# Patient Record
Sex: Female | Born: 1935 | Race: White | Hispanic: No | Smoking: Former smoker
Health system: Southern US, Community
[De-identification: ages and names within clinical notes are randomized; demographics above are authoritative.]

## PROBLEM LIST (undated history)

## (undated) DIAGNOSIS — K219 Gastro-esophageal reflux disease without esophagitis: Secondary | ICD-10-CM

## (undated) DIAGNOSIS — E785 Hyperlipidemia, unspecified: Secondary | ICD-10-CM

## (undated) DIAGNOSIS — F32A Depression, unspecified: Secondary | ICD-10-CM

## (undated) DIAGNOSIS — M1990 Unspecified osteoarthritis, unspecified site: Secondary | ICD-10-CM

## (undated) DIAGNOSIS — F329 Major depressive disorder, single episode, unspecified: Secondary | ICD-10-CM

## (undated) DIAGNOSIS — M8580 Other specified disorders of bone density and structure, unspecified site: Secondary | ICD-10-CM

## (undated) HISTORY — DX: Unspecified osteoarthritis, unspecified site: M19.90

## (undated) HISTORY — DX: Other specified disorders of bone density and structure, unspecified site: M85.80

## (undated) HISTORY — DX: Major depressive disorder, single episode, unspecified: F32.9

## (undated) HISTORY — DX: Hyperlipidemia, unspecified: E78.5

## (undated) HISTORY — DX: Depression, unspecified: F32.A

## (undated) HISTORY — DX: Gastro-esophageal reflux disease without esophagitis: K21.9

---
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
## 1987-08-19 HISTORY — PX: FINGER SURGERY: SHX640

## 1998-04-30 ENCOUNTER — Other Ambulatory Visit: Admission: RE | Admit: 1998-04-30 | Discharge: 1998-04-30 | Payer: Self-pay | Admitting: Family Medicine

## 1998-06-11 ENCOUNTER — Ambulatory Visit (HOSPITAL_COMMUNITY): Admission: RE | Admit: 1998-06-11 | Discharge: 1998-06-11 | Payer: Self-pay | Admitting: *Deleted

## 1998-06-29 ENCOUNTER — Ambulatory Visit (HOSPITAL_COMMUNITY): Admission: RE | Admit: 1998-06-29 | Discharge: 1998-06-29 | Payer: Self-pay | Admitting: *Deleted

## 1998-12-19 ENCOUNTER — Ambulatory Visit (HOSPITAL_COMMUNITY): Admission: RE | Admit: 1998-12-19 | Discharge: 1998-12-19 | Payer: Self-pay | Admitting: *Deleted

## 1999-08-27 ENCOUNTER — Encounter: Payer: Self-pay | Admitting: Family Medicine

## 1999-08-27 ENCOUNTER — Encounter: Admission: RE | Admit: 1999-08-27 | Discharge: 1999-08-27 | Payer: Self-pay | Admitting: Family Medicine

## 2000-08-27 ENCOUNTER — Encounter: Admission: RE | Admit: 2000-08-27 | Discharge: 2000-08-27 | Payer: Self-pay | Admitting: Family Medicine

## 2000-08-27 ENCOUNTER — Encounter: Payer: Self-pay | Admitting: Family Medicine

## 2001-02-24 ENCOUNTER — Ambulatory Visit (HOSPITAL_COMMUNITY): Admission: RE | Admit: 2001-02-24 | Discharge: 2001-02-24 | Payer: Self-pay | Admitting: *Deleted

## 2001-02-24 ENCOUNTER — Encounter (INDEPENDENT_AMBULATORY_CARE_PROVIDER_SITE_OTHER): Payer: Self-pay | Admitting: Specialist

## 2001-09-01 ENCOUNTER — Other Ambulatory Visit: Admission: RE | Admit: 2001-09-01 | Discharge: 2001-09-01 | Payer: Self-pay | Admitting: Family Medicine

## 2001-09-08 ENCOUNTER — Encounter: Admission: RE | Admit: 2001-09-08 | Discharge: 2001-09-08 | Payer: Self-pay | Admitting: Family Medicine

## 2001-09-08 ENCOUNTER — Encounter: Payer: Self-pay | Admitting: Family Medicine

## 2002-08-18 HISTORY — PX: CERVICAL FUSION: SHX112

## 2002-09-02 ENCOUNTER — Other Ambulatory Visit: Admission: RE | Admit: 2002-09-02 | Discharge: 2002-09-02 | Payer: Self-pay | Admitting: Family Medicine

## 2002-09-20 ENCOUNTER — Encounter: Admission: RE | Admit: 2002-09-20 | Discharge: 2002-09-20 | Payer: Self-pay | Admitting: Family Medicine

## 2002-09-20 ENCOUNTER — Encounter: Payer: Self-pay | Admitting: Family Medicine

## 2004-01-24 ENCOUNTER — Encounter: Admission: RE | Admit: 2004-01-24 | Discharge: 2004-01-24 | Payer: Self-pay | Admitting: Family Medicine

## 2005-02-28 ENCOUNTER — Encounter: Admission: RE | Admit: 2005-02-28 | Discharge: 2005-02-28 | Payer: Self-pay | Admitting: Family Medicine

## 2005-07-07 ENCOUNTER — Encounter: Admission: RE | Admit: 2005-07-07 | Discharge: 2005-07-07 | Payer: Self-pay | Admitting: Specialist

## 2005-07-08 ENCOUNTER — Inpatient Hospital Stay (HOSPITAL_COMMUNITY): Admission: RE | Admit: 2005-07-08 | Discharge: 2005-07-10 | Payer: Self-pay | Admitting: Specialist

## 2006-02-10 ENCOUNTER — Ambulatory Visit (HOSPITAL_COMMUNITY): Admission: RE | Admit: 2006-02-10 | Discharge: 2006-02-10 | Payer: Self-pay | Admitting: Specialist

## 2006-02-10 ENCOUNTER — Encounter (INDEPENDENT_AMBULATORY_CARE_PROVIDER_SITE_OTHER): Payer: Self-pay | Admitting: *Deleted

## 2006-03-02 ENCOUNTER — Encounter: Admission: RE | Admit: 2006-03-02 | Discharge: 2006-03-02 | Payer: Self-pay | Admitting: Family Medicine

## 2006-04-02 ENCOUNTER — Encounter: Admission: RE | Admit: 2006-04-02 | Discharge: 2006-04-02 | Payer: Self-pay | Admitting: Family Medicine

## 2006-06-16 ENCOUNTER — Encounter: Admission: RE | Admit: 2006-06-16 | Discharge: 2006-06-16 | Payer: Self-pay | Admitting: Family Medicine

## 2006-12-17 ENCOUNTER — Encounter: Admission: RE | Admit: 2006-12-17 | Discharge: 2007-01-13 | Payer: Self-pay | Admitting: Internal Medicine

## 2007-08-19 HISTORY — PX: SPINE SURGERY: SHX786

## 2007-08-20 ENCOUNTER — Ambulatory Visit: Payer: Self-pay | Admitting: Surgery

## 2007-08-20 ENCOUNTER — Ambulatory Visit (HOSPITAL_COMMUNITY): Admission: RE | Admit: 2007-08-20 | Discharge: 2007-08-20 | Payer: Self-pay | Admitting: Family Medicine

## 2007-11-04 ENCOUNTER — Encounter: Admission: RE | Admit: 2007-11-04 | Discharge: 2007-11-04 | Payer: Self-pay | Admitting: Specialist

## 2007-12-21 ENCOUNTER — Ambulatory Visit: Payer: Self-pay | Admitting: Pulmonary Disease

## 2007-12-21 ENCOUNTER — Inpatient Hospital Stay (HOSPITAL_COMMUNITY): Admission: RE | Admit: 2007-12-21 | Discharge: 2007-12-28 | Payer: Self-pay | Admitting: Specialist

## 2007-12-22 ENCOUNTER — Encounter (INDEPENDENT_AMBULATORY_CARE_PROVIDER_SITE_OTHER): Payer: Self-pay | Admitting: Specialist

## 2008-06-09 ENCOUNTER — Encounter: Admission: RE | Admit: 2008-06-09 | Discharge: 2008-06-09 | Payer: Self-pay | Admitting: Family Medicine

## 2008-07-17 ENCOUNTER — Encounter (INDEPENDENT_AMBULATORY_CARE_PROVIDER_SITE_OTHER): Payer: Self-pay | Admitting: Family Medicine

## 2008-07-17 ENCOUNTER — Ambulatory Visit (HOSPITAL_COMMUNITY): Admission: RE | Admit: 2008-07-17 | Discharge: 2008-07-17 | Payer: Self-pay | Admitting: Family Medicine

## 2008-07-17 ENCOUNTER — Ambulatory Visit: Payer: Self-pay | Admitting: Surgery

## 2008-08-22 ENCOUNTER — Encounter: Admission: RE | Admit: 2008-08-22 | Discharge: 2008-11-20 | Payer: Self-pay | Admitting: Specialist

## 2009-01-16 ENCOUNTER — Encounter: Admission: RE | Admit: 2009-01-16 | Discharge: 2009-01-16 | Payer: Self-pay | Admitting: Family Medicine

## 2009-08-01 ENCOUNTER — Encounter: Admission: RE | Admit: 2009-08-01 | Discharge: 2009-08-01 | Payer: Self-pay | Admitting: Family Medicine

## 2009-08-01 LAB — HM MAMMOGRAPHY: HM Mammogram: NORMAL

## 2010-06-12 HISTORY — PX: COLON SURGERY: SHX602

## 2010-06-12 LAB — HM COLONOSCOPY: HM Colonoscopy: NORMAL

## 2010-06-26 ENCOUNTER — Encounter: Admission: RE | Admit: 2010-06-26 | Discharge: 2010-06-26 | Payer: Self-pay | Admitting: Internal Medicine

## 2010-07-29 IMAGING — MG MM DIGITAL SCREENING BILAT W/ CAD
4 series · 4 of 4 positions shown · non-contrast
Comparison: none

DG SCREEN MAMMOGRAM BILATERAL
Bilateral CC and MLO view(s) were taken.
Technologist: Prajwal Traylor

DIGITAL SCREENING MAMMOGRAM WITH CAD:
The breast tissue is heterogeneously dense.  No masses or malignant type calcifications are 
identified.  Compared with prior studies.

[R CC]
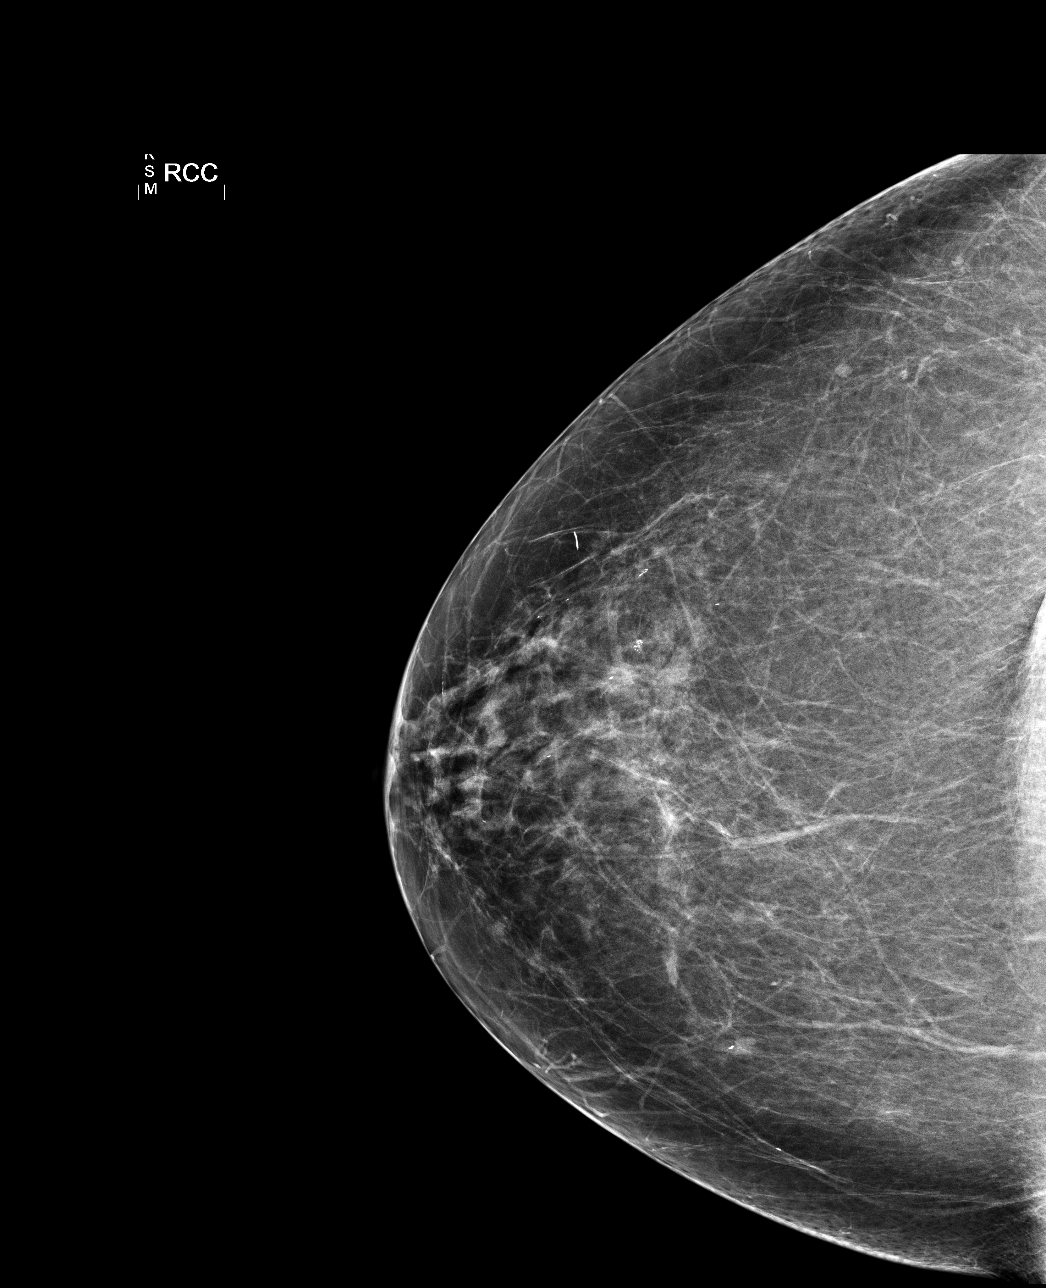

[L CC]
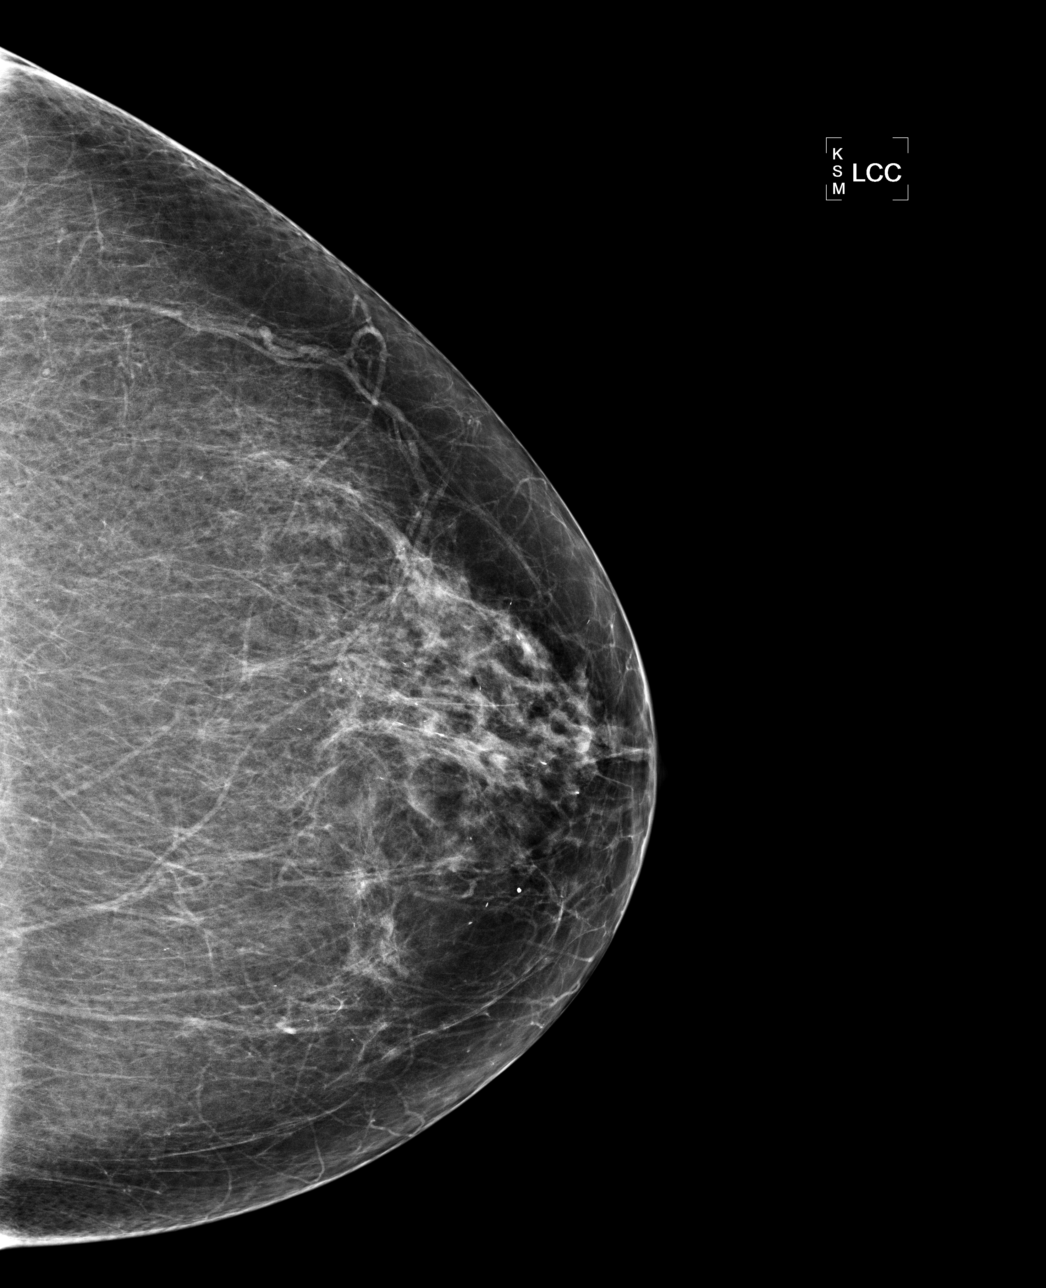

[L MLO]
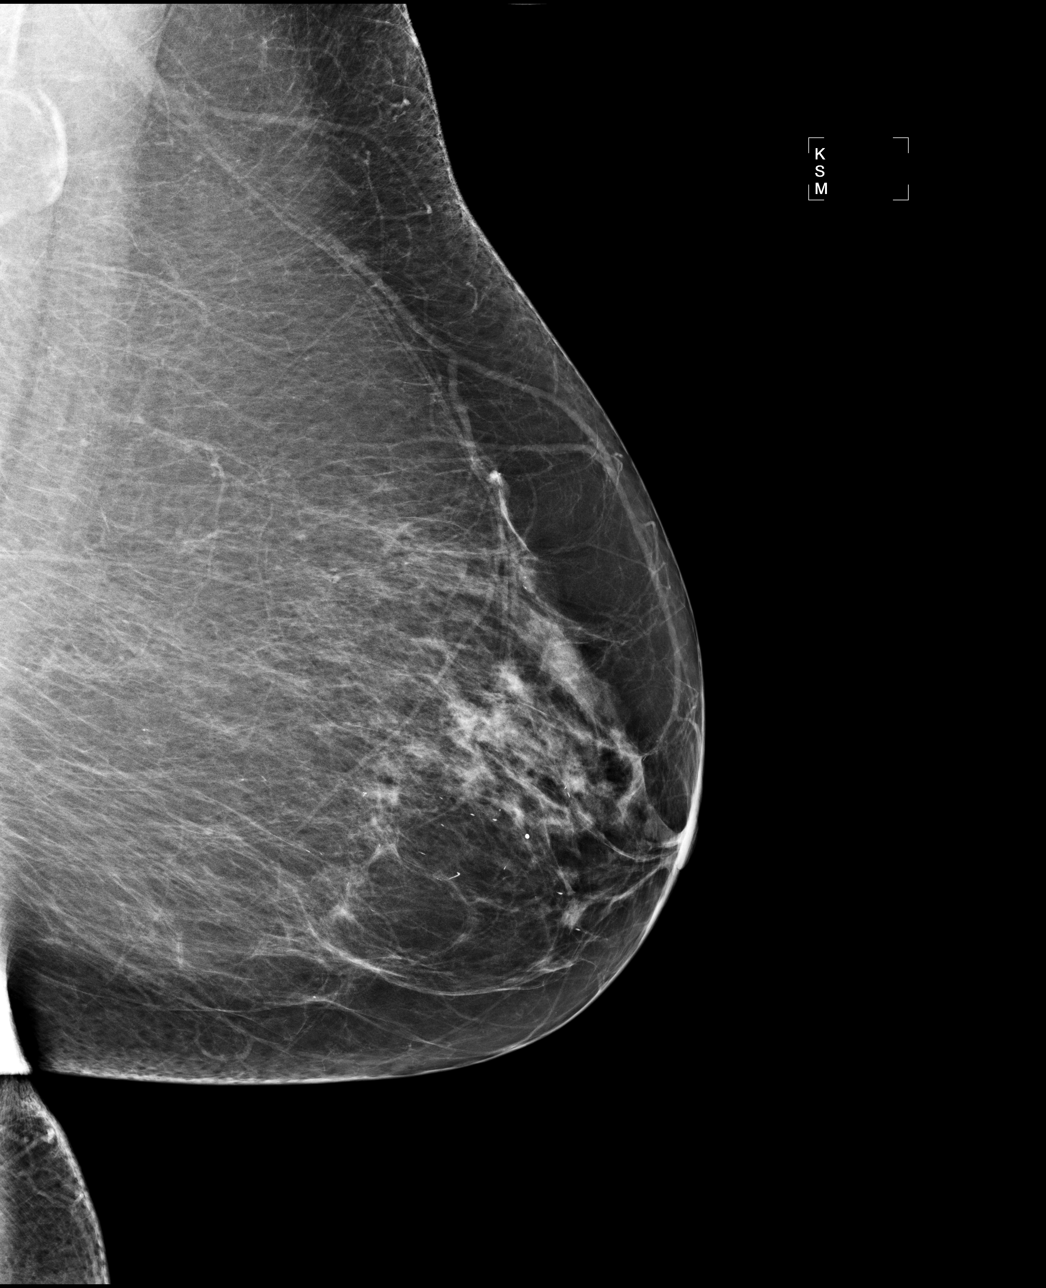

[R MLO]
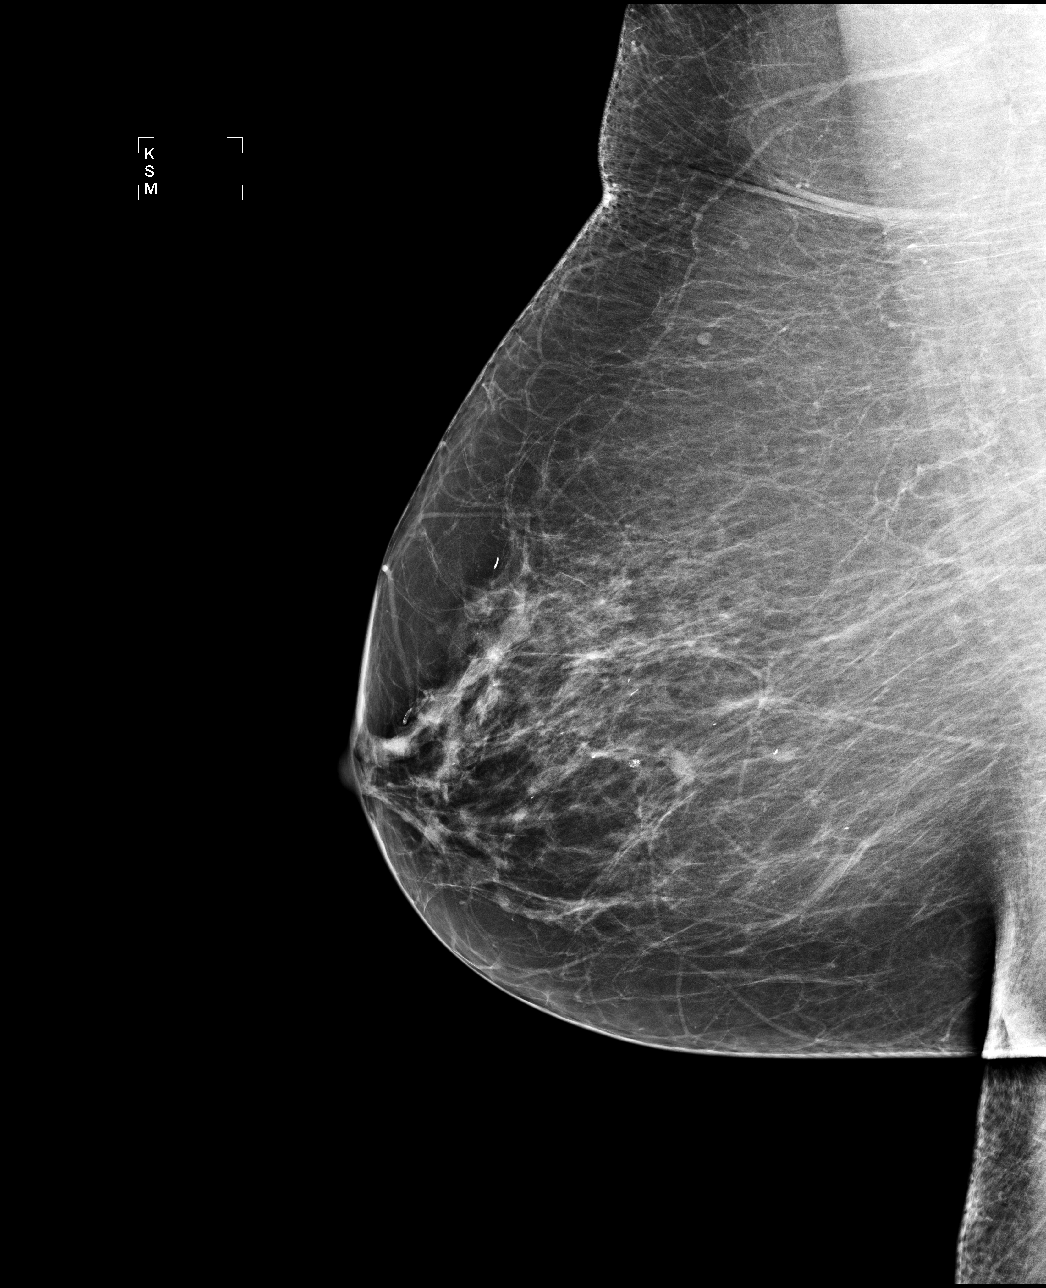

[4 of 4 positions shown; findings below may reference images not displayed]

IMPRESSION: No specific mammographic evidence of malignancy.  Next screening mammogram is recommended in one 
year.

ASSESSMENT: Negative - BI-RADS 1

Screening mammogram in 1 year.
ANALYZED BY COMPUTER AIDED DETECTION. , THIS PROCEDURE WAS A DIGITAL MAMMOGRAM.

## 2010-09-09 ENCOUNTER — Other Ambulatory Visit: Payer: Self-pay | Admitting: Internal Medicine

## 2010-09-09 DIAGNOSIS — Z1239 Encounter for other screening for malignant neoplasm of breast: Secondary | ICD-10-CM

## 2010-09-19 ENCOUNTER — Ambulatory Visit
Admission: RE | Admit: 2010-09-19 | Discharge: 2010-09-19 | Disposition: A | Discharge status: Home / Self Care | Payer: Self-pay | Source: Ambulatory Visit | Attending: *Deleted | Admitting: *Deleted

## 2010-09-19 DIAGNOSIS — Z1239 Encounter for other screening for malignant neoplasm of breast: Secondary | ICD-10-CM

## 2010-12-06 ENCOUNTER — Other Ambulatory Visit: Payer: Self-pay | Admitting: Family Medicine

## 2010-12-06 DIAGNOSIS — Z1239 Encounter for other screening for malignant neoplasm of breast: Secondary | ICD-10-CM

## 2010-12-31 NOTE — Op Note (Signed)
NAMELARENA, OHNEMUS                  ACCOUNT NO.:  [id number]   MEDICAL RECORD NO.:  [id number]          PATIENT TYPE:  INP   LOCATION:  3103                         FACILITY:  [facility]   PHYSICIAN:  Kerrin Champagne, M.D.   DATE OF BIRTH:  1927/01/29   DATE OF PROCEDURE:  12/21/2007  DATE OF DISCHARGE:                               OPERATIVE REPORT   PREOPERATIVE DIAGNOSES:  Lumbar spinal stenosis, neurogenic claudication  secondary to foraminal entrapment right side greater than left, L5-S1  with degenerative spondylolisthesis, right-sided lateral recess stenosis  L4-L5 with degenerative spondylolisthesis.   POSTOPERATIVE DIAGNOSIS:  Lumbar spinal stenosis, neurogenic  claudication secondary to foraminal entrapment right side greater than  left, L5-S1 with degenerative spondylolisthesis, right-sided lateral  recess stenosis L4-L5 with degenerative spondylolisthesis.   PROCEDURES:  Central decompressive laminectomy at L4-5 and L5-S1,  transforaminal lumbar interbody fusion, right side L4-L5 with 9-mm  Concorde cage and local bone graft transforaminal lumbar interbody  fusion, right side L5-S1 with 8-mm cage and local bone graft.  Both  cages were lordotic Concorde cages.  Posterolateral fusion at L4 to S1  using combination with local bone graft and Vitoss material.  With  instrumentation utilizing the DePuy Monarch pedicle screws and rods from  L4 to S1 two segments.  Left L5 bone marrow aspirate.   SURGEON:  Kerrin Champagne, MD   ASSISTANT:  Wende Neighbors, PA-C   ANESTHESIA:  General via orotracheal intubation by Dr. Arta Bruce,  local infiltration with Marcaine 10 mL with 1:200,000 epinephrine.   SPECIMENS:  None.   ESTIMATED BLOOD LOSS:  600 mL.   RETURNED BLOOD:  Two units of cell-saver blood greater than 60%  hematocrit.   COMPLICATIONS:  None.   DRAINS:  Foley is straight drain, Hemovac x1, right lower lumbar spine.  Intraoperative neuromonitoring indicating soft  tissue resistant at 18 on  the left L4 level.  This is a level that previously had broach of the  transverse process, so with initial entry point and likely this resulted  in decreased soft tissue resistance.  All the remaining screws were  greater than 21.  Intraoperative C-arm evaluation demonstrated all  screws in good position and alignment.   HISTORY OF PRESENT ILLNESS:  A 77 year old female who has been  experiencing increasing neurogenic claudication with standing,  ambulation, unable to stand any length of time, greater than 5-10  minutes, unable to walk any distance greater than 50 to 100 feet,  persistence of pain in her back, radiation into her legs.  She has  attempted therapies.  She has attempted medications without relief of  pain.  She wishes to improve her current quality of life.  She has  undergone preoperative evaluation and was felt to be adequate candidate  for surgical intervention.  Intraoperative findings as above.   DESCRIPTION OF PROCEDURE:  After adequate general anesthesia, the  patient had Foley catheter placed.  She had intraoperative  neuromonitoring leads placed.  She was then placed in a prone position  using the [place] spinal frame.  She had standard prep with  DuraPrep  solution, was draped in the usual manner.  Standard preoperative  antibiotics with vancomycin for penicillin allergy.   The patient had PAS stockings, both lower extremities to prevent  intraoperative deep venous thrombosis.  The patient's initial incision  was made and inspected at L3 level extending to the sacrum at S2 in the  midline through the skin or subcutaneous layers using a 10 blade scalpel  after infiltration with Marcaine 0.5%  with 1:200,000 epinephrine.  The  incision was carried sharply through the skin and subcu layers directly  down to the lumbodorsal fascia.  This was then incised in the midline  over the spinous process of L3, L4, L5, S1, and S2.  Cobb elevators  were  then used to elevate her lumbar muscles both sides off the posterior  elements extending from L3 to sacrum and S2 on both sides.  Bleeders  controlled using electrocautery and bipolar electrocautery.  The  exposure continued out over the lateral aspects of the facets of the L3,  L4, L5, and at the sacral level exposing the sacral ala bilaterally, the  transverse process at L5 and at L4, and also continuing up exposing the  facet at the L2-L3 level in order to allow for adequate lateral exposure  for placement of pedicle screws.   Intraoperative radiographs obtained with clamps on this spinous process  of S1 and of L5.  The clamps were radiographically noted to be on the L5  and the L4 spinous processes.  These areas were marked for continued  identification.  The lateral gutters were packed with sponges following  the exposure of the transverse processes of each level L4, L5, and S1  bilaterally.   Leksell rongeur then used to excise the spinous process of L5 and of L4,  inferior 40% of the spinous process of L3.  The central portions of the  lamina were then carefully pinned using Leksell rongeur.  Osteotomes  were then used to perform partial facetectomies on the left side,  complete facetectomies on the right side of L5-S1 level and L4-L5 level  on the right side, a complete facetectomy and on the left side partial  facetectomy.  This bone was later preserved for bone grafting purposes.  Central laminectomy was then performed using 3 and 4-mm Kerrison.  Loupe  magnification and a head lamp was used.  Care was taken to protect dural  sac during this portion of the procedure.  The central portion of some  lamina at L5 were excised up to the L4-L5 level, and this was then  carefully freed off from the thecal sac and the remaining portion of the  neural arch resected at the L4-L5 level.  This was then continued up  superiorly to the L3-L4 level resecting the central portions of  lamina  of L4 up to the L3-L4 level.  The lateral recesses were then  decompressed using 3 and 4-mm Kerrisons, winding the central  laminectomy.  The pars area was then resected on the right side at the  L4 and the L5 levels, in order to fully decompress the neuroforamen on  the right side, both the L5 nerve root and the L4 nerve roots.   The ligamentum flavum was then resected centrally at L5-S1 and off the  superior aspect of lamina of S1, and then this was then carried out to  the neuroforamen both sides at the S1 level and performing a  foraminotomy both sides of S1, decompressing this nerves, locating  the  medial aspect of the pedicle at the S1 level both sides and on the right  side, continuing the lateral recess decompression up the right side, and  resecting the right side medial portion of superior articular process of  S1.  Remaining portions of the superior aspect of S1 facet was then  resected about the superior portion of the pedicle of S1 on the right  further completing decompression of right L5 nerve root.  Lateral  recesses were decompressed at the L4-L5 level and this was continued  out at the L5 neuroforamen using two 3-mm Kerrisons and osteotomes were  necessary.  Carefully exposing the L5 nerve root and decompressing this,  it  is found to be quite edematous, quite erythematous secondary to  dilatation of vessels.  Following decompression, the nerve root was in  good condition.  The L4 nerve root was decompressed within its  neuroforamen, but resection of the superior articular process of L5, and  the intraarticular process of L4 on the right side after performing  lateral recess decompression.  Distal thecal sac in both L4 and L5 nerve  roots exiting out without further nerve compression and acoustic neural  probe to be easily passed out from the neuroforamen on the left side as  well as the left S1 and the right S1 levels.  This completed the portion  of the  decompression.  The C-am fluoro was then brought into field.  Under C-arm fluoro then a starting awl was then used to make the initial  entry point along the lateral aspect of the intersection of the lateral  portion of pedicle of L4 and the transverse process of L4.  Initial  attempts at placement of an opening into the pedicle were unsuccessful  and was probed laterally.  This required use of a burr then to remove  the small portion in the lateral aspect of the superior articular  process of L4 in order to obtain an entry point of the lateral aspect of  the pedicle at the L4 level.  Then using a handheld awl, we were able to  make an initial entry point and then using pedicle finder probed the  pedicle on the left at the L4 level.  With correct degree of convergence  then, tapping was performed, and this was then checked using a ball-tip  probe to ensure patency of the pedicle.  This proved to be present.  A  35 mm x 6.25 mm screws were then placed on all of the left side.  First,  on the left side at L4 tapping was appropriate using a high-speed bur  then decorticated the transverse process of L4 and then placing a 35 mm  x 6.25 mm screw here.  Next, an awl was used to make initial entry point  at the  intersection of the lateral aspect of the pedicle and the  intersection with the transverse process at L5, and then a handheld  pedicle probe was used to probe the pedicle, and this was done with  correct degree of convergence using C-arm fluoro for guidance.  This was  probed quite easily.  Measure of depth 35-mm length was chosen by 6.25.  Tapping was performed with a 5.5 tap.  Decortication of the transverse  process carried out with ball-tip probe,  used to carefully checked  patency of the opening into the pedicle at L5 on the left side.  Then  appropriate size screw 35 mm x 6.25 mm was  then placed on left side of  L5.  Noted that prior to placement of the screw, the bone marrow   aspirate was carried out using the aspiration equipment for the Vitoss,  a total of 10 mL, a bone marrow was aspirated from left side of L5.  This was applied to the Vitoss material.  Last screws then placed on the  left side of the S1 level using a starter awl at the inferior aspect of  the superior articular process of S1 just lateral to it, checking with a  C-arm fluoro, then using a pedicle finder to probe the pedicle to 30-mm  as there was only one 7.0 x 30-mm screw available.  Tapping was  performed with a 5.5 tap and a 6.25 x 30-mm screw was placed and this  obtained excellent purchase on the left side at S1.  Each of these  pedicle screws then had their fasteners carefully loosened using the  fastener lock breaker provided.     Attention then turned to the right side where similarly screws were  placed in the L4, L5, and S1 levels using a starter awl verifying of C-  arm fluoro.  Then using the pedicle probe, probed the pedicle  appropriately then using ball-tip probe at each level, we carefully  tested the patency of the entry point of the pedicles ensuring their  patency.  A 6.25 x 35-mm screws were placed above L4 and L5 on the right  side after decortication with transverse process with an application of  Vitoss material here.  Final screw placed on the right side of the S1  level.  Again, using a pedicle probe testing the pedicle patency using a  ball-tip probe.  A 30-mm x 7.0 screw was used.  Using a 6.25 tap and  placing appropriate sized screw, correct degree of convergence lordosis  as seen on C-arm.  With this then each of the screws have been placed.  These were then measured for soft tissue resistance at left L4 pedicle  and screw that had the most difficulty with placement that showed soft  tissue resistance of 18, remaining greater than 21.  These were checked  with C-arm fluoro in both AP and lateral planes demonstrating screws in  good position alignment.  With  this done, an irrigation was carried out.  Careful inspection of nerves demonstrated, there is no significant nerve  compression occurring at this point.  A single rod was then carefully  contoured using a 15-mm length, contoured rod that was further curved  for the end of fasteners on the left side.  This was then held in place  with caps at each of the 3 screws.  Right side, that was placed later in  the procedure.  Exposure obtained of the posterior aspect of the disk  space at L5-S1 level using a Penfield #4 mobilizing lateral thecal sac  in the S1 nerve root.  Cauterizing the epidural veins using bipolar  electrocautery.  A 15 blade scalpel used to incise the disk on the right  side, pituitary rongeurs used to debride the disk.  Dilatation of the  disk space carried out after first using an osteotome in order to remove  the small lip over the superior aspect of S1.  A 2 and 3-mm Kerrisons  were resected the small amount of bone and disk material.  An 8 mm  dilator was then used, 9 mm was unable to be turned within the disk  space,  so 8-mm choice of bone graft and a cage was chosen.  The disk  space was further opened using a 9-mm spreader across the spinous  process at L3 extending to the sacrum.  This allowed opening the disk  space, allowed for further debridement using straight curette, left  upbiting right curettes, and ring curettes were all used to debride the  disk space and disk material and cartilaginous end plates.  Pituitary  rongeurs were used to further debride the standard bleeding bone  surfaces.  Finally, a trail was performed using an 8 mm trough by the  excellent fit.  This bone graft that have been previously removed and  morselized was then placed within the intervertebral disk space at the  L5-S1 level.  This was further impacted then using the 8 mm trial  [place]. Bone graft was then placed into the 8 mm lordotic cage, and the  cage then placed in its correct  position converging towards the middle  aspect of the disk space over the central portion of the disk.  This was  then impacted into placed in a correct degree of convergence.  This was  observed on C-arm fluro to be in good position and alignment within the  anterior half of the disk space.  This was then released from the  insertion device.  Irrigation was performed with thrombin-soaked Gelfoam and Surgicel was then placed out lateral in order to control small  bleeders close to the extending L5 nerve root.  Attention then turned to  the right side of the L4-L5 level where again disk space carefully  exposed using Penfield #4 through [place].  Bleeders was  controlled using bipolar electrocautery.  Carefully mobilizing the  thecal sac at the L5 nerve root.  A 15 blade scalpel was used to incise  the disk on the right side, pituitary rongeur was used to debride the  disk material.  The intervertebral disk space was then carefully  debrided of the disk material first and then using curettes with the  laminar spreader then placed between the spinous process of L3 and the  sacrum.  Disk space was able to be debrided off the cartilaginous end-  plates, further degenerative disk material using straight upbiting,  right upbiting, left curettes as well as ring curettes.  Straight  pituitary rongeurs were used to debride the disk space and trailing  first 8 then 9, then a 10 mm trial.  A 10-mm trial could not be  inserted, a 9-mm did appear to be able to be inserted.  Dilatation of  the disk space have been carried out to 10-mm, but only a 9-mm trial was  able to be inserted, so 9 mm cage was chosen, this was packed with local  bone graft tissue and local bone graft was then placed in the  intervertebral disk space along the right side, carefully retracting the  thecal sac and L5 nerve root.  Then bone graft had been packed within  the disk space and the trial.  A 9 mm instrument was then  used to pack  the bone graft, packed 9-mm cage, was then carefully impacted into the  disk space in an oblique angle, the trial centralized.  Observed on the  C-arm fluro to be in good position along the central portions of the  disk space, subset beneath the posterior aspect of the disk space at L4-  L5.  Care was taken to ensure, there was no bone material within  the  spinal canal following the insertion of these cages both L5, S1, and L4-  5.  Hemostasis was obtained using Gelfoam with thrombin-soaked as well  as Surgicel.  This was then removed at the end of the case.  A 65-mm pre-  contoured rod was then carefully further contoured, and placed within  the pedicle screws, fasteners on the right side, and each of the  fastener caps were then placed.  First on the right side of the L4  level, rod was carefully positioned to ensure it extended beyond the  fastener and then the fastener was attached to the rod at 80 foot pounds  on the right.  Compression was obtained between the L4 and L5 pedicle  screw fasteners and the fastener attached to the rod on the right side  at 80 foot pounds.  Similarly, this carried out on the left side,  attaching the rod at the left L4 pedicle screw to the fastener to 80  foot pounds tightening the cap down and compressing between the L4 and  L5 fasteners then tightened the L5 fastener capped to 80 foot pounds.  On the right side, compressing the between the L5 and the S1 fasteners  and tightening the S1 fastener at 80 foot pounds on the right side.  Then performing similarly on the left side tightening and compressing  between the L5 fastener head and the S1 fastener, then tightening the S1  fastener at 80 foot pounds.  Thus completed the instrumentation portion  of the case.  Permanent C-arm images obtained in the AP and lateral  planes demonstrated excellent position and alignment of the screws and  rods, both sides.  Bone graft well placed centrally  above the L4-L5 and  L5-S1.  This portion of the procedure was completed and irrigation was  carried out, soft tissues were debrided, of devitalized tissue and  remaining local bone graft material, which was abandoned, was placed  posterolaterally extending from the transverse process at L4-L5 to the  sacral ala, both sides.  Careful evaluation of the laminectomy side  indicated both L4, L5 excellent nerve root decompression.  Soft tissue  was allowed to fall back in to the place.  A medium Hemovac drain was  placed through the incision on the right  lumbar level.  The soft  tissues reapproximated in the midline over the central alignment along  the defect.  Using #1 Vicryl sutures reapproximated paralumbar muscles  here.  Lumbodorsal fascia was reattached to the spinous process and a  spinous ligament extending from L3 to L2 centrally and at the S1 level  inferiorly.  Lumbodorsal fascia was reapproximated to itself, then the  midline with interrupted figure-of-8 simple sutures of #1 Vicryl.  The  subcu layers approximated with interrupted #1 Vicryl and superficial  layers with interrupted 0 and then 2-0 Vicryl sutures.  Skin closed with  a running subcu stitch of 4-0 Vicryl.  Dermabond was then applied to the  skin edges closing the skin.  A 4 x 4 fixed to the skin with Hypafix  tape.  The patient was then returned to supine position,  reactivated,extubated, returned to recovery room in satisfactory  condition.  All instruments and sponge counts were correct.  Noted,  preoperatively, the patient had Marcaine performed on lower lumbar  spine.  She also underwent standard intraoperative time-out procedure.  Identifying the patient with doctors and assistants present.  Procedure,  TLIF lumbar interbody fusion, right side at L4-L5 and L5-S1  and  decompression.      Kerrin Champagne, M.D.  Electronically Signed     JEN/[id number]  D:  12/21/2007  T:  12/22/2007  Job:  [id number]

## 2010-12-31 NOTE — Consult Note (Signed)
[name], [name]                  ACCOUNT NO.:  [id number]   MEDICAL RECORD NO.:  [id number]          PATIENT TYPE:  INP   LOCATION:  3103                         FACILITY:  [facility]   PHYSICIAN:  [name], M.D.DATE OF BIRTH:  Jan 10, 1927   DATE OF CONSULTATION:  12/21/2007  DATE OF DISCHARGE:                                 CONSULTATION   The patient's PCP is Dr. Dorothe Carter of [place].   REASON FOR CONSULTATION:  Hypotension.   HISTORY OF PRESENT ILLNESS:  The patient is a 77 year old white female  with past medical history of hypertension, glaucoma, and recent DVT  treated from January to March with Coumadin who underwent today a  decompression by Dr. Otelia Carter. The patient preoperative was admitted for  this procedure secondary to lower extremity pain and weakness.  Preoperative labs for this patient noted that the patient had a  hemoglobin of 11 which was done on December 15, 2007 and otherwise normal  labs. During the procedure, she had 600 mL of blood loss, but during the  procedure, they noted that her urine output by Foley had tapered off.  She was started on fluid, and by the time she got out of the OR, she was  noted to be hypotensive with systolic in the 80s. Despite attempts with  IV fluids and blood, her pressure continued dropping. She was started on  IV dopamine. At this point, [name] were consulted. She  initially was on 3 mcg of dopamine but required titration up. By the  time they got to 10, her heart rate had increased into the 150s and  160s. I spoke with Dr. Otelia Carter as well as PACU nurse, and we started  Levophed as well, with a wean up on her Levophed and a wean down on her  dopamine. Attempt was made to completely wean off the dopamine, and her  pressure dropped further. The patient herself was actually fully awake  and alert and post procedure had only received 2.5 of Dilaudid but had  not had this for several hours. Labs were ordered on patient, and  she  was found to have at 1:45 p.m. a hemoglobin of 9.4, six hours later,  cardiac markers were ordered as well, and she was found to have a CPK of  907 and MB of 25.6 but a normal troponin. At this point now, with her  Levophed running at 20 and her dopamine running at 7, blood pressure  check was found to be 66/27. However, this was with a manual cuff. At  this point, I spoke with Dr. Sung Carter of critical care medicine who was  covering for [facility] tonight. After recheck of her blood pressure while I  was speaking with him, her blood pressure had been found to improve with  systolic in the 150s/70s. At this point, she is felt to be slightly more  stable. Dr. Sung Carter agrees that she needs critical care intervention,  especially with IV access. The patient has peripheral IV but no central  line. The patient was then transferred to the ICU for close monitoring.  Upon arrival to the ICU, her Levophed was titrated down to 15, her  dopamine down to 3 mcg. Her pressure is now down into the 120s systolic.  She is shivering quite a bit and complaining of being cold, and her  heart rate is difficult to assess. It appears to because of her  shivering more increased than it actually is and is basically around  close to 90.   The patient subsequent to that, complained of some minor lower back  pain, and other than just being cold and some minor lower back pain, she  is doing okay. She denies any headaches, vision changes, dysphagia. No  chest pain or palpitations. No shortness of breath, wheezing or  coughing. No abdominal pain. No constipation or diarrhea. She has  problems with bilateral lower extremity weakness and numbness. Her  review of systems is otherwise unremarkable.   The patient's past medical history includes:  1. Glaucoma.  2. GERD.  3. Depression.  4. She is status post a cervical laminectomy in the past. She is      status post lumbar decompression today.   MEDICATIONS:  She is  on:  1. Cosopt 1 drop each eye b.i.d.  2. Fluoxetine 20 daily.  3. Vytorin 10/40 p.o. daily.  4. Protonix 40 p.o. daily.  5. Quinapril/hydrochlorothiazide 20/12.5 one-half tablet p.o. daily.  6. Neurontin 100 to 150 mg t.i.d.  7. Xalatan 1 drop b.i.d. daily.   THE PATIENT HAS ALLERGY TO PENICILLIN.   SOCIAL HISTORY:  The patient has no tobacco, alcohol or drug use.   FAMILY HISTORY:  Noncontributory.   PHYSICAL EXAMINATION:  The patient's vitals currently:  She has a heart  rate of what appears to be around 90, although because of her shivering  is higher than this. Her blood pressure is 127/72, respirations 20,  afebrile.  GENERAL:  She is alert and oriented x3 in no apparent distress.  HEENT:  Normocephalic, atraumatic. Mucous membranes are dry. She has no  carotid bruits.  HEART:  Irregular rate and rhythm, S1 and S2. Mild 2/6 systolic ejection  murmur.  LUNGS:  Decreased breath sounds throughout.  ABDOMEN:  Soft, obese, nontender, positive bowel sounds.  EXTREMITIES:  Show no clubbing or cyanosis. Trace pedal edema. She has  PAS hose on.  She has minimal urine output.   LABORATORY WORK:  Cardiac panel done at 145 shows CPK 907, MB 25.6,  troponin I 0.01. Hemoglobin and hematocrit done at 1:45 a.m. notes a  hemoglobin of 9.4, hematocrit 29. Preoperative screening labs are  essentially unremarkable done on December 15, 2007.   ASSESSMENT AND PLAN:  1. Hypotension. Differential diagnoses of volume loss versus decreased      output from heart failure versus respiratory system impairment,      possible pulmonary embolism, versus anesthesia. She is currently on      Levophed and dopamine. I suspect that this is multifactorial and      probably is from anesthesia which is started to wear off in      addition to volume loss. She is on Levophed and dopamine. We will      wean off the dopamine, continue IV fluids, check labs and chest x-      ray, CCM to see for central line  placement and further pressor      recommendations.  2. History of pulmonary embolism. She only received three months of      treatment, and this is an outside possibility,  but with the timing,      I do not think this is necessarily related to a pulmonary embolus.  3. Glaucoma. Continue her eye drops.  4. Hypertension, history of. Holding her medications.  5. Depression. Continue medications.  6. Status post lumbar decompression.      [name], M.D.  Electronically Signed     SKK/MEDQ  D:  12/21/2007  T:  12/21/2007  Job:  [id number]   cc:   Kerrin Champagne, M.D.  Jethro Bastos, M.D.

## 2011-01-03 NOTE — Op Note (Signed)
[name], [name]                  ACCOUNT NO.:  [id number]   MEDICAL RECORD NO.:  [id number]          PATIENT TYPE:  INP   LOCATION:  NA                           FACILITY:  [facility]   PHYSICIAN:  Kerrin Champagne, M.D.   DATE OF BIRTH:  02/12/1927   DATE OF PROCEDURE:  07/08/2005  DATE OF DISCHARGE:                                 OPERATIVE REPORT   PREOPERATIVE DIAGNOSIS:  Central cervical stenosis C3-4 and C4-5 with  minimal gliosis changes. Anterior listhesis at the C3-4 level. Bilateral  upper extremity radiculopathies in a C5 distribution with biceps weakness  and shoulder abduction weakness.   POSTOPERATIVE DIAGNOSIS:  Central cervical stenosis C3-4 and C4-5 with  minimal gliosis changes. Anterior listhesis at the C3-4 level. Bilateral  upper extremity radiculopathies in a C5 distribution with biceps weakness  and shoulder abduction weakness.   PROCEDURE:  Anterior cervical diskectomy and fusion with decompression of  the cervical canal and C3-4 and C4-5 with right iliac crest bone graft and  allograft fibula bone graft composite x2 8 mm height with 37 mm plate  fixation and 14 mm screws in both the C4 and C5 levels, 12 mm screws at the  C3 level.   SURGEON:  Kerrin Champagne, M.D.   ASSISTANT:  Maud Deed, [facility].   ANESTHESIA:  General via oral tracheal intubation with Dr. Sampson Goon.   SPECIMENS:  None.   ESTIMATED BLOOD LOSS:  150 mL.   COMPLICATIONS:  None.   DRAIN:  A 7-French TLS drain anterior neck. Foley to straight drain.   BRIEF CLINICAL HISTORY:  This patient is a 77 year old female who has been  experiencing increasing numbness both upper extremities, increasing weakness  in both arms with feeling of the arms being heavy. Presented to  Dr. Cindee Salt with concerns of carpal tunnel syndrome.  Radiographs demonstrated  anterior listhesis of C3 on C4 and MRI study subsequently demonstrated  cervical stenosis at both C3-4 and C4-5, greater than other segments  with  some findings consistent with early gliosis. She is brought to the operating  room to undergo anterior diskectomy and fusion at both the C3-4 and C4-5  levels for cervical stenosis with early myelopathic changes seen on MRI  study. She also has findings of disk degeneration and uncinate process  hypertrophy at the C5-6 level, however, this does not appear to cause any  significant cervical stenosis. The patient is brought to surgery for  decompression of cervical stenosis primarily.   INTRAOPERATIVE FINDINGS:  Central canal stenosis C3-4 and C4-5.   DESCRIPTION OF PROCEDURE:  After adequate general anesthesia, the patient in  beach-chair position, a bump under the right iliac crest, the neck in slight  extension with a towel rolled and placed between her shoulders. The [place]  horseshoe was used, well-padded, to hold her head in place. The slides were  used for both forearms padding the elbows appropriately and all soft tissue  areas appropriately. She had bilateral TED hose placed to prevent DVT.  Standard prep with DuraPrep solution over the anterior neck and right iliac,  dried draped in the usual manner, iodine Vi-Drape was used. Incision over  the left neck at the expected C4 level through skin and subcutaneous layers  in line with this patient's skin creases, the incision approximately 4.5 cm  in length. This was carried through skin and subcu layers down to the  platysma layer. Bleeders controlled using electrocautery. Platysma layer  then incised with  electrocautery. The deep fascia then spread using Mayo  scissors. The omohyoid muscle identified and then divided using Mayo  scissors. The interval between the trachea and esophagus medially and  carotid sheath laterally then developed bluntly using finger dissection to  the anterior aspect of cervical spine and the C4-C3 levels.   Kitner dissector was then used to carefully tease the prevertebral fascia to  the midline.  Electrocautery then used to incise the fascia along the medial  border of the longus colli muscle and this was then teased across midline.  Needles were then placed at the expected C3-4 and C4-5 levels.  Intraoperative radiograph demonstrated the needles at the C2-3 and C3-4  levels. Based on this, the upper needle was removed. Then under direct  visualization using the handheld retractors, as with the first needle  removal, the second needle was removed; however, the second needle at the C3-  4 level was removed and a portion of disk excised using a 15 blade scalpel  for continued identification of this level. Then returning to anterior  aspect of the cervical spine exposure of the lowest segment beneath the C3-4  level identified was then made using a 15 blade scalpel.   The 15 blade scalpel was used to incise the disc anteriorly. Pituitary  rongeur then used to excise the disk. Anteriorly for continued  identification of the C4-5 level as well. Medial border of the longus colli  muscle then carefully freed up using electrocautery both sides. Then a Boss  retractor was placed, the medial border of the retractor placed beneath the  medial border of the longus colli muscle on both sides, distraction and  exposing the C4-5 segment initially.   The 14 mm screw posts were then placed in the vertebral body at the C5 and  C4 levels. Distraction obtained across the disk space under Loupe  magnification headlamp then high-speed bur was then used to remove the  inferior end plate of Z6-1 and the superior end plate of C5 back to the  posterior lip osteophytes and posterior cortex of the C5-C4 vertebral bodies  at the level of the disk space and about 2-3 mL superior and inferior to the  disk space. Next, the bur was then used carefully thin the posterior lip  osteophytes and these were then resected with 1 mm Kerrisons both superior to the disk space and end plate area as well as the inferior to  it across an  area that was less stenotic, decompressing the central canal back to the  posterior longitudinal ligament. Next, further the edges of the vertebral  bodies were further decompressed using 1 mm Kerrison over the posterior lip  that remained. And then the foraminotomies performed on the right side and  left side of the C5 level decompressing the C5 nerve roots out to the area  where the nerve root was proceeding anteriorly. This decompressed the nerve  roots quite nicely. The nerve roots were noted to be exiting anteriorly well  beyond the foramen at this point. Height of the intervertebral disk space  was then measured using a  sounder of 8 mm. Depth measuring approximately 18  mm. Right iliac crest bone graft harvest site was exposed while radiograph  was being developed with the cervical spine for localization, an incision  was made over the right iliac crest after infiltration with Marcaine 0.5%  with 1:200,000 epinephrine. Incision through skin and subcu layers carried  down through the superficial portion of the right iliac crest  anterolaterally about four fingerbreadths posterior to the anterior superior  iliac spine.  Electrocautery then used to perform subperiosteal dissection  exposing the superior, medial and lateral aspects of the iliac crest  carefully retracting the structures, then a 8 mm dual oscillating saw was  used to incise the graft from the crest. In the process of then removing  this by cutting across it base, the graft noted to be entirely too small in  its length.  Therefore a second portion of the graft was taken.  The quality  the graft was felt to be significantly osteoporotic difficult to work with.  An using the 8 mm dual oscillating saw, the graft width appeared to be too  narrow as well. Decision was made to use a composite graft with fibula  material that would allow for an excellent strut of the disk space as well  as area for placement  cancellous bone graft for effusion technique. In  essence using the fibular graft as a strut with a consistency of like that  of a cage. The 8 mL fibula was chosen. This was packed with cancellus  material obtained from the bone graft harvested from the right iliac crest.  This was then impacted into place over the anterior aspect cervical spine at  the C4-5 level. Distraction was then released by removing the C5 screw post,  coating the area here for hemostasis with bone wax. The Boss retractor was  then removed and then returned to the C3-4 level placed the medial border  longus colli muscle on both sides exposing the anterior aspect of the  cervical spine. A 15 blade scalpel used to incise further the disk that had  already been incised previously at C3-4 level. Pituitary rongeurs used to  debride the disk space, 3 mm Kerrison used to remove the anterior lip  osteophytes. The operating room microscope was draped brought into the field with the first level at the C4-5 level after initial debridement of the  anterior to thirds of the disk space. It was used for specifically excising  the posterior lip osteophytes as well as performing foraminotomies. This  microscope was used for the entire resection at the C3-4 level of the end  plates of I6-9 both the superior and inferior end plates all way back to the  posterior lip osteophytes and resected posterior lip osteophytes and  performing foraminotomy, using a high-speed bur for most of the procedure, 1  mm of 2 mm Kerrisons for foraminotomy and resection of the posterior lip  osteophytes and bone bridge here. With this then decompression of spinal  canal was complete at the C3-4 level. Additional fibula graft was chosen  after 8 mm sounder demonstrated that this filled the disk space quite nicely  and adequate height. Cancellus bone graft obtained from the right iliac  crest was used to fill the open portion of the fibula graft site. Graft  was  then impacted into place at the C3-4 level. Both screw posts were then  removed at C3 and C4 levels. Allowing for the distraction be removed.  Excellent  compression across both disc spaces noted. The length was then  measured using spread bayonet and ruler. A 37 mm plate eventually was  chosen. This was placed across the anterior aspect of cervical spine, 5  pound cervical halter traction was released. Initial stabilizing pen was  placed at the C4 level and the Lois screws were replaced first on the left  sided C5, fixing the plate in good sagittal alignment. The second screw on  the right side at  C5. These were 14 mm screws. Then the right side C4 screw  was placed again using 14 mm screw. Without difficulty. Retaining pin was  then removed and left C4 level. A 14 mm screw placed at this segment. At the  upper segment a 12 mm screw was used and this was used on the left side  first drilling with a 14 mm drill and then placing the 12 mm screw. On right-  sided C3, drilling with the 12 mL screw and then placing a additional screw  on the right side having to place a revision screw at that point. Following  this, it was felt that adequate compression and end plating was obtained  adequate fixation. Additional bone graft was placed at the C3-4, C4-5 levels  to further reinforce the graft that was already present using bone graft  from the iliac crest on the right side.  Intraoperative radiograph obtained  demonstrating plates and screws in good position alignment.   The patient then had a 7-French drain placed in the neck exiting out the  right side anteriorly. This was sewn in place with a 4-0 nylon suture. The  patient had a closure of her platysma layer using interrupted 3-0 Vicryl  sutures. Note that the omohyoid muscle was approximated with two 3-0 Vicryl  sutures. Then the subcu layers approximated with interrupted 3-0 Vicryl  sutures. Skin was closed with a running subcu stitch  of 4-0 Vicryl suture and Steri-Strips applied. A 4x4 fixed to the skin with Hypafix tape. The  patient had charging of the TLS drain. Right iliac crest bone graft harvest  site was carefully irrigated. Bone wax applied to bleeding cancellous bone  surfaces then Gelfoam placed. The fascial layer of the lower abdomen was  approximated to the upper thigh with interrupted #1 Vicryl sutures, deep  subcu layers approximated interrupted #1 and 0 Vicryl sutures, the more  superficial layers with interrupted 2-0 Vicryl sutures and the skin closed  with running subcu stitch of 4-0 Vicryl. Tincture of benzoin and Steri-  Strips applied. The patient then had placement of 4x4s fixed to the skin  with Hypafix tape. A Aspen collar was applied to the neck. The patient was  then reactivated, extubated and returned recovery room in satisfactory  condition. Note that radiographs intraoperatively did demonstrate excellent  position alignment of the plate and bone grafts without evidence  retropulsion of either. The patient should be noted to have had locking each  of the screws to the plate using locking deformation of the locking and  mechanism. This done without difficulty.      Kerrin Champagne, M.D.  Electronically Signed     JEN/MEDQ  D:  07/08/2005  T:  07/09/2005  Job:  [id number]

## 2011-01-03 NOTE — Discharge Summary (Signed)
[name], [name]                  ACCOUNT NO.:  [id number]   MEDICAL RECORD NO.:  [id number]          PATIENT TYPE:  INP   LOCATION:  5039                         FACILITY:  [facility]   PHYSICIAN:  Kerrin Champagne, M.D.   DATE OF BIRTH:  [birth year]   DATE OF ADMISSION:  07/08/2005  DATE OF DISCHARGE:  07/10/2005                                 DISCHARGE SUMMARY   ADMISSION DIAGNOSES:  1.  Central cervical stenosis C3-4 and C4-5 with minimal gliosis changes.      Anterior listhesis at the C3-4 level. Bilateral upper extremity      radiculopathy in the C5 distribution with biceps weakness and shoulder      abduction weakness.  2.  Hypertension.  3.  Hyperlipidemia.  4.  Glaucoma.  5.  Depression.  6.  Psoriasis.   DISCHARGE DIAGNOSES:  1.  Central cervical stenosis C3-4 and C4-5 with minimal gliosis changes.      Anterior listhesis at the C3-4 level. Bilateral upper extremity      radiculopathy in the C5 distribution with biceps weakness and shoulder      abduction weakness.  2.  Hypertension.  3.  Hyperlipidemia.  4.  Glaucoma.  5.  Depression.  6.  Psoriasis.  7.  Urinary retention, resolved at discharge.   PROCEDURE:  On July 08, 2005, the patient underwent anterior cervical  diskectomy and fusion with decompression of the cervical canal at C3-4 and  C4-5 with right iliac crest bone graft and allograft fibular bone graft  composite. This performed by Dr. Otelia Sergeant, assisted by Maud Deed, P.A.C.,  under general anesthesia.   CONSULTATIONS:  Dr. Jarold Motto of urology.   BRIEF HISTORY:  The patient is a 77 year old female experiencing increasing  numbness bilateral upper extremities as well as weakness in both arms and  feeling of the arms being heavy. Radiographs demonstrated anterior listhesis  of C3 on C4 with MRI studies demonstrating cervical stenosis at both C3-4  and C4-5 greater than the other segments with some findings consistent with  early gliosis. It was felt  she would require operative intervention and was  admitted for the procedure as stated above.   BRIEF HOSPITAL COURSE:  The patient tolerated the procedure under general  anesthesia without difficulties. Postoperatively, she was initially treated  with IV analgesics and weaned to p.o. analgesics without difficulty.  Physical therapy assisted the patient with ambulation and she was noted to  be independent with ambulation. Occupational therapy assisted with ADLs and  she tolerated this well. Drain was removed from her neck on the second  postoperative day. At that time, dressing change was done and there was no  drainage. Unfortunately, she did have some areas of superficial ecchymosis  secondary to the preoperative scrub and I[place]an drapes. She was also noted to  be very sensitive to the tape and therefore dressing was left off of the  anterior neck. The wound of the right hip from the iliac crest bone graft  was doing well without drainage or erythema. A light dressing was placed in  this area. The  patient's Foley catheter was discontinued and she was able to  void without difficulty. Postoperatively, she was placed in a Philadelphia  collar and changed to an Aspen collar once in PACU. She was instructed in  using the Philadelphia collar for showering purposes and the Aspen collar at  all other times.   On the second postoperative day, the patient developed a urinary retention.  Dr. Jarold Motto was called for consultation. Postvoiding residuals were  performed. Dr. Jarold Motto had recommended beginning Flomax if her postvoiding  residuals were greater than 100 cc. Fortunately, the patient was able to  begin voiding better and did have less than 100 cc of postvoiding residual.  With this in mind, she was able to be discharged to her home. Her  medications were adjusted prior to discharge and Darvocet was utilized for  pain control.   PERTINENT LABORATORY VALUES:  EKG on admission normal sinus  rhythm, left  axis deviation, poor R-wave progression, cannot rule out anterior infarct  age undetermined with no previous EKGs available; confirmed by Dr. Jacinto Halim. No  chest x-ray on discharge. CBC on admission was within normal limits.  Postoperatively hemoglobin and hematocrit 11.9 and 34.5 respectively.  Coagulation studies on admission with elevated PTT at 44. Chemistry studies  on admission with glucose 112. Repeat BMET postoperatively with sodium 134,  potassium 3.3, glucose 139, remaining values normal. Urinalysis negative for  urinary tract infection. Urine culture on July 10, 2006 showed 1000  colonies of growth with this being an insignificant amounts.   PLAN:  The patient was discharged to her home. She was given instructions  for ambulating with her walker. She will wear her Aspen collar at all times.  She will avoid overhead activities and no lifting. Dressing changes will be  done daily at home. She will be allowed to shower utilizing her [place]  collar.   MEDICATIONS AT DISCHARGE:  Darvocet-N 100 1 every for 6 hours as needed for  pain and she will resume her preoperative home medications. She is  instructed in the use of stool softeners over-the-counter.   The patient will call if she has any further difficulties with urinary  retention. Otherwise we will see her in the office two weeks from the date  of her surgery. She will continue on a regular diet.   CONDITION ON DISCHARGE:  Stable.      Wende Neighbors, P.A.      Kerrin Champagne, M.D.  Electronically Signed    SMV/MEDQ  D:  11/05/2005  T:  11/06/2005  Job:  [id number]

## 2011-01-03 NOTE — Discharge Summary (Signed)
[name], [name]                  ACCOUNT NO.:  [id number]   MEDICAL RECORD NO.:  [id number]          PATIENT TYPE:  INP   LOCATION:  5021                         FACILITY:  [facility]   PHYSICIAN:  Kerrin Champagne, M.D.   DATE OF BIRTH:  [birth year]   DATE OF ADMISSION:  12/21/2007  DATE OF DISCHARGE:  12/28/2007                               DISCHARGE SUMMARY   ADMISSION DIAGNOSES:  1. Lumbar spinal stenosis with neurogenic claudication secondary to      foraminal entrapment, right greater than left L5-S1 with      degenerative spondylolisthesis, right-sided lateral recess stenosis      L4-5 with degenerative spondylolisthesis.  2. Hypertension.  3. Glaucoma.  4. Elevated cholesterol.  5. Psoriasis.  6. Depression.  7. History of deep venous thrombosis, right lower extremity.  8. Status post anterior cervical diskectomy and fusion C3-C4 and C4-      C5.   DISCHARGE DIAGNOSES:  1. Lumbar spinal stenosis with neurogenic claudication secondary to      foraminal entrapment, right greater than left L5-S1 with      degenerative spondylolisthesis, right-sided lateral recess stenosis      L4-5 with degenerative spondylolisthesis.  2. Hypertension.  3. Glaucoma.  4. Elevated cholesterol.  5. Psoriasis.  6. Depression.  7. History of deep venous thrombosis, right lower extremity.  8. Status post anterior cervical diskectomy and fusion C3-C4 and C4-      C5.  9. Posthemorrhagic anemia requiring blood transfusion.  10.Left iliopsoas hematoma.  11.Bilateral pleural effusion.  12.Hypovolemic shock.  13.Hypokalemia.  14.Hypomagnesemia.  15.Hypophosphatemia, all replaced during hospital stay.   PROCEDURE:  On Dec 21, 2007, the patient underwent central decompressive  laminectomy at L4-L5 and L5-S1, transforaminal lumbar interbody fusion  right L4-5 with 9-mm Concorde cage, and local bone graft transforaminal  lumbar interbody fusion, right side L5-S1 with 8-mm cage and local bone  graft.   Posterolateral fusion at L4-S1 using combination of local bone  graft and Vitoss material.  Left L5 bone marrow aspirate via pedicle.  This was performed by Dr. Otelia Sergeant, assisted by Maud Deed, PA-C under  general anesthesia.   CONSULTATIONS:  [facility] hospitalist and by Dr. Delford Field and associated  intensivist.   BRIEF HISTORY:  The patient is a 77 year old female with increasing  neurogenic claudication with standing and ambulation and inability to be  on her feet greater than 5-10 minutes at a time.  She has attempted  physical therapy as well as medication and epidural steroid injections.  She has failed conservative treatment.  She would like to proceed with  surgical intervention.  Risks and benefits of the procedure were  discussed, and the patient was admitted to proceed with the procedure as  stated above.   BRIEF HOSPITAL COURSE:  The patient tolerated the procedure under  general anesthesia.  Intraoperatively, she received 2 units of Cell  Saver blood.  Postoperatively in the post anesthesia care unit, she was  noted to be hypotensive.  Multiple attempts with medications and IV  fluids did not relieve her of the hypotension.  She was taken to the  intensive care unit and continued to be monitored.  The intensivist was  called for consultation as was the patient's primary care physician who  utilized [facility] hospitalist.  Attempted A-line was failed multiple times  and the patient requested no further attempts.  During the stay in the  intensive care unit, she did not receive physical therapy.  She mostly  remained at bedrest.  She remained on clear liquids until her bowel  function returned.  The patient underwent echocardiogram showing  ejection fraction of 75% with no diagnostic evidence of left ventricular  regional wall motion abnormality.  The patient did have an episode of  oversedation with narcotics.  She required Narcan.  She was able to  return to baseline mental  status.  The patient had bilateral pleural  effusion, left greater than right.  She was treated for atelectasis.  She had further drops in her hemoglobin and hematocrit and required  further blood transfusion receiving a total of 4 units of packed red  blood cells during the hospital stay.  Cortisol level was drawn to rule  out adrenal insufficiency.  However, values were never made available as  the lab did not obtain the appropriate specimen.  The patient continued  to have hypotension, which was unexplained.  CT scan of the abdomen did  show a left iliopsoas hematoma.  This was felt to be a possible source  for her hypovolemic shock and hypotension.  The patient eventually was  hemodynamically stable for 48 hours.  Despite her bilateral pleural  effusion, she was saturating well on room air.  She was allowed to  autodiurese as her blood pressure tolerated this well.  For her anemia,  she continued to use supplementation after receiving packed red blood  cells as stated above.  Potassium, magnesium, and phosphorus were  repleted.  The patient was started on Lovenox for DVT prophylaxis as she  had been sedentary for several days while stabilizing hemodynamically.  She also had history of DVT of the lower extremity.  Eventually, she was  stable enough for return to the orthopedic floor, there physical therapy  was initiated.  The patient was treated with an Aspen LSO which she was  allowed to don and doff at bedside and was able to demonstrate  independence with this prior to discharge.  The patient was instructed  in ambulation and gait training and utilize her brace anytime out of bed  as well as a walker.  She made advances with physical therapy ambulating  as much as 150 feet in the hallway.  Occupational therapy assisted with  ADLs.  The patient was independent with these at discharge.  The patient  was comfortable on p.o. analgesics.  Dressing changes were done daily  after the  Hemovac had been discontinued on the first postoperative day.  Her wound showed no drainage throughout the hospital stay and was  healing well.  On Dec 28, 2007, the patient was taking a regular diet.  She was voiding without difficulty and having flatus as well as bowel  movement.  Neurovascular and motor function of the lower extremities was  noted to be intact and she was able to be discharged to her home in  stable condition.   PERTINENT LABORATORY VALUES:  Hemoglobin and hematocrit on December 15, 2007, preoperative values 11.0 and 32.9.  The patient's values varied  throughout the hospital stay requiring multiple transfusions.  She  received a total of  4 units of packed red blood cells during the  hospital stay and at discharge hemoglobin 11.0, hematocrit 32.9.  Chemistry studies on admission were within normal limits and initially  postop were within normal limits.  Potassium at lowest value of 2.9,  value at 3.2 at discharge.  BUN noted to be 3 with creatinine 0.54 at  discharge.  Calcium is 8.3 at discharge.  Total protein at lowest value  4.9, albumin 3.0, magnesium 1.4, phosphorus less than 1.0.  However,  after repletion, magnesium 1.7, phosphorus 8.8.  At discharge, total  protein 4.9, albumin 2.6.  The CK and CK-MB values initially at 907 and  25.6 respectively.  Prior to discharge CK 491, CK-MB 13.9.  Troponin  levels within normal limits with no indication for myocardial injury.  Cortisol level was obtained showing values 22.4.  Urinalysis on  admission negative for urinary tract infection and repeat on May 11,  negative for urinary tract infection.  Urine culture with 10,000  colonies of E-coli, final report on Dec 30, 2007.   PLAN:  Arrangements made for home health physical therapy and  occupational therapy.  The patient to wear her brace at all times when  out of bed and will don and doff the brace at bedside.  No bending,  lifting or twisting.  She will keep her wound  dry and all clean and  replace the dressing daily.  She may shower with the Tegaderm dressing  in place.   FOLLOWUP:  The patient will follow up with Dr. Otelia Sergeant 2 weeks from  surgery.  Arrangements made for office visit.   MEDICATIONS:  1. Cosopt eye drops twice daily.  2. Paroxetine 20 mg daily.  3. Vytorin 10/40 daily.  4. Pantoprazole 40 mg daily.  5. Quinapril/hydrochlorothiazide 20/12.5 1/2 tablet daily.  6. Neurontin 1 to 1-1/2 tablet three times daily.  7. Xalatan eye drops daily.  8. K-Dur 20 mEq daily.  9. Trinsicon twice daily.  10.Celebrex 200 mg daily.  11.Darvocet N 100 1 every 4-6 hours as needed for pain.  12.Robaxin 500 mg one every 8 hours as needed for spasm.   She was advised to call the office should she have fevers or chills and  also if there are any other questions prior to her return office visit.  No dietary restrictions.   CONDITION ON DISCHARGE:  Stable.      Wende Neighbors, P.A.      Kerrin Champagne, M.D.  Electronically Signed    SMV/MEDQ  D:  02/03/2008  T:  02/03/2008  Job:  [id number]

## 2011-01-03 NOTE — Consult Note (Signed)
[name], [name]                  ACCOUNT NO.:  [id number]   MEDICAL RECORD NO.:  [id number]          PATIENT TYPE:  INP   LOCATION:  5039                         FACILITY:  [facility]   PHYSICIAN:  Maretta Bees. Vonita Moss, M.D.DATE OF BIRTH:  07-11-1927   DATE OF CONSULTATION:  07/10/2005  DATE OF DISCHARGE:                                   CONSULTATION   I was asked to see this pleasant 77 year old lady who underwent extensive C-  spine surgery.  She said she has had no voiding troubles preoperatively but  postoperatively after a Foley was removed she has had some difficulty  voiding small amounts of urine at a time.  There is no dysuria or hematuria.  Cathed urine has been sent for UA and culture.   She has a history of hypertension, GERD and glaucoma.   Medications include Tylenol on admission, in addition to __________,  labetalol, Protonix, fluoxetine, timolol and Xalatan.   She is allergic to penicillin.   She does not smoke since she quit 5 years ago.  She does drink some alcohol.   PHYSICAL EXAMINATION:  GENERAL:  She is alert and oriented.  Does not seem  to be in acute distress.  SKIN:  Skin is warm and dry.  NECK:  Neck is in a brace.  ABDOMEN:  No CV or bladder tenderness.  No hepatosplenomegaly.  No hernias.   IMPRESSION:  Postop urinary voiding difficulties with partial retention and  small voidings.   PLAN:  She is ambulatory and we will watch her voiding doing in and out of  cath and we will check her PVR Q shift and PRN.  If her PVR is less than 100  cc, she could be discharged per orthopaedics.  If it is over 100 cc, we will  get her started on Flomax and continue catheterizations as need be.  We will  follow up on her tomorrow unless she goes home today and otherwise we can  see her in the office in followup in a couple of weeks.      Maretta Bees. Vonita Moss, M.D.  Electronically Signed     LJP/MEDQ  D:  07/10/2005  T:  07/10/2005  Job:  [id number]   cc:   Kerrin Champagne, M.D.  Fax: [phone]

## 2011-01-03 NOTE — Op Note (Signed)
NAMEMADGIE, DHALIWAL                  ACCOUNT NO.:  [id number]   MEDICAL RECORD NO.:  [id number]          PATIENT TYPE:  AMB   LOCATION:  DAY                          [facility]ACILITY:  [facility]   PHYSICIAN:  Kerrin Champagne, M.D.   DATE OF BIRTH:  March 19, 1927   DATE OF PROCEDURE:  02/10/2006  DATE OF DISCHARGE:                                 OPERATIVE REPORT   PREOPERATIVE DIAGNOSIS:  Right subcutaneous mass overlying the lateral  aspect of the right shoulder.   POSTOPERATIVE DIAGNOSIS:  Right subcutaneous mass overlying the lateral  aspect of the right shoulder.   FINDINGS:  Consistency clinically of lipoma.   PROCEDURE:  Removal of subcutaneous mass right lateral shoulder,  characteristics of a lipoma.   SURGEON:  Kerrin Champagne, M.D.   ANESTHESIA:  General via oral obturator Dr. Okey Dupre.   ESTIMATED BLOOD LOSS:  10 mL.   DRAINS:  TLS drain 10 French x 1 to red top tube.   HISTORY OF PRESENT ILLNESS:  The patient is a 77 year old right-hand  dominant female whose undergone previous anterior diskectomy and fusion by  myself for severe cervical stenosis.  She complains of a slow-growing mass  over the right lateral shoulders, been told benign the in the past and her  clinical findings are consistent with a subcutaneous lipoma that is large  measuring about 8 cm x 6-7 cm from anterior to posterior as well as superior  to inferior.  She is brought to the operating room to undergo an outpatient  removal of subcutaneous mass presumably lipoma.   INTRAOPERATIVE FINDINGS:  The mass approximately is 8-9 cm by some 7-8 cm by  approximately 3-4 cm. Findings consistency of the a subcutaneous lipoma of  the vascular stalk.  The mass was sent to pathology for histologic and gross  pathology.   DESCRIPTION OF PROCEDURE:  After adequate general anesthesia, the patient in  supine position, the right upper extremity prepped from the wrist to the  axilla, DuraPrep solution, draped in the usual manner.  A  right upper  extremity drape was used.  The patient had an incision longitudinally over  the lateral aspects of right shoulder with ellipsing of a central portion of  skin from the area overlying the mass over the right proximal lateral arm.  The incision carried sharply through skin, subcu areas then developed using  Metzenbaum scissors circumferentially defining the lipoma from the subcu  fatty layers quite easily circumferentially.  Identifying vascular stalk,  clamping and then dividing and then removing the lipoma.  The stock was then  tied off using a suture ligature of 2-0 Vicryl.  Small bleeders controlled  using electrocautery.  There was no active bleeding present from the subcu  area present. A 10-French drain was placed in the depth  of the incision  exiting distally. The subcu layers were then irrigated with copious amounts  of irrigant solution and then closing the dead space using #0 Vicryl sutures  in the subcu area.  The more superficial layers with interrupted 2-0 Vicryl  sutures were approximated. The skin closed  with a running subcu stitch of 4-  0 Vicryl.  Dermabond was then applied to the incision sealing it. 4x4s fixed  to the skin with Hypafix tape after infiltration of the area using Marcaine  1/2% plain 20 mL.  The drain was then fastened to the dressing using pink  tape.  The patient was then reactivated, extubated, returned to the recovery  room in satisfactory condition.  All instrument and sponge counts were  correct.   POSTOPERATIVE CARE:  The patient was alert and oriented. She will be  discharged home with a sling for the right arm. The family will be taught to  change the red top tube as necessary p.r.n. red top tube filled. Will see  him back in the office in 2 days for removal of the drain from the right  arm. For discomfort Vicodin one to two q.4-6 h p.r.n. pain. Placed her on  Keflex 500 mg __________ p.o. q.8 h until the drain is out.      Kerrin Champagne, M.D.  Electronically Signed     JEN/MEDQ  D:  02/10/2006  T:  02/10/2006  Job:  [id number]

## 2011-05-13 LAB — COMPREHENSIVE METABOLIC PANEL
ALT: 21
AST: 17
Albumin: 3.9
Alkaline Phosphatase: 76
BUN: 10
CO2: 28
Calcium: 9.2
Chloride: 108
Creatinine, Ser: 0.75
GFR calc Af Amer: >60
GFR calc non Af Amer: >60
Glucose, Bld: 98
Potassium: 3.8
Sodium: 142
Total Bilirubin: 0.5
Total Protein: 6

## 2011-05-13 LAB — TYPE AND SCREEN
ABO/RH(D): B POS
Antibody Screen: NEGATIVE

## 2011-05-13 LAB — URINALYSIS, ROUTINE W REFLEX MICROSCOPIC
Bilirubin Urine: NEGATIVE
Glucose, UA: NEGATIVE
Hgb urine dipstick: NEGATIVE
Ketones, ur: NEGATIVE
Nitrite: NEGATIVE
Protein, ur: NEGATIVE
Specific Gravity, Urine: 1.022
Urobilinogen, UA: 0.2
pH: 6

## 2011-05-13 LAB — PROTIME-INR
INR: 0.9
Prothrombin Time: 12.5

## 2011-05-13 LAB — URINE MICROSCOPIC-ADD ON

## 2011-05-13 LAB — CBC
HCT: 32.9 — ABNORMAL LOW
Hemoglobin: 11 — ABNORMAL LOW
MCHC: 33.5
MCV: 90.2
Platelets: 294
RBC: 3.65 — ABNORMAL LOW
RDW: 14.5
WBC: 5.8

## 2011-05-13 LAB — DIFFERENTIAL
Basophils Absolute: 0
Basophils Relative: 1
Eosinophils Absolute: 0.2
Eosinophils Relative: 4
Lymphocytes Relative: 31
Lymphs Abs: 1.8
Monocytes Absolute: 0.4
Monocytes Relative: 7
Neutro Abs: 3.3
Neutrophils Relative %: 57

## 2011-05-13 LAB — APTT: aPTT: 42 — ABNORMAL HIGH

## 2011-05-13 LAB — ABO/RH: ABO/RH(D): B POS

## 2011-05-14 LAB — BASIC METABOLIC PANEL
BUN: 10
BUN: 12
BUN: 3 — ABNORMAL LOW
BUN: 4 — ABNORMAL LOW
BUN: 5 — ABNORMAL LOW
BUN: 5 — ABNORMAL LOW
BUN: 6
CO2: 22
CO2: 24
CO2: 25
CO2: 25
CO2: 26
CO2: 28
CO2: 28
Calcium: 7.7 — ABNORMAL LOW
Calcium: 7.8 — ABNORMAL LOW
Calcium: 8.1 — ABNORMAL LOW
Calcium: 8.2 — ABNORMAL LOW
Calcium: 8.3 — ABNORMAL LOW
Calcium: 8.4
Calcium: 8.5
Chloride: 103
Chloride: 106
Chloride: 107
Chloride: 108
Chloride: 109
Chloride: 112
Chloride: 112
Creatinine, Ser: 0.52
Creatinine, Ser: 0.52
Creatinine, Ser: 0.53
Creatinine, Ser: 0.54
Creatinine, Ser: 0.56
Creatinine, Ser: 0.59
Creatinine, Ser: 0.6
GFR calc Af Amer: >60
GFR calc Af Amer: >60
GFR calc Af Amer: >60
GFR calc Af Amer: >60
GFR calc Af Amer: >60
GFR calc Af Amer: >60
GFR calc Af Amer: >60
GFR calc non Af Amer: >60
GFR calc non Af Amer: >60
GFR calc non Af Amer: >60
GFR calc non Af Amer: >60
GFR calc non Af Amer: >60
GFR calc non Af Amer: >60
GFR calc non Af Amer: >60
Glucose, Bld: 101 — ABNORMAL HIGH
Glucose, Bld: 104 — ABNORMAL HIGH
Glucose, Bld: 111 — ABNORMAL HIGH
Glucose, Bld: 133 — ABNORMAL HIGH
Glucose, Bld: 136 — ABNORMAL HIGH
Glucose, Bld: 171 — ABNORMAL HIGH
Glucose, Bld: 176 — ABNORMAL HIGH
Potassium: 2.9 — ABNORMAL LOW
Potassium: 3.2 — ABNORMAL LOW
Potassium: 3.2 — ABNORMAL LOW
Potassium: 3.3 — ABNORMAL LOW
Potassium: 3.5
Potassium: 3.7
Potassium: 5.5 — ABNORMAL HIGH
Sodium: 139
Sodium: 139
Sodium: 140
Sodium: 140
Sodium: 141
Sodium: 141
Sodium: 142

## 2011-05-14 LAB — COMPREHENSIVE METABOLIC PANEL
ALT: 21
ALT: 21
ALT: 27
AST: 21
AST: 29
AST: 33
Albumin: 2.6 — ABNORMAL LOW
Albumin: 3 — ABNORMAL LOW
Albumin: 3 — ABNORMAL LOW
Alkaline Phosphatase: 52
Alkaline Phosphatase: 55
Alkaline Phosphatase: 64
BUN: 14
BUN: 16
BUN: 4 — ABNORMAL LOW
CO2: 24
CO2: 25
CO2: 28
Calcium: 8.3 — ABNORMAL LOW
Calcium: 8.3 — ABNORMAL LOW
Calcium: 8.4
Chloride: 107
Chloride: 107
Chloride: 108
Creatinine, Ser: 0.5
Creatinine, Ser: 0.83
Creatinine, Ser: 0.83
GFR calc Af Amer: >60
GFR calc Af Amer: >60
GFR calc Af Amer: >60
GFR calc non Af Amer: >60
GFR calc non Af Amer: >60
GFR calc non Af Amer: >60
Glucose, Bld: 103 — ABNORMAL HIGH
Glucose, Bld: 178 — ABNORMAL HIGH
Glucose, Bld: 191 — ABNORMAL HIGH
Potassium: 3.2 — ABNORMAL LOW
Potassium: 3.9
Potassium: 4
Sodium: 139
Sodium: 140
Sodium: 143
Total Bilirubin: 0.6
Total Bilirubin: 1
Total Bilirubin: 1
Total Protein: 4.9 — ABNORMAL LOW
Total Protein: 4.9 — ABNORMAL LOW
Total Protein: 5 — ABNORMAL LOW

## 2011-05-14 LAB — BLOOD GAS, ARTERIAL
Acid-base deficit: 2.5 — ABNORMAL HIGH
Acid-base deficit: 5.7 — ABNORMAL HIGH
Acid-base deficit: 7.5 — ABNORMAL HIGH
Acid-base deficit: 8.5 — ABNORMAL HIGH
Bicarbonate: 20.3
Bicarbonate: 20.6
Bicarbonate: 21.1
Bicarbonate: 23.2
Drawn by: 24513
Drawn by: 24513
Drawn by: 265131
FIO2: 1
FIO2: 1
O2 Content: 2
O2 Content: 3
O2 Saturation: 98.5
O2 Saturation: 98.8
O2 Saturation: 99.2
O2 Saturation: 99.6
Patient temperature: 98.6
Patient temperature: 98.6
Patient temperature: 98.6
Patient temperature: 99
TCO2: 22.2
TCO2: 22.2
TCO2: 23.8
TCO2: 24.7
pCO2 arterial: 50.2 — ABNORMAL HIGH
pCO2 arterial: 51.1 — ABNORMAL HIGH
pCO2 arterial: 64.1
pCO2 arterial: 85.6
pH, Arterial: 7.022 — CL
pH, Arterial: 7.128 — CL
pH, Arterial: 7.23 — ABNORMAL LOW
pH, Arterial: 7.286 — ABNORMAL LOW
pO2, Arterial: 114 — ABNORMAL HIGH
pO2, Arterial: 126 — ABNORMAL HIGH
pO2, Arterial: 192 — ABNORMAL HIGH
pO2, Arterial: 213 — ABNORMAL HIGH

## 2011-05-14 LAB — CBC
HCT: 21.5 — ABNORMAL LOW
HCT: 26 — ABNORMAL LOW
HCT: 28.7 — ABNORMAL LOW
HCT: 32.2 — ABNORMAL LOW
HCT: 32.9 — ABNORMAL LOW
HCT: 33.2 — ABNORMAL LOW
HCT: 34.1 — ABNORMAL LOW
Hemoglobin: 10.7 — ABNORMAL LOW
Hemoglobin: 11 — ABNORMAL LOW
Hemoglobin: 11.1 — ABNORMAL LOW
Hemoglobin: 11.6 — ABNORMAL LOW
Hemoglobin: 7.1 — CL
Hemoglobin: 8.6 — ABNORMAL LOW
Hemoglobin: 9.7 — ABNORMAL LOW
MCHC: 32.8
MCHC: 32.9
MCHC: 33.3
MCHC: 33.4
MCHC: 33.5
MCHC: 33.6
MCHC: 33.8
MCV: 87.1
MCV: 87.3
MCV: 87.9
MCV: 89.3
MCV: 89.7
MCV: 89.9
MCV: 90.2
Platelets: 113 — ABNORMAL LOW
Platelets: 116 — ABNORMAL LOW
Platelets: 125 — ABNORMAL LOW
Platelets: 176
Platelets: 178
Platelets: 196
Platelets: 227
RBC: 2.41 — ABNORMAL LOW
RBC: 2.88 — ABNORMAL LOW
RBC: 3.3 — ABNORMAL LOW
RBC: 3.59 — ABNORMAL LOW
RBC: 3.74 — ABNORMAL LOW
RBC: 3.8 — ABNORMAL LOW
RBC: 3.8 — ABNORMAL LOW
RDW: 14.7
RDW: 14.7
RDW: 14.8
RDW: 14.8
RDW: 15
RDW: 15.1
RDW: 15.4
WBC: 15.1 — ABNORMAL HIGH
WBC: 18.3 — ABNORMAL HIGH
WBC: 19.1 — ABNORMAL HIGH
WBC: 8.5
WBC: 8.9
WBC: 9.3
WBC: 9.7

## 2011-05-14 LAB — TROPONIN I: Troponin I: 0.05

## 2011-05-14 LAB — CARDIAC PANEL(CRET KIN+CKTOT+MB+TROPI)
CK, MB: 10.3 — ABNORMAL HIGH
CK, MB: 13.9 — ABNORMAL HIGH
CK, MB: 25.6 — ABNORMAL HIGH
Relative Index: 2.4
Relative Index: 2.8 — ABNORMAL HIGH
Relative Index: 2.8 — ABNORMAL HIGH
Total CK: 425 — ABNORMAL HIGH
Total CK: 491 — ABNORMAL HIGH
Total CK: 907 — ABNORMAL HIGH
Troponin I: 0.01
Troponin I: 0.03
Troponin I: 0.03

## 2011-05-14 LAB — URINALYSIS, ROUTINE W REFLEX MICROSCOPIC
Bilirubin Urine: NEGATIVE
Glucose, UA: NEGATIVE
Hgb urine dipstick: NEGATIVE
Ketones, ur: NEGATIVE
Nitrite: NEGATIVE
Protein, ur: NEGATIVE
Specific Gravity, Urine: 1.004 — ABNORMAL LOW
Urobilinogen, UA: 1
pH: 8

## 2011-05-14 LAB — URINE CULTURE: Colony Count: 10000

## 2011-05-14 LAB — CROSSMATCH
ABO/RH(D): B POS
Antibody Screen: NEGATIVE

## 2011-05-14 LAB — HEMOGLOBIN AND HEMATOCRIT, BLOOD
HCT: 28.7 — ABNORMAL LOW
HCT: 29 — ABNORMAL LOW
HCT: 29.8 — ABNORMAL LOW
HCT: 31.4 — ABNORMAL LOW
Hemoglobin: 10.5 — ABNORMAL LOW
Hemoglobin: 9.4 — ABNORMAL LOW
Hemoglobin: 9.6 — ABNORMAL LOW
Hemoglobin: 9.8 — ABNORMAL LOW

## 2011-05-14 LAB — CK TOTAL AND CKMB (NOT AT ARMC)
CK, MB: 13.2 — ABNORMAL HIGH
Relative Index: 2.4
Total CK: 560 — ABNORMAL HIGH

## 2011-05-14 LAB — MAGNESIUM
Magnesium: 1.4 — ABNORMAL LOW
Magnesium: 1.7
Magnesium: 1.8
Magnesium: 1.9

## 2011-05-14 LAB — PHOSPHORUS
Phosphorus: 1.3 — ABNORMAL LOW
Phosphorus: 2.9
Phosphorus: 4.6
Phosphorus: 8.8 — ABNORMAL HIGH
Phosphorus: <1 — CL

## 2011-05-14 LAB — POTASSIUM: Potassium: 3.1 — ABNORMAL LOW

## 2011-05-14 LAB — PROTIME-INR
INR: 1.1
Prothrombin Time: 14.2

## 2011-05-14 LAB — CORTISOL: Cortisol, Plasma: 22.4

## 2011-09-01 ENCOUNTER — Other Ambulatory Visit: Payer: Self-pay | Admitting: Dermatology

## 2011-09-25 ENCOUNTER — Other Ambulatory Visit: Payer: Self-pay | Admitting: Internal Medicine

## 2011-09-25 DIAGNOSIS — Z1231 Encounter for screening mammogram for malignant neoplasm of breast: Secondary | ICD-10-CM

## 2011-12-08 ENCOUNTER — Ambulatory Visit
Admission: RE | Admit: 2011-12-08 | Discharge: 2011-12-08 | Disposition: A | Discharge status: Home / Self Care | Payer: Medicare Other | Source: Ambulatory Visit | Attending: Internal Medicine | Admitting: Internal Medicine

## 2011-12-08 DIAGNOSIS — Z1231 Encounter for screening mammogram for malignant neoplasm of breast: Secondary | ICD-10-CM

## 2012-08-09 ENCOUNTER — Other Ambulatory Visit: Payer: Self-pay | Admitting: Internal Medicine

## 2012-08-09 DIAGNOSIS — M810 Age-related osteoporosis without current pathological fracture: Secondary | ICD-10-CM

## 2012-08-19 ENCOUNTER — Ambulatory Visit
Admission: RE | Admit: 2012-08-19 | Discharge: 2012-08-19 | Disposition: A | Discharge status: Home / Self Care | Payer: Medicare Other | Source: Ambulatory Visit | Attending: Internal Medicine | Admitting: Internal Medicine

## 2012-08-19 DIAGNOSIS — M810 Age-related osteoporosis without current pathological fracture: Secondary | ICD-10-CM

## 2012-11-16 ENCOUNTER — Other Ambulatory Visit: Payer: Self-pay | Admitting: Geriatric Medicine

## 2012-11-16 ENCOUNTER — Other Ambulatory Visit: Payer: Medicare Other

## 2012-11-16 DIAGNOSIS — I10 Essential (primary) hypertension: Secondary | ICD-10-CM

## 2012-11-16 DIAGNOSIS — R7989 Other specified abnormal findings of blood chemistry: Secondary | ICD-10-CM

## 2012-11-16 DIAGNOSIS — E785 Hyperlipidemia, unspecified: Secondary | ICD-10-CM

## 2012-11-17 LAB — COMPREHENSIVE METABOLIC PANEL
ALT: 21 IU/L (ref 0–32)
AST: 15 IU/L (ref 0–40)
Albumin/Globulin Ratio: 2.1 (ref 1.1–2.5)
Albumin: 4 g/dL (ref 3.5–4.7)
Alkaline Phosphatase: 50 IU/L (ref 39–117)
BUN/Creatinine Ratio: 18 (ref 11–26)
BUN: 12 mg/dL (ref 8–27)
CO2: 30 mmol/L — ABNORMAL HIGH (ref 19–28)
Calcium: 10 mg/dL (ref 8.6–10.2)
Chloride: 104 mmol/L (ref 97–108)
Creatinine, Ser: 0.66 mg/dL (ref 0.57–1.00)
GFR calc Af Amer: 94 mL/min/{1.73_m2} (ref >59)
GFR calc non Af Amer: 82 mL/min/{1.73_m2} (ref >59)
Globulin, Total: 1.9 g/dL (ref 1.5–4.5)
Glucose: 84 mg/dL (ref 65–99)
Potassium: 4.8 mmol/L (ref 3.5–5.2)
Sodium: 144 mmol/L (ref 134–144)
Total Bilirubin: 0.3 mg/dL (ref 0.0–1.2)
Total Protein: 5.9 g/dL — ABNORMAL LOW (ref 6.0–8.5)

## 2012-11-17 LAB — LIPID PANEL
Chol/HDL Ratio: 2.7 ratio units (ref 0.0–4.4)
Cholesterol, Total: 204 mg/dL — ABNORMAL HIGH (ref 100–199)
HDL: 75 mg/dL (ref >39)
LDL Calculated: 79 mg/dL (ref 0–99)
Triglycerides: 251 mg/dL — ABNORMAL HIGH (ref 0–149)
VLDL Cholesterol Cal: 50 mg/dL — ABNORMAL HIGH (ref 5–40)

## 2012-11-17 LAB — HEMOGLOBIN A1C
Est. average glucose Bld gHb Est-mCnc: 134 mg/dL
Hgb A1c MFr Bld: 6.3 % — ABNORMAL HIGH (ref 4.8–5.6)

## 2012-11-18 ENCOUNTER — Encounter: Payer: Self-pay | Admitting: Internal Medicine

## 2012-11-18 ENCOUNTER — Ambulatory Visit (INDEPENDENT_AMBULATORY_CARE_PROVIDER_SITE_OTHER): Payer: Medicare Other | Admitting: Internal Medicine

## 2012-11-18 VITALS — BP 130/80 | HR 90 | Temp 98.2°F | Resp 16 | Wt 134.0 lb

## 2012-11-18 DIAGNOSIS — I10 Essential (primary) hypertension: Secondary | ICD-10-CM | POA: Insufficient documentation

## 2012-11-18 DIAGNOSIS — R739 Hyperglycemia, unspecified: Secondary | ICD-10-CM | POA: Insufficient documentation

## 2012-11-18 DIAGNOSIS — G629 Polyneuropathy, unspecified: Secondary | ICD-10-CM | POA: Insufficient documentation

## 2012-11-18 DIAGNOSIS — E785 Hyperlipidemia, unspecified: Secondary | ICD-10-CM

## 2012-11-18 DIAGNOSIS — M0550 Rheumatoid polyneuropathy with rheumatoid arthritis of unspecified site: Secondary | ICD-10-CM

## 2012-11-18 DIAGNOSIS — E781 Pure hyperglyceridemia: Secondary | ICD-10-CM

## 2012-11-18 DIAGNOSIS — R7309 Other abnormal glucose: Secondary | ICD-10-CM

## 2012-11-18 DIAGNOSIS — G63 Polyneuropathy in diseases classified elsewhere: Secondary | ICD-10-CM

## 2012-11-18 NOTE — Assessment & Plan Note (Signed)
C/o pain in hands and feet.  Not new.  Likely multifactorial as also prediabetic for a long time due to longstanding prednisone.    Will consider gabapentin in future.

## 2012-11-18 NOTE — Progress Notes (Signed)
Patient ID: [name], female   DOB: 01/24/1927, 79 y.o.   MRN: [id number] Code Status:  DNR   Allergies  Allergen Reactions  . Penicillins     Chief Complaint  Patient presents with  . Medical Managment of Chronic Issues    no new problems    HPI: Patient is a 79 y.o. Caucasian female seen in the office today for follow up of her chronic conditions.  Feels fine except her numbness and tingling of her hands and feet. Discussed high triglycerides and prediabetic hba1c which is stable.    Review of Systems:  Review of Systems  Constitutional: Negative for fever, chills and weight loss.  HENT: Negative for congestion.   Eyes: Negative for blurred vision.  Respiratory: Negative for cough and shortness of breath.   Cardiovascular: Negative for chest pain.  Gastrointestinal: Negative for heartburn and constipation.  Genitourinary: Negative for dysuria, urgency and frequency.  Musculoskeletal: Positive for back pain and joint pain. Negative for myalgias and falls.  Skin: Negative for rash.  Neurological: Positive for tingling. Negative for dizziness, focal weakness and headaches.  Endo/Heme/Allergies: Does not bruise/bleed easily.  Psychiatric/Behavioral: Negative for depression and memory loss. The patient is not nervous/anxious.      Past Medical History  Diagnosis Date  . Arthritis     Osteoarthritis  . GERD (gastroesophageal reflux disease)   . Hyperlipidemia   . Depression   . Osteopenia    Past Surgical History  Procedure Laterality Date  . Appendectomy  1936  . Tonsillectomy  1941  . Finger surgery  1989  . Cervical fusion  2004  . Spine surgery  2009    Back Fusion  . Colon surgery  06/12/2010    Colonoscopy   Social History:   reports that she has quit smoking. She does not have any smokeless tobacco history on file. She reports that she does not drink alcohol. Her drug history is not on file.  History reviewed. No pertinent family  history.  Medications: Patient's Medications  New Prescriptions   No medications on file  Previous Medications   ALENDRONATE (FOSAMAX) 70 MG TABLET    Take 70 mg by mouth every 7 (seven) days. Take one tablet by mouth weekly.  Stay upright for 1 hour after taking.   ASPIRIN 81 MG TABLET    Take 81 mg by mouth daily.   CHOLECALCIFEROL (VITAMIN D) 1000 UNITS TABLET    Take 1,000 Units by mouth 2 (two) times daily. Take one tablet twice daily for vitamin D supplement.   GABAPENTIN (NEURONTIN) 100 MG CAPSULE    Take 100 mg by mouth 3 (three) times daily. Take 1 capsule every 8 hours as needed for nerve pain.   LATANOPROST (XALATAN) 0.005 % OPHTHALMIC SOLUTION       LUBIPROSTONE (AMITIZA) 8 MCG CAPSULE    Take 8 mcg by mouth 2 (two) times daily with a meal. Take one capsule twice a day for constipation.   MILNACIPRAN HCL (SAVELLA) 25 MG TABS    Take 25 mg by mouth 2 (two) times daily. Take one tablet twice daily for fibromyalgia.   OXYCODONE-ACETAMINOPHEN (PERCOCET/ROXICET) 5-325 MG PER TABLET    Take 1 tablet by mouth 3 (three) times daily as needed for pain. Take one tablet three times daily as needed for pain.   PANTOPRAZOLE (PROTONIX) 40 MG TABLET    Take 40 mg by mouth daily. Take one tablet once daily for acid reflux.   PREDNISONE (DELTASONE) 5 MG  TABLET       QUINAPRIL-HYDROCHLOROTHIAZIDE (ACCURETIC) 20-12.5 MG PER TABLET    Take 1 tablet by mouth daily. Take one tablet once a day.   SIMVASTATIN (ZOCOR) 20 MG TABLET    Take 20 mg by mouth daily. Take one tablet once daily for cholesterol.   ZOSTER VACCINE LIVE, PF, (ZOSTAVAX) 16109 UNT/0.65ML INJECTION    Inject 0.65 mLs into the skin once. Inject 0.65 mL subcutaneously.  Modified Medications   No medications on file  Discontinued Medications   CALCIUM 1200-1000 MG-UNIT CHEW    Chew 1 tablet by mouth daily. Take one tablet once a day.     Physical Exam:  Filed Vitals:   11/18/12 1026  BP: 130/80  Pulse: 90  Temp: 98.2 F (36.8 C)   TempSrc: Oral  Resp: 16  Weight: 134 lb (60.782 kg)  SpO2: 97%  Physical Exam  Constitutional: She is oriented to person, place, and time. She appears well-developed and well-nourished. No distress.  HENT:  Head: Normocephalic and atraumatic.  Eyes: Pupils are equal, round, and reactive to light.  Cardiovascular: Normal rate, regular rhythm, normal heart sounds and intact distal pulses.  Exam reveals no gallop and no friction rub.   No murmur heard. Pulmonary/Chest: Effort normal and breath sounds normal. No respiratory distress.  Abdominal: Soft. Bowel sounds are normal.  Musculoskeletal: Normal range of motion.  Neurological: She is alert and oriented to person, place, and time. No cranial nerve deficit. Coordination normal.  Skin: Skin is warm and dry. No erythema.  Psychiatric: She has a normal mood and affect. Her behavior is normal. Judgment and thought content normal.   Labs reviewed: Basic Metabolic Panel:  Recent Labs  60/45/40 1029  NA 144  K 4.8  CL 104  CO2 30*  GLUCOSE 84  BUN 12  CREATININE 0.66  CALCIUM 10.0   Liver Function Tests:  Recent Labs  11/16/12 1029  AST 15  ALT 21  ALKPHOS 50  BILITOT 0.3  PROT 5.9*   Lipid Panel:  Recent Labs  11/16/12 1029  HDL 75  LDLCALC 79  TRIG 251*  CHOLHDL 2.7   11/19/11:  hba1c 6.3  Past Procedures: 11/17/22006 Chest X-Ray 3-74mm nodule in the right lower lobe. Elevation of the  right hemidiaphragm with associated left lower lobe atelectasis verus scarring. Borderline heart size. 07/07/2005 CT of the Chest No evidence of neoplastic process in the chest Left adrenal lesion 07/08/2005 X-Ray of the Spine C3-C5 ACDF C5 poorly visualized due to overlying shoulders 03/02/2006 Mammogram Normal  03/24/2000 X-Ray of the Left Hip No acute abnormality with mild osteoarthritis noted 11/04/2007 MR of the C-Spine  Fusion from C-3 to C-5 has satisfactory appearance  Mild facet degeneration at C2-3 left more than right  without definite neural compression  06/16/2006 X-Ray of Right Hand Negative right tibia and fibula  12/15/2007 Chest X-Ray Cardiac enlargement without heart failure No active cardiopulmonary disease  11/04/2007 MR of the Spine  Fusion from C-# to C-5 has a satisfactory appearance  12/21/2007 X-Ray of the lumbar Spine Clamps on the spinous processes of L4 and L5 A sponge is in place in the region  12/21/2007 Chest X-Ray  PICC line tip in the distal SVC Persistent left lower lobe process and streaky areas of the right lung atelectasis 12/22/2007 2D Echo  12/24/2007 CT of the Abdomen  Bilateral pleural effusions left greater right with left base infiltrate Mild third spacing of fluid 12/24/2007 CT of the Pelvis Left iliopsoas muscle  hematoma and adjacent blood as directed above Sigmoid diverticula  06/09/2008 Mammogram Normal  07/17/2008 VL Arterial /Venous No evidence of DVT  01/16/2009 Right Lower extremity Venous Doppler Ultrasound No evidence of right lower extremity deep vein thrombosis The right GSV is patent  08/01/2009 Mammogram Normal   06/12/2010 Colonoscopy Dr Bosie Clos  Normal 06/26/2010 Bone density  Assessment/Plan  Essential hypertension, benign BP is at goal with quinapril/hctz and baby asa.    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  Not new.  Likely multifactorial as also prediabetic for a long time due to longstanding prednisone.    Will consider gabapentin in future.    Hyperglycemia Due to longstanding prednisone for rheumatoid arthritis.    Hyperlipidemia LDL goal < 100 LDL is at goal with current statin therapy, but TG remain high.  Discussed with her in detail her necessary dietary changes including watching sweets and starches that are contributing to this and her elevating hba1c.  Agrees she will work on this.    Hypertriglyceridemia, essential Improved since last time but not yet at goal.  Watch starches and sweets and recheck  before next visit.   Labs/tests ordered:  Cbc, bmp, flp, hba1c before 4 mo f/u.

## 2012-11-18 NOTE — Assessment & Plan Note (Signed)
Improved since last time but not yet at goal.  Watch starches and sweets and recheck before next visit.

## 2012-11-18 NOTE — Assessment & Plan Note (Signed)
Due to longstanding prednisone for rheumatoid arthritis.

## 2012-11-18 NOTE — Assessment & Plan Note (Signed)
LDL is at goal with current statin therapy, but TG remain high.  Discussed with her in detail her necessary dietary changes including watching sweets and starches that are contributing to this and her elevating hba1c.  Agrees she will work on this.

## 2012-11-18 NOTE — Assessment & Plan Note (Signed)
BP is at goal with quinapril/hctz and baby asa.

## 2013-02-28 ENCOUNTER — Other Ambulatory Visit: Payer: Self-pay | Admitting: Geriatric Medicine

## 2013-02-28 MED ORDER — ALENDRONATE SODIUM 70 MG PO TABS: 70.0000 mg | ORAL_TABLET | ORAL | Status: DC

## 2013-03-22 ENCOUNTER — Other Ambulatory Visit: Payer: Medicare Other

## 2013-03-22 DIAGNOSIS — I10 Essential (primary) hypertension: Secondary | ICD-10-CM

## 2013-03-22 DIAGNOSIS — R739 Hyperglycemia, unspecified: Secondary | ICD-10-CM

## 2013-03-22 DIAGNOSIS — E785 Hyperlipidemia, unspecified: Secondary | ICD-10-CM

## 2013-03-22 DIAGNOSIS — M0550 Rheumatoid polyneuropathy with rheumatoid arthritis of unspecified site: Secondary | ICD-10-CM

## 2013-03-22 DIAGNOSIS — E781 Pure hyperglyceridemia: Secondary | ICD-10-CM

## 2013-03-23 LAB — CBC WITH DIFFERENTIAL/PLATELET
Basophils Absolute: 0 10*3/uL (ref 0.0–0.2)
Basos: 1 % (ref 0–3)
Eos: 2 % (ref 0–5)
Eosinophils Absolute: 0.1 10*3/uL (ref 0.0–0.4)
HCT: 43.3 % (ref 34.0–46.6)
Hemoglobin: 14 g/dL (ref 11.1–15.9)
Immature Grans (Abs): 0 10*3/uL (ref 0.0–0.1)
Immature Granulocytes: 0 % (ref 0–2)
Lymphocytes Absolute: 3.1 10*3/uL (ref 0.7–3.1)
Lymphs: 47 % — ABNORMAL HIGH (ref 14–46)
MCH: 30 pg (ref 26.6–33.0)
MCHC: 32.3 g/dL (ref 31.5–35.7)
MCV: 93 fL (ref 79–97)
Monocytes Absolute: 0.5 10*3/uL (ref 0.1–0.9)
Monocytes: 7 % (ref 4–12)
Neutrophils Absolute: 2.8 10*3/uL (ref 1.4–7.0)
Neutrophils Relative %: 43 % (ref 40–74)
RBC: 4.66 x10E6/uL (ref 3.77–5.28)
RDW: 13.3 % (ref 12.3–15.4)
WBC: 6.6 10*3/uL (ref 3.4–10.8)

## 2013-03-23 LAB — BASIC METABOLIC PANEL
BUN/Creatinine Ratio: 21 (ref 11–26)
BUN: 15 mg/dL (ref 8–27)
CO2: 31 mmol/L — ABNORMAL HIGH (ref 18–29)
Calcium: 10.3 mg/dL — ABNORMAL HIGH (ref 8.6–10.2)
Chloride: 104 mmol/L (ref 97–108)
Creatinine, Ser: 0.7 mg/dL (ref 0.57–1.00)
GFR calc Af Amer: 92 mL/min/{1.73_m2} (ref >59)
GFR calc non Af Amer: 80 mL/min/{1.73_m2} (ref >59)
Glucose: 85 mg/dL (ref 65–99)
Potassium: 5.1 mmol/L (ref 3.5–5.2)
Sodium: 147 mmol/L — ABNORMAL HIGH (ref 134–144)

## 2013-03-23 LAB — LIPID PANEL
Chol/HDL Ratio: 2.9 ratio units (ref 0.0–4.4)
Cholesterol, Total: 252 mg/dL — ABNORMAL HIGH (ref 100–199)
HDL: 86 mg/dL (ref >39)
LDL Calculated: 102 mg/dL — ABNORMAL HIGH (ref 0–99)
Triglycerides: 322 mg/dL — ABNORMAL HIGH (ref 0–149)
VLDL Cholesterol Cal: 64 mg/dL — ABNORMAL HIGH (ref 5–40)

## 2013-03-23 LAB — HEMOGLOBIN A1C
Est. average glucose Bld gHb Est-mCnc: 134 mg/dL
Hgb A1c MFr Bld: 6.3 % — ABNORMAL HIGH (ref 4.8–5.6)

## 2013-03-24 ENCOUNTER — Ambulatory Visit: Payer: Medicare Other | Admitting: Internal Medicine

## 2013-03-31 ENCOUNTER — Encounter: Payer: Self-pay | Admitting: Internal Medicine

## 2013-03-31 ENCOUNTER — Ambulatory Visit (INDEPENDENT_AMBULATORY_CARE_PROVIDER_SITE_OTHER): Payer: Medicare Other | Admitting: Internal Medicine

## 2013-03-31 VITALS — BP 136/88 | HR 52 | Temp 98.2°F | Resp 14 | Wt 136.8 lb

## 2013-03-31 DIAGNOSIS — Z23 Encounter for immunization: Secondary | ICD-10-CM

## 2013-03-31 DIAGNOSIS — G610 Guillain-Barre syndrome: Secondary | ICD-10-CM

## 2013-03-31 DIAGNOSIS — M359 Systemic involvement of connective tissue, unspecified: Secondary | ICD-10-CM

## 2013-03-31 DIAGNOSIS — M0550 Rheumatoid polyneuropathy with rheumatoid arthritis of unspecified site: Secondary | ICD-10-CM

## 2013-03-31 DIAGNOSIS — R739 Hyperglycemia, unspecified: Secondary | ICD-10-CM

## 2013-03-31 DIAGNOSIS — Z0000 Encounter for general adult medical examination without abnormal findings: Secondary | ICD-10-CM

## 2013-03-31 DIAGNOSIS — E781 Pure hyperglyceridemia: Secondary | ICD-10-CM

## 2013-03-31 DIAGNOSIS — I10 Essential (primary) hypertension: Secondary | ICD-10-CM

## 2013-03-31 DIAGNOSIS — R7309 Other abnormal glucose: Secondary | ICD-10-CM

## 2013-03-31 MED ORDER — ZOSTER VACCINE LIVE 19400 UNT/0.65ML ~~LOC~~ SOLR: 0.6500 mL | Freq: Once | SUBCUTANEOUS | Status: DC

## 2013-03-31 NOTE — Progress Notes (Signed)
Patient ID: [name], female   DOB: [birth year], 79 y.o.   MRN: [id number] Location:  [facility]  Code Status: DNR  Allergies  Allergen Reactions  . Penicillins     Chief Complaint  Patient presents with  . Medical Managment of Chronic Issues    HPI: Patient is a 79 y.o. white female with h/o senile osteoporosis, RA, chronic hip pain, abdominal obesity, DMII controlled with diet, hypertriglyceridemia, and depression was seen in the office today for medical management of her chronic illnesses.  Is bumping into things.  Is bruised up on elbows and wrists.  Has small apt.  Balance is about the same.  Has had increased trouble with hip and on regimen of prednisone.  Is going to get PT through rheumatology.    Forgets her cholesterol pill sometimes--triglycerides are particularly high.  Likes her sweets.  Sugar average ok at 6.3 given her age and comorbidities.    BP is good.  Admits to drinking a lot of V8 which she knows is salty.    Review of Systems:  Review of Systems  Constitutional: Negative for fever, chills, weight loss and malaise/fatigue.  HENT: Negative for congestion.   Eyes: Negative for blurred vision.  Respiratory: Negative for cough and shortness of breath.   Cardiovascular: Negative for chest pain and leg swelling.  Gastrointestinal: Negative for heartburn, abdominal pain and constipation.  Genitourinary: Negative for dysuria.  Musculoskeletal: Positive for back pain and joint pain. Negative for falls.  Skin: Negative for rash.  Neurological: Positive for tingling and sensory change. Negative for dizziness, weakness and headaches.  Endo/Heme/Allergies: Does not bruise/bleed easily.  Psychiatric/Behavioral: Negative for depression and memory loss. The patient is not nervous/anxious.      Past Medical History  Diagnosis Date  . Arthritis     Osteoarthritis  . GERD (gastroesophageal reflux disease)   . Hyperlipidemia    . Depression   . Osteopenia     Past Surgical History  Procedure Laterality Date  . Appendectomy  1936  . Tonsillectomy  1941  . Finger surgery  1989  . Cervical fusion  2004  . Spine surgery  2009    Back Fusion  . Colon surgery  06/12/2010    Colonoscopy   Social History:   reports that she has quit smoking. She does not have any smokeless tobacco history on file. She reports that she does not drink alcohol. Her drug history is not on file.  No family history on file.  Medications: Patient's Medications  New Prescriptions   No medications on file  Previous Medications   ALENDRONATE (FOSAMAX) 70 MG TABLET    Take 1 tablet (70 mg total) by mouth every 7 (seven) days. Take one tablet by mouth weekly.  Stay upright for 1 hour after taking.   ASPIRIN 81 MG TABLET    Take 81 mg by mouth daily.   CHOLECALCIFEROL (VITAMIN D) 1000 UNITS TABLET    Take 1,000 Units by mouth 2 (two) times daily. Take one tablet twice daily for vitamin D supplement.   GABAPENTIN (NEURONTIN) 100 MG CAPSULE    Take 100 mg by mouth 3 (three) times daily. Take 1 capsule every 8 hours as needed for nerve pain.   LATANOPROST (XALATAN) 0.005 % OPHTHALMIC SOLUTION       LUBIPROSTONE (AMITIZA) 8 MCG CAPSULE    Take 8 mcg by mouth 2 (two) times daily with a meal. Take one capsule twice a day  for constipation.   MILNACIPRAN HCL (SAVELLA) 25 MG TABS    Take 25 mg by mouth 2 (two) times daily. Take one tablet twice daily for fibromyalgia.   OXYCODONE-ACETAMINOPHEN (PERCOCET/ROXICET) 5-325 MG PER TABLET    Take 1 tablet by mouth 3 (three) times daily as needed for pain. Take one tablet three times daily as needed for pain.   PANTOPRAZOLE (PROTONIX) 40 MG TABLET    Take 40 mg by mouth daily. Take one tablet once daily for acid reflux.   PREDNISONE (DELTASONE) 5 MG TABLET       PREDNISONE (DELTASONE) 5 MG TABLET    taper   QUINAPRIL-HYDROCHLOROTHIAZIDE (ACCURETIC) 20-12.5 MG PER TABLET    Take 1 tablet by mouth daily.  Take one tablet once a day.   SIMVASTATIN (ZOCOR) 20 MG TABLET    Take 20 mg by mouth daily. Take one tablet once daily for cholesterol.   ZOSTER VACCINE LIVE, PF, (ZOSTAVAX) 40981 UNT/0.65ML INJECTION    Inject 0.65 mLs into the skin once. Inject 0.65 mL subcutaneously.  Modified Medications   No medications on file  Discontinued Medications   No medications on file     Physical Exam: Filed Vitals:   03/31/13 1115  BP: 136/88  Pulse: 52  Temp: 98.2 F (36.8 C)  TempSrc: Oral  Resp: 14  Weight: 136 lb 12.8 oz (62.052 kg)  Physical Exam  Constitutional: She is oriented to person, place, and time.  White female with abdominal obesity  HENT:  Head: Normocephalic and atraumatic.  Cardiovascular: Normal rate, regular rhythm, normal heart sounds and intact distal pulses.   Pulmonary/Chest: Effort normal and breath sounds normal. No respiratory distress.  Abdominal: Soft. Bowel sounds are normal. She exhibits no distension. There is no tenderness.  Musculoskeletal: She exhibits no edema.  Ambulates with cane, unsteady but will not use walker  Neurological: She is alert and oriented to person, place, and time.  Skin: Skin is warm and dry.    Labs reviewed: Basic Metabolic Panel:  Recent Labs  19/14/78 1029 03/22/13 0943  NA 144 147*  K 4.8 5.1  CL 104 104  CO2 30* 31*  GLUCOSE 84 85  BUN 12 15  CREATININE 0.66 0.70  CALCIUM 10.0 10.3*   Liver Function Tests:  Recent Labs  11/16/12 1029  AST 15  ALT 21  ALKPHOS 50  BILITOT 0.3  PROT 5.9*  CBC:  Recent Labs  03/22/13 0943  WBC 6.6  NEUTROABS 2.8  HGB 14.0  HCT 43.3  MCV 93   Lipid Panel:  Recent Labs  11/16/12 1029 03/22/13 0943  HDL 75 86  LDLCALC 79 102*  TRIG 251* 322*  CHOLHDL 2.7 2.9   Lab Results  Component Value Date   HGBA1C 6.3* 03/22/2013    Assessment/Plan 1. Preventative health care -up to date on regular vaccinations, mammogram, bone density, no longer needs cscope as  asymptomatic and 79 yo - zoster vaccine live, PF, (ZOSTAVAX) 29562 UNT/0.65ML injection; Inject 19,400 Units into the skin once. Inject 0.65 mL subcutaneously.  Dispense: 1 each; Refill: 0  2. Hyperglycemia --f/u hba1c --encouraged her to watch sweets and starches to lower her sugar avg and TG  3. Hypertriglyceridemia, essential -f/u TG with Lipid panel; Future -cont current therapy  4. Essential hypertension, benign At goal with current therapy - Basic metabolic panel; Future to follow up cr and K  5. 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 Need for  shingles vaccine Reviewed getting zostavax done.  She already has this prescription.  Labs/tests ordered:  Bmp, flp, hba1c Next appt: 4 mos

## 2013-03-31 NOTE — Patient Instructions (Signed)
Go get your shingles vaccine at the pharmacy.    Watch your starchy foods and sweets.

## 2013-04-07 ENCOUNTER — Other Ambulatory Visit: Payer: Self-pay | Admitting: Geriatric Medicine

## 2013-04-07 MED ORDER — LUBIPROSTONE 8 MCG PO CAPS: 8.0000 ug | ORAL_CAPSULE | Freq: Two times a day (BID) | ORAL | Status: DC

## 2013-04-12 ENCOUNTER — Other Ambulatory Visit: Payer: Self-pay | Admitting: *Deleted

## 2013-04-12 MED ORDER — QUINAPRIL-HYDROCHLOROTHIAZIDE 20-12.5 MG PO TABS: ORAL_TABLET | ORAL | Status: DC

## 2013-04-12 MED ORDER — PANTOPRAZOLE SODIUM 40 MG PO TBEC: DELAYED_RELEASE_TABLET | ORAL | Status: DC

## 2013-05-31 ENCOUNTER — Ambulatory Visit (INDEPENDENT_AMBULATORY_CARE_PROVIDER_SITE_OTHER): Payer: Medicare Other

## 2013-05-31 DIAGNOSIS — Z23 Encounter for immunization: Secondary | ICD-10-CM

## 2013-07-18 ENCOUNTER — Other Ambulatory Visit: Payer: Self-pay | Admitting: *Deleted

## 2013-07-18 MED ORDER — SIMVASTATIN 20 MG PO TABS: ORAL_TABLET | ORAL | Status: DC

## 2013-08-03 ENCOUNTER — Other Ambulatory Visit: Payer: Self-pay | Admitting: Rheumatology

## 2013-08-03 ENCOUNTER — Ambulatory Visit
Admission: RE | Admit: 2013-08-03 | Discharge: 2013-08-03 | Disposition: A | Discharge status: Home / Self Care | Payer: Medicare Other | Source: Ambulatory Visit | Attending: Rheumatology | Admitting: Rheumatology

## 2013-08-03 DIAGNOSIS — M79651 Pain in right thigh: Secondary | ICD-10-CM

## 2013-08-03 DIAGNOSIS — Z86718 Personal history of other venous thrombosis and embolism: Secondary | ICD-10-CM

## 2013-08-05 ENCOUNTER — Other Ambulatory Visit: Payer: Medicare Other

## 2013-08-05 DIAGNOSIS — I10 Essential (primary) hypertension: Secondary | ICD-10-CM

## 2013-08-05 DIAGNOSIS — E781 Pure hyperglyceridemia: Secondary | ICD-10-CM

## 2013-08-06 LAB — BASIC METABOLIC PANEL
BUN/Creatinine Ratio: 35 — ABNORMAL HIGH (ref 11–26)
BUN: 25 mg/dL (ref 8–27)
CO2: 26 mmol/L (ref 18–29)
Calcium: 10.4 mg/dL — ABNORMAL HIGH (ref 8.6–10.2)
Chloride: 102 mmol/L (ref 97–108)
Creatinine, Ser: 0.72 mg/dL (ref 0.57–1.00)
GFR calc Af Amer: 89 mL/min/{1.73_m2} (ref >59)
GFR calc non Af Amer: 77 mL/min/{1.73_m2} (ref >59)
Glucose: 114 mg/dL — ABNORMAL HIGH (ref 65–99)
Potassium: 5.1 mmol/L (ref 3.5–5.2)
Sodium: 144 mmol/L (ref 134–144)

## 2013-08-06 LAB — HEMOGLOBIN A1C
Est. average glucose Bld gHb Est-mCnc: 126 mg/dL
Hgb A1c MFr Bld: 6 % — ABNORMAL HIGH (ref 4.8–5.6)

## 2013-08-06 LAB — LIPID PANEL
Chol/HDL Ratio: 2.4 ratio units (ref 0.0–4.4)
Cholesterol, Total: 258 mg/dL — ABNORMAL HIGH (ref 100–199)
HDL: 108 mg/dL (ref >39)
LDL Calculated: 115 mg/dL — ABNORMAL HIGH (ref 0–99)
Triglycerides: 177 mg/dL — ABNORMAL HIGH (ref 0–149)
VLDL Cholesterol Cal: 35 mg/dL (ref 5–40)

## 2013-08-08 ENCOUNTER — Ambulatory Visit (INDEPENDENT_AMBULATORY_CARE_PROVIDER_SITE_OTHER): Payer: Medicare Other | Admitting: Internal Medicine

## 2013-08-08 ENCOUNTER — Encounter: Payer: Self-pay | Admitting: Internal Medicine

## 2013-08-08 VITALS — BP 118/86 | HR 66 | Temp 97.6°F | Resp 14 | Ht 60.5 in | Wt 139.8 lb

## 2013-08-08 DIAGNOSIS — F3289 Other specified depressive episodes: Secondary | ICD-10-CM

## 2013-08-08 DIAGNOSIS — F32A Depression, unspecified: Secondary | ICD-10-CM

## 2013-08-08 DIAGNOSIS — I10 Essential (primary) hypertension: Secondary | ICD-10-CM

## 2013-08-08 DIAGNOSIS — F329 Major depressive disorder, single episode, unspecified: Secondary | ICD-10-CM

## 2013-08-08 DIAGNOSIS — M069 Rheumatoid arthritis, unspecified: Secondary | ICD-10-CM

## 2013-08-08 DIAGNOSIS — E781 Pure hyperglyceridemia: Secondary | ICD-10-CM

## 2013-08-08 DIAGNOSIS — M359 Systemic involvement of connective tissue, unspecified: Secondary | ICD-10-CM

## 2013-08-08 DIAGNOSIS — M0550 Rheumatoid polyneuropathy with rheumatoid arthritis of unspecified site: Secondary | ICD-10-CM

## 2013-08-08 MED ORDER — FLUOXETINE HCL 20 MG PO TABS: 20.0000 mg | ORAL_TABLET | Freq: Every day | ORAL | Status: DC

## 2013-08-08 NOTE — Progress Notes (Signed)
Patient ID: [name], female   DOB: 1927/07/10, 79 y.o.   MRN: [id number]   Location:  [facility]  Code Status: DNR   Allergies  Allergen Reactions  . Penicillins     Chief Complaint  Patient presents with  . Annual Exam    CPE--No complaints    HPI: Patient is a 79 y.o. white female seen in the office today for annual exam.  Has no new concerns.    Says she has not fallen.  Has been a little depressed due to a friend with a bad diagnosis.  It is one of her neighbors who has lou gehrig's disease.  Friend is depressed, too, of course.  Stopped savella.  It was making her sweat and get sick to her stomach.  Wants to go back on prozac.    Had a knee problem last week and got an injection in the knee.  Got ultrasound which was negative to rule out dvt.    Review of Systems:  Review of Systems  Constitutional: Negative for fever, chills and malaise/fatigue.  HENT: Negative for hearing loss.   Eyes:       Glaucoma a little worse so now on 3 drops  Respiratory: Negative for shortness of breath.   Cardiovascular: Negative for chest pain.  Gastrointestinal: Negative for constipation, blood in stool and melena.       Stopped amitiza due to cost when in donut hole;  Using align probiotic instead  Genitourinary: Negative for dysuria, urgency and frequency.  Musculoskeletal: Positive for joint pain. Negative for falls.  Skin: Negative for rash.  Neurological: Negative for dizziness, loss of consciousness and weakness.  Endo/Heme/Allergies: Bruises/bleeds easily.  Psychiatric/Behavioral: Positive for depression. Negative for memory loss.     Past Medical History  Diagnosis Date  . Arthritis     Osteoarthritis  . GERD (gastroesophageal reflux disease)   . Hyperlipidemia   . Depression   . Osteopenia     Past Surgical History  Procedure Laterality Date  . Appendectomy  1936  . Tonsillectomy  1941  . Finger surgery  1989  .  Cervical fusion  2004  . Spine surgery  2009    Back Fusion  . Colon surgery  06/12/2010    Colonoscopy    Social History:   reports that she has quit smoking. She does not have any smokeless tobacco history on file. She reports that she does not drink alcohol. Her drug history is not on file.  History reviewed. No pertinent family history.  Medications: Patient's Medications  New Prescriptions   No medications on file  Previous Medications   ALENDRONATE (FOSAMAX) 70 MG TABLET    Take 1 tablet (70 mg total) by mouth every 7 (seven) days. Take one tablet by mouth weekly.  Stay upright for 1 hour after taking.   ASPIRIN 81 MG TABLET    Take 81 mg by mouth daily.   CHOLECALCIFEROL (VITAMIN D) 1000 UNITS TABLET    Take 1,000 Units by mouth 2 (two) times daily. Take one tablet twice daily for vitamin D supplement.   GABAPENTIN (NEURONTIN) 100 MG CAPSULE    Take 100 mg by mouth 3 (three) times daily. Take 1 capsule every 8 hours as needed for nerve pain.   LATANOPROST (XALATAN) 0.005 % OPHTHALMIC SOLUTION       MILNACIPRAN HCL (SAVELLA) 25 MG TABS    Take 25 mg by mouth 2 (two) times daily. Take one  tablet twice daily for fibromyalgia.   OXYCODONE-ACETAMINOPHEN (PERCOCET/ROXICET) 5-325 MG PER TABLET    Take 1 tablet by mouth 3 (three) times daily as needed for pain. Take one tablet three times daily as needed for pain.   PANTOPRAZOLE (PROTONIX) 40 MG TABLET    Take one tablet once daily for acid reflux.   PREDNISONE (DELTASONE) 5 MG TABLET    Take one tablet once daily   QUINAPRIL-HYDROCHLOROTHIAZIDE (ACCURETIC) 20-12.5 MG PER TABLET    Take one tablet once a day for blood pressure   SIMVASTATIN (ZOCOR) 20 MG TABLET    Take one tablet once daily for cholesterol.  Modified Medications   No medications on file  Discontinued Medications   LUBIPROSTONE (AMITIZA) 8 MCG CAPSULE    Take 1 capsule (8 mcg total) by mouth 2 (two) times daily with a meal. Take one capsule twice a day for constipation.     PREDNISONE (DELTASONE) 5 MG TABLET    taper   ZOSTER VACCINE LIVE, PF, (ZOSTAVAX) 16109 UNT/0.65ML INJECTION    Inject 19,400 Units into the skin once. Inject 0.65 mL subcutaneously.     Physical Exam: Filed Vitals:   08/08/13 1356  BP: 118/86  Pulse: 66  Temp: 97.6 F (36.4 C)  TempSrc: Oral  Resp: 14  Height: 5' 0.5" (1.537 m)  Weight: 139 lb 12.8 oz (63.413 kg)  Physical Exam  Constitutional: She is oriented to person, place, and time. She appears well-developed and well-nourished. No distress.  HENT:  Head: Normocephalic and atraumatic.  Right Ear: External ear normal.  Left Ear: External ear normal.  Nose: Nose normal.  Mouth/Throat: Oropharynx is clear and moist. No oropharyngeal exudate.  Eyes: Conjunctivae and EOM are normal. Pupils are equal, round, and reactive to light.  Neck: Normal range of motion. Neck supple. No JVD present. No tracheal deviation present. No thyromegaly present.  Cardiovascular: Normal rate, regular rhythm, normal heart sounds and intact distal pulses.  Exam reveals no gallop and no friction rub.   No murmur heard. Pulmonary/Chest: Effort normal and breath sounds normal. No respiratory distress. Right breast exhibits no inverted nipple, no mass, no nipple discharge, no skin change and no tenderness. Left breast exhibits no inverted nipple, no mass, no nipple discharge, no skin change and no tenderness.  Abdominal: Soft. Bowel sounds are normal. She exhibits no distension.  Musculoskeletal: She exhibits no edema and no tenderness.  Walks with cane  Lymphadenopathy:    She has no cervical adenopathy.  Neurological: She is alert and oriented to person, place, and time. She has normal reflexes. No cranial nerve deficit.  Skin: Skin is warm and dry.  Psychiatric: She has a normal mood and affect.    Labs reviewed: Basic Metabolic Panel:  Recent Labs  60/45/40 1029 03/22/13 0943 08/05/13 0818  NA 144 147* 144  K 4.8 5.1 5.1  CL 104 104 102   CO2 30* 31* 26  GLUCOSE 84 85 114*  BUN 12 15 25   CREATININE 0.66 0.70 0.72  CALCIUM 10.0 10.3* 10.4*   Liver Function Tests:  Recent Labs  11/16/12 1029  AST 15  ALT 21  ALKPHOS 50  BILITOT 0.3  PROT 5.9*  CBC:  Recent Labs  03/22/13 0943  WBC 6.6  NEUTROABS 2.8  HGB 14.0  HCT 43.3  MCV 93   Lipid Panel:  Recent Labs  11/16/12 1029 03/22/13 0943 08/05/13 0818  HDL 75 86 108  LDLCALC 79 981* 115*  TRIG 251* 322* 177*  CHOLHDL 2.7 2.9 2.4   Lab Results  Component Value Date   HGBA1C 6.0* 08/05/2013   Assessment/Plan 1. Hypertriglyceridemia, essential -improved - Lipid panel; Future - Hemoglobin A1c; Future  2. Essential hypertension, benign -at goal, no changes needed - Comprehensive metabolic panel; Future  3. 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 Rheumatoid arthritis -continues on steroids longstanding and percocet--takes fosamax and vitamin D for bone protection  5. Depression -did not tolerate savella due to sweating, feels she did better with prozac and actually wants to restart it b/c of a friend of hers being ill - FLUoxetine (PROZAC) 20 MG tablet; Take 1 tablet (20 mg total) by mouth daily.  Dispense: 90 tablet; Refill: 3  Labs/tests ordered:   Orders Placed This Encounter  Procedures  . CBC with Differential    Standing Status: Future     Number of Occurrences:      Standing Expiration Date: 08/08/2014  . Comprehensive metabolic panel    Standing Status: Future     Number of Occurrences:      Standing Expiration Date: 08/08/2014  . Lipid panel    Standing Status: Future     Number of Occurrences:      Standing Expiration Date: 08/08/2014  . Hemoglobin A1c    Standing Status: Future     Number of Occurrences:      Standing Expiration Date: 08/08/2014   Next appt:  6 mo labs before

## 2014-01-02 DIAGNOSIS — H401190 Primary open-angle glaucoma, unspecified eye, stage unspecified: Secondary | ICD-10-CM | POA: Insufficient documentation

## 2014-01-12 ENCOUNTER — Ambulatory Visit: Payer: Self-pay | Admitting: Internal Medicine

## 2014-03-03 ENCOUNTER — Emergency Department (HOSPITAL_COMMUNITY): Payer: Medicare Other

## 2014-03-03 ENCOUNTER — Emergency Department (HOSPITAL_COMMUNITY)
Admission: EM | Admit: 2014-03-03 | Discharge: 2014-03-03 | Disposition: A | Discharge status: Home / Self Care | Payer: Medicare Other | Attending: Emergency Medicine | Admitting: Emergency Medicine

## 2014-03-03 ENCOUNTER — Encounter (HOSPITAL_COMMUNITY): Payer: Self-pay | Admitting: Emergency Medicine

## 2014-03-03 DIAGNOSIS — S99929A Unspecified injury of unspecified foot, initial encounter: Secondary | ICD-10-CM

## 2014-03-03 DIAGNOSIS — IMO0002 Reserved for concepts with insufficient information to code with codable children: Secondary | ICD-10-CM

## 2014-03-03 DIAGNOSIS — S0003XA Contusion of scalp, initial encounter: Secondary | ICD-10-CM | POA: Insufficient documentation

## 2014-03-03 DIAGNOSIS — E785 Hyperlipidemia, unspecified: Secondary | ICD-10-CM | POA: Insufficient documentation

## 2014-03-03 DIAGNOSIS — Z79899 Other long term (current) drug therapy: Secondary | ICD-10-CM | POA: Insufficient documentation

## 2014-03-03 DIAGNOSIS — Z87891 Personal history of nicotine dependence: Secondary | ICD-10-CM | POA: Insufficient documentation

## 2014-03-03 DIAGNOSIS — S8990XA Unspecified injury of unspecified lower leg, initial encounter: Secondary | ICD-10-CM | POA: Insufficient documentation

## 2014-03-03 DIAGNOSIS — Y929 Unspecified place or not applicable: Secondary | ICD-10-CM | POA: Insufficient documentation

## 2014-03-03 DIAGNOSIS — F329 Major depressive disorder, single episode, unspecified: Secondary | ICD-10-CM | POA: Insufficient documentation

## 2014-03-03 DIAGNOSIS — Z880 Allergy status to penicillin: Secondary | ICD-10-CM | POA: Insufficient documentation

## 2014-03-03 DIAGNOSIS — W1809XA Striking against other object with subsequent fall, initial encounter: Secondary | ICD-10-CM | POA: Insufficient documentation

## 2014-03-03 DIAGNOSIS — Y939 Activity, unspecified: Secondary | ICD-10-CM | POA: Insufficient documentation

## 2014-03-03 DIAGNOSIS — M1990 Unspecified osteoarthritis, unspecified site: Secondary | ICD-10-CM | POA: Insufficient documentation

## 2014-03-03 DIAGNOSIS — Z7982 Long term (current) use of aspirin: Secondary | ICD-10-CM | POA: Insufficient documentation

## 2014-03-03 DIAGNOSIS — Z23 Encounter for immunization: Secondary | ICD-10-CM | POA: Insufficient documentation

## 2014-03-03 DIAGNOSIS — K219 Gastro-esophageal reflux disease without esophagitis: Secondary | ICD-10-CM | POA: Insufficient documentation

## 2014-03-03 DIAGNOSIS — S058X9A Other injuries of unspecified eye and orbit, initial encounter: Secondary | ICD-10-CM | POA: Insufficient documentation

## 2014-03-03 DIAGNOSIS — S1093XA Contusion of unspecified part of neck, initial encounter: Secondary | ICD-10-CM

## 2014-03-03 DIAGNOSIS — F3289 Other specified depressive episodes: Secondary | ICD-10-CM | POA: Insufficient documentation

## 2014-03-03 DIAGNOSIS — W06XXXA Fall from bed, initial encounter: Secondary | ICD-10-CM | POA: Insufficient documentation

## 2014-03-03 DIAGNOSIS — S99919A Unspecified injury of unspecified ankle, initial encounter: Secondary | ICD-10-CM | POA: Insufficient documentation

## 2014-03-03 DIAGNOSIS — S0083XA Contusion of other part of head, initial encounter: Secondary | ICD-10-CM | POA: Insufficient documentation

## 2014-03-03 DIAGNOSIS — R55 Syncope and collapse: Secondary | ICD-10-CM | POA: Insufficient documentation

## 2014-03-03 LAB — URINALYSIS, ROUTINE W REFLEX MICROSCOPIC
Bilirubin Urine: NEGATIVE
Glucose, UA: NEGATIVE mg/dL
Ketones, ur: NEGATIVE mg/dL
Leukocytes, UA: NEGATIVE
Nitrite: NEGATIVE
Protein, ur: NEGATIVE mg/dL
Specific Gravity, Urine: 1.014 (ref 1.005–1.030)
Urobilinogen, UA: 0.2 mg/dL (ref 0.0–1.0)
pH: 7.5 (ref 5.0–8.0)

## 2014-03-03 LAB — CBC WITH DIFFERENTIAL/PLATELET
Basophils Absolute: 0 10*3/uL (ref 0.0–0.1)
Basophils Relative: 0 % (ref 0–1)
Eosinophils Absolute: 0.1 10*3/uL (ref 0.0–0.7)
Eosinophils Relative: 2 % (ref 0–5)
HCT: 42.7 % (ref 36.0–46.0)
Hemoglobin: 13.8 g/dL (ref 12.0–15.0)
Lymphocytes Relative: 27 % (ref 12–46)
Lymphs Abs: 2.2 10*3/uL (ref 0.7–4.0)
MCH: 29.6 pg (ref 26.0–34.0)
MCHC: 32.3 g/dL (ref 30.0–36.0)
MCV: 91.6 fL (ref 78.0–100.0)
Monocytes Absolute: 0.7 10*3/uL (ref 0.1–1.0)
Monocytes Relative: 8 % (ref 3–12)
Neutro Abs: 5.2 10*3/uL (ref 1.7–7.7)
Neutrophils Relative %: 63 % (ref 43–77)
Platelets: 217 10*3/uL (ref 150–400)
RBC: 4.66 MIL/uL (ref 3.87–5.11)
RDW: 13.1 % (ref 11.5–15.5)
WBC: 8.2 10*3/uL (ref 4.0–10.5)

## 2014-03-03 LAB — TROPONIN I: Troponin I: <0.3 ng/mL (ref <0.30)

## 2014-03-03 LAB — BASIC METABOLIC PANEL
Anion gap: 12 (ref 5–15)
BUN: 15 mg/dL (ref 6–23)
CO2: 31 mEq/L (ref 19–32)
Calcium: 10.3 mg/dL (ref 8.4–10.5)
Chloride: 101 mEq/L (ref 96–112)
Creatinine, Ser: 0.78 mg/dL (ref 0.50–1.10)
GFR calc Af Amer: 86 mL/min — ABNORMAL LOW (ref >90)
GFR calc non Af Amer: 74 mL/min — ABNORMAL LOW (ref >90)
Glucose, Bld: 98 mg/dL (ref 70–99)
Potassium: 3.9 mEq/L (ref 3.7–5.3)
Sodium: 144 mEq/L (ref 137–147)

## 2014-03-03 LAB — URINE MICROSCOPIC-ADD ON

## 2014-03-03 MED ORDER — TETANUS-DIPHTH-ACELL PERTUSSIS 5-2.5-18.5 LF-MCG/0.5 IM SUSP
0.5000 mL | Freq: Once | INTRAMUSCULAR | Status: AC
  Administered 2014-03-03: 0.5 mL via INTRAMUSCULAR
  Filled 2014-03-03: qty 0.5

## 2014-03-03 MED ORDER — BACITRACIN 500 UNIT/GM EX OINT
1.0000 "application " | TOPICAL_OINTMENT | Freq: Once | CUTANEOUS | Status: AC
  Administered 2014-03-03: 1 via TOPICAL
  Filled 2014-03-03: qty 0.9

## 2014-03-03 MED ORDER — OXYCODONE HCL 5 MG PO TABS
5.0000 mg | ORAL_TABLET | Freq: Once | ORAL | Status: AC
  Administered 2014-03-03: 5 mg via ORAL
  Filled 2014-03-03: qty 1

## 2014-03-03 NOTE — ED Provider Notes (Signed)
CSN: [id number]     Arrival date & time 03/03/14  8144 History   None    Chief Complaint  Patient presents with  . Fall  . Head Laceration     (Consider location/radiation/quality/duration/timing/severity/associated sxs/prior Treatment) HPI  [name] is a 80 y.o. female brought in by EMS after patient woke up on the floor next to her bed. She seems that she hit the nightstand and has a right frontal contusion and laceration. Last tetanus shot is unknown. Patient does not remember getting up in the night. She does not remember the fall. She denies any loss of bowel or bladder control, any history of similar, any chest pain, shortness of breath, nausea vomiting, change in vision, dysarthria, ataxia abdominal pain. She endorses a right knee pain and swelling. Denies cervicalgia. Patient has several prior cervical spinal fusions. No blood thinners; patient takes a daily low-dose aspirin.  Past Medical History  Diagnosis Date  . Arthritis     Osteoarthritis  . GERD (gastroesophageal reflux disease)   . Hyperlipidemia   . Depression   . Osteopenia    Past Surgical History  Procedure Laterality Date  . Appendectomy  1936  . Tonsillectomy  1941  . Finger surgery  1989  . Cervical fusion  2004  . Spine surgery  2009    Back Fusion  . Colon surgery  06/12/2010    Colonoscopy   History reviewed. No pertinent family history. History  Substance Use Topics  . Smoking status: Former Research scientist (life sciences)  . Smokeless tobacco: Not on file  . Alcohol Use: No   OB History   Grav Para Term Preterm Abortions TAB SAB Ect Mult Living                 Review of Systems  10 systems reviewed and found to be negative, except as noted in the HPI.   Allergies  Penicillins  Home Medications   Prior to Admission medications   Medication Sig Start Date End Date Taking? Authorizing Provider  alendronate (FOSAMAX) 70 MG tablet Take 1 tablet (70 mg total) by mouth every 7 (seven) days. Take one tablet  by mouth weekly.  Stay upright for 1 hour after taking. 02/28/13  Yes Pricilla Larsson, NP  aspirin 81 MG tablet Take 81 mg by mouth daily.   Yes Historical Provider, MD  brimonidine (ALPHAGAN) 0.15 % ophthalmic solution Place 1 drop into both eyes 2 (two) times daily.  01/25/14  Yes Historical Provider, MD  cholecalciferol (VITAMIN D) 1000 UNITS tablet Take 1,000 Units by mouth 2 (two) times daily. Take one tablet twice daily for vitamin D supplement.   Yes Historical Provider, MD  dorzolamide-timolol (COSOPT) 22.3-6.8 MG/ML ophthalmic solution Place 1 drop into both eyes daily.   Yes Historical Provider, MD  FLUoxetine (PROZAC) 20 MG tablet Take 1 tablet (20 mg total) by mouth daily. 08/08/13  Yes Tiffany L Reed, DO  gabapentin (NEURONTIN) 100 MG capsule Take 100 mg by mouth 3 (three) times daily. Take 1 capsule every 8 hours as needed for nerve pain.   Yes Historical Provider, MD  latanoprost (XALATAN) 0.005 % ophthalmic solution Place 1 drop into both eyes at bedtime.  08/31/12  Yes Historical Provider, MD  oxyCODONE-acetaminophen (PERCOCET/ROXICET) 5-325 MG per tablet Take 1 tablet by mouth 3 (three) times daily as needed for pain. Take one tablet three times daily as needed for pain.   Yes Historical Provider, MD  pantoprazole (PROTONIX) 40 MG tablet Take 40 mg  by mouth daily.   Yes Historical Provider, MD  predniSONE (DELTASONE) 5 MG tablet Take 5 mg by mouth daily.  11/06/12  Yes Historical Provider, MD  quinapril-hydrochlorothiazide (ACCURETIC) 20-12.5 MG per tablet Take 1 tablet by mouth daily.   Yes Historical Provider, MD  simvastatin (ZOCOR) 20 MG tablet Take 20 mg by mouth daily.   Yes Historical Provider, MD   BP 131/69  Pulse 73  Temp(Src) 98.3 F (36.8 C) (Oral)  Resp 16  SpO2 99% Physical Exam  Nursing note and vitals reviewed. Constitutional: She is oriented to person, place, and time. She appears well-developed and well-nourished. No distress.  HENT:  Head: Normocephalic.   Mouth/Throat: Oropharynx is clear and moist.  Large contusion to right for head with 1 cm full thickness laceration at the right eyebrow.  No tenderness to palpation or crepitance in the bilateral orbital rims.   Extraocular movement is intact with no pain or diplopia. There is no tenderness to palpation or swelling over the nasal bridge.  No abnormal oto/hinorrhea.  Eyes: Conjunctivae and EOM are normal. Pupils are equal, round, and reactive to light.  Neck: Normal range of motion.  Mild midline tenderness to palpation along the lower cervical spine. No step-offs appreciated.  Cardiovascular: Normal rate, regular rhythm and intact distal pulses.   Pulmonary/Chest: Effort normal and breath sounds normal. No stridor. No respiratory distress. She has no wheezes. She has no rales. She exhibits no tenderness.  Abdominal: Soft. Bowel sounds are normal. She exhibits no distension and no mass. There is no tenderness. There is no rebound and no guarding.  Musculoskeletal: Normal range of motion. She exhibits edema and tenderness.  Right knee with erythema and swelling. Excellent range of motion. Anterior and posterior drawer with no abnormal laxity. Stable to valgus and varus stress. Distally neurovascularly intact.  Neurological: She is alert and oriented to person, place, and time.  Psychiatric: She has a normal mood and affect.    ED Course  Procedures (including critical care time)  LACERATION REPAIR Performed by: Monico Blitz Consent: Verbal consent obtained. Risks and benefits: risks, benefits and alternatives were discussed Consent given by: patient Patient identity confirmed: Wrist band   Wound explored to depth in good light on a bloodless field, with no foreign bodies seen or palpated.  Prepped and draped in normal sterile fashion    Tetanus: tDap Given  Laceration Location: left eyelid  Laceration Length: 2.5 cm  Anesthesia: local  Local anesthetic: 2% with  epinephrine  Anesthetic total: 2 ml  Irrigation method: Low Pressure  Amount of cleaning: 1L sterile NS  Skin closure:  Closed in 2 layers, inner layer is 5-0 Vicryl Rapide, superficial layer is 6-0 Prolene  Number of sutures: 1 subcuticular layer, 7 superficial layer  Technique: Combination of Corner stitch in simple interrupted   Patient tolerance: Patient tolerated the procedure well with no immediate complications.   Antibx ointment applied. Instructions for care discussed verbally and patient provided with additional written instructions for homecare and f/u.  Labs Review Labs Reviewed  BASIC METABOLIC PANEL - Abnormal; Notable for the following:    GFR calc non Af Amer 74 (*)    GFR calc Af Amer 86 (*)    All other components within normal limits  URINALYSIS, ROUTINE W REFLEX MICROSCOPIC - Abnormal; Notable for the following:    Hgb urine dipstick SMALL (*)    All other components within normal limits  CBC WITH DIFFERENTIAL  TROPONIN I  URINE MICROSCOPIC-ADD ON  Imaging Review Dg Chest 2 View  03/03/2014   CLINICAL DATA:  Fall.  Knee pain.  EXAM: CHEST  2 VIEW  COMPARISON:  12/24/2007.  FINDINGS: Mediastinum and hilar structures are normal. Subsegmental atelectasis in the lung bases. Cardiomegaly with normal pulmonary vascularity. Small left pleural effusion versus pleural scarring. No pneumothorax. No acute osseus abnormality .  IMPRESSION: 1. Cardiomegaly with normal pulmonary vascularity. 2. Mild bibasilar atelectasis with small left pleural effusion.   Electronically Signed   By: Marcello Moores  Register   On: 03/03/2014 08:26   Ct Head Wo Contrast  03/03/2014   CLINICAL DATA:  Fall with head laceration.  EXAM: CT HEAD WITHOUT CONTRAST  CT CERVICAL SPINE WITHOUT CONTRAST  TECHNIQUE: Multidetector CT imaging of the head and cervical spine was performed following the standard protocol without intravenous contrast. Multiplanar CT image reconstructions of the cervical spine were also  generated.  COMPARISON:  None.  FINDINGS: CT HEAD FINDINGS  There is a small acute right frontal scalp hematoma. Hematoma measures maximal thickness of 4 mm.  Generalized cerebral atrophy with ex vacuo dilatation of the lateral ventricles. Moderate periventricular chronic white matter disease.  Negative for acute intra-axial or extra-axial hemorrhage. No evidence of acute cortically based infarction, midline shift, mass lesion, or mass effect. The skull is intact. There is mucosal thickening of the right sphenoid sinus. No air-fluid levels in the sinuses. Mastoid air cells are clear.  CT CERVICAL SPINE FINDINGS  Cervical spine is imaged from the skullbase through the T2 vertebral body. Patient is status post anterior cervical discectomy and fusion spanning C3 through C5.  There is 3 mm anterolisthesis of C6 on C7. Otherwise the cervical spine vertebral bodies are normally aligned. Disc space narrowing at C5-C6 and C6-C7. There are bilateral facet joint degenerative changes, most prominent on the right at C3-C4 and C4-C5, resulting in moderate right neural foraminal narrowing at these levels. No acute cervical spine fracture. No evidence of epidural hematoma.  The prevertebral soft tissue contour is normal.  IMPRESSION: 1. No evidence of acute bony injury to the cervical spine. 2. Anterior fusion spanning C3 through C5. 3. Facet joint degenerative changes can degenerative disc disease as described above.   Electronically Signed   By: Curlene Dolphin M.D.   On: 03/03/2014 07:39   Ct Cervical Spine Wo Contrast  03/03/2014   CLINICAL DATA:  Fall with head laceration.  EXAM: CT HEAD WITHOUT CONTRAST  CT CERVICAL SPINE WITHOUT CONTRAST  TECHNIQUE: Multidetector CT imaging of the head and cervical spine was performed following the standard protocol without intravenous contrast. Multiplanar CT image reconstructions of the cervical spine were also generated.  COMPARISON:  None.  FINDINGS: CT HEAD FINDINGS  There is a small  acute right frontal scalp hematoma. Hematoma measures maximal thickness of 4 mm.  Generalized cerebral atrophy with ex vacuo dilatation of the lateral ventricles. Moderate periventricular chronic white matter disease.  Negative for acute intra-axial or extra-axial hemorrhage. No evidence of acute cortically based infarction, midline shift, mass lesion, or mass effect. The skull is intact. There is mucosal thickening of the right sphenoid sinus. No air-fluid levels in the sinuses. Mastoid air cells are clear.  CT CERVICAL SPINE FINDINGS  Cervical spine is imaged from the skullbase through the T2 vertebral body. Patient is status post anterior cervical discectomy and fusion spanning C3 through C5.  There is 3 mm anterolisthesis of C6 on C7. Otherwise the cervical spine vertebral bodies are normally aligned. Disc space narrowing at C5-C6 and  C6-C7. There are bilateral facet joint degenerative changes, most prominent on the right at C3-C4 and C4-C5, resulting in moderate right neural foraminal narrowing at these levels. No acute cervical spine fracture. No evidence of epidural hematoma.  The prevertebral soft tissue contour is normal.  IMPRESSION: 1. No evidence of acute bony injury to the cervical spine. 2. Anterior fusion spanning C3 through C5. 3. Facet joint degenerative changes can degenerative disc disease as described above.   Electronically Signed   By: Curlene Dolphin M.D.   On: 03/03/2014 07:39   Dg Knee Complete 4 Views Right  03/03/2014   CLINICAL DATA:  Status post fall.  Right knee pain and swelling.  EXAM: RIGHT KNEE - COMPLETE 4+ VIEW  COMPARISON:  None.  FINDINGS: There is no acute bony or joint abnormality. Soft tissue swelling is seen anterior to the patella. No joint effusion it is noted. Joint spaces are preserved.  IMPRESSION: Negative for fracture.  Prepatellar soft tissue swelling compatible with bursitis, possibly hemorrhagic, given history of fall.   Electronically Signed   By: Inge Rise M.D.   On: 03/03/2014 08:26     EKG Interpretation   Date/Time:  Friday March 03 2014 06:32:00 EDT Ventricular Rate:  66 PR Interval:  184 QRS Duration: 92 QT Interval:  384 QTC Calculation: 402 R Axis:   -25 Text Interpretation:  Sinus rhythm Low voltage, precordial leads Left  ventricular hypertrophy Anterior Q waves, possibly due to LVH When  compared with ECG of 12/21/2007, No significant change was found Confirmed  by [facility]  MD, DAVID ([id number]) on 03/03/2014 7:29:14 AM      MDM   Final diagnoses:  Syncope, unspecified syncope type  Laceration    Filed Vitals:   03/03/14 0618 03/03/14 0645 03/03/14 0752 03/03/14 0830  BP: 132/60 122/62 147/83 131/69  Pulse: 69 67 79 73  Temp: 98.3 F (36.8 C)     TempSrc: Oral     Resp: 18 12 17 16   SpO2: 98% 95% 98% 99%    Medications  oxyCODONE (Oxy IR/ROXICODONE) immediate release tablet 5 mg (5 mg Oral Given 03/03/14 0823)  Tdap (BOOSTRIX) injection 0.5 mL (0.5 mLs Intramuscular Given 03/03/14 0824)  bacitracin ointment 1 application (1 application Topical Given 03/03/14 0912)    [name] is a 80 y.o. female presenting with syncopal event and laceration to left eye lid. EKG with no arrhythmia or signs of ischemia. Troponin is negative, blood work and urinalysis unremarkable. Chest x-ray, plain film of knee, CT head and C-spine without acute abnormality. Laceration sutured and patient instructed on wound care. Patient states she has pain medicine at home and does not need a prescription.  This is a shared visit with the attending physician who personally evaluated the patient and agrees with the care plan.   Evaluation does not show pathology that would require ongoing emergent intervention or inpatient treatment. Pt is hemodynamically stable and mentating appropriately. Discussed findings and plan with patient/guardian, who agrees with care plan. All questions answered. Return precautions discussed and outpatient follow up  given.     Monico Blitz, PA-C 03/03/14 1023

## 2014-03-03 NOTE — ED Notes (Signed)
Pt currently in Xray. Family at bedside.

## 2014-03-03 NOTE — ED Notes (Signed)
Pt here by [facility] from home s/p fall, rolled out of bed, "not sure cannot remember", head hit night stand, ~1" R forehead lac, denies LOC, denies blood thinners, takes daily ASA, Td unknown, A&Ox4, also mentions R knee pain. Alert, NAD, calm, interactive, participatory. EDPA in room upon arrival, VSS. No dyspnea noted (denies: nv, visual changes, sob or dizziness).

## 2014-03-03 NOTE — ED Notes (Signed)
PA at bedside.

## 2014-03-03 NOTE — ED Notes (Signed)
Patient transported to CT 

## 2014-03-03 NOTE — ED Notes (Signed)
Pt states she is unable to urinate right now but aware urine sample is needed

## 2014-03-03 NOTE — Discharge Instructions (Signed)
Keep wound dry and do not remove dressing for 24 hours if possible. After that, wash gently morning and night (every 12 hours) with soap and water. Use a topical antibiotic ointment and cover with a bandaid or gauze.  °  °Do NOT use rubbing alcohol or hydrogen peroxide, do not soak the area °  °Present to your primary care doctor or the urgent care of your choice, or the ED for suture removal in 7-10 days. °  °Every attempt was made to remove foreign body (contaminants) from the wound.  However, there is always a chance that some may remain in the wound. This can  increase your risk of infection. °  °If you see signs of infection (warmth, redness, tenderness, pus, sharp increase in pain, fever, red streaking in the skin) immediately return to the emergency department. °  °After the wound heals fully, apply sunscreen for 6-12 months to minimize scarring.  ° ° °Laceration Care, Adult °A laceration is a cut or lesion that goes through all layers of the skin and into the tissue just beneath the skin. °TREATMENT  °Some lacerations may not require closure. Some lacerations may not be able to be closed due to an increased risk of infection. It is important to see your caregiver as soon as possible after an injury to minimize the risk of infection and maximize the opportunity for successful closure. °If closure is appropriate, pain medicines may be given, if needed. The wound will be cleaned to help prevent infection. Your caregiver will use stitches (sutures), staples, wound glue (adhesive), or skin adhesive strips to repair the laceration. These tools bring the skin edges together to allow for faster healing and a better cosmetic outcome. However, all wounds will heal with a scar. Once the wound has healed, scarring can be minimized by covering the wound with sunscreen during the day for 1 full year. °HOME CARE INSTRUCTIONS  °For sutures or staples: °· Keep the wound clean and dry. °· If you were given a bandage  (dressing), you should change it at least once a day. Also, change the dressing if it becomes wet or dirty, or as directed by your caregiver. °· Wash the wound with soap and water 2 times a day. Rinse the wound off with water to remove all soap. Pat the wound dry with a clean towel. °· After cleaning, apply a thin layer of the antibiotic ointment as recommended by your caregiver. This will help prevent infection and keep the dressing from sticking. °· You may shower as usual after the first 24 hours. Do not soak the wound in water until the sutures are removed. °· Only take over-the-counter or prescription medicines for pain, discomfort, or fever as directed by your caregiver. °· Get your sutures or staples removed as directed by your caregiver. °For skin adhesive strips: °· Keep the wound clean and dry. °· Do not get the skin adhesive strips wet. You may bathe carefully, using caution to keep the wound dry. °· If the wound gets wet, pat it dry with a clean towel. °· Skin adhesive strips will fall off on their own. You may trim the strips as the wound heals. Do not remove skin adhesive strips that are still stuck to the wound. They will fall off in time. °For wound adhesive: °· You may briefly wet your wound in the shower or bath. Do not soak or scrub the wound. Do not swim. Avoid periods of heavy perspiration until the skin adhesive has fallen off   on its own. After showering or bathing, gently pat the wound dry with a clean towel. °· Do not apply liquid medicine, cream medicine, or ointment medicine to your wound while the skin adhesive is in place. This may loosen the film before your wound is healed. °· If a dressing is placed over the wound, be careful not to apply tape directly over the skin adhesive. This may cause the adhesive to be pulled off before the wound is healed. °· Avoid prolonged exposure to sunlight or tanning lamps while the skin adhesive is in place. Exposure to ultraviolet light in the first  year will darken the scar. °· The skin adhesive will usually remain in place for 5 to 10 days, then naturally fall off the skin. Do not pick at the adhesive film. °You may need a tetanus shot if: °· You cannot remember when you had your last tetanus shot. °· You have never had a tetanus shot. °If you get a tetanus shot, your arm may swell, get red, and feel warm to the touch. This is common and not a problem. If you need a tetanus shot and you choose not to have one, there is a rare chance of getting tetanus. Sickness from tetanus can be serious. °SEEK MEDICAL CARE IF:  °· You have redness, swelling, or increasing pain in the wound. °· You see a red line that goes away from the wound. °· You have yellowish-white fluid (pus) coming from the wound. °· You have a fever. °· You notice a bad smell coming from the wound or dressing. °· Your wound breaks open before or after sutures have been removed. °· You notice something coming out of the wound such as wood or glass. °· Your wound is on your hand or foot and you cannot move a finger or toe. °SEEK IMMEDIATE MEDICAL CARE IF:  °· Your pain is not controlled with prescribed medicine. °· You have severe swelling around the wound causing pain and numbness or a change in color in your arm, hand, leg, or foot. °· Your wound splits open and starts bleeding. °· You have worsening numbness, weakness, or loss of function of any joint around or beyond the wound. °· You develop painful lumps near the wound or on the skin anywhere on your body. °MAKE SURE YOU:  °· Understand these instructions. °· Will watch your condition. °· Will get help right away if you are not doing well or get worse. °Document Released: 08/04/2005 Document Revised: 10/27/2011 Document Reviewed: 01/28/2011 °[employer]® Patient Information ©2015 [employer]. This information is not intended to replace advice given to you by your health care provider. Make sure you discuss any questions you have with your health  care provider. ° ° °

## 2014-03-03 NOTE — ED Notes (Addendum)
Suture cart at bedside 

## 2014-03-04 NOTE — ED Provider Notes (Signed)
Medical screening examination/treatment/procedure(s) were conducted as a shared visit with non-physician practitioner(s) and myself.  I personally evaluated the patient during the encounter.   EKG Interpretation   Date/Time:  Friday March 03 2014 06:32:00 EDT Ventricular Rate:  66 PR Interval:  184 QRS Duration: 92 QT Interval:  384 QTC Calculation: 402 R Axis:   -25 Text Interpretation:  Sinus rhythm Low voltage, precordial leads Left  ventricular hypertrophy Anterior Q waves, possibly due to LVH When  compared with ECG of 12/21/2007, No significant change was found Confirmed  by [facility]  MD, DAVID ([id number]) on 03/03/2014 7:29:14 AM      [name] is a 80 y.o. female hx of arthritis here with fall. She woke up on the floor. She thinks that she might have rolled to the floor in her sleep. Hit head on nightstand and had a R forehead laceration. CT head/neck unremarkable. Labs at baseline and EKG unchanged. Laceration sutured by PA. Tetanus updated. Stable for d/c.    Wandra Arthurs, MD 03/04/14 1058

## 2014-03-06 ENCOUNTER — Ambulatory Visit (INDEPENDENT_AMBULATORY_CARE_PROVIDER_SITE_OTHER): Payer: Medicare Other | Admitting: Internal Medicine

## 2014-03-06 ENCOUNTER — Encounter: Payer: Self-pay | Admitting: Internal Medicine

## 2014-03-06 VITALS — BP 130/78 | HR 80 | Temp 97.8°F | Resp 10 | Wt 139.0 lb

## 2014-03-06 DIAGNOSIS — M0550 Rheumatoid polyneuropathy with rheumatoid arthritis of unspecified site: Secondary | ICD-10-CM

## 2014-03-06 DIAGNOSIS — I10 Essential (primary) hypertension: Secondary | ICD-10-CM

## 2014-03-06 DIAGNOSIS — Z23 Encounter for immunization: Secondary | ICD-10-CM

## 2014-03-06 DIAGNOSIS — F32A Depression, unspecified: Secondary | ICD-10-CM

## 2014-03-06 DIAGNOSIS — M359 Systemic involvement of connective tissue, unspecified: Secondary | ICD-10-CM

## 2014-03-06 DIAGNOSIS — F329 Major depressive disorder, single episode, unspecified: Secondary | ICD-10-CM

## 2014-03-06 DIAGNOSIS — E781 Pure hyperglyceridemia: Secondary | ICD-10-CM

## 2014-03-06 DIAGNOSIS — F3289 Other specified depressive episodes: Secondary | ICD-10-CM

## 2014-03-06 DIAGNOSIS — M069 Rheumatoid arthritis, unspecified: Secondary | ICD-10-CM

## 2014-03-06 NOTE — Progress Notes (Signed)
Patient ID: [name], female   DOB: June 07, 1927, 80 y.o.   MRN: [id number]   Location:  [facility]  Code Status: DNR, hcpoa on file  Allergies  Allergen Reactions  . Penicillins Rash    Chief Complaint  Patient presents with  . Medical Management of Chronic Issues    6 month follow-up   . Hospitalization Follow-up    ER follow-up, seen in ER on Firday for fall, fell out of bed.    HPI: Patient is a 80 y.o. white female seen in the office today for 6 month f/u.    Hit her head on nightstand as she tried to get out of bed in the morning.  Had blood everywhere.  Went by ambulance to ED.  Was there for 5 hours.  Got seven stitches in her eyebrow.  Wants new bandage on her eye.  Left ankle is swollen and bruised.  Had CT cspine, CT head, right knee, and cxr, as well as UA, cbc, bmp all normal.  CBG was normal in ambulance.  A little soreness of right temple only.  Takes pain pretty well she says.  Also bruises so easily.    Review of Systems:  Review of Systems  Constitutional: Negative for fever and malaise/fatigue.  HENT: Positive for hearing loss. Negative for congestion.   Eyes: Negative for blurred vision.  Respiratory: Negative for shortness of breath.   Cardiovascular: Negative for chest pain and leg swelling.  Gastrointestinal: Negative for heartburn and abdominal pain.  Genitourinary: Negative for dysuria.  Musculoskeletal: Positive for joint pain and falls.       Right temple sore  Skin: Negative for rash.  Neurological: Positive for weakness. Negative for dizziness, loss of consciousness and headaches.  Endo/Heme/Allergies: Does not bruise/bleed easily.  Psychiatric/Behavioral: Positive for memory loss.     Past Medical History  Diagnosis Date  . Arthritis     Osteoarthritis  . GERD (gastroesophageal reflux disease)   . Hyperlipidemia   . Depression   . Osteopenia     Past Surgical History  Procedure Laterality  Date  . Appendectomy  1936  . Tonsillectomy  1941  . Finger surgery  1989  . Cervical fusion  2004  . Spine surgery  2009    Back Fusion  . Colon surgery  06/12/2010    Colonoscopy    Social History:   reports that she has quit smoking. She does not have any smokeless tobacco history on file. She reports that she does not drink alcohol or use illicit drugs.  History reviewed. No pertinent family history.  Medications: Patient's Medications  New Prescriptions   No medications on file  Previous Medications   ALENDRONATE (FOSAMAX) 70 MG TABLET    Take 1 tablet (70 mg total) by mouth every 7 (seven) days. Take one tablet by mouth weekly.  Stay upright for 1 hour after taking.   ASPIRIN 81 MG TABLET    Take 81 mg by mouth daily.   BRIMONIDINE (ALPHAGAN) 0.15 % OPHTHALMIC SOLUTION    Place 1 drop into both eyes 2 (two) times daily.    CHOLECALCIFEROL (VITAMIN D) 1000 UNITS TABLET    Take 1,000 Units by mouth 2 (two) times daily. Take one tablet twice daily for vitamin D supplement.   DORZOLAMIDE-TIMOLOL (COSOPT) 22.3-6.8 MG/ML OPHTHALMIC SOLUTION    Place 1 drop into both eyes daily.   FLUOXETINE (PROZAC) 20 MG TABLET    Take 1 tablet (20  mg total) by mouth daily.   GABAPENTIN (NEURONTIN) 100 MG CAPSULE    Take 100 mg by mouth 3 (three) times daily. Take 1 capsule every 8 hours as needed for nerve pain.   LATANOPROST (XALATAN) 0.005 % OPHTHALMIC SOLUTION    Place 1 drop into both eyes at bedtime.    OXYCODONE-ACETAMINOPHEN (PERCOCET/ROXICET) 5-325 MG PER TABLET    Take 1 tablet by mouth 3 (three) times daily as needed for pain. Take one tablet three times daily as needed for pain.   PANTOPRAZOLE (PROTONIX) 40 MG TABLET    Take 40 mg by mouth daily.   PREDNISONE (DELTASONE) 5 MG TABLET    Take 5 mg by mouth daily.    PROBIOTIC PRODUCT (ALIGN PO)    Take by mouth daily.   QUINAPRIL-HYDROCHLOROTHIAZIDE (ACCURETIC) 20-12.5 MG PER TABLET    Take 1 tablet by mouth daily.   SIMVASTATIN (ZOCOR)  20 MG TABLET    Take 20 mg by mouth daily.  Modified Medications   No medications on file  Discontinued Medications   No medications on file     Physical Exam: Filed Vitals:   03/06/14 1435  BP: 130/78  Pulse: 80  Temp: 97.8 F (36.6 C)  TempSrc: Oral  Resp: 10  Weight: 139 lb (63.05 kg)  SpO2: 95%  Physical Exam  Constitutional: She is oriented to person, place, and time. She appears well-developed and well-nourished. No distress.  White female, ambulates with cane  Cardiovascular: Normal rate, regular rhythm, normal heart sounds and intact distal pulses.   Pulmonary/Chest: Effort normal and breath sounds normal. No respiratory distress.  Abdominal: Soft. Bowel sounds are normal. She exhibits no distension and no mass. There is no tenderness.  Musculoskeletal: Normal range of motion.  Neurological: She is alert and oriented to person, place, and time.  Skin: Skin is warm and dry. There is pallor.  Ecchymoses of right knee and right temple  Psychiatric: She has a normal mood and affect.     Labs reviewed: Basic Metabolic Panel:  Recent Labs  03/22/13 0943 08/05/13 0818 03/03/14 0753  NA 147* 144 144  K 5.1 5.1 3.9  CL 104 102 101  CO2 31* 26 31  GLUCOSE 85 114* 98  BUN 15 25 15   CREATININE 0.70 0.72 0.78  CALCIUM 10.3* 10.4* 10.3   Liver Function Tests: No results found for this basename: AST, ALT, ALKPHOS, BILITOT, PROT, ALBUMIN,  in the last 8760 hours No results found for this basename: LIPASE, AMYLASE,  in the last 8760 hours No results found for this basename: AMMONIA,  in the last 8760 hours CBC:  Recent Labs  03/22/13 0943 03/03/14 0753  WBC 6.6 8.2  NEUTROABS 2.8 5.2  HGB 14.0 13.8  HCT 43.3 42.7  MCV 93 91.6  PLT  --  217   Lipid Panel:  Recent Labs  03/22/13 0943 08/05/13 0818  HDL 86 108  LDLCALC 102* 115*  TRIG 322* 177*  CHOLHDL 2.9 2.4   Lab Results  Component Value Date   HGBA1C 6.0* 08/05/2013   No longer wants to get  mammograms.  Assessment/Plan 1. Need for vaccination with 13-polyvalent pneumococcal conjugate vaccine -prevnar given  2. 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 Kathee Delton - Hemoglobin A1c; Future  3. Rheumatoid arthritis -cont current mgt per rheum including perdisone, percocet, neurontin - CBC With differential/Platelet; Future - Comprehensive metabolic panel; Future  4. Hypertriglyceridemia, essential -cont zocor and watching her sweets and starches  a bit  - Hemoglobin A1c; Future - Lipid panel; Future  5. Essential hypertension, benign -cont quinapril/hctz, if BPs are consistently low, will stop this  6. Depression -cont prozac--has been effective for her long term  Labs/tests ordered:   Orders Placed This Encounter  Procedures  . CBC With differential/Platelet    Standing Status: Future     Number of Occurrences:      Standing Expiration Date: 03/07/2015  . Comprehensive metabolic panel    Standing Status: Future     Number of Occurrences:      Standing Expiration Date: 03/07/2015  . Hemoglobin A1c    Standing Status: Future     Number of Occurrences:      Standing Expiration Date: 03/07/2015  . Lipid panel    Standing Status: Future     Number of Occurrences:      Standing Expiration Date: 03/07/2015    Next appt:  6 mos

## 2014-03-10 ENCOUNTER — Encounter (HOSPITAL_COMMUNITY): Payer: Self-pay | Admitting: Emergency Medicine

## 2014-03-10 ENCOUNTER — Emergency Department (HOSPITAL_COMMUNITY)
Admission: EM | Admit: 2014-03-10 | Discharge: 2014-03-10 | Disposition: A | Discharge status: Home / Self Care | Payer: Medicare Other | Source: Home / Self Care | Attending: Family Medicine | Admitting: Family Medicine

## 2014-03-10 DIAGNOSIS — Z4802 Encounter for removal of sutures: Secondary | ICD-10-CM

## 2014-03-10 NOTE — Discharge Instructions (Signed)
Use ointment twice a day for 3-5 days, return as needed.

## 2014-03-10 NOTE — ED Provider Notes (Signed)
CSN: [id number]     Arrival date & time 03/10/14  1035 History   First MD Initiated Contact with Patient 03/10/14 1112     Chief Complaint  Patient presents with  . Suture / Staple Removal   (Consider location/radiation/quality/duration/timing/severity/associated sxs/prior Treatment) Patient is a 80 y.o. female presenting with suture removal. The history is provided by the patient.  Suture / Staple Removal This is a new problem. The current episode started more than 1 week ago (fell out of bed 1 week ago , sutured in ER , mult contusions, lac well healed, no complaints, ). The problem has been rapidly improving. Pertinent negatives include no chest pain, no abdominal pain and no headaches.    Past Medical History  Diagnosis Date  . Arthritis     Osteoarthritis  . GERD (gastroesophageal reflux disease)   . Hyperlipidemia   . Depression   . Osteopenia    Past Surgical History  Procedure Laterality Date  . Appendectomy  1936  . Tonsillectomy  1941  . Finger surgery  1989  . Cervical fusion  2004  . Spine surgery  2009    Back Fusion  . Colon surgery  06/12/2010    Colonoscopy   History reviewed. No pertinent family history. History  Substance Use Topics  . Smoking status: Former Research scientist (life sciences)  . Smokeless tobacco: Not on file  . Alcohol Use: No   OB History   Grav Para Term Preterm Abortions TAB SAB Ect Mult Living                 Review of Systems  Constitutional: Negative.   Cardiovascular: Negative for chest pain.  Gastrointestinal: Negative for abdominal pain.  Skin: Positive for wound.  Neurological: Negative for headaches.    Allergies  Penicillins  Home Medications   Prior to Admission medications   Medication Sig Start Date End Date Taking? Authorizing Provider  alendronate (FOSAMAX) 70 MG tablet Take 1 tablet (70 mg total) by mouth every 7 (seven) days. Take one tablet by mouth weekly.  Stay upright for 1 hour after taking. 02/28/13  Yes Pricilla Larsson, NP   aspirin 81 MG tablet Take 81 mg by mouth daily.   Yes Historical Provider, MD  cholecalciferol (VITAMIN D) 1000 UNITS tablet Take 1,000 Units by mouth 2 (two) times daily. Take one tablet twice daily for vitamin D supplement.   Yes Historical Provider, MD  FLUoxetine (PROZAC) 20 MG tablet Take 1 tablet (20 mg total) by mouth daily. 08/08/13  Yes Tiffany L Reed, DO  gabapentin (NEURONTIN) 100 MG capsule Take 100 mg by mouth 3 (three) times daily. Take 1 capsule every 8 hours as needed for nerve pain.   Yes Historical Provider, MD  latanoprost (XALATAN) 0.005 % ophthalmic solution Place 1 drop into both eyes at bedtime.  08/31/12  Yes Historical Provider, MD  pantoprazole (PROTONIX) 40 MG tablet Take 40 mg by mouth daily.   Yes Historical Provider, MD  predniSONE (DELTASONE) 5 MG tablet Take 5 mg by mouth daily.  11/06/12  Yes Historical Provider, MD  Probiotic Product (ALIGN PO) Take by mouth daily.   Yes Historical Provider, MD  quinapril-hydrochlorothiazide (ACCURETIC) 20-12.5 MG per tablet Take 1 tablet by mouth daily.   Yes Historical Provider, MD  simvastatin (ZOCOR) 20 MG tablet Take 20 mg by mouth daily.   Yes Historical Provider, MD  brimonidine (ALPHAGAN) 0.15 % ophthalmic solution Place 1 drop into both eyes 2 (two) times daily.  01/25/14  Historical Provider, MD  dorzolamide-timolol (COSOPT) 22.3-6.8 MG/ML ophthalmic solution Place 1 drop into both eyes daily.    Historical Provider, MD  oxyCODONE-acetaminophen (PERCOCET/ROXICET) 5-325 MG per tablet Take 1 tablet by mouth 3 (three) times daily as needed for pain. Take one tablet three times daily as needed for pain.    Historical Provider, MD   BP 91/62  Pulse 62  Temp(Src) 98.1 F (36.7 C) (Oral)  Resp 22  Ht 5' (1.524 m)  Wt 136 lb (61.689 kg)  BMI 26.56 kg/m2  SpO2 97% Physical Exam  Nursing note and vitals reviewed. Constitutional: She is oriented to person, place, and time. She appears well-developed and well-nourished.   Musculoskeletal: Normal range of motion.  Neurological: She is alert and oriented to person, place, and time.  Skin: Skin is warm and dry.  Right eyebrow lac well healed, sutures removed. Other benign bruises resolving.    ED Course  Procedures (including critical care time) Labs Review Labs Reviewed - No data to display  Imaging Review No results found.   MDM   1. Visit for suture removal      Billy Fischer, MD 03/10/14 1124

## 2014-03-10 NOTE — ED Notes (Signed)
Pt here for suture removal from right brow.  Wound appears well healed.  No signs of infection.  Pt voices no concerns at this time.

## 2014-03-13 ENCOUNTER — Other Ambulatory Visit: Payer: Self-pay | Admitting: Dermatology

## 2014-04-11 ENCOUNTER — Other Ambulatory Visit: Payer: Self-pay | Admitting: *Deleted

## 2014-04-11 MED ORDER — ALENDRONATE SODIUM 70 MG PO TABS: ORAL_TABLET | ORAL | Status: DC

## 2014-04-11 MED ORDER — QUINAPRIL-HYDROCHLOROTHIAZIDE 20-12.5 MG PO TABS: ORAL_TABLET | ORAL | Status: DC

## 2014-04-11 MED ORDER — PANTOPRAZOLE SODIUM 40 MG PO TBEC: DELAYED_RELEASE_TABLET | ORAL | Status: DC

## 2014-04-11 NOTE — Telephone Encounter (Signed)
[place]

## 2014-05-23 ENCOUNTER — Ambulatory Visit (INDEPENDENT_AMBULATORY_CARE_PROVIDER_SITE_OTHER): Payer: Medicare Other

## 2014-05-23 DIAGNOSIS — Z23 Encounter for immunization: Secondary | ICD-10-CM

## 2014-08-05 ENCOUNTER — Encounter: Payer: Self-pay | Admitting: Internal Medicine

## 2014-08-30 ENCOUNTER — Other Ambulatory Visit: Payer: Self-pay | Admitting: Internal Medicine

## 2014-09-04 ENCOUNTER — Other Ambulatory Visit: Payer: Medicare Other

## 2014-09-04 DIAGNOSIS — M0550 Rheumatoid polyneuropathy with rheumatoid arthritis of unspecified site: Secondary | ICD-10-CM

## 2014-09-04 DIAGNOSIS — E781 Pure hyperglyceridemia: Secondary | ICD-10-CM

## 2014-09-04 DIAGNOSIS — M069 Rheumatoid arthritis, unspecified: Secondary | ICD-10-CM

## 2014-09-05 LAB — COMPREHENSIVE METABOLIC PANEL
ALT: 14 IU/L (ref 0–32)
AST: 20 IU/L (ref 0–40)
Albumin/Globulin Ratio: 2.4 (ref 1.1–2.5)
Albumin: 4.4 g/dL (ref 3.5–4.7)
Alkaline Phosphatase: 54 IU/L (ref 39–117)
BUN/Creatinine Ratio: 22 (ref 11–26)
BUN: 16 mg/dL (ref 8–27)
CO2: 26 mmol/L (ref 18–29)
Calcium: 9.4 mg/dL (ref 8.7–10.3)
Chloride: 102 mmol/L (ref 97–108)
Creatinine, Ser: 0.74 mg/dL (ref 0.57–1.00)
GFR calc Af Amer: 85 mL/min/{1.73_m2} (ref >59)
GFR calc non Af Amer: 74 mL/min/{1.73_m2} (ref >59)
Globulin, Total: 1.8 g/dL (ref 1.5–4.5)
Glucose: 95 mg/dL (ref 65–99)
Potassium: 3.7 mmol/L (ref 3.5–5.2)
Sodium: 145 mmol/L — ABNORMAL HIGH (ref 134–144)
Total Bilirubin: 0.4 mg/dL (ref 0.0–1.2)
Total Protein: 6.2 g/dL (ref 6.0–8.5)

## 2014-09-05 LAB — CBC WITH DIFFERENTIAL
Basophils Absolute: 0 10*3/uL (ref 0.0–0.2)
Basos: 1 %
Eos: 3 %
Eosinophils Absolute: 0.2 10*3/uL (ref 0.0–0.4)
HCT: 40.5 % (ref 34.0–46.6)
Hemoglobin: 13.6 g/dL (ref 11.1–15.9)
Immature Grans (Abs): 0 10*3/uL (ref 0.0–0.1)
Immature Granulocytes: 0 %
Lymphocytes Absolute: 2.8 10*3/uL (ref 0.7–3.1)
Lymphs: 39 %
MCH: 29.6 pg (ref 26.6–33.0)
MCHC: 33.6 g/dL (ref 31.5–35.7)
MCV: 88 fL (ref 79–97)
Monocytes Absolute: 0.6 10*3/uL (ref 0.1–0.9)
Monocytes: 8 %
Neutrophils Absolute: 3.7 10*3/uL (ref 1.4–7.0)
Neutrophils Relative %: 49 %
Platelets: 288 10*3/uL (ref 150–379)
RBC: 4.59 x10E6/uL (ref 3.77–5.28)
RDW: 14 % (ref 12.3–15.4)
WBC: 7.3 10*3/uL (ref 3.4–10.8)

## 2014-09-05 LAB — LIPID PANEL
Chol/HDL Ratio: 3.1 ratio units (ref 0.0–4.4)
Cholesterol, Total: 235 mg/dL — ABNORMAL HIGH (ref 100–199)
HDL: 77 mg/dL (ref >39)
LDL Calculated: 117 mg/dL — ABNORMAL HIGH (ref 0–99)
Triglycerides: 204 mg/dL — ABNORMAL HIGH (ref 0–149)
VLDL Cholesterol Cal: 41 mg/dL — ABNORMAL HIGH (ref 5–40)

## 2014-09-05 LAB — HEMOGLOBIN A1C
Est. average glucose Bld gHb Est-mCnc: 126 mg/dL
Hgb A1c MFr Bld: 6 % — ABNORMAL HIGH (ref 4.8–5.6)

## 2014-09-08 ENCOUNTER — Encounter: Payer: Medicare Other | Admitting: Internal Medicine

## 2014-11-07 ENCOUNTER — Telehealth: Payer: Self-pay | Admitting: *Deleted

## 2014-11-07 NOTE — Telephone Encounter (Signed)
Patient called and stated that insurance will no longer cover her Quinopril and needs it changed to either Lisinopril 20/12.5 or Benazepril 20/12.5. She stated that she called the insurance company and these are what they have that's comparable to what she is taking that they will cover. Please Advise.

## 2014-11-10 NOTE — Telephone Encounter (Signed)
Let's do lisinopril 20/hctz 12.5mg  daily.  This is probably the cheaper of the two.

## 2014-11-13 MED ORDER — LISINOPRIL-HYDROCHLOROTHIAZIDE 20-12.5 MG PO TABS: 1.0000 | ORAL_TABLET | Freq: Every day | ORAL | Status: DC

## 2014-11-13 NOTE — Telephone Encounter (Signed)
Called patient told her Dr. Mariea Clonts suggested Lisinopril/hctz 20-12.5. Patient wants it faxed to [facility].

## 2014-11-16 ENCOUNTER — Telehealth: Payer: Self-pay | Admitting: *Deleted

## 2014-11-16 NOTE — Telephone Encounter (Signed)
[facility] called and wanted to clarify what medication patient was taking. Quinapril or Lisinopril. Patient switched to Lisinopril 20/12.5 because insurance will not cover Quinapril. Spoke with the pharmacist. Reference #: [id number]

## 2014-11-17 ENCOUNTER — Encounter: Payer: Medicare Other | Admitting: Internal Medicine

## 2014-12-22 ENCOUNTER — Other Ambulatory Visit: Payer: Self-pay | Admitting: Internal Medicine

## 2015-01-17 ENCOUNTER — Other Ambulatory Visit: Payer: Self-pay | Admitting: *Deleted

## 2015-01-17 ENCOUNTER — Other Ambulatory Visit: Payer: Medicare Other

## 2015-01-17 DIAGNOSIS — I10 Essential (primary) hypertension: Secondary | ICD-10-CM

## 2015-01-17 DIAGNOSIS — M069 Rheumatoid arthritis, unspecified: Secondary | ICD-10-CM

## 2015-01-17 DIAGNOSIS — E781 Pure hyperglyceridemia: Secondary | ICD-10-CM

## 2015-01-18 LAB — LIPID PANEL
Chol/HDL Ratio: 2.5 ratio units (ref 0.0–4.4)
Cholesterol, Total: 222 mg/dL — ABNORMAL HIGH (ref 100–199)
HDL: 89 mg/dL (ref >39)
LDL Calculated: 78 mg/dL (ref 0–99)
Triglycerides: 276 mg/dL — ABNORMAL HIGH (ref 0–149)
VLDL Cholesterol Cal: 55 mg/dL — ABNORMAL HIGH (ref 5–40)

## 2015-01-18 LAB — COMPREHENSIVE METABOLIC PANEL
ALT: 15 IU/L (ref 0–32)
AST: 14 IU/L (ref 0–40)
Albumin/Globulin Ratio: 2 (ref 1.1–2.5)
Albumin: 4.1 g/dL (ref 3.5–4.7)
Alkaline Phosphatase: 49 IU/L (ref 39–117)
BUN/Creatinine Ratio: 23 (ref 11–26)
BUN: 16 mg/dL (ref 8–27)
Bilirubin Total: 0.2 mg/dL (ref 0.0–1.2)
CO2: 29 mmol/L (ref 18–29)
Calcium: 9.9 mg/dL (ref 8.7–10.3)
Chloride: 99 mmol/L (ref 97–108)
Creatinine, Ser: 0.69 mg/dL (ref 0.57–1.00)
GFR calc Af Amer: 92 mL/min/{1.73_m2} (ref >59)
GFR calc non Af Amer: 80 mL/min/{1.73_m2} (ref >59)
Globulin, Total: 2.1 g/dL (ref 1.5–4.5)
Glucose: 88 mg/dL (ref 65–99)
Potassium: 4.3 mmol/L (ref 3.5–5.2)
Sodium: 141 mmol/L (ref 134–144)
Total Protein: 6.2 g/dL (ref 6.0–8.5)

## 2015-01-18 LAB — CBC WITH DIFFERENTIAL/PLATELET
Basophils Absolute: 0 10*3/uL (ref 0.0–0.2)
Basos: 0 %
EOS (ABSOLUTE): 0.1 10*3/uL (ref 0.0–0.4)
Eos: 1 %
Hematocrit: 40.2 % (ref 34.0–46.6)
Hemoglobin: 13.2 g/dL (ref 11.1–15.9)
Immature Grans (Abs): 0 10*3/uL (ref 0.0–0.1)
Immature Granulocytes: 0 %
Lymphocytes Absolute: 2.5 10*3/uL (ref 0.7–3.1)
Lymphs: 28 %
MCH: 28.7 pg (ref 26.6–33.0)
MCHC: 32.8 g/dL (ref 31.5–35.7)
MCV: 87 fL (ref 79–97)
Monocytes Absolute: 0.7 10*3/uL (ref 0.1–0.9)
Monocytes: 7 %
Neutrophils Absolute: 5.7 10*3/uL (ref 1.4–7.0)
Neutrophils: 64 %
Platelets: 262 10*3/uL (ref 150–379)
RBC: 4.6 x10E6/uL (ref 3.77–5.28)
RDW: 14.9 % (ref 12.3–15.4)
WBC: 9 10*3/uL (ref 3.4–10.8)

## 2015-01-22 ENCOUNTER — Encounter: Payer: Self-pay | Admitting: Internal Medicine

## 2015-01-22 ENCOUNTER — Ambulatory Visit (INDEPENDENT_AMBULATORY_CARE_PROVIDER_SITE_OTHER): Payer: Medicare Other | Admitting: Internal Medicine

## 2015-01-22 VITALS — BP 120/70 | HR 66 | Temp 97.6°F | Resp 18 | Ht 60.0 in | Wt 141.6 lb

## 2015-01-22 DIAGNOSIS — E781 Pure hyperglyceridemia: Secondary | ICD-10-CM | POA: Diagnosis not present

## 2015-01-22 DIAGNOSIS — Z23 Encounter for immunization: Secondary | ICD-10-CM

## 2015-01-22 DIAGNOSIS — N3941 Urge incontinence: Secondary | ICD-10-CM | POA: Diagnosis not present

## 2015-01-22 DIAGNOSIS — M069 Rheumatoid arthritis, unspecified: Secondary | ICD-10-CM

## 2015-01-22 DIAGNOSIS — I10 Essential (primary) hypertension: Secondary | ICD-10-CM

## 2015-01-22 DIAGNOSIS — H409 Unspecified glaucoma: Secondary | ICD-10-CM

## 2015-01-22 DIAGNOSIS — R350 Frequency of micturition: Secondary | ICD-10-CM | POA: Diagnosis not present

## 2015-01-22 DIAGNOSIS — F32A Depression, unspecified: Secondary | ICD-10-CM

## 2015-01-22 DIAGNOSIS — F329 Major depressive disorder, single episode, unspecified: Secondary | ICD-10-CM | POA: Diagnosis not present

## 2015-01-22 DIAGNOSIS — Z0000 Encounter for general adult medical examination without abnormal findings: Secondary | ICD-10-CM

## 2015-01-22 MED ORDER — LISINOPRIL 20 MG PO TABS: 20.0000 mg | ORAL_TABLET | Freq: Every day | ORAL | Status: DC

## 2015-01-22 NOTE — Progress Notes (Addendum)
Patient ID: [name], female   DOB: January 20, 1927, 81 y.o.   MRN: [id number]   Location:  [facility]  Code Status: DNR (reviewed today) Goals of Care: Advanced Directive information Does patient have an advance directive?: Yes, Type of Advance Directive: [place];Living will, Does patient want to make changes to advanced directive?: No - Patient declined  Chief Complaint  Patient presents with  . Annual Exam  . Acute Visit    urinary problems   HPI: Patient is a 81 y.o. white female seen in the office today for med mgt of chronic diseases.    Cannot hold her urine like she used to.  Especially at night, has to hurry up and get there.  Has always gone a lot at night and daytime.  Does not have leakage if she coughs or sneezes.  Has never had difficulty with swelling.    Mentions her thin skin on her legs and the spots she gets--discussed due to prednisone.  Has had difficulty with sweating a lot.    No more falls.  Mood is good. Worries about what would happen to her children if she dies.   Every now and then, she has negative thoughts, but overall tries to stay positive.  Does not exercise much with her chronic OA pain.  Walks with walker when cool out.  Does go shopping some and walks doing that. Rheumatologist, Dr. Ouida Sills, prescribes her prednisone for her RA. Reminded about using her cane and walker to prevent falls.  Also has handicapped accessible home. No longer getting mammograms. Is 81 so cscope no longer indicated.  Review of Systems:  Review of Systems  Constitutional: Negative for fever, chills, weight loss and malaise/fatigue.  HENT: Negative for congestion and hearing loss.   Eyes: Negative for blurred vision.       Has glaucoma  Respiratory: Negative for shortness of breath.   Cardiovascular: Negative for chest pain and leg swelling.  Gastrointestinal: Positive for constipation. Negative for abdominal  pain, blood in stool and melena.  Genitourinary: Positive for urgency and frequency. Negative for dysuria and hematuria.  Musculoskeletal: Positive for joint pain. Negative for falls.  Skin:       Easy bruisability and friability of skin, especially on legs and forearms  Neurological: Negative for dizziness and loss of consciousness.  Endo/Heme/Allergies: Bruises/bleeds easily.  Psychiatric/Behavioral: Positive for depression. Negative for memory loss. The patient does not have insomnia.     Past Medical History  Diagnosis Date  . Arthritis     Osteoarthritis  . GERD (gastroesophageal reflux disease)   . Hyperlipidemia   . Depression   . Osteopenia     Past Surgical History  Procedure Laterality Date  . Appendectomy  1936  . Tonsillectomy  1941  . Finger surgery  1989  . Cervical fusion  2004  . Spine surgery  2009    Back Fusion  . Colon surgery  06/12/2010    Colonoscopy    Allergies  Allergen Reactions  . Penicillins Rash   Medications: Patient's Medications  New Prescriptions   LISINOPRIL (PRINIVIL,ZESTRIL) 20 MG TABLET    Take 1 tablet (20 mg total) by mouth daily.  Previous Medications   ALENDRONATE (FOSAMAX) 70 MG TABLET    Take one tablet by mouth weekly.  Stay upright for 1 hour after taking.   ASPIRIN 81 MG TABLET    Take 81 mg by mouth daily.   BRIMONIDINE (ALPHAGAN) 0.2 %  OPHTHALMIC SOLUTION    Place 1 drop into both eyes daily.   CHOLECALCIFEROL (VITAMIN D) 1000 UNITS TABLET    Take 1,000 Units by mouth 2 (two) times daily. Take one tablet twice daily for vitamin D supplement.   DORZOLAMIDE-TIMOLOL (COSOPT) 22.3-6.8 MG/ML OPHTHALMIC SOLUTION    Place 1 drop into both eyes daily.   FLUOXETINE (PROZAC) 20 MG TABLET    TAKE 1 BY MOUTH DAILY   GABAPENTIN (NEURONTIN) 100 MG CAPSULE    Take 100 mg by mouth daily. Take 1 capsule every 8 hours as needed for nerve pain.   LATANOPROST (XALATAN) 0.005 % OPHTHALMIC SOLUTION    Place 1 drop into both eyes at bedtime.      OXYCODONE-ACETAMINOPHEN (PERCOCET/ROXICET) 5-325 MG PER TABLET    Take 1 tablet by mouth 3 (three) times daily as needed for pain. Take one tablet three times daily as needed for pain.   PANTOPRAZOLE (PROTONIX) 40 MG TABLET    Take one tablet by mouth once daily for acid reflux   PREDNISONE (DELTASONE) 5 MG TABLET    Take 5 mg by mouth daily.    PROBIOTIC PRODUCT (ALIGN PO)    Take by mouth daily.   SIMVASTATIN (ZOCOR) 20 MG TABLET    TAKE 1 BY MOUTH DAILY FOR CHLOLESTEROL  Modified Medications   No medications on file  Discontinued Medications   BRIMONIDINE (ALPHAGAN) 0.15 % OPHTHALMIC SOLUTION    Place 1 drop into both eyes 2 (two) times daily.    LISINOPRIL-HYDROCHLOROTHIAZIDE (PRINZIDE,ZESTORETIC) 20-12.5 MG PER TABLET    Take 1 tablet by mouth daily. Take one tablet daily for blood pressure   QUINAPRIL-HYDROCHLOROTHIAZIDE (ACCURETIC) 20-12.5 MG PER TABLET    Take one tablet by mouth once daily for blood pressure   SIMVASTATIN (ZOCOR) 20 MG TABLET    Take 20 mg by mouth daily.    Physical Exam: Filed Vitals:   01/22/15 1351  BP: 120/70  Pulse: 66  Temp: 97.6 F (36.4 C)  TempSrc: Oral  Resp: 18  Height: 5' (1.524 m)  Weight: 141 lb 9.6 oz (64.229 kg)  SpO2: 95%   Physical Exam  Constitutional: She is oriented to person, place, and time. No distress.  Abdominal weight  HENT:  Head: Normocephalic and atraumatic.  Right Ear: External ear normal.  Left Ear: External ear normal.  Nose: Nose normal.  Mouth/Throat: Oropharynx is clear and moist.  Eyes: Conjunctivae and EOM are normal. Pupils are equal, round, and reactive to light.  Neck: Normal range of motion. Neck supple. No JVD present. No tracheal deviation present. No thyromegaly present.  Cardiovascular: Normal rate, regular rhythm, normal heart sounds and intact distal pulses.   Pulmonary/Chest: Effort normal and breath sounds normal. No respiratory distress.  Abdominal: Soft. Bowel sounds are normal. She exhibits no  distension and no mass. There is no tenderness.  Musculoskeletal: Normal range of motion.  Walks with cane  Neurological: She is alert and oriented to person, place, and time.  Skin: Skin is warm and dry.  Multiple ecchymoses and brown spots on shins and forearms, thin skin  Psychiatric: She has a normal mood and affect.    Labs reviewed: Basic Metabolic Panel:  Recent Labs  03/03/14 0753 09/04/14 0913 01/17/15 0921  NA 144 145* 141  K 3.9 3.7 4.3  CL 101 102 99  CO2 31 26 29   GLUCOSE 98 95 88  BUN 15 16 16   CREATININE 0.78 0.74 0.69  CALCIUM 10.3 9.4 9.9  Liver Function Tests:  Recent Labs  09/04/14 0913 01/17/15 0921  AST 20 14  ALT 14 15  ALKPHOS 54 49  BILITOT 0.4 0.2  PROT 6.2 6.2   No results for input(s): LIPASE, AMYLASE in the last 8760 hours. No results for input(s): AMMONIA in the last 8760 hours. CBC:  Recent Labs  03/03/14 0753 09/04/14 0913 01/17/15 0921  WBC 8.2 7.3 9.0  NEUTROABS 5.2 3.7 5.7  HGB 13.8 13.6  --   HCT 42.7 40.5 40.2  MCV 91.6 88  --   PLT 217 288  --    Lipid Panel:  Recent Labs  09/04/14 0913 01/17/15 0921  CHOL 235* 222*  HDL 77 89  LDLCALC 117* 78  TRIG 204* 276*  CHOLHDL 3.1 2.5   Lab Results  Component Value Date   HGBA1C 6.0* 09/04/2014    Assessment/Plan 1. Urinary frequency - will r/o UTI this one time before we consider medication for her incontinence (ideally myrbetriq) -will also d/c hctz part of lisinopril/hctz - POC Urinalysis Dipstick  2. Urge incontinence of urine - as above - POC Urinalysis Dipstick  3. Rheumatoid arthritis -cont prednisone through rheumatology  4. Essential hypertension, benign -bp is well controlled in 120s with current regimen, but  - CBC with Differential/Platelet; Future - Comprehensive metabolic panel; Future  5. Depression -cont prozac which she's done well with over the years  6. Glaucoma -cont cosopt drops per ophthalmology  7. Need for vaccination  with 13-polyvalent pneumococcal conjugate vaccine -prevnar given today  8. Hypertriglyceridemia - cont statin therapy and f/u labs before next visit - Hemoglobin A1c; Future - Lipid panel; Future  9. Routine general medical examination at a health care facility -see hpi and insurance form completed.  Labs/tests ordered:   Orders Placed This Encounter  Procedures  . Pneumococcal conjugate vaccine 13-valent IM  . CBC with Differential/Platelet    Standing Status: Future     Number of Occurrences:      Standing Expiration Date: 01/22/2016  . Comprehensive metabolic panel    Standing Status: Future     Number of Occurrences:      Standing Expiration Date: 01/22/2016    Order Specific Question:  Has the patient fasted?    Answer:  Yes  . Hemoglobin A1c    Standing Status: Future     Number of Occurrences:      Standing Expiration Date: 01/22/2016  . Lipid panel    Standing Status: Future     Number of Occurrences:      Standing Expiration Date: 01/22/2016    Order Specific Question:  Has the patient fasted?    Answer:  Yes  . DNR (Do Not Resuscitate)    Order Specific Question:  In the event of cardiac or respiratory ARREST    Answer:  Do not call a "code blue"    Order Specific Question:  In the event of cardiac or respiratory ARREST    Answer:  Do not perform Intubation, CPR, defibrillation or ACLS    Order Specific Question:  In the event of cardiac or respiratory ARREST    Answer:  Use medication by any route, position, wound care, and other measures to relive pain and suffering. May use oxygen, suction and manual treatment of airway obstruction as needed for comfort.  Marland Kitchen POC Urinalysis Dipstick  prevnar  Annual exam completed today as well as acute visit for 15 mins for her urinary incontinence  Next appt:  6 mos with labs  before  [name] L. Mycheal Veldhuizen, D.O. [facility] [address], [place] [address] Cell Phone (Mon-Fri  8am-5pm):  [phone] On Call:  [phone] & follow prompts after 5pm & weekends Office Phone:  [phone] Office Fax:  [phone]

## 2015-01-23 ENCOUNTER — Other Ambulatory Visit: Payer: Medicare Other

## 2015-01-25 LAB — POCT URINALYSIS DIPSTICK
Bilirubin, UA: NEGATIVE
Blood, UA: NEGATIVE
Glucose, UA: NEGATIVE
Ketones, UA: NEGATIVE
Leukocytes, UA: NEGATIVE
Nitrite, UA: NEGATIVE
Protein, UA: NEGATIVE
Spec Grav, UA: 1.01
Urobilinogen, UA: 0.2
pH, UA: 7

## 2015-03-09 ENCOUNTER — Other Ambulatory Visit: Payer: Self-pay | Admitting: Internal Medicine

## 2015-03-26 ENCOUNTER — Telehealth: Payer: Self-pay | Admitting: *Deleted

## 2015-03-26 NOTE — Telephone Encounter (Signed)
Patient called and stated that she needs on a RX Pad that she uses Align Probiotic. She stated that she lives in subsidy housing and she needs it for that to take off of her rent. She needs to give it to her apartment manager. Wants it mailed to her once completed.

## 2015-03-27 NOTE — Telephone Encounter (Signed)
Ok to type out a letter that states that she takes align probiotic and I will sign it and we can mail it as requested.

## 2015-03-29 NOTE — Telephone Encounter (Signed)
Letter completed, printed and given to Dr. Mariea Clonts to sign.

## 2015-03-29 NOTE — Telephone Encounter (Signed)
Mailed to patient

## 2015-04-13 ENCOUNTER — Other Ambulatory Visit: Payer: Self-pay | Admitting: Internal Medicine

## 2015-04-13 ENCOUNTER — Other Ambulatory Visit: Payer: Self-pay

## 2015-04-13 MED ORDER — PANTOPRAZOLE SODIUM 40 MG PO TBEC: DELAYED_RELEASE_TABLET | ORAL | Status: DC

## 2015-05-04 ENCOUNTER — Ambulatory Visit (INDEPENDENT_AMBULATORY_CARE_PROVIDER_SITE_OTHER): Payer: Medicare Other

## 2015-05-04 DIAGNOSIS — Z23 Encounter for immunization: Secondary | ICD-10-CM

## 2015-07-16 ENCOUNTER — Other Ambulatory Visit: Payer: Self-pay | Admitting: Internal Medicine

## 2015-07-24 ENCOUNTER — Other Ambulatory Visit: Payer: Medicare Other

## 2015-07-24 DIAGNOSIS — I10 Essential (primary) hypertension: Secondary | ICD-10-CM

## 2015-07-24 DIAGNOSIS — E781 Pure hyperglyceridemia: Secondary | ICD-10-CM

## 2015-07-25 LAB — LIPID PANEL
Chol/HDL Ratio: 2.7 ratio units (ref 0.0–4.4)
Cholesterol, Total: 208 mg/dL — ABNORMAL HIGH (ref 100–199)
HDL: 77 mg/dL (ref >39)
LDL Calculated: 84 mg/dL (ref 0–99)
Triglycerides: 235 mg/dL — ABNORMAL HIGH (ref 0–149)
VLDL Cholesterol Cal: 47 mg/dL — ABNORMAL HIGH (ref 5–40)

## 2015-07-25 LAB — CBC WITH DIFFERENTIAL/PLATELET
Basophils Absolute: 0 10*3/uL (ref 0.0–0.2)
Basos: 1 %
EOS (ABSOLUTE): 0.2 10*3/uL (ref 0.0–0.4)
Eos: 3 %
Hematocrit: 39.4 % (ref 34.0–46.6)
Hemoglobin: 12.6 g/dL (ref 11.1–15.9)
Immature Grans (Abs): 0 10*3/uL (ref 0.0–0.1)
Immature Granulocytes: 0 %
Lymphocytes Absolute: 2 10*3/uL (ref 0.7–3.1)
Lymphs: 38 %
MCH: 27.8 pg (ref 26.6–33.0)
MCHC: 32 g/dL (ref 31.5–35.7)
MCV: 87 fL (ref 79–97)
Monocytes Absolute: 0.6 10*3/uL (ref 0.1–0.9)
Monocytes: 11 %
Neutrophils Absolute: 2.5 10*3/uL (ref 1.4–7.0)
Neutrophils: 47 %
Platelets: 254 10*3/uL (ref 150–379)
RBC: 4.53 x10E6/uL (ref 3.77–5.28)
RDW: 15.3 % (ref 12.3–15.4)
WBC: 5.2 10*3/uL (ref 3.4–10.8)

## 2015-07-25 LAB — COMPREHENSIVE METABOLIC PANEL
ALT: 15 IU/L (ref 0–32)
AST: 19 IU/L (ref 0–40)
Albumin/Globulin Ratio: 2 (ref 1.1–2.5)
Albumin: 4 g/dL (ref 3.5–4.7)
Alkaline Phosphatase: 48 IU/L (ref 39–117)
BUN/Creatinine Ratio: 20 (ref 11–26)
BUN: 13 mg/dL (ref 8–27)
Bilirubin Total: 0.3 mg/dL (ref 0.0–1.2)
CO2: 29 mmol/L (ref 18–29)
Calcium: 9.8 mg/dL (ref 8.7–10.3)
Chloride: 101 mmol/L (ref 97–106)
Creatinine, Ser: 0.66 mg/dL (ref 0.57–1.00)
GFR calc Af Amer: 93 mL/min/{1.73_m2} (ref >59)
GFR calc non Af Amer: 80 mL/min/{1.73_m2} (ref >59)
Globulin, Total: 2 g/dL (ref 1.5–4.5)
Glucose: 88 mg/dL (ref 65–99)
Potassium: 3.9 mmol/L (ref 3.5–5.2)
Sodium: 144 mmol/L (ref 136–144)
Total Protein: 6 g/dL (ref 6.0–8.5)

## 2015-07-25 LAB — HEMOGLOBIN A1C
Est. average glucose Bld gHb Est-mCnc: 137 mg/dL
Hgb A1c MFr Bld: 6.4 % — ABNORMAL HIGH (ref 4.8–5.6)

## 2015-07-26 ENCOUNTER — Ambulatory Visit (INDEPENDENT_AMBULATORY_CARE_PROVIDER_SITE_OTHER): Payer: Medicare Other | Admitting: Internal Medicine

## 2015-07-26 ENCOUNTER — Encounter: Payer: Self-pay | Admitting: Internal Medicine

## 2015-07-26 VITALS — BP 110/76 | HR 75 | Temp 98.1°F | Resp 20 | Ht 60.0 in | Wt 150.0 lb

## 2015-07-26 DIAGNOSIS — I872 Venous insufficiency (chronic) (peripheral): Secondary | ICD-10-CM

## 2015-07-26 DIAGNOSIS — F329 Major depressive disorder, single episode, unspecified: Secondary | ICD-10-CM

## 2015-07-26 DIAGNOSIS — M069 Rheumatoid arthritis, unspecified: Secondary | ICD-10-CM | POA: Diagnosis not present

## 2015-07-26 DIAGNOSIS — H409 Unspecified glaucoma: Secondary | ICD-10-CM | POA: Diagnosis not present

## 2015-07-26 DIAGNOSIS — Z780 Asymptomatic menopausal state: Secondary | ICD-10-CM | POA: Diagnosis not present

## 2015-07-26 DIAGNOSIS — N3941 Urge incontinence: Secondary | ICD-10-CM

## 2015-07-26 DIAGNOSIS — R739 Hyperglycemia, unspecified: Secondary | ICD-10-CM | POA: Diagnosis not present

## 2015-07-26 DIAGNOSIS — E781 Pure hyperglyceridemia: Secondary | ICD-10-CM

## 2015-07-26 DIAGNOSIS — I10 Essential (primary) hypertension: Secondary | ICD-10-CM

## 2015-07-26 DIAGNOSIS — F32A Depression, unspecified: Secondary | ICD-10-CM

## 2015-07-26 NOTE — Progress Notes (Signed)
Patient ID: [name], female   DOB: November 09, 1926, 81 y.o.   MRN: [id number]   Location: [facility] Provider: Rexene Edison. Mariea Clonts, D.O., C.M.D.  Code Status: DNR Goals of Care: Advanced Directive information Does patient have an advance directive?: Yes  Chief Complaint  Patient presents with  . Medical Management of Chronic Issues    6 month follow-up for Hypertension, Depression, Labs printed    HPI: Patient is a 81 y.o. female seen in the office today for med mgt of chronic diseases.    Feet have been swelling as the day goes on.  Not bad today.  Discussed dependent edema from venous insufficiency.     RA:  Has her chronic pain.  Continues on prednisone and percocet.  HTN:  bp well controlled.  Continues on lisinopril.    Hypertriglyceridemia:  Continue to be elevated and sugar average crept up with it.  Hyperglycemia up a little.  Watch sweets a little.  Ate three pumpkin rolls from [employer].    Review of Systems:  Review of Systems  Constitutional: Negative for fever, chills and malaise/fatigue.  HENT: Negative for congestion.   Eyes: Negative for blurred vision.  Respiratory: Negative for cough and shortness of breath.   Cardiovascular: Positive for leg swelling. Negative for chest pain.  Gastrointestinal: Negative for abdominal pain, constipation, blood in stool and melena.  Genitourinary: Negative for dysuria.  Musculoskeletal: Positive for back pain and joint pain. Negative for falls.  Skin: Negative for rash.  Neurological: Negative for dizziness, loss of consciousness and weakness.  Psychiatric/Behavioral: Negative for depression and memory loss. The patient is nervous/anxious.     Past Medical History  Diagnosis Date  . Arthritis     Osteoarthritis  . GERD (gastroesophageal reflux disease)   . Hyperlipidemia   . Depression   . Osteopenia     Past Surgical History  Procedure Laterality Date  . Appendectomy  1936  . Tonsillectomy  1941  . Finger  surgery  1989  . Cervical fusion  2004  . Spine surgery  2009    Back Fusion  . Colon surgery  06/12/2010    Colonoscopy    Allergies  Allergen Reactions  . Penicillins Rash      Medication List       This list is accurate as of: 07/26/15  1:42 PM.  Always use your most recent med list.               alendronate 70 MG tablet  Commonly known as:  FOSAMAX  Take one tablet by mouth weekly.  Stay upright for 1 hour after taking.     ALIGN PO  Take by mouth daily.     aspirin 81 MG tablet  Take 81 mg by mouth daily.     brimonidine 0.2 % ophthalmic solution  Commonly known as:  ALPHAGAN  Place 1 drop into both eyes daily.     cholecalciferol 1000 UNITS tablet  Commonly known as:  VITAMIN D  Take 1,000 Units by mouth 2 (two) times daily. Take one tablet twice daily for vitamin D supplement.     dorzolamide-timolol 22.3-6.8 MG/ML ophthalmic solution  Commonly known as:  COSOPT  Place 1 drop into both eyes daily.     FLUoxetine 20 MG tablet  Commonly known as:  PROZAC  TAKE 1 BY MOUTH DAILY     gabapentin 100 MG capsule  Commonly known as:  NEURONTIN  Take 100 mg by mouth daily. Take 1  capsule every 8 hours as needed for nerve pain.     latanoprost 0.005 % ophthalmic solution  Commonly known as:  XALATAN  Place 1 drop into both eyes at bedtime.     lisinopril 20 MG tablet  Commonly known as:  PRINIVIL,ZESTRIL  Take 1 tablet (20 mg total) by mouth daily.     oxyCODONE-acetaminophen 5-325 MG tablet  Commonly known as:  PERCOCET/ROXICET  Take 1 tablet by mouth 3 (three) times daily as needed for pain. Take one tablet three times daily as needed for pain.     pantoprazole 40 MG tablet  Commonly known as:  PROTONIX  Take one tablet by mouth once daily for acid reflux     predniSONE 5 MG tablet  Commonly known as:  DELTASONE  Take 5 mg by mouth daily.     simvastatin 20 MG tablet  Commonly known as:  ZOCOR  TAKE 1 BY MOUTH DAILY FOR CHOLESTEROL         Health Maintenance  Topic Date Due  . PNA vac Low Risk Adult (2 of 2 - PPSV23) 01/22/2016  . INFLUENZA VACCINE  03/18/2016  . TETANUS/TDAP  03/03/2024  . DEXA SCAN  Completed  . ZOSTAVAX  Completed    Physical Exam: Filed Vitals:   07/26/15 1321  BP: 110/76  Pulse: 75  Temp: 98.1 F (36.7 C)  TempSrc: Oral  Resp: 20  Height: 5' (1.524 m)  Weight: 150 lb (68.04 kg)  SpO2: 97%   Body mass index is 29.3 kg/(m^2). Physical Exam  Constitutional: She is oriented to person, place, and time. She appears well-developed and well-nourished. No distress.  Cardiovascular: Normal rate, regular rhythm, normal heart sounds and intact distal pulses.   No edema  Pulmonary/Chest: Effort normal and breath sounds normal. No respiratory distress.  Abdominal: Soft. Bowel sounds are normal. She exhibits no distension. There is no tenderness.  Musculoskeletal: Normal range of motion. She exhibits tenderness. She exhibits no edema.  Deformities of joints  Neurological: She is alert and oriented to person, place, and time.  Skin: Skin is warm and dry. There is pallor.  Psychiatric: She has a normal mood and affect.    Labs reviewed: Basic Metabolic Panel:  Recent Labs  09/04/14 0913 01/17/15 0921 07/24/15 0948  NA 145* 141 144  K 3.7 4.3 3.9  CL 102 99 101  CO2 26 29 29   GLUCOSE 95 88 88  BUN 16 16 13   CREATININE 0.74 0.69 0.66  CALCIUM 9.4 9.9 9.8   Liver Function Tests:  Recent Labs  09/04/14 0913 01/17/15 0921 07/24/15 0948  AST 20 14 19   ALT 14 15 15   ALKPHOS 54 49 48  BILITOT 0.4 0.2 0.3  PROT 6.2 6.2 6.0  ALBUMIN 4.4 4.1 4.0   No results for input(s): LIPASE, AMYLASE in the last 8760 hours. No results for input(s): AMMONIA in the last 8760 hours. CBC:  Recent Labs  09/04/14 0913 01/17/15 0921 07/24/15 0948  WBC 7.3 9.0 5.2  NEUTROABS 3.7 5.7 2.5  HGB 13.6  --   --   HCT 40.5 40.2 39.4  MCV 88  --   --   PLT 288  --   --    Lipid Panel:  Recent  Labs  09/04/14 0913 01/17/15 0921 07/24/15 0948  CHOL 235* 222* 208*  HDL 77 89 77  LDLCALC 117* 78 84  TRIG 204* 276* 235*  CHOLHDL 3.1 2.5 2.7   Lab Results  Component Value Date  HGBA1C 6.4* 07/24/2015    Assessment/Plan 1. Chronic venous insufficiency -educated on this condition -advised to inform me if she has shortness of breath, chest pain, palpitations or wakes up short of breath at which point further eval would be needed -has some hyperpigmentation and typical evidence of this  -advised to avoid high sodium foods, elevate feet at rest -may consider some compression hose if remains a problem with those interventions  2. Urge incontinence of urine -ongoing, will consider further assessment if she desires at a future visit, but she seems to be coping with it well and uses the restroom regularly to avoid accidents -hctz previously stopped so that is part of the reason for her edema, but this did not really help her incontinence  3. Rheumatoid arthritis involving multiple sites, unspecified rheumatoid factor presence (HCC) - cont prednisone therapy through rheumatology - CBC with Differential/Platelet; Future -also uses percocet for severe breakthrough pain  4. Essential hypertension, benign -bp is well controlled with her lisinopril - Comprehensive metabolic panel; Future  5. Depression -continues on her prozac and doing well with it  6. Glaucoma -cont xalatan drops and alphagan drops as per ophtho -another reason she cannot take most of the bladder medications  7. Hypertriglyceridemia - counseled on not overdoing her sweets and starches (ie three pumpkin rolls) - Lipid panel; Future  8. Hyperglycemia -counseled on diet, exercise - Hemoglobin A1c; Future - Comprehensive metabolic panel; Future  9. Postmenopausal estrogen deficiency -agrees to f/u bone density -is off fosamax now and will probably need prolia or forteo given her longstanding RA on  prednisone  - DG Bone Density; Future  Labs/tests ordered:   Orders Placed This Encounter  Procedures  . DG Bone Density    ORDER IN PROFICIENT / EPIC ORDER PF: 08/19/2012 [facility]          Standing Status: Future     Number of Occurrences:      Standing Expiration Date: 09/24/2016    Order Specific Question:  Reason for Exam (SYMPTOM  OR DIAGNOSIS REQUIRED)    Answer:  osteopenia, postmenopausal estrogen deficiency, chronic predisone for RA    Order Specific Question:  Preferred imaging location?    Answer:  [facility]  . Lipid panel    Standing Status: Future     Number of Occurrences:      Standing Expiration Date: 07/25/2016    Order Specific Question:  Has the patient fasted?    Answer:  Yes  . Hemoglobin A1c    Standing Status: Future     Number of Occurrences:      Standing Expiration Date: 07/25/2016  . Comprehensive metabolic panel    Standing Status: Future     Number of Occurrences:      Standing Expiration Date: 07/25/2016    Order Specific Question:  Has the patient fasted?    Answer:  Yes  . CBC with Differential/Platelet    Standing Status: Future     Number of Occurrences:      Standing Expiration Date: 07/25/2016    Next appt:  6 mos annual wellness, labs before   [place]. Fendi Meinhardt, D.O. [facility] [address], [place] [address] Cell Phone (Mon-Fri 8am-5pm):  [phone] On Call:  [phone] & follow prompts after 5pm & weekends Office Phone:  [phone] Office Fax:  [phone]

## 2015-08-14 ENCOUNTER — Other Ambulatory Visit: Payer: Self-pay | Admitting: *Deleted

## 2015-08-14 MED ORDER — ALENDRONATE SODIUM 70 MG PO TABS: ORAL_TABLET | ORAL | Status: DC

## 2015-08-14 NOTE — Telephone Encounter (Signed)
[employer]

## 2015-09-19 ENCOUNTER — Ambulatory Visit
Admission: RE | Admit: 2015-09-19 | Discharge: 2015-09-19 | Disposition: A | Discharge status: Home / Self Care | Payer: Medicare Other | Source: Ambulatory Visit | Attending: Internal Medicine | Admitting: Internal Medicine

## 2015-09-19 DIAGNOSIS — M85852 Other specified disorders of bone density and structure, left thigh: Secondary | ICD-10-CM | POA: Diagnosis not present

## 2015-09-19 DIAGNOSIS — Z780 Asymptomatic menopausal state: Secondary | ICD-10-CM

## 2015-10-17 DIAGNOSIS — Z85828 Personal history of other malignant neoplasm of skin: Secondary | ICD-10-CM | POA: Diagnosis not present

## 2015-10-17 DIAGNOSIS — L570 Actinic keratosis: Secondary | ICD-10-CM | POA: Diagnosis not present

## 2015-10-17 DIAGNOSIS — L309 Dermatitis, unspecified: Secondary | ICD-10-CM | POA: Diagnosis not present

## 2015-10-18 DIAGNOSIS — M150 Primary generalized (osteo)arthritis: Secondary | ICD-10-CM | POA: Diagnosis not present

## 2015-10-18 DIAGNOSIS — M5416 Radiculopathy, lumbar region: Secondary | ICD-10-CM | POA: Diagnosis not present

## 2015-10-18 DIAGNOSIS — M797 Fibromyalgia: Secondary | ICD-10-CM | POA: Diagnosis not present

## 2015-10-18 DIAGNOSIS — M1990 Unspecified osteoarthritis, unspecified site: Secondary | ICD-10-CM | POA: Diagnosis not present

## 2015-10-30 ENCOUNTER — Ambulatory Visit (INDEPENDENT_AMBULATORY_CARE_PROVIDER_SITE_OTHER): Payer: Medicare Other

## 2015-10-30 ENCOUNTER — Ambulatory Visit: Payer: Self-pay

## 2015-10-30 ENCOUNTER — Encounter: Payer: Self-pay | Admitting: Podiatry

## 2015-10-30 ENCOUNTER — Ambulatory Visit (INDEPENDENT_AMBULATORY_CARE_PROVIDER_SITE_OTHER): Payer: Medicare Other | Admitting: Podiatry

## 2015-10-30 VITALS — BP 102/56 | HR 85 | Resp 16

## 2015-10-30 DIAGNOSIS — M2041 Other hammer toe(s) (acquired), right foot: Secondary | ICD-10-CM

## 2015-10-30 DIAGNOSIS — S99922A Unspecified injury of left foot, initial encounter: Secondary | ICD-10-CM | POA: Diagnosis not present

## 2015-10-30 DIAGNOSIS — M2011 Hallux valgus (acquired), right foot: Secondary | ICD-10-CM

## 2015-10-30 NOTE — Progress Notes (Signed)
   Subjective:    Patient ID: [name], female    DOB: [birth year], 82 y.o.   MRN: [id number]  HPI: She presents today as an 82 year old email she states that this is going on for quite some time but has become more painful recently. His heart to find appropriate shoe gear that will help alleviate the symptoms. Most of the shoes tend to exacerbate her problem she states.    Review of Systems  Musculoskeletal: Positive for arthralgias.  All other systems reviewed and are negative.      Objective:   Physical Exam: Vital signs are stable she is alert and oriented 3 in no apparent distress. I have reviewed her past medical history medications allergy surgery social history and review of systems. Pulses are strongly palpable bilateral. Neurologic sensorium is intact per Semmes-Weinstein monofilament. Deep tendon reflexes are intact bilateral and muscle strength +5 over 5 dorsiflexors plantar flexors and inverters everters all intrinsic musculature is intact. Orthopedic evaluation and strength all joints distal to the ankle for an of motion. Hallux abductovalgus deformity bilateral with overlapping second toe right. This is flexible and reducible. She has a contusion to the toe second digit left foot. No open lesions in the skin.          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  Plan: Discussed etiology pathology conservative versus surgical therapies. At this point we discussed the need for surgical intervention which she declined. We also discussed placing her in a device to pull her toe down when she ambulates. She would like to try that and I will follow-up with her on an as-needed basis.

## 2015-11-07 DIAGNOSIS — M961 Postlaminectomy syndrome, not elsewhere classified: Secondary | ICD-10-CM | POA: Diagnosis not present

## 2015-11-07 DIAGNOSIS — M47816 Spondylosis without myelopathy or radiculopathy, lumbar region: Secondary | ICD-10-CM | POA: Diagnosis not present

## 2015-11-07 DIAGNOSIS — M5416 Radiculopathy, lumbar region: Secondary | ICD-10-CM | POA: Diagnosis not present

## 2015-11-07 DIAGNOSIS — M461 Sacroiliitis, not elsewhere classified: Secondary | ICD-10-CM | POA: Diagnosis not present

## 2015-11-12 DIAGNOSIS — M461 Sacroiliitis, not elsewhere classified: Secondary | ICD-10-CM | POA: Diagnosis not present

## 2015-11-21 DIAGNOSIS — M797 Fibromyalgia: Secondary | ICD-10-CM | POA: Diagnosis not present

## 2015-11-21 DIAGNOSIS — M5416 Radiculopathy, lumbar region: Secondary | ICD-10-CM | POA: Diagnosis not present

## 2015-11-21 DIAGNOSIS — M150 Primary generalized (osteo)arthritis: Secondary | ICD-10-CM | POA: Diagnosis not present

## 2015-11-21 DIAGNOSIS — L409 Psoriasis, unspecified: Secondary | ICD-10-CM | POA: Diagnosis not present

## 2015-11-21 DIAGNOSIS — M1990 Unspecified osteoarthritis, unspecified site: Secondary | ICD-10-CM | POA: Diagnosis not present

## 2015-12-05 DIAGNOSIS — H401132 Primary open-angle glaucoma, bilateral, moderate stage: Secondary | ICD-10-CM | POA: Diagnosis not present

## 2015-12-05 DIAGNOSIS — H43813 Vitreous degeneration, bilateral: Secondary | ICD-10-CM | POA: Diagnosis not present

## 2015-12-05 DIAGNOSIS — Z961 Presence of intraocular lens: Secondary | ICD-10-CM | POA: Diagnosis not present

## 2015-12-12 DIAGNOSIS — M1990 Unspecified osteoarthritis, unspecified site: Secondary | ICD-10-CM | POA: Diagnosis not present

## 2015-12-12 DIAGNOSIS — M5416 Radiculopathy, lumbar region: Secondary | ICD-10-CM | POA: Diagnosis not present

## 2015-12-12 DIAGNOSIS — M797 Fibromyalgia: Secondary | ICD-10-CM | POA: Diagnosis not present

## 2015-12-12 DIAGNOSIS — M150 Primary generalized (osteo)arthritis: Secondary | ICD-10-CM | POA: Diagnosis not present

## 2015-12-12 DIAGNOSIS — L409 Psoriasis, unspecified: Secondary | ICD-10-CM | POA: Diagnosis not present

## 2016-01-18 ENCOUNTER — Encounter: Payer: Self-pay | Admitting: Internal Medicine

## 2016-01-18 ENCOUNTER — Ambulatory Visit (INDEPENDENT_AMBULATORY_CARE_PROVIDER_SITE_OTHER): Payer: Medicare Other | Admitting: Internal Medicine

## 2016-01-18 VITALS — BP 110/60 | HR 71 | Temp 98.2°F | Wt 145.0 lb

## 2016-01-18 DIAGNOSIS — H60391 Other infective otitis externa, right ear: Secondary | ICD-10-CM | POA: Diagnosis not present

## 2016-01-18 DIAGNOSIS — W010XXA Fall on same level from slipping, tripping and stumbling without subsequent striking against object, initial encounter: Secondary | ICD-10-CM | POA: Diagnosis not present

## 2016-01-18 DIAGNOSIS — H8112 Benign paroxysmal vertigo, left ear: Secondary | ICD-10-CM

## 2016-01-18 DIAGNOSIS — S51012A Laceration without foreign body of left elbow, initial encounter: Secondary | ICD-10-CM | POA: Diagnosis not present

## 2016-01-18 DIAGNOSIS — H6121 Impacted cerumen, right ear: Secondary | ICD-10-CM

## 2016-01-18 DIAGNOSIS — H918X1 Other specified hearing loss, right ear: Secondary | ICD-10-CM | POA: Diagnosis not present

## 2016-01-18 MED ORDER — OFLOXACIN 0.3 % OT SOLN: 5.0000 [drp] | Freq: Every day | OTIC | Status: DC

## 2016-01-18 NOTE — Progress Notes (Signed)
Location:  [facility] [facility] Provider: Raj Landress L. Mariea Clonts, D.O., C.M.D.  Code Status: DNR Goals of Care:  Advanced Directives 01/24/2016  Does patient have an advance directive? Yes  Type of Advance Directive Healthcare Power of Attorney  Copy of advanced directive(s) in chart? Yes   Chief Complaint  Patient presents with  . Acute Visit    dizzy when laying down, fell on tuesday, elbow pain    HPI: Patient is a 82 y.o. female with rheumatoid arthritis, senile osteoporosis, hyperlipidemia, htn, glaucoma, neuropathy and glaucoma seen today for an acute visit for dizziness when laying down, fell on Tuesday and hurt her elbow.  Someone came to her door after the cleaning lady had cleaned her kitchen floor and she fell.  Was feeling ok, until she started having dizziness turning her head in bed.  Also was looking at the bottom shelf of the fridge and felt dizzy.  Her neighbor brought her b/c she didn't want to have an accident.  Has nystagmus to the left.  Large lac to left elbow.  Past Medical History  Diagnosis Date  . Arthritis     Osteoarthritis  . GERD (gastroesophageal reflux disease)   . Hyperlipidemia   . Depression   . Osteopenia     Past Surgical History  Procedure Laterality Date  . Appendectomy  1936  . Tonsillectomy  1941  . Finger surgery  1989  . Cervical fusion  2004  . Spine surgery  2009    Back Fusion  . Colon surgery  06/12/2010    Colonoscopy    Allergies  Allergen Reactions  . Penicillins Rash      Medication List       This list is accurate as of: 01/18/16 11:59 PM.  Always use your most recent med list.               alendronate 70 MG tablet  Commonly known as:  FOSAMAX  Take one tablet by mouth weekly.  Stay upright for 1 hour after taking.     ALIGN PO  Take by mouth daily.     aspirin 81 MG tablet  Take 81 mg by mouth daily.     brimonidine 0.15 % ophthalmic solution  Commonly known as:  ALPHAGAN  INT 1 GTT INTO OU BID     cholecalciferol 1000 units tablet  Commonly known as:  VITAMIN D  Take 1,000 Units by mouth 2 (two) times daily.     dorzolamide-timolol 22.3-6.8 MG/ML ophthalmic solution  Commonly known as:  COSOPT  Place 1 drop into both eyes daily.     FLUoxetine 20 MG tablet  Commonly known as:  PROZAC  TAKE 1 BY MOUTH DAILY     gabapentin 300 MG capsule  Commonly known as:  NEURONTIN     latanoprost 0.005 % ophthalmic solution  Commonly known as:  XALATAN  Place 1 drop into both eyes at bedtime.     lisinopril-hydrochlorothiazide 20-12.5 MG tablet  Commonly known as:  PRINZIDE,ZESTORETIC     ofloxacin 0.3 % otic solution  Commonly known as:  FLOXIN  Place 5 drops into the right ear daily. Until drops used up     oxyCODONE-acetaminophen 5-325 MG tablet  Commonly known as:  PERCOCET/ROXICET  Take 1 tablet by mouth 3 (three) times daily as needed for pain.     pantoprazole 40 MG tablet  Commonly known as:  PROTONIX  Take one tablet by mouth once daily for acid reflux  predniSONE 1 MG tablet  Commonly known as:  DELTASONE  3 tablets Once a day Orally 90 days     simvastatin 20 MG tablet  Commonly known as:  ZOCOR  TAKE 1 BY MOUTH DAILY FOR CHOLESTEROL     triamcinolone cream 0.1 %  Commonly known as:  KENALOG  1 application apply on the skin twice a day        Review of Systems:  Review of Systems  Constitutional: Negative for fever and chills.  HENT: Positive for ear pain and tinnitus. Negative for ear discharge.   Eyes: Negative for blurred vision.  Respiratory: Negative for shortness of breath.   Cardiovascular: Negative for chest pain.  Gastrointestinal: Positive for constipation. Negative for abdominal pain.  Genitourinary: Negative for dysuria.  Musculoskeletal: Positive for myalgias, back pain, joint pain and falls.  Skin: Negative for rash.  Neurological: Positive for dizziness. Negative for loss of consciousness.  Psychiatric/Behavioral: Negative for memory  loss.    Health Maintenance  Topic Date Due  . PNA vac Low Risk Adult (2 of 2 - PPSV23) 01/22/2016  . INFLUENZA VACCINE  03/18/2016  . TETANUS/TDAP  03/03/2024  . DEXA SCAN  Completed  . ZOSTAVAX  Completed    Physical Exam: Filed Vitals:   01/18/16 1032  BP: 110/60  Pulse: 71  Temp: 98.2 F (36.8 C)  TempSrc: Oral  Weight: 145 lb (65.772 kg)  SpO2: 96%   Body mass index is 28.32 kg/(m^2). Physical Exam  Constitutional: She is oriented to person, place, and time. She appears well-developed and well-nourished. No distress.  HENT:  Head: Normocephalic and atraumatic.  Right Ear: External ear normal.  Left Ear: External ear normal.  Nose: Nose normal.  Mouth/Throat: Oropharynx is clear and moist.  Right ear with what first appeared to be cerumen impaction, but after CMA flushed the ear, some black and white fungal appearing material was present  Eyes: EOM are normal. Pupils are equal, round, and reactive to light.  Right nystagmus present and dizziness worse with turning her head  Cardiovascular: Normal rate, regular rhythm, normal heart sounds and intact distal pulses.   Pulmonary/Chest: Effort normal and breath sounds normal.  Abdominal: Soft. Bowel sounds are normal.  Musculoskeletal:  Uses cane to ambulate  Neurological: She is alert and oriented to person, place, and time.  Skin: Skin is warm and dry.  Left elbow with 1.5 in skin tear across it that is not fully reapproximated, some blood drainage on her bandaid--was cleansed and redressed for her  Psychiatric: She has a normal mood and affect.    Labs reviewed: Basic Metabolic Panel:  Recent Labs  07/24/15 0948 01/21/16 1011  NA 144 143  K 3.9 4.1  CL 101 100  CO2 29 27  GLUCOSE 88 97  BUN 13 16  CREATININE 0.66 0.70  CALCIUM 9.8 9.4   Liver Function Tests:  Recent Labs  07/24/15 0948 01/21/16 1011  AST 19 13  ALT 15 14  ALKPHOS 48 51  BILITOT 0.3 0.2  PROT 6.0 5.8*  ALBUMIN 4.0 4.0   No  results for input(s): LIPASE, AMYLASE in the last 8760 hours. No results for input(s): AMMONIA in the last 8760 hours. CBC:  Recent Labs  07/24/15 0948 01/21/16 1011  WBC 5.2 6.5  NEUTROABS 2.5 3.6  HCT 39.4 37.8  MCV 87 85  PLT 254 269   Lipid Panel:  Recent Labs  07/24/15 0948 01/21/16 1011  CHOL 208* 203*  HDL 77 69  LDLCALC 84 87  TRIG 235* 234*  CHOLHDL 2.7 2.9   Lab Results  Component Value Date   HGBA1C 6.4* 01/21/2016   Assessment/Plan 1. BPPV (benign paroxysmal positional vertigo), left -suggested PT, but she refuses -will continue to use her cane and treat the external ear infection to see if it will resolved -she says if it persists she is agreeable to some PT, but has not had good results with therapy in the past  2. Fall from slipping on wet surface, initial encounter -slipped on wet floor after cleaning lady thought it was dry sustaining a laceration to her left elbow and seems this induced some vertigo--she has no focal deficits and no swelling on her scalp but says she did strike her head  3. Elbow laceration, left, initial encounter -cleansed with saline and secure dressing applied  4. Hearing loss of right ear due to cerumen impaction -s/p flushing, but revealed infectious material so will treat appropriately  5. Otitis, externa, infective, right - ofloxacin (FLOXIN) 0.3 % otic solution; Place 5 drops into the right ear daily. Until drops used up  Dispense: 5 mL; Refill: 0  Labs/tests ordered:  No orders of the defined types were placed in this encounter.   Next appt:  01/21/2016 annual as scheduled  Chandani Rogowski L. Lareta Bruneau, D.O. [facility] [address], [place] [address] Cell Phone (Mon-Fri 8am-5pm):  [phone] On Call:  [phone] & follow prompts after 5pm & weekends Office Phone:  [phone] Office Fax:  [phone]

## 2016-01-21 ENCOUNTER — Other Ambulatory Visit: Payer: Medicare Other

## 2016-01-21 DIAGNOSIS — M069 Rheumatoid arthritis, unspecified: Secondary | ICD-10-CM

## 2016-01-21 DIAGNOSIS — R739 Hyperglycemia, unspecified: Secondary | ICD-10-CM | POA: Diagnosis not present

## 2016-01-21 DIAGNOSIS — I10 Essential (primary) hypertension: Secondary | ICD-10-CM | POA: Diagnosis not present

## 2016-01-21 DIAGNOSIS — E781 Pure hyperglyceridemia: Secondary | ICD-10-CM | POA: Diagnosis not present

## 2016-01-22 LAB — HEMOGLOBIN A1C
Est. average glucose Bld gHb Est-mCnc: 137 mg/dL
Hgb A1c MFr Bld: 6.4 % — ABNORMAL HIGH (ref 4.8–5.6)

## 2016-01-22 LAB — CBC WITH DIFFERENTIAL/PLATELET
Basophils Absolute: 0 10*3/uL (ref 0.0–0.2)
Basos: 1 %
EOS (ABSOLUTE): 0.3 10*3/uL (ref 0.0–0.4)
Eos: 4 %
Hematocrit: 37.8 % (ref 34.0–46.6)
Hemoglobin: 12.1 g/dL (ref 11.1–15.9)
Immature Grans (Abs): 0 10*3/uL (ref 0.0–0.1)
Immature Granulocytes: 0 %
Lymphocytes Absolute: 2 10*3/uL (ref 0.7–3.1)
Lymphs: 31 %
MCH: 27.3 pg (ref 26.6–33.0)
MCHC: 32 g/dL (ref 31.5–35.7)
MCV: 85 fL (ref 79–97)
Monocytes Absolute: 0.6 10*3/uL (ref 0.1–0.9)
Monocytes: 9 %
Neutrophils Absolute: 3.6 10*3/uL (ref 1.4–7.0)
Neutrophils: 55 %
Platelets: 269 10*3/uL (ref 150–379)
RBC: 4.43 x10E6/uL (ref 3.77–5.28)
RDW: 15.5 % — ABNORMAL HIGH (ref 12.3–15.4)
WBC: 6.5 10*3/uL (ref 3.4–10.8)

## 2016-01-22 LAB — COMPREHENSIVE METABOLIC PANEL
ALT: 14 IU/L (ref 0–32)
AST: 13 IU/L (ref 0–40)
Albumin/Globulin Ratio: 2.2 (ref 1.2–2.2)
Albumin: 4 g/dL (ref 3.5–4.7)
Alkaline Phosphatase: 51 IU/L (ref 39–117)
BUN/Creatinine Ratio: 23 (ref 12–28)
BUN: 16 mg/dL (ref 8–27)
Bilirubin Total: 0.2 mg/dL (ref 0.0–1.2)
CO2: 27 mmol/L (ref 18–29)
Calcium: 9.4 mg/dL (ref 8.7–10.3)
Chloride: 100 mmol/L (ref 96–106)
Creatinine, Ser: 0.7 mg/dL (ref 0.57–1.00)
GFR calc Af Amer: 91 mL/min/{1.73_m2} (ref >59)
GFR calc non Af Amer: 79 mL/min/{1.73_m2} (ref >59)
Globulin, Total: 1.8 g/dL (ref 1.5–4.5)
Glucose: 97 mg/dL (ref 65–99)
Potassium: 4.1 mmol/L (ref 3.5–5.2)
Sodium: 143 mmol/L (ref 134–144)
Total Protein: 5.8 g/dL — ABNORMAL LOW (ref 6.0–8.5)

## 2016-01-22 LAB — LIPID PANEL
Chol/HDL Ratio: 2.9 ratio units (ref 0.0–4.4)
Cholesterol, Total: 203 mg/dL — ABNORMAL HIGH (ref 100–199)
HDL: 69 mg/dL (ref >39)
LDL Calculated: 87 mg/dL (ref 0–99)
Triglycerides: 234 mg/dL — ABNORMAL HIGH (ref 0–149)
VLDL Cholesterol Cal: 47 mg/dL — ABNORMAL HIGH (ref 5–40)

## 2016-01-24 ENCOUNTER — Ambulatory Visit (INDEPENDENT_AMBULATORY_CARE_PROVIDER_SITE_OTHER): Payer: Medicare Other | Admitting: Internal Medicine

## 2016-01-24 ENCOUNTER — Encounter: Payer: Self-pay | Admitting: Internal Medicine

## 2016-01-24 ENCOUNTER — Encounter: Payer: Self-pay | Admitting: *Deleted

## 2016-01-24 VITALS — BP 108/60 | HR 70 | Temp 98.3°F | Ht 60.0 in | Wt 146.0 lb

## 2016-01-24 DIAGNOSIS — Z0000 Encounter for general adult medical examination without abnormal findings: Secondary | ICD-10-CM | POA: Diagnosis not present

## 2016-01-24 DIAGNOSIS — I10 Essential (primary) hypertension: Secondary | ICD-10-CM | POA: Diagnosis not present

## 2016-01-24 DIAGNOSIS — H8113 Benign paroxysmal vertigo, bilateral: Secondary | ICD-10-CM

## 2016-01-24 NOTE — Progress Notes (Signed)
Location:  [facility] clinic Provider: Genaro Bekker L. Mariea Clonts, D.O., C.M.D.  Patient Care Team: Gayland Curry, DO as PCP - General (Geriatric Medicine) Warden Fillers, MD as Consulting Physician (Ophthalmology)  Extended Emergency Contact Information Primary Emergency Contact: Baumann,Erin Address: [address]          [place] [address] [place] Phone: [phone] Relation: Daughter  Code Status: DNR Goals of Care: Advanced Directive information Advanced Directives 01/24/2016  Does patient have an advance directive? Yes  Type of Advance Directive Healthcare Power of Attorney  Copy of advanced directive(s) in chart? Yes  She has a HCPOA on File and is a DNR.    Chief Complaint  Patient presents with  . Annual Exam    wellness exam  . MMSE    30/30 failed clock    HPI: Patient is a 82 y.o. female seen in today for an annual wellness exam.    Depression screen [facility] 2/9 01/18/2016 01/22/2015 08/08/2013  Decreased Interest 0 0 0  Down, Depressed, Hopeless 0 0 0  PHQ - 2 Score 0 0 0    Fall Risk  01/18/2016 07/26/2015 01/22/2015 03/06/2014 08/08/2013  Falls in the past year? Yes No No Yes No  Number falls in past yr: 1 - - 1 -  Injury with Fall? Yes - - Yes -   MMSE - Mini Mental State Exam 01/24/2016  Orientation to time 5  Orientation to Place 5  Registration 3  Attention/ Calculation 5  Recall 3  Language- name 2 objects 2  Language- repeat 1  Language- follow 3 step command 3  Language- read & follow direction 1  Write a sentence 1  Copy design 1  Total score 30     Health Maintenance  Topic Date Due  . PNA vac Low Risk Adult (2 of 2 - PPSV23) 01/22/2016  . INFLUENZA VACCINE  03/18/2016  . TETANUS/TDAP  03/03/2024  . DEXA SCAN  Completed  . ZOSTAVAX  Completed   Urinary incontinence? Does sometimes have difficulty--it's at night when she has to wake up to go and has to hurry or she will leak.  Not pleasant especially if she's going to leak when she  visits her son.  Says she's always ok in the daytime and has always had overactive bladder to some degree Functional Status Survey: Is the patient deaf or have difficulty hearing?: Yes Does the patient have difficulty seeing, even when wearing glasses/contacts?: Yes Does the patient have difficulty concentrating, remembering, or making decisions?: No Does the patient have difficulty walking or climbing stairs?: No Does the patient have difficulty dressing or bathing?: No Does the patient have difficulty doing errands alone such as visiting a doctor's office or shopping?: No Exercise?  Not really doing any.  Current Exercise Habits: The patient does not participate in regular exercise at present Exercise limited by: orthopedic condition(s) Diet?  No special diet Vision Screening Comments: Dr. Katy Fitch, has glaucoma  Hearing:  fine Dentition:  fine Pain:  Can be a 10/10 in her back when she first gets up.  After her coffee and her pill, she's good.  Has been cutting back on her oxycodone--used to take 3 per day, but now 1 maybe 2 on a bad day.  Causes constipation if she takes more.  Uses miralax if needed, has yogurt for breakfast and takes align.  Past Medical History  Diagnosis Date  . Arthritis     Osteoarthritis  . GERD (gastroesophageal reflux disease)   .  Hyperlipidemia   . Depression   . Osteopenia     Past Surgical History  Procedure Laterality Date  . Appendectomy  1936  . Tonsillectomy  1941  . Finger surgery  1989  . Cervical fusion  2004  . Spine surgery  2009    Back Fusion  . Colon surgery  06/12/2010    Colonoscopy    Social History   Social History  . Marital Status: Divorced    Spouse Name: N/A  . Number of Children: N/A  . Years of Education: N/A   Occupational History  . Not on file.   Social History Main Topics  . Smoking status: Former Research scientist (life sciences)  . Smokeless tobacco: Never Used  . Alcohol Use: No  . Drug Use: No  . Sexual Activity: Not on file    Other Topics Concern  . Not on file   Social History Narrative    Allergies  Allergen Reactions  . Penicillins Rash      Medication List       This list is accurate as of: 01/24/16  4:33 PM.  Always use your most recent med list.               alendronate 70 MG tablet  Commonly known as:  FOSAMAX  Take one tablet by mouth weekly.  Stay upright for 1 hour after taking.     ALIGN PO  Take by mouth daily.     aspirin 81 MG tablet  Take 81 mg by mouth daily.     brimonidine 0.15 % ophthalmic solution  Commonly known as:  ALPHAGAN  INT 1 GTT INTO OU BID     cholecalciferol 1000 units tablet  Commonly known as:  VITAMIN D  Take 1,000 Units by mouth 2 (two) times daily.     dorzolamide-timolol 22.3-6.8 MG/ML ophthalmic solution  Commonly known as:  COSOPT  Place 1 drop into both eyes daily.     FLUoxetine 20 MG tablet  Commonly known as:  PROZAC  TAKE 1 BY MOUTH DAILY     gabapentin 300 MG capsule  Commonly known as:  NEURONTIN     latanoprost 0.005 % ophthalmic solution  Commonly known as:  XALATAN  Place 1 drop into both eyes at bedtime.     lisinopril-hydrochlorothiazide 20-12.5 MG tablet  Commonly known as:  PRINZIDE,ZESTORETIC     ofloxacin 0.3 % otic solution  Commonly known as:  FLOXIN  Place 5 drops into the right ear daily. Until drops used up     oxyCODONE-acetaminophen 5-325 MG tablet  Commonly known as:  PERCOCET/ROXICET  Take 1 tablet by mouth 3 (three) times daily as needed for pain.     pantoprazole 40 MG tablet  Commonly known as:  PROTONIX  Take one tablet by mouth once daily for acid reflux     predniSONE 1 MG tablet  Commonly known as:  DELTASONE  3 tablets Once a day Orally 90 days     simvastatin 20 MG tablet  Commonly known as:  ZOCOR  TAKE 1 BY MOUTH DAILY FOR CHOLESTEROL     triamcinolone cream 0.1 %  Commonly known as:  KENALOG  1 application apply on the skin twice a day       Review of Systems:  Review of  Systems  Constitutional: Negative for fever, chills and malaise/fatigue.  HENT: Negative for hearing loss.        Vertigo; right ear infection getting better  Eyes: Negative for blurred  vision.  Respiratory: Negative for shortness of breath.   Cardiovascular: Negative for chest pain, palpitations and leg swelling.  Gastrointestinal: Positive for constipation. Negative for abdominal pain, blood in stool and melena.  Genitourinary: Positive for urgency and frequency. Negative for dysuria.  Musculoskeletal: Positive for falls.       None since last appt, but did have recent fall with left elbow laceration  Skin: Negative for itching and rash.       Left elbow laceration looking better, no longer open  Neurological: Positive for dizziness. Negative for loss of consciousness and weakness.  Psychiatric/Behavioral: Negative for depression and memory loss. The patient is not nervous/anxious and does not have insomnia.     Physical Exam: Filed Vitals:   01/24/16 1355  BP: 108/60  Pulse: 70  Temp: 98.3 F (36.8 C)  TempSrc: Oral  Height: 5' (1.524 m)  Weight: 146 lb (66.225 kg)  SpO2: 96%   Body mass index is 28.51 kg/(m^2). Physical Exam  Constitutional: She is oriented to person, place, and time. She appears well-developed and well-nourished. No distress.  HENT:  Head: Normocephalic and atraumatic.  Right Ear: External ear normal.  Left Ear: External ear normal.  Nose: Nose normal.  Mouth/Throat: Oropharynx is clear and moist. No oropharyngeal exudate.  Bilateral TM now pink with normal light reflex, mild residual material in right ear  Eyes: Conjunctivae and EOM are normal. Pupils are equal, round, and reactive to light.  Neck: Normal range of motion. Neck supple. No JVD present.  Cardiovascular: Normal rate, regular rhythm, normal heart sounds and intact distal pulses.   Pulmonary/Chest: Effort normal and breath sounds normal. No respiratory distress.  Abdominal: Soft. Bowel  sounds are normal.  Musculoskeletal: Normal range of motion.  Unsteady gait, keeps leaving cane behind--advised to use at all times  Lymphadenopathy:    She has no cervical adenopathy.  Neurological: She is alert and oriented to person, place, and time. She has normal reflexes. No cranial nerve deficit.  Skin: Skin is warm and dry.  Psychiatric: She has a normal mood and affect. Her behavior is normal. Judgment and thought content normal.    Labs reviewed: Basic Metabolic Panel:  Recent Labs  07/24/15 0948 01/21/16 1011  NA 144 143  K 3.9 4.1  CL 101 100  CO2 29 27  GLUCOSE 88 97  BUN 13 16  CREATININE 0.66 0.70  CALCIUM 9.8 9.4   Liver Function Tests:  Recent Labs  07/24/15 0948 01/21/16 1011  AST 19 13  ALT 15 14  ALKPHOS 48 51  BILITOT 0.3 0.2  PROT 6.0 5.8*  ALBUMIN 4.0 4.0   No results for input(s): LIPASE, AMYLASE in the last 8760 hours. No results for input(s): AMMONIA in the last 8760 hours. CBC:  Recent Labs  07/24/15 0948 01/21/16 1011  WBC 5.2 6.5  NEUTROABS 2.5 3.6  HCT 39.4 37.8  MCV 87 85  PLT 254 269   Lipid Panel:  Recent Labs  07/24/15 0948 01/21/16 1011  CHOL 208* 203*  HDL 77 69  LDLCALC 84 87  TRIG 235* 234*  CHOLHDL 2.7 2.9   Lab Results  Component Value Date   HGBA1C 6.4* 01/21/2016    Procedures: EKG today with NSR at 7, old MI, stable from 02/21/14   Assessment/Plan 1. Essential hypertension, benign -bp is at goal with current therapy, ekg done today - EKG 12-Lead  2. Medicare annual wellness visit, subsequent -up to date on preventive care -see hpi  Labs/tests  ordered:   Orders Placed This Encounter  Procedures  . CBC with Differential/Platelet    Standing Status: Future     Number of Occurrences:      Standing Expiration Date: 01/23/2017  . Comprehensive metabolic panel    Standing Status: Future     Number of Occurrences:      Standing Expiration Date: 01/23/2017    Order Specific Question:  Has the  patient fasted?    Answer:  Yes  . Hemoglobin A1c    Standing Status: Future     Number of Occurrences:      Standing Expiration Date: 01/23/2017  . Lipid panel    Standing Status: Future     Number of Occurrences:      Standing Expiration Date: 01/23/2017    Order Specific Question:  Has the patient fasted?    Answer:  Yes  . TSH    Standing Status: Future     Number of Occurrences:      Standing Expiration Date: 01/23/2017  . EKG 12-Lead    Next appt:  6 mos med mgt  Yashika Mask L. Quinette Hentges, D.O. [facility] [address], [place] [address] Cell Phone (Mon-Fri 8am-5pm):  [phone] On Call:  [phone] & follow prompts after 5pm & weekends Office Phone:  [phone] Office Fax:  [phone]

## 2016-01-24 NOTE — Patient Instructions (Addendum)
  Call for a PT referral if your vertigo

## 2016-01-30 DIAGNOSIS — M797 Fibromyalgia: Secondary | ICD-10-CM | POA: Diagnosis not present

## 2016-01-30 DIAGNOSIS — M1990 Unspecified osteoarthritis, unspecified site: Secondary | ICD-10-CM | POA: Diagnosis not present

## 2016-01-30 DIAGNOSIS — G8929 Other chronic pain: Secondary | ICD-10-CM | POA: Diagnosis not present

## 2016-01-30 DIAGNOSIS — M545 Low back pain: Secondary | ICD-10-CM | POA: Diagnosis not present

## 2016-02-29 ENCOUNTER — Other Ambulatory Visit: Payer: Self-pay | Admitting: *Deleted

## 2016-02-29 MED ORDER — LISINOPRIL-HYDROCHLOROTHIAZIDE 20-12.5 MG PO TABS: ORAL_TABLET | ORAL | Status: DC

## 2016-02-29 NOTE — Telephone Encounter (Signed)
Psychiatrist

## 2016-03-06 DIAGNOSIS — M545 Low back pain: Secondary | ICD-10-CM | POA: Diagnosis not present

## 2016-03-06 DIAGNOSIS — G8929 Other chronic pain: Secondary | ICD-10-CM | POA: Diagnosis not present

## 2016-03-06 DIAGNOSIS — M1990 Unspecified osteoarthritis, unspecified site: Secondary | ICD-10-CM | POA: Diagnosis not present

## 2016-03-06 DIAGNOSIS — M797 Fibromyalgia: Secondary | ICD-10-CM | POA: Diagnosis not present

## 2016-03-06 DIAGNOSIS — Z6827 Body mass index (BMI) 27.0-27.9, adult: Secondary | ICD-10-CM | POA: Diagnosis not present

## 2016-03-19 ENCOUNTER — Telehealth: Payer: Self-pay | Admitting: *Deleted

## 2016-03-19 NOTE — Telephone Encounter (Signed)
Patient needs a letter stating that she uses [employer] for housing. Patient would like it mailed to her when finished.   Letter printed and placed in folder for Dr. Mariea Clonts to review and sign.

## 2016-03-20 NOTE — Telephone Encounter (Signed)
DOB added and letter mailed.

## 2016-03-20 NOTE — Telephone Encounter (Signed)
Signed, please add DOB and send.

## 2016-03-31 DIAGNOSIS — M1990 Unspecified osteoarthritis, unspecified site: Secondary | ICD-10-CM | POA: Diagnosis not present

## 2016-03-31 DIAGNOSIS — M25551 Pain in right hip: Secondary | ICD-10-CM | POA: Diagnosis not present

## 2016-03-31 DIAGNOSIS — M5416 Radiculopathy, lumbar region: Secondary | ICD-10-CM | POA: Diagnosis not present

## 2016-03-31 DIAGNOSIS — L409 Psoriasis, unspecified: Secondary | ICD-10-CM | POA: Diagnosis not present

## 2016-03-31 DIAGNOSIS — M797 Fibromyalgia: Secondary | ICD-10-CM | POA: Diagnosis not present

## 2016-03-31 DIAGNOSIS — M150 Primary generalized (osteo)arthritis: Secondary | ICD-10-CM | POA: Diagnosis not present

## 2016-04-07 DIAGNOSIS — H43813 Vitreous degeneration, bilateral: Secondary | ICD-10-CM | POA: Diagnosis not present

## 2016-04-07 DIAGNOSIS — H401132 Primary open-angle glaucoma, bilateral, moderate stage: Secondary | ICD-10-CM | POA: Diagnosis not present

## 2016-04-07 DIAGNOSIS — Z961 Presence of intraocular lens: Secondary | ICD-10-CM | POA: Diagnosis not present

## 2016-04-29 ENCOUNTER — Other Ambulatory Visit: Payer: Self-pay | Admitting: *Deleted

## 2016-04-29 MED ORDER — PANTOPRAZOLE SODIUM 40 MG PO TBEC: DELAYED_RELEASE_TABLET | ORAL | 3 refills | Status: DC

## 2016-04-29 NOTE — Telephone Encounter (Signed)
[facility] mail service

## 2016-05-01 ENCOUNTER — Ambulatory Visit (INDEPENDENT_AMBULATORY_CARE_PROVIDER_SITE_OTHER): Payer: Medicare Other | Admitting: Internal Medicine

## 2016-05-01 ENCOUNTER — Encounter: Payer: Self-pay | Admitting: Internal Medicine

## 2016-05-01 ENCOUNTER — Other Ambulatory Visit: Payer: Self-pay | Admitting: Internal Medicine

## 2016-05-01 VITALS — BP 118/68 | HR 70 | Temp 98.4°F | Wt 143.0 lb

## 2016-05-01 DIAGNOSIS — N3941 Urge incontinence: Secondary | ICD-10-CM | POA: Diagnosis not present

## 2016-05-01 DIAGNOSIS — Z23 Encounter for immunization: Secondary | ICD-10-CM | POA: Diagnosis not present

## 2016-05-01 DIAGNOSIS — R350 Frequency of micturition: Secondary | ICD-10-CM

## 2016-05-01 DIAGNOSIS — R5383 Other fatigue: Secondary | ICD-10-CM | POA: Diagnosis not present

## 2016-05-01 LAB — POCT URINALYSIS DIPSTICK
Blood, UA: NEGATIVE
Glucose, UA: NEGATIVE
Ketones, UA: NEGATIVE
Leukocytes, UA: NEGATIVE
Nitrite, UA: NEGATIVE
Protein, UA: NEGATIVE
Spec Grav, UA: <=1.005
Urobilinogen, UA: NEGATIVE
pH, UA: 8

## 2016-05-01 LAB — VITAMIN B12: Vitamin B-12: 955 pg/mL (ref 200–1100)

## 2016-05-01 NOTE — Progress Notes (Signed)
Location:  [facility] clinic Provider: Garcia Dalzell L. Mariea Clonts, D.O., C.M.D.  Code Status: DNR Goals of Care:  Advanced Directives 01/24/2016  Does patient have an advance directive? Yes  Type of Advance Directive [place]  Does patient want to make changes to advanced directive? -  Copy of advanced directive(s) in chart? Yes     Chief Complaint  Patient presents with  . Acute Visit    fatigue    HPI: Patient is a 82 y.o. female seen today for an acute visit for fatigue, feeling tired all of the time. [place] out of bed a few weeks ago.  She was sleeping, turned over and was on the floor.  EMS got her back up. She still has bruises on her left shin.  Right elbow has a skin tear.    She has no pep at all.  She can just sit in a chair and fall asleep.    She is having bladder problems.  She's wearing a pad.  It started when she was home Friday--she kept leaking and is afraid to go w/o one now.  No dysuria, but can't hold it.  Is going frequently.  Has never had a UTI before.  Does drink a lot of water.  No blood in urine.  No flank pain.  No fevers, chills.  Was only having occasional incontinence before  She is down 3 lbs since last appt (had been gaining).    Has not had medication changes.    There has been no change in her prednisone dose for some time.  She has glaucoma and now on three eye drops per day.    Past Medical History:  Diagnosis Date  . Arthritis    Osteoarthritis  . Depression   . GERD (gastroesophageal reflux disease)   . Hyperlipidemia   . Osteopenia     Past Surgical History:  Procedure Laterality Date  . APPENDECTOMY  1936  . CERVICAL FUSION  2004  . COLON SURGERY  06/12/2010   Colonoscopy  . FINGER SURGERY  1989  . SPINE SURGERY  2009   Back Fusion  . TONSILLECTOMY  1941    Allergies  Allergen Reactions  . Penicillins Rash      Medication List       Accurate as of 05/01/16 10:44 AM. Always use your most recent med list.          alendronate 70 MG tablet Commonly known as:  FOSAMAX Take one tablet by mouth weekly.  Stay upright for 1 hour after taking.   ALIGN PO Take by mouth daily.   aspirin 81 MG tablet Take 81 mg by mouth daily.   brimonidine 0.15 % ophthalmic solution Commonly known as:  ALPHAGAN INT 1 GTT INTO OU BID   cholecalciferol 1000 units tablet Commonly known as:  VITAMIN D Take 1,000 Units by mouth 2 (two) times daily.   dorzolamide-timolol 22.3-6.8 MG/ML ophthalmic solution Commonly known as:  COSOPT Place 1 drop into both eyes daily.   FLUoxetine 20 MG tablet Commonly known as:  PROZAC TAKE 1 BY MOUTH DAILY   gabapentin 300 MG capsule Commonly known as:  NEURONTIN   latanoprost 0.005 % ophthalmic solution Commonly known as:  XALATAN Place 1 drop into both eyes at bedtime.   lisinopril-hydrochlorothiazide 20-12.5 MG tablet Commonly known as:  PRINZIDE,ZESTORETIC Take one tablet by mouth once daily for blood pressure   ofloxacin 0.3 % otic solution Commonly known as:  FLOXIN Place 5 drops into  the right ear daily. Until drops used up   oxyCODONE-acetaminophen 5-325 MG tablet Commonly known as:  PERCOCET/ROXICET Take 1 tablet by mouth 3 (three) times daily as needed for pain.   pantoprazole 40 MG tablet Commonly known as:  PROTONIX Take one tablet by mouth once daily for acid reflux   predniSONE 1 MG tablet Commonly known as:  DELTASONE 3 tablets Once a day Orally 90 days   simvastatin 20 MG tablet Commonly known as:  ZOCOR TAKE 1 BY MOUTH DAILY FOR CHOLESTEROL   triamcinolone cream 0.1 % Commonly known as:  KENALOG 1 application apply on the skin twice a day       Review of Systems:  Review of Systems  Constitutional: Positive for malaise/fatigue and weight loss. Negative for chills, diaphoresis and fever.  HENT: Negative for congestion and hearing loss.   Eyes: Positive for blurred vision.       Glaucoma  Respiratory: Negative for shortness of breath.    Cardiovascular: Negative for chest pain, palpitations and leg swelling.       Previously had edema Pre-hctz  Gastrointestinal: Negative for abdominal pain, blood in stool, constipation, diarrhea, heartburn, melena, nausea and vomiting.  Genitourinary: Positive for frequency and urgency. Negative for dysuria, flank pain and hematuria.       Leakage, increased incontinence  Musculoskeletal: Positive for falls and joint pain.       Fell oob  Skin: Negative for itching and rash.  Neurological: Negative for dizziness, loss of consciousness and weakness.  Endo/Heme/Allergies: Bruises/bleeds easily.  Psychiatric/Behavioral: Negative for depression and memory loss. The patient is nervous/anxious.     Health Maintenance  Topic Date Due  . INFLUENZA VACCINE  03/18/2016  . TETANUS/TDAP  03/03/2024  . DEXA SCAN  Completed  . ZOSTAVAX  Completed  . PNA vac Low Risk Adult  Completed    Physical Exam: Vitals:   05/01/16 1038  BP: 118/68  Pulse: 70  Temp: 98.4 F (36.9 C)  TempSrc: Oral  SpO2: 95%  Weight: 143 lb (64.9 kg)   Body mass index is 27.93 kg/m. Physical Exam  Constitutional: She is oriented to person, place, and time. She appears well-developed and well-nourished. No distress.  Cardiovascular: Normal rate, regular rhythm, normal heart sounds and intact distal pulses.   Pulmonary/Chest: Effort normal and breath sounds normal. No respiratory distress.  Abdominal: Soft. Bowel sounds are normal. She exhibits no distension and no mass. There is no tenderness. There is no rebound and no guarding. No hernia.  Central obesity  Genitourinary:  Genitourinary Comments: No suprapubic tenderness  Musculoskeletal: Normal range of motion. She exhibits tenderness.  Neurological: She is alert and oriented to person, place, and time.  Skin: Skin is warm and dry. Capillary refill takes less than 2 seconds.  Psychiatric: She has a normal mood and affect.    Labs reviewed: Basic Metabolic  Panel:  Recent Labs  07/24/15 0948 01/21/16 1011  NA 144 143  K 3.9 4.1  CL 101 100  CO2 29 27  GLUCOSE 88 97  BUN 13 16  CREATININE 0.66 0.70  CALCIUM 9.8 9.4   Liver Function Tests:  Recent Labs  07/24/15 0948 01/21/16 1011  AST 19 13  ALT 15 14  ALKPHOS 48 51  BILITOT 0.3 0.2  PROT 6.0 5.8*  ALBUMIN 4.0 4.0   No results for input(s): LIPASE, AMYLASE in the last 8760 hours. No results for input(s): AMMONIA in the last 8760 hours. CBC:  Recent Labs  07/24/15  [id number] 01/21/16 1011  WBC 5.2 6.5  NEUTROABS 2.5 3.6  HCT 39.4 37.8  MCV 87 85  PLT 254 269   Lipid Panel:  Recent Labs  07/24/15 0948 01/21/16 1011  CHOL 208* 203*  HDL 77 69  LDLCALC 84 87  TRIG 235* 234*  CHOLHDL 2.7 2.9   Lab Results  Component Value Date   HGBA1C 6.4 (H) 01/21/2016    Assessment/Plan 1. Other fatigue - last labs wnl, check a few we have not assessed any time lately: - Culture, Urine - POCT urinalysis dipstick - Vitamin B12 - Vitamin D, 25-hydroxy  2. Urinary frequency - this is newly worse so r/o infection but suspect overactive bladder developing - Culture, Urine - POCT urinalysis dipstick was negative grossly, but due to symptoms culture sent  3. Urge incontinence of urine -suspect OAB, but r/o UTI: - Culture, Urine - POCT urinalysis dipstick  4. Need for immunization against influenza - Flu Vaccine QUAD 36+ mos PF IM (Fluarix & Fluzone Quad PF) given today  Labs/tests ordered:   Orders Placed This Encounter  Procedures  . Culture, Urine  . Flu Vaccine QUAD 36+ mos PF IM (Fluarix & Fluzone Quad PF)  . Vitamin B12  . Vitamin D, 25-hydroxy  . POCT urinalysis dipstick    Next appt:  07/25/2016  Anvita Hirata L. Makena Murdock, D.O. [facility] [address], [place] [address] Cell Phone (Mon-Fri 8am-5pm):  [phone] On Call:  [phone] & follow prompts after 5pm & weekends Office Phone:   [phone] Office Fax:  [phone]

## 2016-05-02 LAB — VITAMIN D 25 HYDROXY (VIT D DEFICIENCY, FRACTURES): Vit D, 25-Hydroxy: 67 ng/mL (ref 30–100)

## 2016-05-02 LAB — URINE CULTURE

## 2016-05-05 ENCOUNTER — Encounter: Payer: Self-pay | Admitting: *Deleted

## 2016-05-07 ENCOUNTER — Telehealth: Payer: Self-pay | Admitting: *Deleted

## 2016-05-07 MED ORDER — MIRABEGRON ER 25 MG PO TB24: 25.0000 mg | ORAL_TABLET | Freq: Every day | ORAL | 3 refills | Status: DC

## 2016-05-07 NOTE — Addendum Note (Signed)
Addended by: Rafael Bihari A on: 05/07/2016 04:54 PM   Modules accepted: Orders

## 2016-05-07 NOTE — Telephone Encounter (Signed)
Patient wants the medication faxed to [facility] not [facility]. Refaxed.

## 2016-05-07 NOTE — Telephone Encounter (Signed)
Patient called and left message stating she wanted her results from last week. And stated we could leave message if she didn' t answer.  Called patient back and left message of results and faxed Rx to pharmacy.

## 2016-05-08 ENCOUNTER — Telehealth: Payer: Self-pay

## 2016-05-08 DIAGNOSIS — M25552 Pain in left hip: Secondary | ICD-10-CM | POA: Diagnosis not present

## 2016-05-08 DIAGNOSIS — M545 Low back pain: Secondary | ICD-10-CM | POA: Diagnosis not present

## 2016-05-08 NOTE — Telephone Encounter (Signed)
Prior authorization completed online via Covermymeds:  Your information has been submitted to [facility]. [place] will review the request and notify you of the determination decision directly, typically within 3 business days of your submission and once all necessary information is received. You will also receive your request decision electronically. To check for an update later, open the request again from your dashboard.

## 2016-05-08 NOTE — Telephone Encounter (Signed)
Message received from insurance company, medication approved x 1 year 05/08/16-05/08/17. Per recording patient and pharmacy notified

## 2016-05-12 DIAGNOSIS — M25552 Pain in left hip: Secondary | ICD-10-CM | POA: Diagnosis not present

## 2016-05-12 DIAGNOSIS — M545 Low back pain: Secondary | ICD-10-CM | POA: Diagnosis not present

## 2016-05-19 DIAGNOSIS — M25552 Pain in left hip: Secondary | ICD-10-CM | POA: Diagnosis not present

## 2016-05-19 DIAGNOSIS — M545 Low back pain: Secondary | ICD-10-CM | POA: Diagnosis not present

## 2016-05-21 ENCOUNTER — Telehealth: Payer: Self-pay | Admitting: *Deleted

## 2016-05-21 NOTE — Telephone Encounter (Signed)
Samples left up front for pick up and patient notified.

## 2016-05-21 NOTE — Telephone Encounter (Signed)
Patient called and stated that she cannot afford the Myrebetriq $478.46/30 days. She called [employer] and they told her that Oxybutynin 5mg  and Tolterodine Tartrate ER 4mg  are covered at $6.00 and patient would like to switch. Please Advise.

## 2016-05-21 NOTE — Telephone Encounter (Signed)
The other two medications are not safe for her to take.  Can we give her more samples?

## 2016-05-23 ENCOUNTER — Other Ambulatory Visit: Payer: Self-pay

## 2016-05-23 DIAGNOSIS — Z0000 Encounter for general adult medical examination without abnormal findings: Secondary | ICD-10-CM

## 2016-05-23 NOTE — Addendum Note (Signed)
Addended by: Denyse Amass on: 05/23/2016 02:41 PM   Modules accepted: Orders

## 2016-05-26 DIAGNOSIS — M545 Low back pain: Secondary | ICD-10-CM | POA: Diagnosis not present

## 2016-05-26 DIAGNOSIS — M25552 Pain in left hip: Secondary | ICD-10-CM | POA: Diagnosis not present

## 2016-05-29 DIAGNOSIS — M545 Low back pain: Secondary | ICD-10-CM | POA: Diagnosis not present

## 2016-05-29 DIAGNOSIS — M25552 Pain in left hip: Secondary | ICD-10-CM | POA: Diagnosis not present

## 2016-06-02 DIAGNOSIS — M545 Low back pain: Secondary | ICD-10-CM | POA: Diagnosis not present

## 2016-06-02 DIAGNOSIS — M25552 Pain in left hip: Secondary | ICD-10-CM | POA: Diagnosis not present

## 2016-06-03 ENCOUNTER — Other Ambulatory Visit: Payer: Self-pay | Admitting: *Deleted

## 2016-06-03 MED ORDER — SIMVASTATIN 20 MG PO TABS: ORAL_TABLET | ORAL | 3 refills | Status: DC

## 2016-06-03 NOTE — Telephone Encounter (Signed)
Psychiatrist

## 2016-06-24 DIAGNOSIS — G894 Chronic pain syndrome: Secondary | ICD-10-CM | POA: Diagnosis not present

## 2016-06-24 DIAGNOSIS — M797 Fibromyalgia: Secondary | ICD-10-CM | POA: Diagnosis not present

## 2016-06-24 DIAGNOSIS — L409 Psoriasis, unspecified: Secondary | ICD-10-CM | POA: Diagnosis not present

## 2016-06-24 DIAGNOSIS — M5416 Radiculopathy, lumbar region: Secondary | ICD-10-CM | POA: Diagnosis not present

## 2016-06-24 DIAGNOSIS — M150 Primary generalized (osteo)arthritis: Secondary | ICD-10-CM | POA: Diagnosis not present

## 2016-06-24 DIAGNOSIS — M1990 Unspecified osteoarthritis, unspecified site: Secondary | ICD-10-CM | POA: Diagnosis not present

## 2016-06-30 ENCOUNTER — Encounter (HOSPITAL_COMMUNITY): Payer: Self-pay | Admitting: Nurse Practitioner

## 2016-06-30 ENCOUNTER — Emergency Department (HOSPITAL_COMMUNITY): Payer: Medicare Other

## 2016-06-30 ENCOUNTER — Emergency Department (HOSPITAL_COMMUNITY)
Admission: EM | Admit: 2016-06-30 | Discharge: 2016-06-30 | Disposition: A | Discharge status: Home / Self Care | Payer: Medicare Other | Attending: Emergency Medicine | Admitting: Emergency Medicine

## 2016-06-30 DIAGNOSIS — I10 Essential (primary) hypertension: Secondary | ICD-10-CM | POA: Diagnosis not present

## 2016-06-30 DIAGNOSIS — Y939 Activity, unspecified: Secondary | ICD-10-CM | POA: Insufficient documentation

## 2016-06-30 DIAGNOSIS — Y929 Unspecified place or not applicable: Secondary | ICD-10-CM | POA: Insufficient documentation

## 2016-06-30 DIAGNOSIS — S81812A Laceration without foreign body, left lower leg, initial encounter: Secondary | ICD-10-CM

## 2016-06-30 DIAGNOSIS — W228XXA Striking against or struck by other objects, initial encounter: Secondary | ICD-10-CM | POA: Insufficient documentation

## 2016-06-30 DIAGNOSIS — Z7982 Long term (current) use of aspirin: Secondary | ICD-10-CM | POA: Diagnosis not present

## 2016-06-30 DIAGNOSIS — Y999 Unspecified external cause status: Secondary | ICD-10-CM | POA: Diagnosis not present

## 2016-06-30 DIAGNOSIS — Z87891 Personal history of nicotine dependence: Secondary | ICD-10-CM | POA: Insufficient documentation

## 2016-06-30 MED ORDER — LIDOCAINE-EPINEPHRINE (PF) 2 %-1:200000 IJ SOLN
20.0000 mL | Freq: Once | INTRAMUSCULAR | Status: AC
  Administered 2016-06-30: 20 mL
  Filled 2016-06-30: qty 20

## 2016-06-30 MED ORDER — ACETAMINOPHEN 500 MG PO TABS
1000.0000 mg | ORAL_TABLET | Freq: Once | ORAL | Status: AC
  Administered 2016-06-30: 1000 mg via ORAL
  Filled 2016-06-30: qty 2

## 2016-06-30 NOTE — ED Provider Notes (Signed)
[facility] DEPT Provider Note   CSN: [id number] Arrival date & time: 06/30/16  1116     History   Chief Complaint Chief Complaint  Patient presents with  . Leg Injury    HPI [name] is a 82 y.o. female.  Pt presents to the ED with a laceration to her left lower leg.  Pt said she hit her leg on a chair.  She denies any other injury.      Past Medical History:  Diagnosis Date  . Arthritis    Osteoarthritis  . Depression   . GERD (gastroesophageal reflux disease)   . Hyperlipidemia   . Osteopenia     Patient Active Problem List   Diagnosis Date Noted  . Chronic venous insufficiency 07/26/2015  . Urge incontinence of urine 07/26/2015  . Rheumatoid arthritis involving multiple sites ([place]) 07/26/2015  . Depression 07/26/2015  . Glaucoma 07/26/2015  . Hypertriglyceridemia 07/26/2015  . Postmenopausal estrogen deficiency 07/26/2015  . Primary open angle glaucoma 01/02/2014  . Hyperglycemia 11/18/2012  . Hypertriglyceridemia, essential 11/18/2012  . Hyperlipidemia LDL goal < 100 11/18/2012  . Essential hypertension, benign 11/18/2012  . 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 APPENDECTOMY  1936  . CERVICAL FUSION  2004  . COLON SURGERY  06/12/2010   Colonoscopy  . FINGER SURGERY  1989  . SPINE SURGERY  2009   Back Fusion  . TONSILLECTOMY  1941    OB History    No data available       Home Medications    Prior to Admission medications   Medication Sig Start Date End Date Taking? Authorizing Provider  alendronate (FOSAMAX) 70 MG tablet Take one tablet by mouth weekly.  Stay upright for 1 hour after taking. 08/14/15   Tiffany L Reed, DO  aspirin 81 MG tablet Take 81 mg by mouth daily.    Historical Provider, MD  brimonidine (ALPHAGAN) 0.15 % ophthalmic solution INT 1 GTT INTO OU BID 12/27/15   Historical Provider, MD  cholecalciferol (VITAMIN D) 1000 UNITS tablet Take  1,000 Units by mouth 2 (two) times daily.     Historical Provider, MD  dorzolamide-timolol (COSOPT) 22.3-6.8 MG/ML ophthalmic solution Place 1 drop into both eyes daily.    Historical Provider, MD  FLUoxetine (PROZAC) 20 MG tablet TAKE 1 BY MOUTH DAILY 07/17/15   Tiffany L Reed, DO  gabapentin (NEURONTIN) 300 MG capsule  09/01/15   Historical Provider, MD  latanoprost (XALATAN) 0.005 % ophthalmic solution Place 1 drop into both eyes at bedtime.  08/31/12   Historical Provider, MD  lisinopril-hydrochlorothiazide (PRINZIDE,ZESTORETIC) 20-12.5 MG tablet Take one tablet by mouth once daily for blood pressure 02/29/16   Tiffany L Reed, DO  mirabegron ER (MYRBETRIQ) 25 MG TB24 tablet Take 1 tablet (25 mg total) by mouth daily. 05/07/16   Tiffany L Reed, DO  ofloxacin (FLOXIN) 0.3 % otic solution Place 5 drops into the right ear daily. Until drops used up 01/18/16   Tiffany L Reed, DO  oxyCODONE-acetaminophen (PERCOCET/ROXICET) 5-325 MG per tablet Take 1 tablet by mouth 3 (three) times daily as needed for pain.     Historical Provider, MD  pantoprazole (PROTONIX) 40 MG tablet Take one tablet by mouth once daily for acid reflux 04/29/16   Tiffany L Reed, DO  predniSONE (DELTASONE) 1 MG tablet 3 tablets Once a day Orally 90 days 12/13/15   Historical Provider, MD  Probiotic Product (ALIGN  PO) Take by mouth daily.    Historical Provider, MD  simvastatin (ZOCOR) 20 MG tablet Take one tablet by mouth once daily for choelsterol 06/03/16   Tiffany L Reed, DO  triamcinolone cream (KENALOG) 0.1 % 1 application apply on the skin twice a day 10/17/15   Historical Provider, MD    Family History History reviewed. No pertinent family history.  Social History Social History  Substance Use Topics  . Smoking status: Former Research scientist (life sciences)  . Smokeless tobacco: Never Used  . Alcohol use No     Allergies   Penicillins   Review of Systems Review of Systems  Skin: Positive for wound.  All other systems reviewed and are  negative.    Physical Exam Updated Vital Signs BP (!) 132/47 (BP Location: Left Arm)   Pulse 68   Temp 98.2 F (36.8 C) (Oral)   Resp 17   Ht 5' (1.524 m)   Wt 140 lb (63.5 kg)   SpO2 97%   BMI 27.34 kg/m   Physical Exam  Constitutional: She is oriented to person, place, and time. She appears well-developed and well-nourished.  HENT:  Head: Normocephalic and atraumatic.  Right Ear: External ear normal.  Left Ear: External ear normal.  Nose: Nose normal.  Mouth/Throat: Oropharynx is clear and moist.  Eyes: Conjunctivae and EOM are normal. Pupils are equal, round, and reactive to light.  Neck: Normal range of motion. Neck supple.  Cardiovascular: Normal rate, regular rhythm, normal heart sounds and intact distal pulses.   Pulmonary/Chest: Effort normal and breath sounds normal.  Abdominal: Soft. Bowel sounds are normal.  Musculoskeletal: Normal range of motion.  Neurological: She is alert and oriented to person, place, and time.  Skin: Skin is warm.     Psychiatric: She has a normal mood and affect. Her behavior is normal. Judgment and thought content normal.  Nursing note and vitals reviewed.    ED Treatments / Results  Labs (all labs ordered are listed, but only abnormal results are displayed) Labs Reviewed - No data to display  EKG  EKG Interpretation None       Radiology Dg Tibia/fibula Left  Result Date: 06/30/2016 CLINICAL DATA:  Soft tissue injury after rubbing against a coffee table this morning. Patient has a laceration over the lateral aspect of the leg just below the knee joint extending inferiorly to the mid calf. EXAM: LEFT TIBIA AND FIBULA - 2 VIEW COMPARISON:  None in PACs FINDINGS: The bones are subjectively adequately mineralized. There is no acute fracture. The observed portions of the knee and ankle exhibit no acute abnormalities. There is no soft tissue foreign body nor other acute soft tissue abnormality. IMPRESSION: There is no acute bony  abnormality of the left tibia or fibula. Electronically Signed   By: David  Martinique M.D.   On: 06/30/2016 12:50    Procedures .Marland KitchenLaceration Repair Date/Time: 06/30/2016 1:48 PM Performed by: Isla Pence Authorized by: Isla Pence   Consent:    Consent obtained:  Verbal   Consent given by:  Patient   Risks discussed:  Infection, need for additional repair and pain   Alternatives discussed:  No treatment Anesthesia (see MAR for exact dosages):    Anesthesia method:  Local infiltration   Local anesthetic:  Lidocaine 2% WITH epi Laceration details:    Location:  Leg   Leg location:  L lower leg   Length (cm):  9 Repair type:    Repair type:  Simple Pre-procedure details:    Preparation:  Patient was prepped and draped in usual sterile fashion Exploration:    Hemostasis achieved with:  Epinephrine   Contaminated: no   Treatment:    Area cleansed with:  Saline   Amount of cleaning:  Standard   Irrigation solution:  Sterile saline Skin repair:    Repair method:  Sutures   Suture size:  4-0   Suture material:  Nylon Approximation:    Approximation:  Close   Vermilion border: well-aligned   Post-procedure details:    Dressing:  Antibiotic ointment, non-adherent dressing and sterile dressing   Patient tolerance of procedure:  Tolerated well, no immediate complications Comments:     Pt's skin was very thin, so the sutures pulled her skin.  Pt aware.   (including critical care time)  Medications Ordered in ED Medications  acetaminophen (TYLENOL) tablet 1,000 mg (1,000 mg Oral Given 06/30/16 1227)  lidocaine-EPINEPHrine (XYLOCAINE W/EPI) 2 %-1:200000 (PF) injection 20 mL (20 mLs Infiltration Given by Other 06/30/16 1228)     Initial Impression / Assessment and Plan / ED Course  I have reviewed the triage vital signs and the nursing notes.  Pertinent labs & imaging results that were available during my care of the patient were reviewed by me and considered in my  medical decision making (see chart for details).  Clinical Course       Final Clinical Impressions(s) / ED Diagnoses   Final diagnoses:  Laceration of left lower extremity, initial encounter    New Prescriptions Discharge Medication List as of 06/30/2016  1:13 PM       Isla Pence, MD 06/30/16 1350

## 2016-06-30 NOTE — ED Triage Notes (Signed)
Pt presents with c/o leg injury. She has a laceration/avulsion type wound to L anterior calf area. The wound occurred this morning when she hit her leg on a chair. She c/o pain at the wound site. She denies any other injuries.

## 2016-06-30 NOTE — ED Notes (Signed)
PT is in stable condition upon dc and is escorted from ED via wheelchair. 

## 2016-07-07 ENCOUNTER — Ambulatory Visit (INDEPENDENT_AMBULATORY_CARE_PROVIDER_SITE_OTHER): Payer: Medicare Other | Admitting: Internal Medicine

## 2016-07-07 ENCOUNTER — Encounter: Payer: Self-pay | Admitting: Internal Medicine

## 2016-07-07 VITALS — BP 120/70 | HR 70 | Temp 97.9°F | Wt 144.0 lb

## 2016-07-07 DIAGNOSIS — S81812A Laceration without foreign body, left lower leg, initial encounter: Secondary | ICD-10-CM

## 2016-07-07 DIAGNOSIS — N3281 Overactive bladder: Secondary | ICD-10-CM

## 2016-07-07 NOTE — Patient Instructions (Addendum)
Keep the area covered for 3 more days, then remove the outer covering.   Steristrips will gradually fall off.

## 2016-07-07 NOTE — Progress Notes (Signed)
Location:  [facility] clinic Provider: Estefania Kamiya L. Mariea Clonts, D.O., C.M.D.  Code Status: DNR Goals of Care:  Advanced Directives 01/24/2016  Does patient have an advance directive? Yes  Type of Advance Directive [place]  Does patient want to make changes to advanced directive? -  Copy of advanced directive(s) in chart? Yes     Chief Complaint  Patient presents with  . Suture / Staple Removal    HPI: Patient is a 82 y.o. female seen today for an acute visit for suture removal s/p injury with laceration of her left leg.  She hit her leg on a chair.  She did not fall.  The sutures have been in for a week.  She had been advised to apply abx ointment, nonadherent dressing and sterile dressing.   She has kept it covered the entire time.  She reports it was very painful.    We removed the sutures.  She was yelling out while I removed each one.  Even cleaning the site was painful and dabbing some blood.  Her skin is very thin and weak and the upper portion was not well approximated.    Past Medical History:  Diagnosis Date  . Arthritis    Osteoarthritis  . Depression   . GERD (gastroesophageal reflux disease)   . Hyperlipidemia   . Osteopenia     Past Surgical History:  Procedure Laterality Date  . APPENDECTOMY  1936  . CERVICAL FUSION  2004  . COLON SURGERY  06/12/2010   Colonoscopy  . FINGER SURGERY  1989  . SPINE SURGERY  2009   Back Fusion  . TONSILLECTOMY  1941    Allergies  Allergen Reactions  . Penicillins Rash      Medication List       Accurate as of 07/07/16  9:23 AM. Always use your most recent med list.          alendronate 70 MG tablet Commonly known as:  FOSAMAX Take one tablet by mouth weekly.  Stay upright for 1 hour after taking.   ALIGN PO Take by mouth daily.   aspirin 81 MG tablet Take 81 mg by mouth daily.   brimonidine 0.15 % ophthalmic solution Commonly known as:  ALPHAGAN INT 1 GTT INTO OU BID   cholecalciferol 1000  units tablet Commonly known as:  VITAMIN D Take 1,000 Units by mouth 2 (two) times daily.   dorzolamide-timolol 22.3-6.8 MG/ML ophthalmic solution Commonly known as:  COSOPT Place 1 drop into both eyes daily.   FLUoxetine 20 MG tablet Commonly known as:  PROZAC TAKE 1 BY MOUTH DAILY   gabapentin 300 MG capsule Commonly known as:  NEURONTIN   latanoprost 0.005 % ophthalmic solution Commonly known as:  XALATAN Place 1 drop into both eyes at bedtime.   lisinopril-hydrochlorothiazide 20-12.5 MG tablet Commonly known as:  PRINZIDE,ZESTORETIC Take one tablet by mouth once daily for blood pressure   mirabegron ER 25 MG Tb24 tablet Commonly known as:  MYRBETRIQ Take 1 tablet (25 mg total) by mouth daily.   oxyCODONE-acetaminophen 5-325 MG tablet Commonly known as:  PERCOCET/ROXICET Take 1 tablet by mouth 3 (three) times daily as needed for pain.   pantoprazole 40 MG tablet Commonly known as:  PROTONIX Take one tablet by mouth once daily for acid reflux   predniSONE 1 MG tablet Commonly known as:  DELTASONE 3 tablets Once a day Orally 90 days   simvastatin 20 MG tablet Commonly known as:  ZOCOR Take one  tablet by mouth once daily for choelsterol   triamcinolone cream 0.1 % Commonly known as:  KENALOG 1 application apply on the skin twice a day       Review of Systems:  Review of Systems  Constitutional: Negative for chills and fever.  HENT: Negative for congestion and hearing loss.   Respiratory: Negative for cough and shortness of breath.   Cardiovascular: Negative for chest pain, palpitations and leg swelling.  Gastrointestinal: Negative for abdominal pain, blood in stool, constipation and melena.  Genitourinary: Positive for frequency and urgency. Negative for dysuria.  Musculoskeletal: Positive for joint pain. Negative for falls.  Skin: Negative for itching and rash.       Skin laceration right shin  Neurological: Negative for dizziness and loss of  consciousness.  Psychiatric/Behavioral: Negative for depression and memory loss.    Health Maintenance  Topic Date Due  . TETANUS/TDAP  03/03/2024  . INFLUENZA VACCINE  Completed  . DEXA SCAN  Completed  . ZOSTAVAX  Completed  . PNA vac Low Risk Adult  Completed    Physical Exam: Vitals:   07/07/16 0917  BP: 120/70  Pulse: 70  Temp: 97.9 F (36.6 C)  TempSrc: Oral  SpO2: 96%  Weight: 144 lb (65.3 kg)   Body mass index is 28.12 kg/m. Physical Exam  Constitutional: She is oriented to person, place, and time. No distress.  Cardiovascular: Normal rate, regular rhythm and normal heart sounds.   Pulmonary/Chest: Effort normal and breath sounds normal.  Neurological: She is alert and oriented to person, place, and time.  Skin:  Right shin with angular shaped skin tear present--vertical portion well approximated, horizontal portion appears it is missing some skin tissue and could not be; there is mild surrounding erythema, no significant drainage; 11 sutures were in place    Labs reviewed: Basic Metabolic Panel:  Recent Labs  07/24/15 0948 01/21/16 1011  NA 144 143  K 3.9 4.1  CL 101 100  CO2 29 27  GLUCOSE 88 97  BUN 13 16  CREATININE 0.66 0.70  CALCIUM 9.8 9.4   Liver Function Tests:  Recent Labs  07/24/15 0948 01/21/16 1011  AST 19 13  ALT 15 14  ALKPHOS 48 51  BILITOT 0.3 0.2  PROT 6.0 5.8*  ALBUMIN 4.0 4.0   No results for input(s): LIPASE, AMYLASE in the last 8760 hours. No results for input(s): AMMONIA in the last 8760 hours. CBC:  Recent Labs  07/24/15 0948 01/21/16 1011  WBC 5.2 6.5  NEUTROABS 2.5 3.6  HCT 39.4 37.8  MCV 87 85  PLT 254 269   Lipid Panel:  Recent Labs  07/24/15 0948 01/21/16 1011  CHOL 208* 203*  HDL 77 69  LDLCALC 84 87  TRIG 235* 234*  CHOLHDL 2.7 2.9   Lab Results  Component Value Date   HGBA1C 6.4 (H) 01/21/2016    Procedures since last visit: Dg Tibia/fibula Left  Result Date: 06/30/2016 CLINICAL  DATA:  Soft tissue injury after rubbing against a coffee table this morning. Patient has a laceration over the lateral aspect of the leg just below the knee joint extending inferiorly to the mid calf. EXAM: LEFT TIBIA AND FIBULA - 2 VIEW COMPARISON:  None in PACs FINDINGS: The bones are subjectively adequately mineralized. There is no acute fracture. The observed portions of the knee and ankle exhibit no acute abnormalities. There is no soft tissue foreign body nor other acute soft tissue abnormality. IMPRESSION: There is no acute bony abnormality  of the left tibia or fibula. Electronically Signed   By: David  Martinique M.D.   On: 06/30/2016 12:50    Assessment/Plan 1. Laceration of left lower extremity, initial encounter -in patient with fragile skin from age and steroid use -sutures were removed (large amts of blood were covering some) -steristrips were put in place, then wound wrapped with kerlix; advised to remove wrap in 2 days and leave open to air if not draining significantly  2. OAB (overactive bladder) -cont myrbetriq--pt was offered more samples   Labs/tests ordered:  No orders of the defined types were placed in this encounter.   Next appt:  07/25/2016  Der Gagliano L. Tylan Briguglio, D.O. [facility] [address], [place] [address] Cell Phone (Mon-Fri 8am-5pm):  [phone] On Call:  [phone] & follow prompts after 5pm & weekends Office Phone:  [phone] Office Fax:  [phone]

## 2016-07-21 ENCOUNTER — Encounter: Payer: Self-pay | Admitting: Nurse Practitioner

## 2016-07-21 ENCOUNTER — Ambulatory Visit (INDEPENDENT_AMBULATORY_CARE_PROVIDER_SITE_OTHER): Payer: Medicare Other | Admitting: Nurse Practitioner

## 2016-07-21 VITALS — BP 124/82 | HR 72 | Temp 98.0°F | Resp 18 | Ht 60.0 in | Wt 140.6 lb

## 2016-07-21 DIAGNOSIS — S81812S Laceration without foreign body, left lower leg, sequela: Secondary | ICD-10-CM

## 2016-07-21 NOTE — Patient Instructions (Signed)
Apply neosporin twice daily until healed To cover when you are going out but may leave open to air when around the house Notify if increase drainage, redness occurs

## 2016-07-21 NOTE — Progress Notes (Signed)
Careteam: Patient Care Team: Gayland Curry, DO as PCP - General (Geriatric Medicine) Warden Fillers, MD as Consulting Physician (Ophthalmology)  Advanced Directive information Does Patient Have a Medical Advance Directive?: Yes, Type of Advance Directive: Healthcare Power of Attorney  Allergies  Allergen Reactions  . Penicillins Rash    Chief Complaint  Patient presents with  . Acute Visit    Stitches removed from left leg two weeks ago and pt has concerns that wound is not healing.      HPI: Patient is a 82 y.o. female seen in the office today due to follow up on suture removal from injury due to laceration. Feels like its not healing quick enough. No fevers or chills. Minimal drainage. Minimal pain when it is touched. No odor or discoloration to skin.  Steri strips still intact. Has dressing over area.  No fever or chills.   Review of Systems:  Review of Systems  Constitutional: Negative for activity change, appetite change, fatigue and fever.  Skin: Positive for wound (skin laceration to right shin). Negative for color change.    Past Medical History:  Diagnosis Date  . Arthritis    Osteoarthritis  . Depression   . GERD (gastroesophageal reflux disease)   . Hyperlipidemia   . Osteopenia    Past Surgical History:  Procedure Laterality Date  . APPENDECTOMY  1936  . CERVICAL FUSION  2004  . COLON SURGERY  06/12/2010   Colonoscopy  . FINGER SURGERY  1989  . SPINE SURGERY  2009   Back Fusion  . TONSILLECTOMY  1941   Social History:   reports that she has quit smoking. She has never used smokeless tobacco. She reports that she does not drink alcohol or use drugs.  History reviewed. No pertinent family history.  Medications: Patient's Medications  New Prescriptions   No medications on file  Previous Medications   ALENDRONATE (FOSAMAX) 70 MG TABLET    Take one tablet by mouth weekly.  Stay upright for 1 hour after taking.   ASPIRIN 81 MG TABLET     Take 81 mg by mouth daily.   BRIMONIDINE (ALPHAGAN) 0.15 % OPHTHALMIC SOLUTION    INT 1 GTT INTO OU BID   CHOLECALCIFEROL (VITAMIN D) 1000 UNITS TABLET    Take 1,000 Units by mouth 2 (two) times daily.    DORZOLAMIDE-TIMOLOL (COSOPT) 22.3-6.8 MG/ML OPHTHALMIC SOLUTION    Place 1 drop into both eyes daily.   FLUOXETINE (PROZAC) 20 MG TABLET    TAKE 1 BY MOUTH DAILY   GABAPENTIN (NEURONTIN) 300 MG CAPSULE       LATANOPROST (XALATAN) 0.005 % OPHTHALMIC SOLUTION    Place 1 drop into both eyes at bedtime.    LISINOPRIL-HYDROCHLOROTHIAZIDE (PRINZIDE,ZESTORETIC) 20-12.5 MG TABLET    Take one tablet by mouth once daily for blood pressure   MIRABEGRON ER (MYRBETRIQ) 25 MG TB24 TABLET    Take 1 tablet (25 mg total) by mouth daily.   OXYCODONE-ACETAMINOPHEN (PERCOCET/ROXICET) 5-325 MG PER TABLET    Take 1 tablet by mouth 3 (three) times daily as needed for pain.    PANTOPRAZOLE (PROTONIX) 40 MG TABLET    Take one tablet by mouth once daily for acid reflux   PREDNISONE (DELTASONE) 1 MG TABLET    3 tablets Once a day Orally 90 days   PROBIOTIC PRODUCT (ALIGN PO)    Take by mouth daily.   SIMVASTATIN (ZOCOR) 20 MG TABLET    Take one tablet by mouth once daily  for choelsterol   TRIAMCINOLONE CREAM (KENALOG) 0.1 %    1 application apply on the skin twice a day  Modified Medications   No medications on file  Discontinued Medications   No medications on file     Physical Exam:  Vitals:   07/21/16 1135  BP: 124/82  Pulse: 72  Resp: 18  Temp: 98 F (36.7 C)  TempSrc: Oral  SpO2: 94%  Weight: 140 lb 9.6 oz (63.8 kg)  Height: 5' (1.524 m)   Body mass index is 27.46 kg/m.  Physical Exam  Constitutional: She is oriented to person, place, and time. No distress.  Cardiovascular: Normal rate, regular rhythm and normal heart sounds.   Pulmonary/Chest: Effort normal and breath sounds normal.  Neurological: She is alert and oriented to person, place, and time.  Skin:  Steri-strips still on leg, Right  shin with angular shaped skin tear present--vertical portion well approximated, horizontal portion with open areas with yellow slough. No warmth or redness to surrounding tissue    Labs reviewed: Basic Metabolic Panel:  Recent Labs  07/24/15 0948 01/21/16 1011  NA 144 143  K 3.9 4.1  CL 101 100  CO2 29 27  GLUCOSE 88 97  BUN 13 16  CREATININE 0.66 0.70  CALCIUM 9.8 9.4   Liver Function Tests:  Recent Labs  07/24/15 0948 01/21/16 1011  AST 19 13  ALT 15 14  ALKPHOS 48 51  BILITOT 0.3 0.2  PROT 6.0 5.8*  ALBUMIN 4.0 4.0   No results for input(s): LIPASE, AMYLASE in the last 8760 hours. No results for input(s): AMMONIA in the last 8760 hours. CBC:  Recent Labs  07/24/15 0948 01/21/16 1011  WBC 5.2 6.5  NEUTROABS 2.5 3.6  HCT 39.4 37.8  MCV 87 85  PLT 254 269   Lipid Panel:  Recent Labs  07/24/15 0948 01/21/16 1011  CHOL 208* 203*  HDL 77 69  LDLCALC 84 87  TRIG 235* 234*  CHOLHDL 2.7 2.9   TSH: No results for input(s): TSH in the last 8760 hours. A1C: Lab Results  Component Value Date   HGBA1C 6.4 (H) 01/21/2016     Assessment/Plan 1. Skin tear of lower leg without complication, left, sequela -steri strips removed, area cleanse with NS and Bactroban applied to open area. No signs of infection  Apply neosporin twice daily until healed To cover when you are going out but may leave open to air when around the house Notify if increase drainage, redness occurs or without improvement in a week.   Carlos American. Harle Battiest  [facility] [phone] 8 am - 5 pm) [phone] (after hours)

## 2016-07-25 ENCOUNTER — Other Ambulatory Visit: Payer: Self-pay | Admitting: Internal Medicine

## 2016-07-25 ENCOUNTER — Encounter: Payer: Self-pay | Admitting: Internal Medicine

## 2016-07-25 ENCOUNTER — Other Ambulatory Visit: Payer: Medicare Other

## 2016-07-25 ENCOUNTER — Ambulatory Visit (INDEPENDENT_AMBULATORY_CARE_PROVIDER_SITE_OTHER): Payer: Medicare Other | Admitting: Internal Medicine

## 2016-07-25 VITALS — BP 142/70 | HR 78 | Temp 97.4°F | Ht 60.0 in | Wt 140.4 lb

## 2016-07-25 DIAGNOSIS — R739 Hyperglycemia, unspecified: Secondary | ICD-10-CM | POA: Diagnosis not present

## 2016-07-25 DIAGNOSIS — Z0000 Encounter for general adult medical examination without abnormal findings: Secondary | ICD-10-CM

## 2016-07-25 DIAGNOSIS — T148XXA Other injury of unspecified body region, initial encounter: Secondary | ICD-10-CM | POA: Diagnosis not present

## 2016-07-25 DIAGNOSIS — L089 Local infection of the skin and subcutaneous tissue, unspecified: Secondary | ICD-10-CM

## 2016-07-25 DIAGNOSIS — I10 Essential (primary) hypertension: Secondary | ICD-10-CM | POA: Diagnosis not present

## 2016-07-25 DIAGNOSIS — R946 Abnormal results of thyroid function studies: Secondary | ICD-10-CM | POA: Diagnosis not present

## 2016-07-25 DIAGNOSIS — E785 Hyperlipidemia, unspecified: Secondary | ICD-10-CM | POA: Diagnosis not present

## 2016-07-25 LAB — CBC WITH DIFFERENTIAL/PLATELET
Basophils Absolute: 58 cells/uL (ref 0–200)
Basophils Relative: 1 %
Eosinophils Absolute: 174 cells/uL (ref 15–500)
Eosinophils Relative: 3 %
HCT: 36.3 % (ref 35.0–45.0)
Hemoglobin: 11.5 g/dL — ABNORMAL LOW (ref 11.7–15.5)
Lymphocytes Relative: 33 %
Lymphs Abs: 1914 cells/uL (ref 850–3900)
MCH: 26.2 pg — ABNORMAL LOW (ref 27.0–33.0)
MCHC: 31.7 g/dL — ABNORMAL LOW (ref 32.0–36.0)
MCV: 82.7 fL (ref 80.0–100.0)
MPV: 9.3 fL (ref 7.5–12.5)
Monocytes Absolute: 580 cells/uL (ref 200–950)
Monocytes Relative: 10 %
Neutro Abs: 3074 cells/uL (ref 1500–7800)
Neutrophils Relative %: 53 %
Platelets: 248 10*3/uL (ref 140–400)
RBC: 4.39 MIL/uL (ref 3.80–5.10)
RDW: 14.6 % (ref 11.0–15.0)
WBC: 5.8 10*3/uL (ref 3.8–10.8)

## 2016-07-25 LAB — TSH: TSH: 0.32 mIU/L — ABNORMAL LOW

## 2016-07-25 MED ORDER — DOXYCYCLINE HYCLATE 100 MG PO TABS: 100.0000 mg | ORAL_TABLET | Freq: Two times a day (BID) | ORAL | 0 refills | Status: DC

## 2016-07-25 NOTE — Progress Notes (Signed)
Patient ID: [name], female   DOB: 1926-12-22, 82 y.o.   MRN: [id number]    Location:  [facility] Place of Service: OFFICE  Chief Complaint  Patient presents with  . Acute Visit    Left leg pain red with pus from previous accident     HPI:  82 yo female seen today for nonhealing LLE wound. She sustained a laceration 06/30/16 and went to the [place] had sutures placed. Sutures removed about 1 week later by Dr Mariea Clonts. She was seen by [name] 12/4th and was toldtolet area get air when at [facility] cover it when leave home. She has applied polysporin topicalabx BID. She reports increased redness,swelling and TTP over the last severaldays. Last Tdap 02/2014. No f/c.she has noticed drainage inbed sheets.  Allergy to PCN (generalized rash)  Past Medical History:  Diagnosis Date  . Arthritis    Osteoarthritis  . Depression   . GERD (gastroesophageal reflux disease)   . Hyperlipidemia   . Osteopenia     Past Surgical History:  Procedure Laterality Date  . APPENDECTOMY  1936  . CERVICAL FUSION  2004  . COLON SURGERY  06/12/2010   Colonoscopy  . FINGER SURGERY  1989  . SPINE SURGERY  2009   Back Fusion  . TONSILLECTOMY  1941    Patient Care Team: Gayland Curry, DO as PCP - General (Geriatric Medicine) Warden Fillers, MD as Consulting Physician (Ophthalmology)  Social History   Social History  . Marital status: Divorced    Spouse name: N/A  . Number of children: N/A  . Years of education: N/A   Occupational History  . Not on file.   Social History Main Topics  . Smoking status: Former Research scientist (life sciences)  . Smokeless tobacco: Never Used  . Alcohol use No  . Drug use: No  . Sexual activity: No   Other Topics Concern  . Not on file   Social History Narrative  . No narrative on file     reports that she has quit smoking. She has never used smokeless tobacco. She reports that she does not drink alcohol or use drugs.  History reviewed. No pertinent family history. Family Status    Relation Status  . Mother Deceased at age 75   Cause of Death: Unknown  . Father Deceased   Cause of Death: Unknown  . Son Alive  . Son Alive     Allergies  Allergen Reactions  . Penicillins Rash    Medications: Patient's Medications  New Prescriptions   No medications on file  Previous Medications   ALENDRONATE (FOSAMAX) 70 MG TABLET    Take one tablet by mouth weekly.  Stay upright for 1 hour after taking.   ASPIRIN 81 MG TABLET    Take 81 mg by mouth daily.   BRIMONIDINE (ALPHAGAN) 0.15 % OPHTHALMIC SOLUTION    INT 1 GTT INTO OU BID   CHOLECALCIFEROL (VITAMIN D) 1000 UNITS TABLET    Take 1,000 Units by mouth 2 (two) times daily.    DORZOLAMIDE-TIMOLOL (COSOPT) 22.3-6.8 MG/ML OPHTHALMIC SOLUTION    Place 1 drop into both eyes daily.   FLUOXETINE (PROZAC) 20 MG TABLET    TAKE 1 BY MOUTH DAILY   GABAPENTIN (NEURONTIN) 300 MG CAPSULE       LATANOPROST (XALATAN) 0.005 % OPHTHALMIC SOLUTION    Place 1 drop into both eyes at bedtime.    LISINOPRIL-HYDROCHLOROTHIAZIDE (PRINZIDE,ZESTORETIC) 20-12.5 MG TABLET    Take one tablet by mouth once daily for blood  pressure   MIRABEGRON ER (MYRBETRIQ) 25 MG TB24 TABLET    Take 1 tablet (25 mg total) by mouth daily.   OXYCODONE-ACETAMINOPHEN (PERCOCET/ROXICET) 5-325 MG PER TABLET    Take 1 tablet by mouth 3 (three) times daily as needed for pain.    PANTOPRAZOLE (PROTONIX) 40 MG TABLET    Take one tablet by mouth once daily for acid reflux   PREDNISONE (DELTASONE) 1 MG TABLET    3 tablets Once a day Orally 90 days   PROBIOTIC PRODUCT (ALIGN PO)    Take by mouth daily.   SIMVASTATIN (ZOCOR) 20 MG TABLET    Take one tablet by mouth once daily for choelsterol   TRIAMCINOLONE CREAM (KENALOG) 0.1 %    1 application apply on the skin twice a day  Modified Medications   No medications on file  Discontinued Medications   No medications on file    Review of Systems  Cardiovascular: Negative for leg swelling.  Musculoskeletal: Positive for joint  swelling.  Skin: Positive for wound.  All other systems reviewed and are negative.   Vitals:   07/25/16 1123  BP: (!) 142/70  Pulse: 78  Temp: 97.4 F (36.3 C)  TempSrc: Oral  SpO2: 93%  Weight: 140 lb 6.4 oz (63.7 kg)  Height: 5' (1.524 m)   Body mass index is 27.42 kg/m.  Physical Exam  Constitutional: She is oriented to person, place, and time. She appears well-developed and well-nourished.  Cardiovascular:  Pulses:      Dorsalis pedis pulses are 2+ on the right side, and 2+ on the left side.       Posterior tibial pulses are 2+ on the right side, and 2+ on the left side.  No LE edema b/l. No calf TTP. No palpable cords  Neurological: She is alert and oriented to person, place, and time.  Skin: Skin is warm and dry. No rash noted.     Psychiatric: She has a normal mood and affect. Her behavior is normal. Judgment and thought content normal.     Labs reviewed: Orders Only on 05/01/2016  Component Date Value Ref Range Status  . Vitamin B-12 05/01/2016 955  200 - 1,100 pg/mL Final  . Vit D, 25-Hydroxy 05/02/2016 67  30 - 100 ng/mL Final   Comment: Vitamin D Status           25-OH Vitamin D        Deficiency                <20 ng/mL        Insufficiency         20 - 29 ng/mL        Optimal             > or = 30 ng/mL   For 25-OH Vitamin D testing on patients on D2-supplementation and patients for whom quantitation of D2 and D3 fractions is required, the QuestAssureD 25-OH VIT D, (D2,D3), LC/MS/MS is recommended: order code [phone] (patients > 2 yrs).   Office Visit on 05/01/2016  Component Date Value Ref Range Status  . Colony Count 05/02/2016 Greater than 100,000 CFU/mL   Final  . Organism ID, Bacteria 05/02/2016 VIRIDANS STREPTOCOCCUS   Final  . Color, UA 05/01/2016 yellow   Final  . Clarity, UA 05/01/2016 cloudy   Final  . Glucose, UA 05/01/2016 neg   Final  . Bilirubin, UA 05/01/2016 mod ++   Final  . Ketones, UA 05/01/2016 neg  Final  . Spec Grav, UA  05/01/2016 <=1.005   Final  . Blood, UA 05/01/2016 neg   Final  . pH, UA 05/01/2016 8.0   Final  . Protein, UA 05/01/2016 neg   Final  . Urobilinogen, UA 05/01/2016 negative   Final  . Nitrite, UA 05/01/2016 neg   Final  . Leukocytes, UA 05/01/2016 Negative  Negative Final    Dg Tibia/fibula Left  Result Date: 06/30/2016 CLINICAL DATA:  Soft tissue injury after rubbing against a coffee table this morning. Patient has a laceration over the lateral aspect of the leg just below the knee joint extending inferiorly to the mid calf. EXAM: LEFT TIBIA AND FIBULA - 2 VIEW COMPARISON:  None in PACs FINDINGS: The bones are subjectively adequately mineralized. There is no acute fracture. The observed portions of the knee and ankle exhibit no acute abnormalities. There is no soft tissue foreign body nor other acute soft tissue abnormality. IMPRESSION: There is no acute bony abnormality of the left tibia or fibula. Electronically Signed   By: David  Martinique M.D.   On: 06/30/2016 12:50     Assessment/Plan   ICD-9-CM ICD-10-CM   1. Infected laceration 879.9 T14.8XXA doxycycline (VIBRA-TABS) 100 MG tablet    L08.9    left leg    Clean with warm dial antibacterial soapy water at least daily  Continue applying topical polysporin  Start doxycycline 100mg  2 times daily x 10 days  Take probiotic daily while on antibiotic  May leave wound uncovered while at home but cover when leaving home  Follow up with Dr Mariea Clonts as scheduled next week for [employer]. Perlie Gold  [facility] [address] [place], [place] [address] [phone] Cell (Monday-Friday 8 AM - 5 PM) [phone] After 5 PM and follow prompts

## 2016-07-25 NOTE — Patient Instructions (Signed)
Clean with warm dial antibacterial soapy water at least daily  Continue applying topical polysporin  Start doxycycline 100mg  2 times daily x 10 days  Take probiotic daily while on antibiotic  May leave wound uncovered while at home but cover when leaving home  Follow up with Dr Mariea Clonts as scheduled

## 2016-07-26 LAB — COMPLETE METABOLIC PANEL WITH GFR
ALT: 12 U/L (ref 6–29)
AST: 15 U/L (ref 10–35)
Albumin: 4.1 g/dL (ref 3.6–5.1)
Alkaline Phosphatase: 41 U/L (ref 33–130)
BUN: 17 mg/dL (ref 7–25)
CO2: 29 mmol/L (ref 20–31)
Calcium: 9.8 mg/dL (ref 8.6–10.4)
Chloride: 103 mmol/L (ref 98–110)
Creat: 0.71 mg/dL (ref 0.60–0.88)
GFR, Est African American: 89 mL/min (ref >=60)
GFR, Est Non African American: 77 mL/min (ref >=60)
Glucose, Bld: 94 mg/dL (ref 65–99)
Potassium: 4.3 mmol/L (ref 3.5–5.3)
Sodium: 142 mmol/L (ref 135–146)
Total Bilirubin: 0.4 mg/dL (ref 0.2–1.2)
Total Protein: 6.4 g/dL (ref 6.1–8.1)

## 2016-07-26 LAB — HEMOGLOBIN A1C
Hgb A1c MFr Bld: 5.7 % — ABNORMAL HIGH (ref <5.7)
Mean Plasma Glucose: 117 mg/dL

## 2016-07-26 LAB — LIPID PANEL
Cholesterol: 199 mg/dL (ref <200)
HDL: 87 mg/dL (ref >50)
LDL Cholesterol: 85 mg/dL (ref <100)
Total CHOL/HDL Ratio: 2.3 Ratio (ref <5.0)
Triglycerides: 137 mg/dL (ref <150)
VLDL: 27 mg/dL (ref <30)

## 2016-07-28 ENCOUNTER — Encounter: Payer: Self-pay | Admitting: *Deleted

## 2016-07-28 LAB — T4, FREE: Free T4: 1.4 ng/dL (ref 0.8–1.8)

## 2016-07-28 LAB — T3, FREE: T3, Free: 3.2 pg/mL (ref 2.3–4.2)

## 2016-07-31 ENCOUNTER — Encounter: Payer: Self-pay | Admitting: Internal Medicine

## 2016-07-31 ENCOUNTER — Ambulatory Visit (INDEPENDENT_AMBULATORY_CARE_PROVIDER_SITE_OTHER): Payer: Medicare Other | Admitting: Internal Medicine

## 2016-07-31 VITALS — BP 130/70 | HR 84 | Temp 98.0°F | Wt 137.0 lb

## 2016-07-31 DIAGNOSIS — T148XXA Other injury of unspecified body region, initial encounter: Secondary | ICD-10-CM | POA: Diagnosis not present

## 2016-07-31 DIAGNOSIS — H409 Unspecified glaucoma: Secondary | ICD-10-CM | POA: Diagnosis not present

## 2016-07-31 DIAGNOSIS — L089 Local infection of the skin and subcutaneous tissue, unspecified: Secondary | ICD-10-CM | POA: Diagnosis not present

## 2016-07-31 DIAGNOSIS — N3281 Overactive bladder: Secondary | ICD-10-CM | POA: Diagnosis not present

## 2016-07-31 DIAGNOSIS — M0609 Rheumatoid arthritis without rheumatoid factor, multiple sites: Secondary | ICD-10-CM

## 2016-07-31 DIAGNOSIS — I872 Venous insufficiency (chronic) (peripheral): Secondary | ICD-10-CM

## 2016-07-31 DIAGNOSIS — D509 Iron deficiency anemia, unspecified: Secondary | ICD-10-CM

## 2016-07-31 NOTE — Progress Notes (Signed)
Location:  [facility] clinic Provider:  Evanell Redlich L. Mariea Clonts, D.O., C.M.D.  Code Status: DNR Goals of Care:  Advanced Directives 07/25/2016  Does Patient Have a Medical Advance Directive? Yes  Type of Advance Directive [place]  Does patient want to make changes to medical advance directive? -  Copy of [place] in Chart? Yes     Chief Complaint  Patient presents with  . Medical Management of Chronic Issues    13mth follow-up    HPI: Patient is a 82 y.o. female seen today for medical management of chronic diseases.    Cannot take anticholinergic meds due to her glaucoma.  Myrbetriq ineffective.  Still getting up at night and going frequently during the day, too.  She does not want to see urology.  We had checked her urine and it was negative for infection.    Left leg wound healing.  Infection improving.    No dizziness.    Fatigue:  Did have new anemia on labs.  We are going to treat with a prenatal vitamin.  TSH was low, but free T3 and T4 normal.    Past Medical History:  Diagnosis Date  . Arthritis    Osteoarthritis  . Depression   . GERD (gastroesophageal reflux disease)   . Hyperlipidemia   . Osteopenia     Past Surgical History:  Procedure Laterality Date  . APPENDECTOMY  1936  . CERVICAL FUSION  2004  . COLON SURGERY  06/12/2010   Colonoscopy  . FINGER SURGERY  1989  . SPINE SURGERY  2009   Back Fusion  . TONSILLECTOMY  1941    Allergies  Allergen Reactions  . Penicillins Rash      Medication List       Accurate as of 07/31/16  1:46 PM. Always use your most recent med list.          alendronate 70 MG tablet Commonly known as:  FOSAMAX Take one tablet by mouth weekly.  Stay upright for 1 hour after taking.   ALIGN PO Take by mouth daily.   aspirin 81 MG tablet Take 81 mg by mouth daily.   brimonidine 0.15 % ophthalmic solution Commonly known as:  ALPHAGAN INT 1 GTT INTO OU BID   cholecalciferol 1000  units tablet Commonly known as:  VITAMIN D Take 1,000 Units by mouth 2 (two) times daily.   dorzolamide-timolol 22.3-6.8 MG/ML ophthalmic solution Commonly known as:  COSOPT Place 1 drop into both eyes daily.   doxycycline 100 MG tablet Commonly known as:  VIBRA-TABS Take 1 tablet (100 mg total) by mouth 2 (two) times daily.   FLUoxetine 20 MG tablet Commonly known as:  PROZAC TAKE 1 BY MOUTH DAILY   gabapentin 300 MG capsule Commonly known as:  NEURONTIN   latanoprost 0.005 % ophthalmic solution Commonly known as:  XALATAN Place 1 drop into both eyes at bedtime.   lisinopril-hydrochlorothiazide 20-12.5 MG tablet Commonly known as:  PRINZIDE,ZESTORETIC Take one tablet by mouth once daily for blood pressure   mirabegron ER 25 MG Tb24 tablet Commonly known as:  MYRBETRIQ Take 1 tablet (25 mg total) by mouth daily.   oxyCODONE-acetaminophen 5-325 MG tablet Commonly known as:  PERCOCET/ROXICET Take 1 tablet by mouth 3 (three) times daily as needed for pain.   pantoprazole 40 MG tablet Commonly known as:  PROTONIX Take one tablet by mouth once daily for acid reflux   predniSONE 1 MG tablet Commonly known as:  DELTASONE  3 tablets Once a day Orally 90 days   simvastatin 20 MG tablet Commonly known as:  ZOCOR Take one tablet by mouth once daily for choelsterol   triamcinolone cream 0.1 % Commonly known as:  KENALOG 1 application apply on the skin twice a day       Review of Systems:  Review of Systems  Constitutional: Negative for chills and fever.  HENT: Negative for hearing loss.   Eyes: Negative for blurred vision.       Glaucoma  Respiratory: Negative for shortness of breath.   Cardiovascular: Negative for chest pain, palpitations and leg swelling.  Gastrointestinal: Negative for abdominal pain, blood in stool, constipation and melena.  Genitourinary: Positive for frequency and urgency. Negative for dysuria, flank pain and hematuria.  Musculoskeletal:  Positive for joint pain. Negative for falls.  Neurological: Negative for dizziness and loss of consciousness.  Psychiatric/Behavioral: Negative for depression and memory loss.    Health Maintenance  Topic Date Due  . TETANUS/TDAP  03/03/2024  . INFLUENZA VACCINE  Completed  . DEXA SCAN  Completed  . ZOSTAVAX  Completed  . PNA vac Low Risk Adult  Completed    Physical Exam: Vitals:   07/31/16 1324  BP: 130/70  Pulse: 84  Temp: 98 F (36.7 C)  TempSrc: Oral  SpO2: 94%  Weight: 137 lb (62.1 kg)   Body mass index is 26.76 kg/m. Physical Exam  Constitutional: She is oriented to person, place, and time. She appears well-developed and well-nourished. No distress.  Cardiovascular: Normal rate, regular rhythm, normal heart sounds and intact distal pulses.   Pulmonary/Chest: Effort normal and breath sounds normal. No respiratory distress.  Abdominal: Soft. Bowel sounds are normal. She exhibits no distension.  Musculoskeletal: Normal range of motion.  Neurological: She is alert and oriented to person, place, and time.  Skin: Skin is warm and dry.  Left shin wound now with some dampness of upper aspect, but no purulent drainage at present; lower part is healing better, still mild surrounding erythema and slight tenderness, but improved from last eval description    Labs reviewed: Basic Metabolic Panel:  Recent Labs  01/21/16 1011 07/25/16 1202  NA 143 142  K 4.1 4.3  CL 100 103  CO2 27 29  GLUCOSE 97 94  BUN 16 17  CREATININE 0.70 0.71  CALCIUM 9.4 9.8  TSH  --  0.32*   Liver Function Tests:  Recent Labs  01/21/16 1011 07/25/16 1202  AST 13 15  ALT 14 12  ALKPHOS 51 41  BILITOT 0.2 0.4  PROT 5.8* 6.4  ALBUMIN 4.0 4.1   No results for input(s): LIPASE, AMYLASE in the last 8760 hours. No results for input(s): AMMONIA in the last 8760 hours. CBC:  Recent Labs  01/21/16 1011 07/25/16 1202  WBC 6.5 5.8  NEUTROABS 3.6 3,074  HGB  --  11.5*  HCT 37.8 36.3    MCV 85 82.7  PLT 269 248   Lipid Panel:  Recent Labs  01/21/16 1011 07/25/16 1202  CHOL 203* 199  HDL 69 87  LDLCALC 87 85  TRIG 234* 137  CHOLHDL 2.9 2.3   Lab Results  Component Value Date   HGBA1C 5.7 (H) 07/25/2016     Assessment/Plan 1. Infected laceration - is healing now, finish abx, cont to cover when out until wound dried up, then leave open to air  - CBC with Differential/Platelet; Future  2. OAB (overactive bladder) -did not benefit from myrbetriq, but cannot take  anticholinergics like vesicare and older drugs due to her glaucoma so will manage conservatively with adult undergarments as she does not want to see urology -urinalysis was unremarkable previously  3. Chronic venous insufficiency - no edema at present, elevate feet at rest - COMPLETE METABOLIC PANEL WITH GFR; Future  4. Iron deficiency anemia, unspecified iron deficiency anemia type - new onset--has been progressive -advised to take prenatal vitamin daily for iron supplementation - CBC with Differential/Platelet; Future  5. Rheumatoid arthritis of multiple sites with negative rheumatoid factor ([place]) -is on chronic steroid therapy for this--pt reports she has OA, but that is not treated with steroids   6. Glaucoma of both eyes, unspecified glaucoma type -ongoing, cont current drops and ophtho therapy  Labs/tests ordered:   Orders Placed This Encounter  Procedures  . CBC with Differential/Platelet    Standing Status:   Future    Standing Expiration Date:   03/31/2017  . COMPLETE METABOLIC PANEL WITH GFR    [facility]    Standing Status:   Future    Standing Expiration Date:   03/31/2017   Next appt:  12/05/2016  Benny Henrie L. Rosely Fernandez, D.O. [facility] [address], [place] [address] Cell Phone (Mon-Fri 8am-5pm):  [phone] On Call:  [phone] & follow prompts after 5pm & weekends Office Phone:  [phone] Office Fax:   [phone]

## 2016-08-25 ENCOUNTER — Other Ambulatory Visit: Payer: Self-pay | Admitting: *Deleted

## 2016-08-25 MED ORDER — LISINOPRIL-HYDROCHLOROTHIAZIDE 20-12.5 MG PO TABS: ORAL_TABLET | ORAL | 1 refills | Status: DC

## 2016-08-25 MED ORDER — FLUOXETINE HCL 20 MG PO TABS: ORAL_TABLET | ORAL | 1 refills | Status: DC

## 2016-08-25 NOTE — Telephone Encounter (Signed)
[facility] 

## 2016-09-08 DIAGNOSIS — H43813 Vitreous degeneration, bilateral: Secondary | ICD-10-CM | POA: Diagnosis not present

## 2016-09-08 DIAGNOSIS — H401132 Primary open-angle glaucoma, bilateral, moderate stage: Secondary | ICD-10-CM | POA: Diagnosis not present

## 2016-09-08 DIAGNOSIS — Z961 Presence of intraocular lens: Secondary | ICD-10-CM | POA: Diagnosis not present

## 2016-09-24 DIAGNOSIS — M1990 Unspecified osteoarthritis, unspecified site: Secondary | ICD-10-CM | POA: Diagnosis not present

## 2016-09-24 DIAGNOSIS — M5416 Radiculopathy, lumbar region: Secondary | ICD-10-CM | POA: Diagnosis not present

## 2016-09-24 DIAGNOSIS — M150 Primary generalized (osteo)arthritis: Secondary | ICD-10-CM | POA: Diagnosis not present

## 2016-09-24 DIAGNOSIS — M797 Fibromyalgia: Secondary | ICD-10-CM | POA: Diagnosis not present

## 2016-10-13 ENCOUNTER — Other Ambulatory Visit: Payer: Self-pay | Admitting: *Deleted

## 2016-10-13 MED ORDER — ALENDRONATE SODIUM 70 MG PO TABS: ORAL_TABLET | ORAL | 3 refills | Status: DC

## 2016-10-13 NOTE — Telephone Encounter (Signed)
[facility] 

## 2016-10-16 DIAGNOSIS — D225 Melanocytic nevi of trunk: Secondary | ICD-10-CM | POA: Diagnosis not present

## 2016-10-16 DIAGNOSIS — L814 Other melanin hyperpigmentation: Secondary | ICD-10-CM | POA: Diagnosis not present

## 2016-10-16 DIAGNOSIS — D0439 Carcinoma in situ of skin of other parts of face: Secondary | ICD-10-CM | POA: Diagnosis not present

## 2016-10-16 DIAGNOSIS — L821 Other seborrheic keratosis: Secondary | ICD-10-CM | POA: Diagnosis not present

## 2016-10-16 DIAGNOSIS — D044 Carcinoma in situ of skin of scalp and neck: Secondary | ICD-10-CM | POA: Diagnosis not present

## 2016-10-16 DIAGNOSIS — Z85828 Personal history of other malignant neoplasm of skin: Secondary | ICD-10-CM | POA: Diagnosis not present

## 2016-11-03 DIAGNOSIS — D044 Carcinoma in situ of skin of scalp and neck: Secondary | ICD-10-CM | POA: Diagnosis not present

## 2016-11-03 DIAGNOSIS — Z85828 Personal history of other malignant neoplasm of skin: Secondary | ICD-10-CM | POA: Diagnosis not present

## 2016-11-03 DIAGNOSIS — D0439 Carcinoma in situ of skin of other parts of face: Secondary | ICD-10-CM | POA: Diagnosis not present

## 2016-11-20 ENCOUNTER — Other Ambulatory Visit: Payer: Self-pay | Admitting: Internal Medicine

## 2016-11-20 DIAGNOSIS — I10 Essential (primary) hypertension: Secondary | ICD-10-CM

## 2016-11-20 DIAGNOSIS — E781 Pure hyperglyceridemia: Secondary | ICD-10-CM

## 2016-11-20 DIAGNOSIS — I872 Venous insufficiency (chronic) (peripheral): Secondary | ICD-10-CM

## 2016-12-02 ENCOUNTER — Other Ambulatory Visit: Payer: Medicare Other

## 2016-12-02 DIAGNOSIS — I872 Venous insufficiency (chronic) (peripheral): Secondary | ICD-10-CM

## 2016-12-02 DIAGNOSIS — E781 Pure hyperglyceridemia: Secondary | ICD-10-CM | POA: Diagnosis not present

## 2016-12-02 DIAGNOSIS — I10 Essential (primary) hypertension: Secondary | ICD-10-CM

## 2016-12-02 LAB — CBC WITH DIFFERENTIAL/PLATELET
Basophils Absolute: 59 cells/uL (ref 0–200)
Basophils Relative: 1 %
Eosinophils Absolute: 177 cells/uL (ref 15–500)
Eosinophils Relative: 3 %
HCT: 37.4 % (ref 35.0–45.0)
Hemoglobin: 12.1 g/dL (ref 11.7–15.5)
Lymphocytes Relative: 37 %
Lymphs Abs: 2183 cells/uL (ref 850–3900)
MCH: 27.6 pg (ref 27.0–33.0)
MCHC: 32.4 g/dL (ref 32.0–36.0)
MCV: 85.2 fL (ref 80.0–100.0)
MPV: 9.7 fL (ref 7.5–12.5)
Monocytes Absolute: 472 cells/uL (ref 200–950)
Monocytes Relative: 8 %
Neutro Abs: 3009 cells/uL (ref 1500–7800)
Neutrophils Relative %: 51 %
Platelets: 242 10*3/uL (ref 140–400)
RBC: 4.39 MIL/uL (ref 3.80–5.10)
RDW: 15.6 % — ABNORMAL HIGH (ref 11.0–15.0)
WBC: 5.9 10*3/uL (ref 3.8–10.8)

## 2016-12-02 LAB — COMPLETE METABOLIC PANEL WITH GFR
ALT: 15 U/L (ref 6–29)
AST: 17 U/L (ref 10–35)
Albumin: 4 g/dL (ref 3.6–5.1)
Alkaline Phosphatase: 43 U/L (ref 33–130)
BUN: 19 mg/dL (ref 7–25)
CO2: 24 mmol/L (ref 20–31)
Calcium: 9.8 mg/dL (ref 8.6–10.4)
Chloride: 104 mmol/L (ref 98–110)
Creat: 0.8 mg/dL (ref 0.60–0.88)
GFR, Est African American: 77 mL/min (ref >=60)
GFR, Est Non African American: 67 mL/min (ref >=60)
Glucose, Bld: 97 mg/dL (ref 65–99)
Potassium: 4.2 mmol/L (ref 3.5–5.3)
Sodium: 141 mmol/L (ref 135–146)
Total Bilirubin: 0.3 mg/dL (ref 0.2–1.2)
Total Protein: 6.3 g/dL (ref 6.1–8.1)

## 2016-12-05 ENCOUNTER — Ambulatory Visit (INDEPENDENT_AMBULATORY_CARE_PROVIDER_SITE_OTHER): Payer: Medicare Other | Admitting: Internal Medicine

## 2016-12-05 ENCOUNTER — Encounter: Payer: Self-pay | Admitting: Internal Medicine

## 2016-12-05 VITALS — BP 120/70 | HR 80 | Ht 60.0 in | Wt 138.0 lb

## 2016-12-05 DIAGNOSIS — D509 Iron deficiency anemia, unspecified: Secondary | ICD-10-CM | POA: Diagnosis not present

## 2016-12-05 DIAGNOSIS — I10 Essential (primary) hypertension: Secondary | ICD-10-CM

## 2016-12-05 DIAGNOSIS — M0609 Rheumatoid arthritis without rheumatoid factor, multiple sites: Secondary | ICD-10-CM | POA: Diagnosis not present

## 2016-12-05 DIAGNOSIS — F3341 Major depressive disorder, recurrent, in partial remission: Secondary | ICD-10-CM | POA: Diagnosis not present

## 2016-12-05 DIAGNOSIS — M0550 Rheumatoid polyneuropathy with rheumatoid arthritis of unspecified site: Secondary | ICD-10-CM

## 2016-12-05 DIAGNOSIS — I872 Venous insufficiency (chronic) (peripheral): Secondary | ICD-10-CM | POA: Diagnosis not present

## 2016-12-05 DIAGNOSIS — E781 Pure hyperglyceridemia: Secondary | ICD-10-CM

## 2016-12-05 DIAGNOSIS — N3281 Overactive bladder: Secondary | ICD-10-CM | POA: Diagnosis not present

## 2016-12-05 NOTE — Progress Notes (Signed)
Location:  [facility] clinic Provider:  Jo-Ann Johanning L. Mariea Clonts, D.O., C.M.D.  Code Status: DNR Goals of Care:  Advanced Directives 12/05/2016  Does Patient Have a Medical Advance Directive? No  Type of Advance Directive -  Does patient want to make changes to medical advance directive? -  Copy of [place] in Chart? -  Would patient like information on creating a medical advance directive? No - Patient declined   Chief Complaint  Patient presents with  . Medical Management of Chronic Issues    32mth follow-up    HPI: Patient is a 83 y.o. female seen today for medical management of chronic diseases.  Nothing new.  Leg finally healed up.  Remains chronically fatigued.    Just had three skin cancers removed from her face.    HTN:  bp good.    Peripheral neuropathy:  About the same.  Using the gabapentin 300mg .  Had accupuncture several months back, but it didn't really help after 4-5 sessions.    RA:  Same old pains in hands and feet.  Using percocet 2-3 per day.  Tried taking only one, but not enough.  Remains on low dose prednisone also.  Went to a pain specialist at one point.    Urge incontinence: ok in daytime, but at night, she has to wear depends.  Uses a pad to be safe in days.    Depression:  Continues on prozac.  Says she is not as happy as she used to be.  Could be worse.  She's been on the prozac for years even before she came here.  Thinks she was on cymbalta at one time from pain mgt.  Hypertriglyceridemia:  TG at 137 lately.    She is taking the prenatal vitamins for iron.  gelcap was good, but other pill was hard and large.    Past Medical History:  Diagnosis Date  . Arthritis    Osteoarthritis  . Depression   . GERD (gastroesophageal reflux disease)   . Hyperlipidemia   . Osteopenia     Past Surgical History:  Procedure Laterality Date  . APPENDECTOMY  1936  . CERVICAL FUSION  2004  . COLON SURGERY  06/12/2010   Colonoscopy  . FINGER SURGERY   1989  . SPINE SURGERY  2009   Back Fusion  . TONSILLECTOMY  1941    Allergies  Allergen Reactions  . Penicillins Rash    Allergies as of 12/05/2016      Reactions   Penicillins Rash      Medication List       Accurate as of 12/05/16 10:09 AM. Always use your most recent med list.          alendronate 70 MG tablet Commonly known as:  FOSAMAX Take one tablet by mouth weekly.  Stay upright for 1 hour after taking.   ALIGN PO Take by mouth daily.   aspirin 81 MG tablet Take 81 mg by mouth daily.   cholecalciferol 1000 units tablet Commonly known as:  VITAMIN D Take 1,000 Units by mouth 2 (two) times daily.   dorzolamide-timolol 22.3-6.8 MG/ML ophthalmic solution Commonly known as:  COSOPT Place 1 drop into both eyes daily.   FLUoxetine 20 MG tablet Commonly known as:  PROZAC Take one tablet by mouth once daily   gabapentin 300 MG capsule Commonly known as:  NEURONTIN   latanoprost 0.005 % ophthalmic solution Commonly known as:  XALATAN Place 1 drop into both eyes at bedtime.  lisinopril-hydrochlorothiazide 20-12.5 MG tablet Commonly known as:  PRINZIDE,ZESTORETIC Take one tablet by mouth once daily for blood pressure   oxyCODONE-acetaminophen 5-325 MG tablet Commonly known as:  PERCOCET/ROXICET Take 1 tablet by mouth 3 (three) times daily as needed for pain.   pantoprazole 40 MG tablet Commonly known as:  PROTONIX Take one tablet by mouth once daily for acid reflux   predniSONE 1 MG tablet Commonly known as:  DELTASONE 3 tablets Once a day Orally 90 days   simvastatin 20 MG tablet Commonly known as:  ZOCOR Take one tablet by mouth once daily for choelsterol   triamcinolone cream 0.1 % Commonly known as:  KENALOG 1 application apply on the skin twice a day       Review of Systems:  Review of Systems  Constitutional: Positive for malaise/fatigue. Negative for chills and fever.  HENT: Positive for hearing loss. Negative for congestion.     Eyes: Negative for blurred vision.  Respiratory: Negative for shortness of breath.   Cardiovascular: Negative for chest pain and palpitations.  Gastrointestinal: Negative for abdominal pain, blood in stool, constipation and melena.  Genitourinary: Negative for dysuria.  Musculoskeletal: Positive for joint pain. Negative for falls and myalgias.  Skin: Negative for itching and rash.  Neurological: Negative for dizziness, loss of consciousness and weakness.  Endo/Heme/Allergies: Bruises/bleeds easily.  Psychiatric/Behavioral: Positive for depression. Negative for memory loss. The patient does not have insomnia.     Health Maintenance  Topic Date Due  . INFLUENZA VACCINE  03/18/2017  . TETANUS/TDAP  03/03/2024  . DEXA SCAN  Completed  . PNA vac Low Risk Adult  Completed    Physical Exam: Vitals:   12/05/16 1001  BP: 120/70  Pulse: 80  SpO2: 96%  Weight: 138 lb (62.6 kg)  Height: 5' (1.524 m)   Body mass index is 26.95 kg/m. Physical Exam  Constitutional: She is oriented to person, place, and time. She appears well-nourished. No distress.  Cardiovascular: Normal rate, regular rhythm, normal heart sounds and intact distal pulses.   Pulmonary/Chest: Effort normal and breath sounds normal. No respiratory distress.  Abdominal: Bowel sounds are normal.  Musculoskeletal:  Walking more stiff-legged than usual, using cane to walk  Neurological: She is alert and oriented to person, place, and time.  Skin: Skin is warm and dry. Capillary refill takes less than 2 seconds.  Psychiatric: She has a normal mood and affect.    Labs reviewed: Basic Metabolic Panel:  Recent Labs  01/21/16 1011 07/25/16 1202 12/02/16 1012  NA 143 142 141  K 4.1 4.3 4.2  CL 100 103 104  CO2 27 29 24   GLUCOSE 97 94 97  BUN 16 17 19   CREATININE 0.70 0.71 0.80  CALCIUM 9.4 9.8 9.8  TSH  --  0.32*  --    Liver Function Tests:  Recent Labs  01/21/16 1011 07/25/16 1202 12/02/16 1012  AST 13 15  17   ALT 14 12 15   ALKPHOS 51 41 43  BILITOT 0.2 0.4 0.3  PROT 5.8* 6.4 6.3  ALBUMIN 4.0 4.1 4.0   No results for input(s): LIPASE, AMYLASE in the last 8760 hours. No results for input(s): AMMONIA in the last 8760 hours. CBC:  Recent Labs  01/21/16 1011 07/25/16 1202 12/02/16 1012  WBC 6.5 5.8 5.9  NEUTROABS 3.6 3,074 3,009  HGB  --  11.5* 12.1  HCT 37.8 36.3 37.4  MCV 85 82.7 85.2  PLT 269 248 242   Lipid Panel:  Recent Labs  01/21/16 1011 07/25/16 1202  CHOL 203* 199  HDL 69 87  LDLCALC 87 85  TRIG 234* 137  CHOLHDL 2.9 2.3   Lab Results  Component Value Date   HGBA1C 5.7 (H) 07/25/2016    Assessment/Plan 1. OAB (overactive bladder) -did not benefit from myrbetriq, mostly occurs at hs  2. Chronic venous insufficiency -better this visit  3. Iron deficiency anemia, unspecified iron deficiency anemia type -restart the prenatal vitamins in hopes this will giver her more energy, anemia did improve after only three weeks, needs smaller capsule variation b/c she could not swallow the ones her daughter got her  4. Rheumatoid arthritis of multiple sites with negative rheumatoid factor (HCC) -cont prednisone and percocet  5. Hypertriglyceridemia, essential -on zocor, cont this and monitor  6. 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 Essential hypertension, benign -bp at goal with current therapy, no changes needed  8. Recurrent major depressive disorder, in partial remission (HCC) -cont prozac, pt has difficulty affording new agents and says she thinks she was on cymbalta at one time w/o benefit  Labs/tests ordered:  No orders of the defined types were placed in this encounter.   Next appt:  04/09/2017   Maisen Klingler L. Jeroline Wolbert, D.O. [facility] [address], [place] [address] Cell Phone (Mon-Fri 8am-5pm):  [phone] On Call:   [phone] & follow prompts after 5pm & weekends Office Phone:  [phone] Office Fax:  [phone]

## 2016-12-05 NOTE — Patient Instructions (Signed)
Get a different prenatal multivitamin that you can easily swallow.  The pharmacist can help you find one so you can keep your iron up.

## 2016-12-24 DIAGNOSIS — M1990 Unspecified osteoarthritis, unspecified site: Secondary | ICD-10-CM | POA: Diagnosis not present

## 2016-12-24 DIAGNOSIS — M5416 Radiculopathy, lumbar region: Secondary | ICD-10-CM | POA: Diagnosis not present

## 2016-12-24 DIAGNOSIS — M150 Primary generalized (osteo)arthritis: Secondary | ICD-10-CM | POA: Diagnosis not present

## 2016-12-24 DIAGNOSIS — M797 Fibromyalgia: Secondary | ICD-10-CM | POA: Diagnosis not present

## 2017-01-09 ENCOUNTER — Telehealth: Payer: Self-pay | Admitting: *Deleted

## 2017-01-09 MED ORDER — MECLIZINE HCL 12.5 MG PO TABS: 12.5000 mg | ORAL_TABLET | Freq: Three times a day (TID) | ORAL | 0 refills | Status: DC | PRN

## 2017-01-09 NOTE — Telephone Encounter (Signed)
Patient notified and agreed.  

## 2017-01-09 NOTE — Telephone Encounter (Signed)
Patient called and stated that she has been having bouts with Dizziness since Monday. Stated that she has no Headache nor Earache. Fell Tuesday on her bottom, no injury. The dizziness is not that bad but it throws her off balance. Wants to know if there is something she can take for this. Please Advise.

## 2017-01-09 NOTE — Telephone Encounter (Signed)
She can try meclizine for vertigo lowest dose daily as needed for episodes.  If it continues, she should get PT.

## 2017-01-13 ENCOUNTER — Ambulatory Visit (INDEPENDENT_AMBULATORY_CARE_PROVIDER_SITE_OTHER): Payer: Medicare Other | Admitting: Nurse Practitioner

## 2017-01-13 ENCOUNTER — Encounter: Payer: Self-pay | Admitting: Nurse Practitioner

## 2017-01-13 VITALS — BP 124/84 | HR 87 | Temp 98.3°F | Resp 17 | Ht 60.0 in | Wt 138.2 lb

## 2017-01-13 DIAGNOSIS — H8110 Benign paroxysmal vertigo, unspecified ear: Secondary | ICD-10-CM

## 2017-01-13 NOTE — Patient Instructions (Signed)
PT for Vestibular rehab to help with dizziness

## 2017-01-13 NOTE — Progress Notes (Signed)
Careteam: Patient Care Team: Gayland Curry, DO as PCP - General (Geriatric Medicine) Warden Fillers, MD as Consulting Physician (Ophthalmology)  Advanced Directive information Does Patient Have a Medical Advance Directive?: Yes, Type of Advance Directive: Healthcare Power of Attorney  Allergies  Allergen Reactions  . Penicillins Rash    Chief Complaint  Patient presents with  . Acute Visit    Pt is being seen due to dizziness with movement x 1 week. Pt did have a fall one day after dizziness started. Pt did hit her head and back but no injury.      HPI: Patient is a 83 y.o. female seen in the office today due to ongoing dizziness. She has been having episodes of dizziness since 01/05/17. She feel on 01/06/17, hit her head and back on the closet door, does not think she was dizzy before her fall. Dizziness happens when she turns her head and reaches for something or turns head in bed. Room is not spinning but uncomfortable. No headaches, earaches, ringing in ear or sinus congestion, no blurred vision.  Constantly dizzy over the last week.  meclizine did not help at all.  No recent changes in medication  Review of Systems:  Review of Systems  Constitutional: Positive for malaise/fatigue. Negative for chills and fever.  HENT: Positive for hearing loss. Negative for congestion, ear discharge, ear pain, sinus pain and tinnitus.   Eyes: Negative for blurred vision.  Respiratory: Negative for shortness of breath.   Cardiovascular: Negative for chest pain and palpitations.  Gastrointestinal: Negative for abdominal pain, blood in stool, constipation and melena.  Genitourinary: Negative for dysuria.  Musculoskeletal: Positive for joint pain. Negative for falls and myalgias.  Skin: Negative for itching and rash.  Neurological: Positive for dizziness. Negative for tingling, sensory change, loss of consciousness, weakness and headaches.  Endo/Heme/Allergies: Bruises/bleeds easily.    Psychiatric/Behavioral: Negative for memory loss. The patient does not have insomnia.     Past Medical History:  Diagnosis Date  . Arthritis    Osteoarthritis  . Depression   . GERD (gastroesophageal reflux disease)   . Hyperlipidemia   . Osteopenia    Past Surgical History:  Procedure Laterality Date  . APPENDECTOMY  1936  . CERVICAL FUSION  2004  . COLON SURGERY  06/12/2010   Colonoscopy  . FINGER SURGERY  1989  . SPINE SURGERY  2009   Back Fusion  . TONSILLECTOMY  1941   Social History:   reports that she has quit smoking. She has never used smokeless tobacco. She reports that she does not drink alcohol or use drugs.  History reviewed. No pertinent family history.  Medications: Patient's Medications  New Prescriptions   No medications on file  Previous Medications   ALENDRONATE (FOSAMAX) 70 MG TABLET    Take one tablet by mouth weekly.  Stay upright for 1 hour after taking.   CHOLECALCIFEROL (VITAMIN D) 1000 UNITS TABLET    Take 1,000 Units by mouth 2 (two) times daily.    DORZOLAMIDE-TIMOLOL (COSOPT) 22.3-6.8 MG/ML OPHTHALMIC SOLUTION    Place 1 drop into both eyes daily.   FLUOXETINE (PROZAC) 20 MG TABLET    Take one tablet by mouth once daily   GABAPENTIN (NEURONTIN) 300 MG CAPSULE       LATANOPROST (XALATAN) 0.005 % OPHTHALMIC SOLUTION    Place 1 drop into both eyes at bedtime.    LISINOPRIL-HYDROCHLOROTHIAZIDE (PRINZIDE,ZESTORETIC) 20-12.5 MG TABLET    Take one tablet by mouth once daily for  blood pressure   MECLIZINE (ANTIVERT) 12.5 MG TABLET    Take 1 tablet (12.5 mg total) by mouth 3 (three) times daily as needed for dizziness.   OXYCODONE-ACETAMINOPHEN (PERCOCET/ROXICET) 5-325 MG PER TABLET    Take 1 tablet by mouth 3 (three) times daily as needed for pain.    PANTOPRAZOLE (PROTONIX) 40 MG TABLET    Take one tablet by mouth once daily for acid reflux   PREDNISONE (DELTASONE) 1 MG TABLET    3 tablets Once a day Orally 90 days   PROBIOTIC PRODUCT (ALIGN PO)     Take by mouth daily.   SIMVASTATIN (ZOCOR) 20 MG TABLET    Take one tablet by mouth once daily for choelsterol  Modified Medications   No medications on file  Discontinued Medications   ASPIRIN 81 MG TABLET    Take 81 mg by mouth daily.   TRIAMCINOLONE CREAM (KENALOG) 0.1 %    1 application apply on the skin twice a day     Physical Exam:  Vitals:   01/13/17 1609  BP: 124/84  Pulse: 87  Resp: 17  Temp: 98.3 F (36.8 C)  TempSrc: Oral  SpO2: 98%  Weight: 138 lb 3.2 oz (62.7 kg)  Height: 5' (1.524 m)   Body mass index is 26.99 kg/m.  Physical Exam  Constitutional: She is oriented to person, place, and time. She appears well-nourished. No distress.  HENT:  Head: Normocephalic and atraumatic.  Right Ear: External ear normal.  Left Ear: External ear normal.  Nose: Nose normal.  Mouth/Throat: Oropharynx is clear and moist.  Eyes: Conjunctivae and EOM are normal. Pupils are equal, round, and reactive to light.  Neck: Normal range of motion.  Cardiovascular: Normal rate, regular rhythm, normal heart sounds and intact distal pulses.   Pulmonary/Chest: Effort normal and breath sounds normal. No respiratory distress.  Abdominal: Bowel sounds are normal.  Musculoskeletal:  Cautious gait; using cane to walk  Neurological: She is alert and oriented to person, place, and time. No cranial nerve deficit or sensory deficit. Coordination normal.  Skin: Skin is warm and dry.  Psychiatric: She has a normal mood and affect.    Labs reviewed: Basic Metabolic Panel:  Recent Labs  01/21/16 1011 07/25/16 1202 12/02/16 1012  NA 143 142 141  K 4.1 4.3 4.2  CL 100 103 104  CO2 27 29 24   GLUCOSE 97 94 97  BUN 16 17 19   CREATININE 0.70 0.71 0.80  CALCIUM 9.4 9.8 9.8  TSH  --  0.32*  --    Liver Function Tests:  Recent Labs  01/21/16 1011 07/25/16 1202 12/02/16 1012  AST 13 15 17   ALT 14 12 15   ALKPHOS 51 41 43  BILITOT 0.2 0.4 0.3  PROT 5.8* 6.4 6.3  ALBUMIN 4.0 4.1 4.0     No results for input(s): LIPASE, AMYLASE in the last 8760 hours. No results for input(s): AMMONIA in the last 8760 hours. CBC:  Recent Labs  01/21/16 1011 07/25/16 1202 12/02/16 1012  WBC 6.5 5.8 5.9  NEUTROABS 3.6 3,074 3,009  HGB  --  11.5* 12.1  HCT 37.8 36.3 37.4  MCV 85 82.7 85.2  PLT 269 248 242   Lipid Panel:  Recent Labs  01/21/16 1011 07/25/16 1202  CHOL 203* 199  HDL 69 87  LDLCALC 87 85  TRIG 234* 137  CHOLHDL 2.9 2.3   TSH:  Recent Labs  07/25/16 1202  TSH 0.32*   A1C: Lab Results  Component Value Date   HGBA1C 5.7 (H) 07/25/2016     Assessment/Plan 1. Benign paroxysmal positional vertigo, unspecified laterality -meclizine not effective.  - PT vestibular rehab; Future for evaluation and treatment of BPPV   Shalene Gallen K. Harle Battiest  [facility] [phone] 8 am - 5 pm) [phone] (after hours)

## 2017-01-22 ENCOUNTER — Encounter: Payer: Self-pay | Admitting: Internal Medicine

## 2017-01-22 ENCOUNTER — Ambulatory Visit (INDEPENDENT_AMBULATORY_CARE_PROVIDER_SITE_OTHER): Payer: Medicare Other | Admitting: Internal Medicine

## 2017-01-22 VITALS — BP 110/70 | HR 90 | Temp 98.0°F | Wt 137.0 lb

## 2017-01-22 DIAGNOSIS — M0550 Rheumatoid polyneuropathy with rheumatoid arthritis of unspecified site: Secondary | ICD-10-CM

## 2017-01-22 DIAGNOSIS — H8112 Benign paroxysmal vertigo, left ear: Secondary | ICD-10-CM

## 2017-01-22 DIAGNOSIS — R296 Repeated falls: Secondary | ICD-10-CM

## 2017-01-22 NOTE — Progress Notes (Signed)
Location:  [facility] Fall clinic Provider: Takeem Krotzer L. Mariea Clonts, D.O., C.M.D.  Code Status: DNR Goals of Care:  Advanced Directives 01/22/2017  Does Patient Have a Medical Advance Directive? No  Type of Advance Directive -  Does patient want to make changes to medical advance directive? -  Copy of [place] in Chart? -  Would patient like information on creating a medical advance directive? No - Patient declined    Chief Complaint  Patient presents with  . Acute Visit    dizzy    HPI: Patient is a 83 y.o. female seen today for an acute visit for dizziness.  [name], NP, saw her for this same complaint on 5/29.  It has persisted.  A referral was placed for vestibular rehab and pt has not heard anything about it.  It's in the appt desk pending an appt being made.  She is less dizzy than she was.   Is getting around her house better.  Getting dizzy when lays down at night or if turns to the left it's worse than the right.  She's been taking 25mg  daily meclizine.  Goes to [facility] of [facility] now.  Had earwax in her ears the last time and that fixed the problem when they were cleaned out.  She has fallen hitting her back and head against a door which led to her dizziness.    Past Medical History:  Diagnosis Date  . Arthritis    Osteoarthritis  . Depression   . GERD (gastroesophageal reflux disease)   . Hyperlipidemia   . Osteopenia     Past Surgical History:  Procedure Laterality Date  . APPENDECTOMY  1936  . CERVICAL FUSION  2004  . COLON SURGERY  06/12/2010   Colonoscopy  . FINGER SURGERY  1989  . SPINE SURGERY  2009   Back Fusion  . TONSILLECTOMY  1941    Allergies  Allergen Reactions  . Penicillins Rash    Allergies as of 01/22/2017      Reactions   Penicillins Rash      Medication List       Accurate as of 01/22/17  8:53 AM. Always use your most recent med list.          alendronate 70 MG tablet Commonly known as:  FOSAMAX Take one tablet  by mouth weekly.  Stay upright for 1 hour after taking.   ALIGN PO Take by mouth daily.   cholecalciferol 1000 units tablet Commonly known as:  VITAMIN D Take 1,000 Units by mouth 2 (two) times daily.   dorzolamide-timolol 22.3-6.8 MG/ML ophthalmic solution Commonly known as:  COSOPT Place 1 drop into both eyes daily.   FLUoxetine 20 MG tablet Commonly known as:  PROZAC Take one tablet by mouth once daily   gabapentin 300 MG capsule Commonly known as:  NEURONTIN   latanoprost 0.005 % ophthalmic solution Commonly known as:  XALATAN Place 1 drop into both eyes at bedtime.   lisinopril-hydrochlorothiazide 20-12.5 MG tablet Commonly known as:  PRINZIDE,ZESTORETIC Take one tablet by mouth once daily for blood pressure   meclizine 12.5 MG tablet Commonly known as:  ANTIVERT Take 1 tablet (12.5 mg total) by mouth 3 (three) times daily as needed for dizziness.   oxyCODONE-acetaminophen 5-325 MG tablet Commonly known as:  PERCOCET/ROXICET Take 1 tablet by mouth 3 (three) times daily as needed for pain.   pantoprazole 40 MG tablet Commonly known as:  PROTONIX Take one tablet by mouth once daily for  acid reflux   predniSONE 1 MG tablet Commonly known as:  DELTASONE 3 tablets Once a day Orally 90 days   simvastatin 20 MG tablet Commonly known as:  ZOCOR Take one tablet by mouth once daily for choelsterol       Review of Systems:  Review of Systems  Constitutional: Negative for chills, fever and malaise/fatigue.  HENT: Negative for congestion and hearing loss.   Eyes: Negative for blurred vision.  Respiratory: Negative for cough and shortness of breath.   Cardiovascular: Negative for chest pain and palpitations.  Gastrointestinal: Negative for abdominal pain, blood in stool, constipation and melena.  Genitourinary: Negative for dysuria.  Musculoskeletal: Positive for back pain, falls and joint pain. Negative for myalgias.  Skin: Negative for itching and rash.    Neurological: Positive for dizziness, tingling and sensory change. Negative for loss of consciousness and weakness.  Psychiatric/Behavioral: Negative for depression and memory loss. The patient is nervous/anxious.     Health Maintenance  Topic Date Due  . INFLUENZA VACCINE  03/18/2017  . TETANUS/TDAP  03/03/2024  . DEXA SCAN  Completed  . PNA vac Low Risk Adult  Completed    Physical Exam: Vitals:   01/22/17 0847  BP: 110/70  Pulse: 90  Temp: 98 F (36.7 C)  TempSrc: Oral  SpO2: 94%  Weight: 137 lb (62.1 kg)   Body mass index is 26.76 kg/m. Physical Exam  Constitutional: She is oriented to person, place, and time. She appears well-developed and well-nourished. No distress.  HENT:  Head: Normocephalic and atraumatic.  Right Ear: External ear normal.  Left Ear: External ear normal.  Nose: Nose normal.  Mouth/Throat: Oropharynx is clear and moist. No oropharyngeal exudate.  Eyes: Conjunctivae are normal. Pupils are equal, round, and reactive to light.  Nystagmus to the left, no cerumen or inner ear fluid accumulation  Cardiovascular: Normal rate, regular rhythm, normal heart sounds and intact distal pulses.   Pulmonary/Chest: Effort normal and breath sounds normal. No respiratory distress.  Abdominal: Soft. Bowel sounds are normal.  Musculoskeletal: Normal range of motion.  Tripped off to the right when walking in with just her cane today  Neurological: She is alert and oriented to person, place, and time.  Skin: Skin is warm and dry. Capillary refill takes less than 2 seconds.  Psychiatric: She has a normal mood and affect.    Labs reviewed: Basic Metabolic Panel:  Recent Labs  07/25/16 1202 12/02/16 1012  NA 142 141  K 4.3 4.2  CL 103 104  CO2 29 24  GLUCOSE 94 97  BUN 17 19  CREATININE 0.71 0.80  CALCIUM 9.8 9.8  TSH 0.32*  --    Liver Function Tests:  Recent Labs  07/25/16 1202 12/02/16 1012  AST 15 17  ALT 12 15  ALKPHOS 41 43  BILITOT 0.4  0.3  PROT 6.4 6.3  ALBUMIN 4.1 4.0   No results for input(s): LIPASE, AMYLASE in the last 8760 hours. No results for input(s): AMMONIA in the last 8760 hours. CBC:  Recent Labs  07/25/16 1202 12/02/16 1012  WBC 5.8 5.9  NEUTROABS 3,074 3,009  HGB 11.5* 12.1  HCT 36.3 37.4  MCV 82.7 85.2  PLT 248 242   Lipid Panel:  Recent Labs  07/25/16 1202  CHOL 199  HDL 87  LDLCALC 85  TRIG 137  CHOLHDL 2.3   Lab Results  Component Value Date   HGBA1C 5.7 (H) 07/25/2016    Assessment/Plan 1. Benign paroxysmal positional vertigo  of left ear - re-entered order for vestibular rehab--apparently in as order not referral so nothing was done with it from last week - encouraged walker use - stop meclizine as ineffective - Ambulatory referral to Physical Therapy  2. Recurrent falls - due to combo of #1, #3 - Ambulatory referral to Physical Therapy  3. 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 Ambulatory referral to Physical Therapy    Referral Priority:   Routine    Referral Type:   Physical Medicine    Referral Reason:   Specialty Services Required    Requested Specialty:   Physical Therapy    Number of Visits Requested:   1    Next appt:  04/09/2017  Carmon Sahli L. Liridona Mashaw, D.O. [facility] [address], [place] [address] Cell Phone (Mon-Fri 8am-5pm):  [phone] On Call:  [phone] & follow prompts after 5pm & weekends Office Phone:  [phone] Office Fax:  [phone]

## 2017-01-29 ENCOUNTER — Encounter: Payer: Self-pay | Admitting: Physical Therapy

## 2017-01-29 ENCOUNTER — Ambulatory Visit: Payer: Medicare Other | Attending: Internal Medicine | Admitting: Physical Therapy

## 2017-01-29 DIAGNOSIS — R2689 Other abnormalities of gait and mobility: Secondary | ICD-10-CM | POA: Diagnosis not present

## 2017-01-29 DIAGNOSIS — R2681 Unsteadiness on feet: Secondary | ICD-10-CM

## 2017-01-29 DIAGNOSIS — H8112 Benign paroxysmal vertigo, left ear: Secondary | ICD-10-CM

## 2017-01-29 DIAGNOSIS — R42 Dizziness and giddiness: Secondary | ICD-10-CM

## 2017-01-29 DIAGNOSIS — R296 Repeated falls: Secondary | ICD-10-CM | POA: Diagnosis not present

## 2017-01-29 NOTE — Therapy (Addendum)
[facility] [address] [place], [place], [address] Phone: [phone]   Fax:  [phone]  Physical Therapy Evaluation  Patient Details  Name: [name] MRN: [id number] Date of Birth: 1926/11/17 Referring Provider: Gayland Curry DO   Encounter Date: 01/29/2017      PT End of Session - 01/29/17 1132    Visit Number 1   Number of Visits 5   Date for PT Re-Evaluation 02/27/17   Authorization Type BCBS Medicare & G-codes every 10th visit    PT Start Time 0850   PT Stop Time 0935   PT Time Calculation (min) 45 min   Activity Tolerance Patient tolerated treatment well   Behavior During Therapy [facility] for tasks assessed/performed      Past Medical History:  Diagnosis Date  . Arthritis    Osteoarthritis  . Depression   . GERD (gastroesophageal reflux disease)   . Hyperlipidemia   . Osteopenia     Past Surgical History:  Procedure Laterality Date  . APPENDECTOMY  1936  . CERVICAL FUSION  2004  . COLON SURGERY  06/12/2010   Colonoscopy  . FINGER SURGERY  1989  . SPINE SURGERY  2009   Back Fusion  . TONSILLECTOMY  1941    There were no vitals filed for this visit.       Subjective Assessment - 01/29/17 0855    Subjective Patient is an 83 year old female who ambulates into PT with a single point cane. Patient reports dizziness when rolling to the L in bed, transferring sit <> supine, and when retrieving objects from the floor since the end of May (approximately 3 weeks). Patient reports she has fallen once since the onset of the dizziness, but she does not believe the fall was caused by her dizziness. No injuries occurred from this fall. She reports her balance has been "off" for several years. She denies any changes in vision or hearing since the onset of dizziness. Patient reports she has been utilizing a single point cane for approximately 3 years for short distance ambulation. She also describes relying on  furniture walking when in her apartment. She reports utilizing a RW when ambulating in the community, or using the cane while holding someone's arm.    Pertinent History arthritis, OA, depression, GERD, hyperlipidemia, osteopenia, cervical fusion (2004), overactive bladder, history of falling    Limitations Lifting;Standing;Walking;House hold activities   Patient Stated Goals decrease dizziness    Currently in Pain? Yes   Pain Score 5    Pain Location Back   Pain Orientation Right;Upper   Pain Descriptors / Indicators Aching   Pain Type Chronic pain   Pain Onset More than a month ago   Pain Frequency Constant   Multiple Pain Sites Yes   Pain Score 3   Pain Location Neck   Pain Orientation Mid   Pain Descriptors / Indicators Aching   Pain Type Chronic pain   Pain Onset More than a month ago   Pain Frequency Constant            [facility] PT Assessment - 01/29/17 0845      Assessment   Medical Diagnosis Vertigo    Referring Provider Gayland Curry DO    Onset Date/Surgical Date 01/22/17  PT referral      Precautions   Precautions Fall     Balance Screen   Has the patient fallen in the past 6 months Yes   How many  times? 2  once since I was dizzy; "I can't remember other time"    Has the patient had a decrease in activity level because of a fear of falling?  Yes   Is the patient reluctant to leave their home because of a fear of falling?  No     Home Environment   Living Environment Private residence   Living Arrangements Alone   Type of [place] One level   [place] - single point;Walker - 4 wheels;Grab bars - tub/shower;Grab bars - toilet     Prior Function   Level of Independence Independent with household mobility with device;Independent with community mobility with device   Leisure shopping, go out to lunch with daughter      Posture/Postural Control   Posture/Postural Control Postural limitations   Postural  Limitations Rounded Shoulders;Forward head;Flexed trunk     Transfers   Transfers Sit to Stand;Stand to Sit;Supine to Sit;Sit to Supine   Sit to Stand 5: Supervision;With upper extremity assist;From chair/3-in-1   Stand to Sit 5: Supervision;With upper extremity assist;To chair/3-in-1   Supine to Sit 4: Min guard;4: Min assist   Supine to Sit Details (indicate cue type and reason) Requires min guard - min A during transfer due to dizziness    Sit to Supine 4: Min guard;4: Min assist   Sit to Supine Details  Requires min guard - min A during transfer due to dizziness      Ambulation/Gait   Ambulation/Gait Yes   Ambulation/Gait Assistance 4: Min guard   Ambulation Distance (Feet) 60 Feet   Assistive device Straight cane   Gait Pattern Step-to pattern;Decreased arm swing - left;Decreased step length - right;Decreased step length - left;Decreased stride length;Right flexed knee in stance;Left flexed knee in stance;Wide base of support   Ambulation Surface Level;Indoor            Vestibular Assessment - 01/29/17 0845      Vestibular Assessment   General Observation Patient ambulates into treatment room with L hand on PT's arm for balance + single point cane on R side     Symptom Behavior   Type of Dizziness Spinning   Frequency of Dizziness daily - everytime she transfers supine <> sit, rolls L, retrieves object from floor   Duration of Dizziness 5-10 minutes    Aggravating Factors Lying supine;Turning head quickly;Rolling to left   Relieving Factors Head stationary     Occulomotor Exam   Occulomotor Alignment Normal   Spontaneous Absent   Gaze-induced Absent   Smooth Pursuits Intact   Saccades Intact     Vestibulo-Occular Reflex   VOR to Slow Head Movement Normal;Comment  slight catch up saccade to L side      Positional Testing   Dix-Hallpike Dix-Hallpike Right;Dix-Hallpike Left     Dix-Hallpike Right   Dix-Hallpike Right Symptoms No nystagmus     Dix-Hallpike Left    Dix-Hallpike Left Duration approximately 15 seconds    Dix-Hallpike Left Symptoms Upbeat, left rotatory nystagmus        Objective measurements completed on examination: See above findings.           Vestibular Treatment/Exercise - 01/29/17 0845      Vestibular Treatment/Exercise   Vestibular Treatment Provided Canalith Repositioning   Canalith Repositioning Epley Manuever Left      EPLEY MANUEVER LEFT   Number of Reps  2   Overall Response  No change  RESPONSE DETAILS LEFT PT performed L Epley's maneuver x 2. PT noted brief downbeating nystagmus both times when patient moved from sidelying to sitting upright at the end of Epley's maneuver. PT qualitatively noted a decrease in the length of time the nystagmus lasted at the end of the second Epley's maneuver. Patient reported dizziness was still present following both repetitions, but reported dizziness lasted for a shorter period of time following the second repetition of L Epley's maneuver. Nonetheless, patient does remains symptomatic following 2 repetitions of L Epley's maneuver.                PT Education - 01/29/17 1131    Education provided Yes   Education Details PT evaluation's findings for positional vertigo; plan of care    Person(s) Educated Patient   Methods Explanation   Comprehension Verbalized understanding             PT Long Term Goals - 01/29/17 1152      PT LONG TERM GOAL #1   Title Patient will report dizziness as </=1/10 when performing bed mobility specifically when transferring sit to supine, supine to sit, and rolling to indicate improvement in dizziness during functional mobility. (TARGET DATE: 02/27/2017)    Time 4   Period Weeks   Status New     PT LONG TERM GOAL #2   Title Patient will demonstrate ability to retrieve an object from the floor with reports of dizziness </= 1/10 to indicate improvment in dizziness and a decrease in her risk of falling during this functional task.  (TARGET DATE: 02/27/2017)    Time 4   Period Weeks   Status New     PT LONG TERM GOAL #3   Title All semicircular canals will be clear when PT assesses them to indicate improvement in patient's positional vertigo and a decrase in her risk of falling. (TARGET DATE: 02/27/2017)    Time 4   Period Weeks   Status New     PT LONG TERM GOAL #4   Title If patient persists with motion sensitivity, patient will verbalize understanding and return demonstration for HEP to reduce dizziness and improve balance. (TARGET DATE: 02/27/2017)    Time 4   Period Weeks   Status New                Plan - 01/29/17 1148    Clinical Impression Statement Patient is an 83 year old female presenting to OPPT neuro with an evolving clinical presentation for a moderate complexity PT evaluation due to sudden onset of dizziness approximately 3 weeks ago. Patient's past medical history is significant for the following: arthritis, OA, depression, GERD, hyperlipidemia, osteopenia, cervical fusion (2004), overactive bladder, and repeated falls. The following deficits were noted during the patient's exam: altered gait mechanics and reliance upon an assistive device providing bilateral UE support (or unilateral UE support on assistive device + holding onto someone's arm) to maintain balance during ambulation increasing her risk of falling. Patient has a positive L Dix Hallpike for L posterior canalthiasis and is symptomatic when performing sit <> supine transfers, rolling to the L, and retrieving objects from the floor, which all place her at an increased risk for falling. PT treated with L Epley's maneuver (x 2), and patient remains symptomatic. Patient's history of falling coupled with new onset of dizziness all indicate that she is at an increased risk for repeated falls. Patient would benefit from skilled PT to address these impairments and functional limitations to maximize  functional mobility independence and reduce falls  risk.       History and Personal Factors relevant to plan of care: lives alone, arthritis, OA, depression, GERD, hyperlipidemia, osteopenia, cervical fusion (2004), overactive bladder, history of falls    Clinical Presentation Evolving   Clinical Presentation due to: sudden, new onset of dizziness during bed mobility    Clinical Decision Making Moderate   Rehab Potential Good   Clinical Impairments Affecting Rehab Potential arthritis, OA, depression, GERD, hyperlipidemia, osteopenia, cervical fusion (2004), overactive bladder, history of repeated falls    PT Frequency 1x / week   PT Duration 4 weeks   PT Treatment/Interventions ADLs/Self Care Home Management;Canalith Repostioning;Neuromuscular re-education;Balance training;Therapeutic exercise;Therapeutic activities;Functional mobility training;Stair training;Gait training;DME Instruction;Patient/family education;Vestibular   PT Next Visit Plan reassess canals and treat as indicated    Consulted and Agree with Plan of Care Patient      Patient will benefit from skilled therapeutic intervention in order to improve the following deficits and impairments:  Abnormal gait, Decreased activity tolerance, Decreased balance, Decreased mobility, Decreased knowledge of use of DME, Difficulty walking, Dizziness  Visit Diagnosis: Other abnormalities of gait and mobility  Dizziness and giddiness  Unsteadiness on feet  Repeated falls  BPPV (benign paroxysmal positional vertigo), left      G-Codes - 02/01/17 1235    Functional Assessment Tool Used (Outpatient Only) Symptoms during bed mobility; clinical judgement    Functional Limitation Changing and maintaining body position   Changing and Maintaining Body Position Current Status (V7616) At least 20 percent but less than 40 percent impaired, limited or restricted   Changing and Maintaining Body Position Goal Status (W7371) 0 percent impaired, limited or restricted     Entered by Raylene Everts,  PT, DPT 02-01-17    12:39 PM     Problem List Patient Active Problem List   Diagnosis Date Noted  . Chronic venous insufficiency 07/26/2015  . Urge incontinence of urine 07/26/2015  . Rheumatoid arthritis involving multiple sites ([place]) 07/26/2015  . Depression 07/26/2015  . Glaucoma 07/26/2015  . Hypertriglyceridemia 07/26/2015  . Postmenopausal estrogen deficiency 07/26/2015  . Primary open angle glaucoma 01/02/2014  . Hyperglycemia 11/18/2012  . Hypertriglyceridemia, essential 11/18/2012  . Hyperlipidemia LDL goal < 100 11/18/2012  . Essential hypertension, benign 11/18/2012  . 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 [place], [place], [address] Phone: [phone]   Fax:  [phone]  Name: [name] MRN: [id number] Date of Birth: 12/23/1926

## 2017-02-05 ENCOUNTER — Encounter: Payer: Self-pay | Admitting: Physical Therapy

## 2017-02-05 ENCOUNTER — Ambulatory Visit: Payer: Medicare Other | Admitting: Physical Therapy

## 2017-02-05 DIAGNOSIS — R2689 Other abnormalities of gait and mobility: Secondary | ICD-10-CM | POA: Diagnosis not present

## 2017-02-05 DIAGNOSIS — R42 Dizziness and giddiness: Secondary | ICD-10-CM

## 2017-02-05 DIAGNOSIS — R296 Repeated falls: Secondary | ICD-10-CM

## 2017-02-05 DIAGNOSIS — H8112 Benign paroxysmal vertigo, left ear: Secondary | ICD-10-CM

## 2017-02-05 DIAGNOSIS — R2681 Unsteadiness on feet: Secondary | ICD-10-CM | POA: Diagnosis not present

## 2017-02-05 NOTE — Therapy (Signed)
[facility] [address] [place], [place], [address] Phone: [phone]   Fax:  [phone]  Physical Therapy Treatment  Patient Details  Name: NIKIAH GOIN MRN: [id number] Date of Birth: July 19, 1927 Referring Provider: Gayland Curry DO   Encounter Date: 02/05/2017      PT End of Session - 02/05/17 0842    Visit Number 2   Number of Visits 5   Date for PT Re-Evaluation 02/27/17   Authorization Type BCBS Medicare & G-codes every 10th visit    PT Start Time 0800   PT Stop Time 0838   PT Time Calculation (min) 38 min   Activity Tolerance Patient tolerated treatment well   Behavior During Therapy [facility] for tasks assessed/performed      Past Medical History:  Diagnosis Date  . Arthritis    Osteoarthritis  . Depression   . GERD (gastroesophageal reflux disease)   . Hyperlipidemia   . Osteopenia     Past Surgical History:  Procedure Laterality Date  . APPENDECTOMY  1936  . CERVICAL FUSION  2004  . COLON SURGERY  06/12/2010   Colonoscopy  . FINGER SURGERY  1989  . SPINE SURGERY  2009   Back Fusion  . TONSILLECTOMY  1941    There were no vitals filed for this visit.      Subjective Assessment - 02/05/17 0807    Subjective No falls; still using RW unless going out in community and uses cane.  Dizziness has improved.  Can now bend down to pick items off floor but still reports vertigo when rolling to L-short duration.   Pertinent History arthritis, OA, depression, GERD, hyperlipidemia, osteopenia, cervical fusion (2004), overactive bladder, history of falling    Limitations Lifting;Standing;Walking;House hold activities   Patient Stated Goals decrease dizziness    Currently in Pain? Yes  chronic back pain                Vestibular Assessment - 02/05/17 0811      Positional Testing   Dix-Hallpike Dix-Hallpike Left     Dix-Hallpike Left   Dix-Hallpike Left Duration 3-4 seconds   Dix-Hallpike  Left Symptoms Upbeat, left rotatory nystagmus                  Vestibular Treatment/Exercise - 02/05/17 0818      Vestibular Treatment/Exercise   Vestibular Treatment Provided Canalith Repositioning   Canalith Repositioning Epley Manuever Left   Habituation Exercises Horizontal Roll      EPLEY MANUEVER LEFT   Number of Reps  2   Overall Response  Improved Symptoms    RESPONSE DETAILS LEFT decreased dizziness when transitioning from side > sitting on second repetition     Horizontal Roll   Symptom Description  Demonstration only-pt advised not to begin habituation exercise for 2 days               PT Education - 02/05/17 0829    Education provided Yes   Education Details video of head positions and BPPV; habituation-repeated rolling when daughter present due to twin bed-fear of falling out   Person(s) Educated Patient;Child(ren)   Methods Explanation;Handout;Demonstration   Comprehension Verbalized understanding             PT Long Term Goals - 01/29/17 1152      PT LONG TERM GOAL #1   Title Patient will report dizziness as </=1/10 when performing bed mobility specifically when transferring sit to supine, supine to sit, and  rolling to indicate improvement in dizziness during functional mobility. (TARGET DATE: 02/27/2017)    Time 4   Period Weeks   Status New     PT LONG TERM GOAL #2   Title Patient will demonstrate ability to retrieve an object from the floor with reports of dizziness </= 1/10 to indicate improvment in dizziness and a decrease in her risk of falling during this functional task. (TARGET DATE: 02/27/2017)    Time 4   Period Weeks   Status New     PT LONG TERM GOAL #3   Title All semicircular canals will be clear when PT assesses them to indicate improvement in patient's positional vertigo and a decrase in her risk of falling. (TARGET DATE: 02/27/2017)    Time 4   Period Weeks   Status New     PT LONG TERM GOAL #4   Title If patient  persists with motion sensitivity, patient will verbalize understanding and return demonstration for HEP to reduce dizziness and improve balance. (TARGET DATE: 02/27/2017)    Time 4   Period Weeks   Status New               Plan - 02/05/17 1017    Clinical Impression Statement Pt is showing improvement in symptoms and improved function-now able to bend down and retrieve items from floor without feeling dizzy.  Pt continues to present with L, upbeat nystagmus and vertigo with positional testing but of much shorter duration.  Pt treated with CRM x 2 with improvement in symptoms.  Provided pt with habituation exercise-repeated rolling-pt to initiate 48 hours from today and was advised to perform when daughter there to supervise due to pt sleeping in twin bed and having severe fear of falling out of bed.  Will continue to assess and treat as pt tolerates.   Rehab Potential Good   Clinical Impairments Affecting Rehab Potential arthritis, OA, depression, GERD, hyperlipidemia, osteopenia, cervical fusion (2004), overactive bladder, history of repeated falls    PT Frequency 1x / week   PT Duration 4 weeks   PT Treatment/Interventions ADLs/Self Care Home Management;Canalith Repostioning;Neuromuscular re-education;Balance training;Therapeutic exercise;Therapeutic activities;Functional mobility training;Stair training;Gait training;DME Instruction;Patient/family education;Vestibular   PT Next Visit Plan reassess canals and treat as indicated; check habituation-repeated rolling   Consulted and Agree with Plan of Care Patient;Family member/caregiver   Family Member Consulted daughter-ERIN      Patient will benefit from skilled therapeutic intervention in order to improve the following deficits and impairments:  Abnormal gait, Decreased activity tolerance, Decreased balance, Decreased mobility, Decreased knowledge of use of DME, Difficulty walking, Dizziness  Visit Diagnosis: Other abnormalities of  gait and mobility  Dizziness and giddiness  Unsteadiness on feet  Repeated falls  BPPV (benign paroxysmal positional vertigo), left     Problem List Patient Active Problem List   Diagnosis Date Noted  . Chronic venous insufficiency 07/26/2015  . Urge incontinence of urine 07/26/2015  . Rheumatoid arthritis involving multiple sites ([place]) 07/26/2015  . Depression 07/26/2015  . Glaucoma 07/26/2015  . Hypertriglyceridemia 07/26/2015  . Postmenopausal estrogen deficiency 07/26/2015  . Primary open angle glaucoma 01/02/2014  . Hyperglycemia 11/18/2012  . Hypertriglyceridemia, essential 11/18/2012  . Hyperlipidemia LDL goal < 100 11/18/2012  . Essential hypertension, benign 11/18/2012  . 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 [place], [place], [address] Phone: [phone]  Fax:  [phone]  Name: KAMARIYA BLEVENS MRN: [id number] Date of Birth: 10/30/1926

## 2017-02-05 NOTE — Patient Instructions (Signed)
Habituation - Tip Card  1.The goal of habituation training is to assist in decreasing symptoms of vertigo, dizziness, or nausea provoked by specific head and body motions. 2.These exercises may initially increase symptoms; however, be persistent and work through symptoms. With repetition and time, the exercises will assist in reducing or eliminating symptoms. 3.Exercises should be stopped and discussed with the therapist if you experience any of the following: - Sudden change or fluctuation in hearing - New onset of ringing in the ears, or increase in current intensity - Any fluid discharge from the ear - Severe pain in neck or back - Extreme nausea  Copyright  VHI. All rights reserved.   Habituation - Rolling   With pillow under head, start on back. Roll to your right side.  Hold until dizziness stops, plus 20 seconds and then roll to the left side.  Hold until dizziness stops, plus 20 seconds.  Repeat sequence 5 times per session. Do 2 sessions per day.      

## 2017-02-12 ENCOUNTER — Ambulatory Visit: Payer: Medicare Other | Admitting: Physical Therapy

## 2017-02-12 ENCOUNTER — Encounter: Payer: Self-pay | Admitting: Physical Therapy

## 2017-02-12 DIAGNOSIS — H8112 Benign paroxysmal vertigo, left ear: Secondary | ICD-10-CM

## 2017-02-12 DIAGNOSIS — R2689 Other abnormalities of gait and mobility: Secondary | ICD-10-CM | POA: Diagnosis not present

## 2017-02-12 DIAGNOSIS — R2681 Unsteadiness on feet: Secondary | ICD-10-CM

## 2017-02-12 DIAGNOSIS — R42 Dizziness and giddiness: Secondary | ICD-10-CM

## 2017-02-12 DIAGNOSIS — R296 Repeated falls: Secondary | ICD-10-CM

## 2017-02-12 NOTE — Therapy (Signed)
[facility] [address] [place], [place], [address] Phone: [phone]   Fax:  [phone]  Physical Therapy Treatment  Patient Details  Name: [name] MRN: [id number] Date of Birth: 11-Mar-1927 Referring Provider: Gayland Curry DO   Encounter Date: 02/12/2017      PT End of Session - 02/12/17 0850    Visit Number 3   Number of Visits 5   Date for PT Re-Evaluation 02/27/17   Authorization Type BCBS Medicare & G-codes every 10th visit    PT Start Time 0805   PT Stop Time 0845   PT Time Calculation (min) 40 min   Activity Tolerance Patient tolerated treatment well   Behavior During Therapy [facility] for tasks assessed/performed      Past Medical History:  Diagnosis Date  . Arthritis    Osteoarthritis  . Depression   . GERD (gastroesophageal reflux disease)   . Hyperlipidemia   . Osteopenia     Past Surgical History:  Procedure Laterality Date  . APPENDECTOMY  1936  . CERVICAL FUSION  2004  . COLON SURGERY  06/12/2010   Colonoscopy  . FINGER SURGERY  1989  . SPINE SURGERY  2009   Back Fusion  . TONSILLECTOMY  1941    There were no vitals filed for this visit.      Subjective Assessment - 02/12/17 0807    Subjective No falls, has not been able to perform habituation due fear of falling out of twin bed and daughter has not been able to assist.  Dizziness has improved; only one episode of very mild dizziness, very short duration.   Pertinent History arthritis, OA, depression, GERD, hyperlipidemia, osteopenia, cervical fusion (2004), overactive bladder, history of falling    Limitations Lifting;Standing;Walking;House hold activities   Patient Stated Goals decrease dizziness    Currently in Pain? No/denies                Vestibular Assessment - 02/12/17 0809      Positional Testing   Dix-Hallpike Dix-Hallpike Left   Horizontal Canal Testing Horizontal Canal Right;Horizontal Canal Left      Dix-Hallpike Left   Dix-Hallpike Left Duration 4-5 seconds   Dix-Hallpike Left Symptoms Upbeat, left rotatory nystagmus     Horizontal Canal Right   Horizontal Canal Right Duration 0   Horizontal Canal Right Symptoms Normal     Horizontal Canal Left   Horizontal Canal Left Duration 0   Horizontal Canal Left Symptoms Nystagmus  initially L rotary nystagmus                  Vestibular Treatment/Exercise - 02/12/17 0816      Vestibular Treatment/Exercise   Vestibular Treatment Provided Canalith Repositioning   Canalith Repositioning Epley Manuever Left      EPLEY MANUEVER LEFT   Number of Reps  1   Overall Response  Symptoms Resolved            Balance Exercises - 02/12/17 0825      Balance Exercises: Standing   Standing Eyes Opened Narrow base of support (BOS);Wide (BOA);Head turns;Solid surface;Other reps (comment)  2 sets x 10 reps vertical and horz head turns   Standing Eyes Closed Narrow base of support (BOS);Wide (BOA);Solid surface;10 secs   Other Standing Exercises Performed reaching to ground to retrieve and then to place cones on floor x 8 reps x 2 sets while side stepping to L and R with UE support on cane and close supervision-min  A due to intermittent posterior LOB when rising-pt denies dizziness-states, "I just have bad balance."                PT Long Term Goals - 01/29/17 1152      PT LONG TERM GOAL #1   Title Patient will report dizziness as </=1/10 when performing bed mobility specifically when transferring sit to supine, supine to sit, and rolling to indicate improvement in dizziness during functional mobility. (TARGET DATE: 02/27/2017)    Time 4   Period Weeks   Status New     PT LONG TERM GOAL #2   Title Patient will demonstrate ability to retrieve an object from the floor with reports of dizziness </= 1/10 to indicate improvment in dizziness and a decrease in her risk of falling during this functional task. (TARGET DATE: 02/27/2017)     Time 4   Period Weeks   Status New     PT LONG TERM GOAL #3   Title All semicircular canals will be clear when PT assesses them to indicate improvement in patient's positional vertigo and a decrase in her risk of falling. (TARGET DATE: 02/27/2017)    Time 4   Period Weeks   Status New     PT LONG TERM GOAL #4   Title If patient persists with motion sensitivity, patient will verbalize understanding and return demonstration for HEP to reduce dizziness and improve balance. (TARGET DATE: 02/27/2017)    Time 4   Period Weeks   Status New               Plan - 02/12/17 0850    Clinical Impression Statement Pt continues to demonstrate improvement in symptoms but upon testing today pt continued to present with nystagmus and vertigo indicating ongoing L posterior canal canalithiasis.  Treated x 1 with CRM with complete resolution of symptoms.  Focused rest of session on dynamic and static balance incorporating reaching to floor, more narrow BOS and head turns.  Will continue to address to minimize risk for falls.   Rehab Potential Good   Clinical Impairments Affecting Rehab Potential arthritis, OA, depression, GERD, hyperlipidemia, osteopenia, cervical fusion (2004), overactive bladder, history of repeated falls    PT Frequency 1x / week   PT Duration 4 weeks   PT Next Visit Plan reassess for L BPPV and treat if indicated (Have been performing modified hallpike with pillow at low back); continue to work on static standing balance with head turns, decreasing UE dependence.  May be able to D/C at next visit.   Consulted and Agree with Plan of Care Patient;Family member/caregiver   Family Member Consulted daughter-ERIN      Patient will benefit from skilled therapeutic intervention in order to improve the following deficits and impairments:  Abnormal gait, Decreased activity tolerance, Decreased balance, Decreased mobility, Decreased knowledge of use of DME, Difficulty walking,  Dizziness  Visit Diagnosis: Other abnormalities of gait and mobility  Dizziness and giddiness  Unsteadiness on feet  Repeated falls  BPPV (benign paroxysmal positional vertigo), left     Problem List Patient Active Problem List   Diagnosis Date Noted  . Chronic venous insufficiency 07/26/2015  . Urge incontinence of urine 07/26/2015  . Rheumatoid arthritis involving multiple sites ([place]) 07/26/2015  . Depression 07/26/2015  . Glaucoma 07/26/2015  . Hypertriglyceridemia 07/26/2015  . Postmenopausal estrogen deficiency 07/26/2015  . Primary open angle glaucoma 01/02/2014  . Hyperglycemia 11/18/2012  . Hypertriglyceridemia, essential 11/18/2012  . Hyperlipidemia LDL goal < 100  11/18/2012  . Essential hypertension, benign 11/18/2012  . 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 [place], [place], [address] Phone: [phone]   Fax:  [phone]  Name: [name] MRN: [id number] Date of Birth: 11/01/1926

## 2017-02-19 ENCOUNTER — Other Ambulatory Visit: Payer: Self-pay | Admitting: *Deleted

## 2017-02-19 MED ORDER — LISINOPRIL-HYDROCHLOROTHIAZIDE 20-12.5 MG PO TABS: 1.0000 | ORAL_TABLET | Freq: Every day | ORAL | 3 refills | Status: DC

## 2017-02-27 ENCOUNTER — Ambulatory Visit: Payer: Medicare Other | Attending: Internal Medicine

## 2017-02-27 DIAGNOSIS — R42 Dizziness and giddiness: Secondary | ICD-10-CM | POA: Diagnosis not present

## 2017-02-27 DIAGNOSIS — R2689 Other abnormalities of gait and mobility: Secondary | ICD-10-CM | POA: Diagnosis not present

## 2017-02-27 DIAGNOSIS — H8112 Benign paroxysmal vertigo, left ear: Secondary | ICD-10-CM | POA: Diagnosis not present

## 2017-02-27 NOTE — Therapy (Signed)
[facility] [address] [place], [place], [address] Phone: [phone]   Fax:  [phone]  Physical Therapy Treatment  Patient Details  Name: [name] MRN: [id number] Date of Birth: [birth year] Referring Provider: Gayland Curry DO   Encounter Date: 02/27/2017      PT End of Session - 02/27/17 1106    Visit Number 4   Number of Visits 5   Date for PT Re-Evaluation 02/27/17   Authorization Type BCBS Medicare & G-codes every 10th visit    PT Start Time 1020   PT Stop Time 1100   PT Time Calculation (min) 40 min   Equipment Utilized During Treatment --  HHA and S prn   Activity Tolerance Patient tolerated treatment well   Behavior During Therapy [facility] for tasks assessed/performed      Past Medical History:  Diagnosis Date  . Arthritis    Osteoarthritis  . Depression   . GERD (gastroesophageal reflux disease)   . Hyperlipidemia   . Osteopenia     Past Surgical History:  Procedure Laterality Date  . APPENDECTOMY  1936  . CERVICAL FUSION  2004  . COLON SURGERY  06/12/2010   Colonoscopy  . FINGER SURGERY  1989  . SPINE SURGERY  2009   Back Fusion  . TONSILLECTOMY  1941    There were no vitals filed for this visit.      Subjective Assessment - 02/27/17 1024    Subjective Pt denied falls or changes since last visit. Pt denied dizziness since last visit. Pt reported she hasn't performed exercises, as she has hx of falling out of bed.   Pertinent History arthritis, OA, depression, GERD, hyperlipidemia, osteopenia, cervical fusion (2004), overactive bladder, history of falling    Patient Stated Goals decrease dizziness    Currently in Pain? Yes   Pain Score 6    Pain Location Back   Pain Orientation Lower   Pain Descriptors / Indicators Aching   Pain Type Chronic pain   Pain Onset More than a month ago   Pain Frequency Constant   Aggravating Factors  unsure   Pain Relieving Factors pain medication                 Vestibular Assessment - 02/27/17 1026      Dix-Hallpike Right   Dix-Hallpike Right Duration none   Dix-Hallpike Right Symptoms No nystagmus     Dix-Hallpike Left   Dix-Hallpike Left Duration <15 sec.   Dix-Hallpike Left Symptoms Upbeat, left rotatory nystagmus     Horizontal Canal Right   Horizontal Canal Right Duration none   Horizontal Canal Right Symptoms Normal     Horizontal Canal Left   Horizontal Canal Left Duration none   Horizontal Canal Left Symptoms Normal                  Vestibular Treatment/Exercise - 02/27/17 1041      Vestibular Treatment/Exercise   Vestibular Treatment Provided Canalith Repositioning   Canalith Repositioning Epley Manuever Left;Semont Procedure Left Posterior      EPLEY MANUEVER LEFT   Number of Reps  1   Overall Response  Improved Symptoms    RESPONSE DETAILS LEFT Pt reported no dizziness upon retesting but L upbeating nystagmus.     Semont Procedure Left Posterior   Number of Reps  1   Overall Response Symptoms Resolved   Response Details  Pt denied dizziness and no nystagmus noted.  Balance Exercises - 02/27/17 1103      Balance Exercises: Standing   Standing Eyes Opened Narrow base of support (BOS);Solid surface;2 reps;30 secs;Wide (BOA);Head turns;10 secs  per activity   Standing Eyes Closed Wide (BOA);Solid surface;2 reps;30 secs   Other Standing Exercises Performed in corner with chair and S to ensure safety. Cues and demo for technique. Please see pt insructions for details.            PT Education - 02/27/17 1104    Education provided Yes   Education Details PT discussed exam findings and positive testing for L pBPPV. PT provided pt with balance and vestibular HEP. PT educated pt on the importance of addressing balance issues but pt declined prolonging PT for balance, as she reported she has tried PT for balance several times and it did not help. Pt will continue PT for  BPPV. PT educated pt on using rollator at all times for safety, as pt required HHA to amb. and maintain balance.    Person(s) Educated Patient;Child(ren)  Erin-dtr educated on rollator    Methods Explanation;Demonstration;Verbal cues;Handout   Comprehension Returned demonstration;Verbalized understanding             PT Long Term Goals - 02/27/17 1111      PT LONG TERM GOAL #1   Title Patient will report dizziness as </=1/10 when performing bed mobility specifically when transferring sit to supine, supine to sit, and rolling to indicate improvement in dizziness during functional mobility. (TARGET DATE: 02/27/2017)    Time 4   Period Weeks   Status Achieved     PT LONG TERM GOAL #2   Title Patient will demonstrate ability to retrieve an object from the floor with reports of dizziness </= 1/10 to indicate improvment in dizziness and a decrease in her risk of falling during this functional task. (TARGET DATE: 02/27/2017)    Time 4   Period Weeks   Status Achieved     PT LONG TERM GOAL #3   Title All semicircular canals will be clear when PT assesses them to indicate improvement in patient's positional vertigo and a decrase in her risk of falling. (TARGET DATE: 02/27/2017)    Time 4   Period Weeks   Status On-going     PT LONG TERM GOAL #4   Title If patient persists with motion sensitivity, patient will verbalize understanding and return demonstration for HEP to reduce dizziness and improve balance. (TARGET DATE: 02/27/2017)    Time 4   Period Weeks   Status On-going               Plan - 02/27/17 1106    Clinical Impression Statement LTGs 1 and 2 met, LTGs 3 and 4 are on-going. Pt continues to demonstrate improvement in sx's. However, pt experienced L upbeating nystagmus and 1/10 dizziness during L Dix-Hallpike, indicating ongoing L pBPPV. Pt continued to experience L upbeating nystagmus after Epley treatment but denied dizziness; pt had difficulty positioning during L  Dix-Hallpike. Therefore, PT performed Semont treatment for L BPPV with complete resolution of sx's. PT provided pt with vestibular and balance HEP to improve sx's and balance. PT requesting additional visits, to ensure resolution of BPPV prior to d/c.    Rehab Potential Good   Clinical Impairments Affecting Rehab Potential arthritis, OA, depression, GERD, hyperlipidemia, osteopenia, cervical fusion (2004), overactive bladder, history of repeated falls    PT Frequency 1x / week   PT Duration 4 weeks   PT Next Visit  Plan reassess for L BPPV and treat if indicated (Have been performing modified hallpike with pillow at low back); continue to work on static standing balance with head turns, decreasing UE dependence.  May be able to D/C at next visit.   Consulted and Agree with Plan of Care Patient;Family member/caregiver   Family Member Consulted daughter-ERIN      Patient will benefit from skilled therapeutic intervention in order to improve the following deficits and impairments:  Abnormal gait, Decreased activity tolerance, Decreased balance, Decreased mobility, Decreased knowledge of use of DME, Difficulty walking, Dizziness  Visit Diagnosis: BPPV (benign paroxysmal positional vertigo), left - Plan: PT plan of care cert/re-cert  Dizziness and giddiness - Plan: PT plan of care cert/re-cert  Other abnormalities of gait and mobility - Plan: PT plan of care cert/re-cert     Problem List Patient Active Problem List   Diagnosis Date Noted  . Chronic venous insufficiency 07/26/2015  . Urge incontinence of urine 07/26/2015  . Rheumatoid arthritis involving multiple sites ([place]) 07/26/2015  . Depression 07/26/2015  . Glaucoma 07/26/2015  . Hypertriglyceridemia 07/26/2015  . Postmenopausal estrogen deficiency 07/26/2015  . Primary open angle glaucoma 01/02/2014  . Hyperglycemia 11/18/2012  . Hypertriglyceridemia, essential 11/18/2012  . Hyperlipidemia LDL goal < 100 11/18/2012  . Essential  hypertension, benign 11/18/2012  . 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

## 2017-02-27 NOTE — Patient Instructions (Addendum)
Perform at kitchen sink with chair behind you OR in a corner with chair in front of you for safety:  Feet Apart, Head Motion - Eyes Open    With eyes open, feet apart, move head slowly: up and down 5 times and side and side 5 times. Repeat __3__ times per session. Do __1__ sessions per day.  Copyright  VHI. All rights reserved.  Feet Apart, Varied Arm Positions - Eyes Closed    Stand with feet shoulder width apart and arms at your side. Close eyes and visualize upright position. Hold __10-30__ seconds. Repeat _3___ times per session. Do __1__ sessions per day.  Copyright  VHI. All rights reserved.  Feet Together, Varied Arm Positions - Eyes Open    With eyes open, feet together, arms at your side, look straight ahead at a stationary object. Hold _30___ seconds. Repeat __3__ times per session. Do __1__ sessions per day.  Copyright  VHI. All rights reserved.

## 2017-03-04 ENCOUNTER — Ambulatory Visit: Payer: Medicare Other

## 2017-03-04 DIAGNOSIS — R42 Dizziness and giddiness: Secondary | ICD-10-CM | POA: Diagnosis not present

## 2017-03-04 DIAGNOSIS — H8112 Benign paroxysmal vertigo, left ear: Secondary | ICD-10-CM

## 2017-03-04 DIAGNOSIS — R2689 Other abnormalities of gait and mobility: Secondary | ICD-10-CM | POA: Diagnosis not present

## 2017-03-04 NOTE — Therapy (Signed)
[facility] [address] [place], [place], [address] Phone: [phone]   Fax:  [phone]  Physical Therapy Treatment  Patient Details  Name: [name] MRN: [id number] Date of Birth: 11-19-1926 Referring Provider: Gayland Curry DO   Encounter Date: 03/04/2017      PT End of Session - 03/04/17 1037    Visit Number 5   Number of Visits 5   Date for PT Re-Evaluation 03/30/17   Authorization Type BCBS Medicare & G-codes every 10th visit    PT Start Time 1015   PT Stop Time 1030   PT Time Calculation (min) 15 min   Activity Tolerance Patient tolerated treatment well   Behavior During Therapy [facility] for tasks assessed/performed      Past Medical History:  Diagnosis Date  . Arthritis    Osteoarthritis  . Depression   . GERD (gastroesophageal reflux disease)   . Hyperlipidemia   . Osteopenia     Past Surgical History:  Procedure Laterality Date  . APPENDECTOMY  1936  . CERVICAL FUSION  2004  . COLON SURGERY  06/12/2010   Colonoscopy  . FINGER SURGERY  1989  . SPINE SURGERY  2009   Back Fusion  . TONSILLECTOMY  1941    There were no vitals filed for this visit.      Subjective Assessment - 03/04/17 1018    Subjective Pt denied falls or changes since last visit. Pt denied dizziness since last visit.    Pertinent History arthritis, OA, depression, GERD, hyperlipidemia, osteopenia, cervical fusion (2004), overactive bladder, history of falling    Limitations Lifting;Standing;Walking;House hold activities   Patient Stated Goals decrease dizziness    Currently in Pain? Yes   Pain Score 7    Pain Location Back   Pain Orientation Lower   Pain Descriptors / Indicators Aching   Pain Type Chronic pain   Pain Onset More than a month ago   Pain Frequency Constant   Aggravating Factors  not sure   Pain Relieving Factors pain meds           Incr. Time to obtain assessment position and pillow placed  under back for comfort.     Vestibular Assessment - 03/04/17 1034      Dix-Hallpike Left   Dix-Hallpike Left Duration No dizziness reported, possible 1-2 small beats of L upbeating nystagmus, but resolved quickly and difficult to assess   Dix-Hallpike Left Symptoms Upbeat, left rotatory nystagmus;Other (comment)  1-2 beats                  Vestibular Treatment/Exercise - 03/04/17 1035      Vestibular Treatment/Exercise   Vestibular Treatment Provided Canalith Repositioning   Canalith Repositioning Epley Manuever Left      EPLEY MANUEVER LEFT   Number of Reps  1   Overall Response  Symptoms Resolved    RESPONSE DETAILS LEFT Pt denied dizziness during testing but PT observed what was likely 1-2 beats of L upbeating nystagmus, L Epley performed to ensure nystagmus resolved.               PT Education - 03/04/17 1036    Education provided Yes   Education Details PT educated pt on goal progress and d/c. Pt stated she would like to cease PT as she is not interested in participating in balance PT again and dizziness is resolved.   Person(s) Educated Patient   Methods Explanation   Comprehension Verbalized understanding  PT Long Term Goals - 16-Mar-2017 1042      PT LONG TERM GOAL #1   Title Patient will report dizziness as </=1/10 when performing bed mobility specifically when transferring sit to supine, supine to sit, and rolling to indicate improvement in dizziness during functional mobility. (TARGET DATE: 02/27/2017)    Time 4   Period Weeks   Status Achieved     PT LONG TERM GOAL #2   Title Patient will demonstrate ability to retrieve an object from the floor with reports of dizziness </= 1/10 to indicate improvment in dizziness and a decrease in her risk of falling during this functional task. (TARGET DATE: 02/27/2017)    Time 4   Period Weeks   Status Achieved     PT LONG TERM GOAL #3   Title All semicircular canals will be clear when PT  assesses them to indicate improvement in patient's positional vertigo and a decrase in her risk of falling. (TARGET DATE: 02/27/2017)    Time 4   Period Weeks   Status Partially Met     PT LONG TERM GOAL #4   Title If patient persists with motion sensitivity, patient will verbalize understanding and return demonstration for HEP to reduce dizziness and improve balance. (TARGET DATE: 02/27/2017)    Time 4   Period Weeks   Status Achieved               Plan - 03/16/17 1039    Clinical Impression Statement Pt partially met LTG 3, as 1-2 beats of nystagmus noted, consistent with L pBBPV. However, pt denied dizziness. PT performed L Epley's treatment to ensure nystagmus resolves. LTG 4 met. Pt d/c today based on progress, and pt stating she does not wish to participate in balance PT again. Please see d/c summary for details.    Rehab Potential Good   Clinical Impairments Affecting Rehab Potential arthritis, OA, depression, GERD, hyperlipidemia, osteopenia, cervical fusion (2004), overactive bladder, history of repeated falls    PT Frequency 1x / week   PT Duration 4 weeks   PT Next Visit Plan d/c   Consulted and Agree with Plan of Care Patient;Family member/caregiver   Family Member Consulted daughter-ERIN      Patient will benefit from skilled therapeutic intervention in order to improve the following deficits and impairments:  Abnormal gait, Decreased activity tolerance, Decreased balance, Decreased mobility, Decreased knowledge of use of DME, Difficulty walking, Dizziness  Visit Diagnosis: BPPV (benign paroxysmal positional vertigo), left  Dizziness and giddiness       G-Codes - 03/16/17 1043    Functional Assessment Tool Used (Outpatient Only) Symptoms during bed mobility; clinical judgement    Functional Limitation Changing and maintaining body position   Changing and Maintaining Body Position Goal Status (P6195) 0 percent impaired, limited or restricted   Changing and  Maintaining Body Position Discharge Status (K9326) 0 percent impaired, limited or restricted      Problem List Patient Active Problem List   Diagnosis Date Noted  . Chronic venous insufficiency 07/26/2015  . Urge incontinence of urine 07/26/2015  . Rheumatoid arthritis involving multiple sites ([place]) 07/26/2015  . Depression 07/26/2015  . Glaucoma 07/26/2015  . Hypertriglyceridemia 07/26/2015  . Postmenopausal estrogen deficiency 07/26/2015  . Primary open angle glaucoma 01/02/2014  . Hyperglycemia 11/18/2012  . Hypertriglyceridemia, essential 11/18/2012  . Hyperlipidemia LDL goal < 100 11/18/2012  . Essential hypertension, benign 11/18/2012  . 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) ([place]) 11/18/2012    Chi Woodham L 03-16-2017,  10:43 AM  [facility] [address] [place], [place], [address] Phone: [phone]   Fax:  [phone]  Name: [name] MRN: [id number] Date of Birth: 04/19/1927  PHYSICAL THERAPY DISCHARGE SUMMARY  Visits from Start of Care: 5  Current functional level related to goals / functional outcomes:     PT Long Term Goals - 03/04/17 1042      PT LONG TERM GOAL #1   Title Patient will report dizziness as </=1/10 when performing bed mobility specifically when transferring sit to supine, supine to sit, and rolling to indicate improvement in dizziness during functional mobility. (TARGET DATE: 02/27/2017)    Time 4   Period Weeks   Status Achieved     PT LONG TERM GOAL #2   Title Patient will demonstrate ability to retrieve an object from the floor with reports of dizziness </= 1/10 to indicate improvment in dizziness and a decrease in her risk of falling during this functional task. (TARGET DATE: 02/27/2017)    Time 4   Period Weeks   Status Achieved     PT LONG TERM GOAL #3   Title All semicircular canals will be clear when PT assesses them to indicate improvement in patient's  positional vertigo and a decrase in her risk of falling. (TARGET DATE: 02/27/2017)    Time 4   Period Weeks   Status Partially Met     PT LONG TERM GOAL #4   Title If patient persists with motion sensitivity, patient will verbalize understanding and return demonstration for HEP to reduce dizziness and improve balance. (TARGET DATE: 02/27/2017)    Time 4   Period Weeks   Status Achieved        Remaining deficits: Impaired balance, pt refuses balance PT.   Education / Equipment: HEP  Plan: Patient agrees to discharge.  Patient goals were met. Patient is being discharged due to meeting the stated rehab goals.  ?????           Geoffry Paradise, PT,DPT 03/04/17 10:44 AM Phone: [phone] Fax: [phone]

## 2017-03-05 ENCOUNTER — Telehealth: Payer: Self-pay

## 2017-03-05 NOTE — Telephone Encounter (Signed)
Patient's disability parking placard application has been completed and it was placed in front filing cabinet. A message was left on patient's voicemail.

## 2017-03-19 ENCOUNTER — Other Ambulatory Visit: Payer: Self-pay

## 2017-03-19 MED ORDER — FLUOXETINE HCL 20 MG PO TABS: ORAL_TABLET | ORAL | 1 refills | Status: DC

## 2017-03-19 NOTE — Telephone Encounter (Signed)
Refill request from [name]  Phone [phone]

## 2017-03-23 DIAGNOSIS — H43813 Vitreous degeneration, bilateral: Secondary | ICD-10-CM | POA: Diagnosis not present

## 2017-03-23 DIAGNOSIS — Z961 Presence of intraocular lens: Secondary | ICD-10-CM | POA: Diagnosis not present

## 2017-03-23 DIAGNOSIS — H401132 Primary open-angle glaucoma, bilateral, moderate stage: Secondary | ICD-10-CM | POA: Diagnosis not present

## 2017-03-25 DIAGNOSIS — C44629 Squamous cell carcinoma of skin of left upper limb, including shoulder: Secondary | ICD-10-CM | POA: Diagnosis not present

## 2017-03-25 DIAGNOSIS — C4441 Basal cell carcinoma of skin of scalp and neck: Secondary | ICD-10-CM | POA: Diagnosis not present

## 2017-03-25 DIAGNOSIS — D0439 Carcinoma in situ of skin of other parts of face: Secondary | ICD-10-CM | POA: Diagnosis not present

## 2017-03-25 DIAGNOSIS — Z85828 Personal history of other malignant neoplasm of skin: Secondary | ICD-10-CM | POA: Diagnosis not present

## 2017-03-25 DIAGNOSIS — L718 Other rosacea: Secondary | ICD-10-CM | POA: Diagnosis not present

## 2017-03-26 DIAGNOSIS — M1990 Unspecified osteoarthritis, unspecified site: Secondary | ICD-10-CM | POA: Diagnosis not present

## 2017-03-26 DIAGNOSIS — M5416 Radiculopathy, lumbar region: Secondary | ICD-10-CM | POA: Diagnosis not present

## 2017-03-26 DIAGNOSIS — M150 Primary generalized (osteo)arthritis: Secondary | ICD-10-CM | POA: Diagnosis not present

## 2017-03-26 DIAGNOSIS — M797 Fibromyalgia: Secondary | ICD-10-CM | POA: Diagnosis not present

## 2017-04-09 ENCOUNTER — Ambulatory Visit: Payer: Medicare Other | Admitting: Internal Medicine

## 2017-04-13 DIAGNOSIS — C4441 Basal cell carcinoma of skin of scalp and neck: Secondary | ICD-10-CM | POA: Diagnosis not present

## 2017-04-13 DIAGNOSIS — Z85828 Personal history of other malignant neoplasm of skin: Secondary | ICD-10-CM | POA: Diagnosis not present

## 2017-04-23 ENCOUNTER — Other Ambulatory Visit: Payer: Self-pay | Admitting: *Deleted

## 2017-04-23 MED ORDER — PANTOPRAZOLE SODIUM 40 MG PO TBEC: DELAYED_RELEASE_TABLET | ORAL | 3 refills | Status: DC

## 2017-04-23 NOTE — Telephone Encounter (Signed)
[facility]

## 2017-05-18 ENCOUNTER — Ambulatory Visit (INDEPENDENT_AMBULATORY_CARE_PROVIDER_SITE_OTHER): Payer: Medicare Other | Admitting: Internal Medicine

## 2017-05-18 ENCOUNTER — Encounter: Payer: Self-pay | Admitting: Internal Medicine

## 2017-05-18 VITALS — BP 120/70 | HR 66 | Temp 98.2°F | Wt 136.0 lb

## 2017-05-18 DIAGNOSIS — E1169 Type 2 diabetes mellitus with other specified complication: Secondary | ICD-10-CM

## 2017-05-18 DIAGNOSIS — E785 Hyperlipidemia, unspecified: Secondary | ICD-10-CM | POA: Diagnosis not present

## 2017-05-18 DIAGNOSIS — N3281 Overactive bladder: Secondary | ICD-10-CM

## 2017-05-18 DIAGNOSIS — R296 Repeated falls: Secondary | ICD-10-CM | POA: Diagnosis not present

## 2017-05-18 DIAGNOSIS — Z23 Encounter for immunization: Secondary | ICD-10-CM

## 2017-05-18 DIAGNOSIS — I872 Venous insufficiency (chronic) (peripheral): Secondary | ICD-10-CM | POA: Diagnosis not present

## 2017-05-18 DIAGNOSIS — M0609 Rheumatoid arthritis without rheumatoid factor, multiple sites: Secondary | ICD-10-CM

## 2017-05-18 DIAGNOSIS — M0550 Rheumatoid polyneuropathy with rheumatoid arthritis of unspecified site: Secondary | ICD-10-CM

## 2017-05-18 DIAGNOSIS — I10 Essential (primary) hypertension: Secondary | ICD-10-CM | POA: Diagnosis not present

## 2017-05-18 DIAGNOSIS — H8110 Benign paroxysmal vertigo, unspecified ear: Secondary | ICD-10-CM

## 2017-05-18 NOTE — Progress Notes (Signed)
Location:  [facility] clinic Provider:  Elida Harbin L. Mariea Clonts, D.O., C.M.D.  Code Status: DNR Goals of Care:  Advanced Directives 01/29/2017  Does Patient Have a Medical Advance Directive? Yes  Type of Paramedic of [place];Living will  Does patient want to make changes to medical advance directive? -  Copy of [place] in Chart? -  Would patient like information on creating a medical advance directive? -   Chief Complaint  Patient presents with  . Medical Management of Chronic Issues    87mth follow-up    HPI: Patient is a 83 y.o. female seen today for medical management of chronic diseases.    Vertigo over thank goodness.  Had several skin cancers removed.  The mohs surgery on her scalp required three attempts.  Has two sutures that are meant to work their way out.  There's one left in there now.    Has fallen a couple of times.  Has to have the ems pick her up off the floor when she does fall--has life alert to reach them.  Only uses the cane to walk outside--recommended regular use, but she was clear she will not do it and prefers furniture surfing.  Does not go out on her own.  Uses the walker inside.  Her daughter comes with her or takes a cart when she arrives.  Was not holding onto her walker when she fell the last time.   Urinary incontinence:  Wears a pad in the daytime and depends at night.    Does have a diuretic in her regimen, but does not want to change b/c then she'll need compression hose.  Glaucoma:  Unchanged.  Continues on same drops.  BP at goal.  On ace/hctz only  RA pain unchanged.  Tired of being in pain.    Osteoporosis:  On fosamax, vitamin D and does walk as weightbearing exercise, but not explicitly to exercise, just to do her daily routine.   Last bone density 09/19/15 showed improvement in bone density and was already on fosamax then.  Likely needs to change soon to avoid fragility fx risk.  Past Medical History:    Diagnosis Date  . Arthritis    Osteoarthritis  . Depression   . GERD (gastroesophageal reflux disease)   . Hyperlipidemia   . Osteopenia     Past Surgical History:  Procedure Laterality Date  . APPENDECTOMY  1936  . CERVICAL FUSION  2004  . COLON SURGERY  06/12/2010   Colonoscopy  . FINGER SURGERY  1989  . SPINE SURGERY  2009   Back Fusion  . TONSILLECTOMY  1941    Allergies  Allergen Reactions  . Penicillins Rash    Outpatient Encounter Prescriptions as of 05/18/2017  Medication Sig  . alendronate (FOSAMAX) 70 MG tablet Take one tablet by mouth weekly.  Stay upright for 1 hour after taking.  . cholecalciferol (VITAMIN D) 1000 UNITS tablet Take 1,000 Units by mouth 2 (two) times daily.   . dorzolamide-timolol (COSOPT) 22.3-6.8 MG/ML ophthalmic solution Place 1 drop into both eyes daily.  Marland Kitchen FLUoxetine (PROZAC) 20 MG tablet Take one tablet by mouth once daily  . gabapentin (NEURONTIN) 300 MG capsule   . latanoprost (XALATAN) 0.005 % ophthalmic solution Place 1 drop into both eyes at bedtime.   Marland Kitchen lisinopril-hydrochlorothiazide (PRINZIDE,ZESTORETIC) 20-12.5 MG tablet Take 1 tablet by mouth daily.  . meclizine (ANTIVERT) 12.5 MG tablet Take 1 tablet (12.5 mg total) by mouth 3 (three) times daily  as needed for dizziness.  Marland Kitchen oxyCODONE-acetaminophen (PERCOCET/ROXICET) 5-325 MG per tablet Take 1 tablet by mouth 3 (three) times daily as needed for pain.   . pantoprazole (PROTONIX) 40 MG tablet Take one tablet by mouth once daily for acid reflux  . predniSONE (DELTASONE) 1 MG tablet 3 tablets Once a day Orally 90 days  . Probiotic Product (ALIGN PO) Take by mouth daily.  . simvastatin (ZOCOR) 20 MG tablet Take one tablet by mouth once daily for choelsterol   No facility-administered encounter medications on file as of 05/18/2017.     Review of Systems:  Review of Systems  Constitutional: Negative for chills, fever and malaise/fatigue.  HENT: Negative for congestion and hearing  loss.   Eyes: Negative for blurred vision.       Glaucoma  Respiratory: Negative for cough and shortness of breath.   Cardiovascular: Negative for chest pain, palpitations and leg swelling.  Gastrointestinal: Negative for abdominal pain, blood in stool, constipation and melena.  Genitourinary: Positive for frequency and urgency. Negative for dysuria.  Musculoskeletal: Positive for falls and joint pain.  Skin: Negative for itching and rash.  Neurological: Positive for dizziness. Negative for weakness.  Endo/Heme/Allergies: Bruises/bleeds easily.  Psychiatric/Behavioral: Positive for depression. Negative for memory loss. The patient is nervous/anxious.     Health Maintenance  Topic Date Due  . INFLUENZA VACCINE  03/18/2017  . TETANUS/TDAP  03/03/2024  . DEXA SCAN  Completed  . PNA vac Low Risk Adult  Completed    Physical Exam: Vitals:   05/18/17 1344  BP: 120/70  Pulse: 66  Temp: 98.2 F (36.8 C)  TempSrc: Oral  SpO2: 96%  Weight: 136 lb (61.7 kg)   Body mass index is 26.56 kg/m. Physical Exam  Constitutional: She is oriented to person, place, and time. No distress.  Cardiovascular: Normal rate, regular rhythm, normal heart sounds and intact distal pulses.   Pulmonary/Chest: Effort normal and breath sounds normal.  Abdominal: Bowel sounds are normal.  Cushingoid appearance  Musculoskeletal: Normal range of motion.  Uses cane, unsteady  Neurological: She is alert and oriented to person, place, and time.  Skin: Skin is warm and dry.  Psychiatric: She has a normal mood and affect.    Labs reviewed: Basic Metabolic Panel:  Recent Labs  07/25/16 1202 12/02/16 1012  NA 142 141  K 4.3 4.2  CL 103 104  CO2 29 24  GLUCOSE 94 97  BUN 17 19  CREATININE 0.71 0.80  CALCIUM 9.8 9.8  TSH 0.32*  --    Liver Function Tests:  Recent Labs  07/25/16 1202 12/02/16 1012  AST 15 17  ALT 12 15  ALKPHOS 41 43  BILITOT 0.4 0.3  PROT 6.4 6.3  ALBUMIN 4.1 4.0   No  results for input(s): LIPASE, AMYLASE in the last 8760 hours. No results for input(s): AMMONIA in the last 8760 hours. CBC:  Recent Labs  07/25/16 1202 12/02/16 1012  WBC 5.8 5.9  NEUTROABS 3,074 3,009  HGB 11.5* 12.1  HCT 36.3 37.4  MCV 82.7 85.2  PLT 248 242   Lipid Panel:  Recent Labs  07/25/16 1202  CHOL 199  HDL 87  LDLCALC 85  TRIG 137  CHOLHDL 2.3   Lab Results  Component Value Date   HGBA1C 5.7 (H) 07/25/2016    Assessment/Plan 1. Recurrent falls -ongoing, recommended she use her cane at all times not just when out, but she did not seem to take that advice  2. 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 Benign paroxysmal positional vertigo, unspecified laterality -improved after therapy, likes to keep meclizine on hand  4. OAB (overactive bladder) -ongoing, myrbetriq did not help, is also still on hctz, but didn't want to come off for fear of edema and compression hose  5. Chronic venous insufficiency -has had this edema, elevate feet at rest, could use compression hose, but would be hard for her to put them on with her arthritis and flexibility issues  6. Essential hypertension, benign -bp adequately controlled with current regimen, cont same at her request - CBC with Differential/Platelet; Future - COMPLETE METABOLIC PANEL WITH GFR; Future  7. Rheumatoid arthritis of multiple sites with negative rheumatoid factor (HCC) -follows with Dr. Estanislado Pandy, remains on low dose prednisone  8. Need for influenza vaccination - Flu vaccine HIGH DOSE PF (Fluzone High dose) givne  9. Hyperlipidemia associated with type 2 diabetes mellitus ([place]) -remains on low dose of statin therapy, f/u labs and if LDL quite low, might d/c due to her weakness and instability - CBC with Differential/Platelet; Future - COMPLETE METABOLIC PANEL WITH GFR; Future - Hemoglobin A1c; Future - Lipid panel; Future  Labs/tests ordered:   Orders  Placed This Encounter  Procedures  . Flu vaccine HIGH DOSE PF (Fluzone High dose)  . CBC with Differential/Platelet    Standing Status:   Future    Standing Expiration Date:   05/18/2018  . COMPLETE METABOLIC PANEL WITH GFR    Standing Status:   Future    Standing Expiration Date:   05/18/2018  . Hemoglobin A1c    Standing Status:   Future    Standing Expiration Date:   05/18/2018  . Lipid panel    Standing Status:   Future    Standing Expiration Date:   05/18/2018    Next appt: 6 mos for annual physical, labs before  Amarilys Lyles L. Smith Mcnicholas, D.O. [facility] [address], [place] [address] Cell Phone (Mon-Fri 8am-5pm):  [phone] On Call:  [phone] & follow prompts after 5pm & weekends Office Phone:  [phone] Office Fax:  [phone]

## 2017-07-14 ENCOUNTER — Other Ambulatory Visit: Payer: Self-pay | Admitting: *Deleted

## 2017-07-14 MED ORDER — SIMVASTATIN 20 MG PO TABS: ORAL_TABLET | ORAL | 3 refills | Status: DC

## 2017-07-14 NOTE — Telephone Encounter (Signed)
[facility]

## 2017-07-21 DIAGNOSIS — M5416 Radiculopathy, lumbar region: Secondary | ICD-10-CM | POA: Diagnosis not present

## 2017-07-21 DIAGNOSIS — E663 Overweight: Secondary | ICD-10-CM | POA: Diagnosis not present

## 2017-07-21 DIAGNOSIS — M1990 Unspecified osteoarthritis, unspecified site: Secondary | ICD-10-CM | POA: Diagnosis not present

## 2017-07-21 DIAGNOSIS — Z6826 Body mass index (BMI) 26.0-26.9, adult: Secondary | ICD-10-CM | POA: Diagnosis not present

## 2017-07-29 DIAGNOSIS — Z961 Presence of intraocular lens: Secondary | ICD-10-CM | POA: Diagnosis not present

## 2017-07-29 DIAGNOSIS — H401132 Primary open-angle glaucoma, bilateral, moderate stage: Secondary | ICD-10-CM | POA: Diagnosis not present

## 2017-07-29 DIAGNOSIS — H43813 Vitreous degeneration, bilateral: Secondary | ICD-10-CM | POA: Diagnosis not present

## 2017-10-13 ENCOUNTER — Other Ambulatory Visit: Payer: Self-pay | Admitting: *Deleted

## 2017-10-13 MED ORDER — FLUOXETINE HCL 20 MG PO TABS: ORAL_TABLET | ORAL | 1 refills | Status: DC

## 2017-10-13 NOTE — Telephone Encounter (Signed)
[facility] 

## 2017-10-16 DIAGNOSIS — D0339 Melanoma in situ of other parts of face: Secondary | ICD-10-CM | POA: Diagnosis not present

## 2017-10-16 DIAGNOSIS — L821 Other seborrheic keratosis: Secondary | ICD-10-CM | POA: Diagnosis not present

## 2017-10-16 DIAGNOSIS — D2262 Melanocytic nevi of left upper limb, including shoulder: Secondary | ICD-10-CM | POA: Diagnosis not present

## 2017-10-16 DIAGNOSIS — Z85828 Personal history of other malignant neoplasm of skin: Secondary | ICD-10-CM | POA: Diagnosis not present

## 2017-10-16 DIAGNOSIS — D225 Melanocytic nevi of trunk: Secondary | ICD-10-CM | POA: Diagnosis not present

## 2017-10-20 DIAGNOSIS — E663 Overweight: Secondary | ICD-10-CM | POA: Diagnosis not present

## 2017-10-20 DIAGNOSIS — Z6826 Body mass index (BMI) 26.0-26.9, adult: Secondary | ICD-10-CM | POA: Diagnosis not present

## 2017-10-20 DIAGNOSIS — M1990 Unspecified osteoarthritis, unspecified site: Secondary | ICD-10-CM | POA: Diagnosis not present

## 2017-10-20 DIAGNOSIS — M5416 Radiculopathy, lumbar region: Secondary | ICD-10-CM | POA: Diagnosis not present

## 2017-10-26 ENCOUNTER — Encounter: Payer: Self-pay | Admitting: Internal Medicine

## 2017-10-26 DIAGNOSIS — Z85828 Personal history of other malignant neoplasm of skin: Secondary | ICD-10-CM | POA: Diagnosis not present

## 2017-10-26 DIAGNOSIS — D0339 Melanoma in situ of other parts of face: Secondary | ICD-10-CM | POA: Diagnosis not present

## 2017-10-27 DIAGNOSIS — D0339 Melanoma in situ of other parts of face: Secondary | ICD-10-CM | POA: Diagnosis not present

## 2017-11-16 HISTORY — PX: MELANOMA EXCISION: SHX5266

## 2017-11-24 ENCOUNTER — Other Ambulatory Visit: Payer: Medicare Other

## 2017-11-24 DIAGNOSIS — E1169 Type 2 diabetes mellitus with other specified complication: Secondary | ICD-10-CM

## 2017-11-24 DIAGNOSIS — I10 Essential (primary) hypertension: Secondary | ICD-10-CM | POA: Diagnosis not present

## 2017-11-24 DIAGNOSIS — E785 Hyperlipidemia, unspecified: Principal | ICD-10-CM

## 2017-11-25 LAB — LIPID PANEL
Cholesterol: 231 mg/dL — ABNORMAL HIGH (ref <200)
HDL: 75 mg/dL (ref >50)
LDL Cholesterol (Calc): 125 mg/dL (calc) — ABNORMAL HIGH
Non-HDL Cholesterol (Calc): 156 mg/dL (calc) — ABNORMAL HIGH (ref <130)
Total CHOL/HDL Ratio: 3.1 (calc) (ref <5.0)
Triglycerides: 194 mg/dL — ABNORMAL HIGH (ref <150)

## 2017-11-25 LAB — COMPLETE METABOLIC PANEL WITH GFR
AG Ratio: 2.2 (calc) (ref 1.0–2.5)
ALT: 12 U/L (ref 6–29)
AST: 16 U/L (ref 10–35)
Albumin: 4.4 g/dL (ref 3.6–5.1)
Alkaline phosphatase (APISO): 56 U/L (ref 33–130)
BUN: 19 mg/dL (ref 7–25)
CO2: 32 mmol/L (ref 20–32)
Calcium: 10 mg/dL (ref 8.6–10.4)
Chloride: 102 mmol/L (ref 98–110)
Creat: 0.78 mg/dL (ref 0.60–0.88)
GFR, Est African American: 79 mL/min/{1.73_m2} (ref >=60)
GFR, Est Non African American: 68 mL/min/{1.73_m2} (ref >=60)
Globulin: 2 g/dL (calc) (ref 1.9–3.7)
Glucose, Bld: 94 mg/dL (ref 65–99)
Potassium: 4.3 mmol/L (ref 3.5–5.3)
Sodium: 141 mmol/L (ref 135–146)
Total Bilirubin: 0.5 mg/dL (ref 0.2–1.2)
Total Protein: 6.4 g/dL (ref 6.1–8.1)

## 2017-11-25 LAB — CBC WITH DIFFERENTIAL/PLATELET
Basophils Absolute: 49 cells/uL (ref 0–200)
Basophils Relative: 0.7 %
Eosinophils Absolute: 182 cells/uL (ref 15–500)
Eosinophils Relative: 2.6 %
HCT: 40.9 % (ref 35.0–45.0)
Hemoglobin: 13.6 g/dL (ref 11.7–15.5)
Lymphs Abs: 1897 cells/uL (ref 850–3900)
MCH: 28.9 pg (ref 27.0–33.0)
MCHC: 33.3 g/dL (ref 32.0–36.0)
MCV: 87 fL (ref 80.0–100.0)
MPV: 9.9 fL (ref 7.5–12.5)
Monocytes Relative: 10 %
Neutro Abs: 4172 cells/uL (ref 1500–7800)
Neutrophils Relative %: 59.6 %
Platelets: 271 10*3/uL (ref 140–400)
RBC: 4.7 10*6/uL (ref 3.80–5.10)
RDW: 12.8 % (ref 11.0–15.0)
Total Lymphocyte: 27.1 %
WBC mixed population: 700 cells/uL (ref 200–950)
WBC: 7 10*3/uL (ref 3.8–10.8)

## 2017-11-25 LAB — HEMOGLOBIN A1C
Hgb A1c MFr Bld: 6 % of total Hgb — ABNORMAL HIGH (ref <5.7)
Mean Plasma Glucose: 126 (calc)
eAG (mmol/L): 7 (calc)

## 2017-11-26 ENCOUNTER — Encounter: Payer: Self-pay | Admitting: Internal Medicine

## 2017-11-26 ENCOUNTER — Ambulatory Visit (INDEPENDENT_AMBULATORY_CARE_PROVIDER_SITE_OTHER): Payer: Medicare Other | Admitting: Internal Medicine

## 2017-11-26 VITALS — BP 118/72 | HR 66 | Temp 98.1°F | Ht 60.0 in | Wt 134.6 lb

## 2017-11-26 DIAGNOSIS — Z0000 Encounter for general adult medical examination without abnormal findings: Secondary | ICD-10-CM | POA: Diagnosis not present

## 2017-11-26 DIAGNOSIS — E781 Pure hyperglyceridemia: Secondary | ICD-10-CM

## 2017-11-26 DIAGNOSIS — I10 Essential (primary) hypertension: Secondary | ICD-10-CM

## 2017-11-26 DIAGNOSIS — R739 Hyperglycemia, unspecified: Secondary | ICD-10-CM | POA: Diagnosis not present

## 2017-11-26 DIAGNOSIS — Z7189 Other specified counseling: Secondary | ICD-10-CM | POA: Diagnosis not present

## 2017-11-26 DIAGNOSIS — M0609 Rheumatoid arthritis without rheumatoid factor, multiple sites: Secondary | ICD-10-CM | POA: Diagnosis not present

## 2017-11-26 DIAGNOSIS — C4339 Malignant melanoma of other parts of face: Secondary | ICD-10-CM | POA: Diagnosis not present

## 2017-11-26 DIAGNOSIS — M0550 Rheumatoid polyneuropathy with rheumatoid arthritis of unspecified site: Secondary | ICD-10-CM

## 2017-11-26 NOTE — Progress Notes (Signed)
Provider:  Rexene Edison. Mariea Clonts, D.O., C.M.D. Location:   [place]  Place of Service:   clinic  Previous PCP: Gayland Curry, DO Patient Care Team: Gayland Curry, DO as PCP - General (Geriatric Medicine) Warden Fillers, MD as Consulting Physician (Ophthalmology) Griselda Miner, MD as Consulting Physician (Dermatology)  Extended Emergency Contact Information Primary Emergency Contact: Baumann,Erin Address: [address]          [place] [address] [place] Phone: [phone] Relation: Daughter  Code Status: DNR Goals of Care: Advanced Directive information Advanced Directives 01/29/2017  Does Patient Have a Medical Advance Directive? Yes  Type of Paramedic of [place];Living will  Does patient want to make changes to medical advance directive? -  Copy of [place] in Chart? -  Would patient like information on creating a medical advance directive? -   Chief Complaint  Patient presents with  . Annual Exam    CPE; 1 month ago surgery by Dr Sarajane Jews (forehead had 32 stitches); Fell 1 week ago in kitchen with no injuries  . Medication Refill    No Refills    HPI: Patient is a 84 y.o. female seen today for an annual physical exam.  Reviewed labs.  Sugar average and cholesterol have trended up.  Sugar average is 6 which is still great for an 84 yo.   Sugar and Cholesterol up due to breyer's ice cream being on sale.  Only other change since last visit:  Had left temple melatonin excised.  She reports that margins were clear when Dr. Sarajane Jews did her surgery at [facility] dermatology.  I need notes from him and CMA called to request them.  She fell and hit her head on her kitchen cabinets, could not get up off the floor, and used her life alert button and got picked up.  No major injury.  EKG could not be done as machine could not connect to the internet today.  Past Medical History:  Diagnosis Date  . Arthritis      Osteoarthritis  . Depression   . GERD (gastroesophageal reflux disease)   . Hyperlipidemia   . Osteopenia    Past Surgical History:  Procedure Laterality Date  . APPENDECTOMY  1936  . CERVICAL FUSION  2004  . COLON SURGERY  06/12/2010   Colonoscopy  . FINGER SURGERY  1989  . SPINE SURGERY  2009   Back Fusion  . TONSILLECTOMY  1941    reports that she has quit smoking. She has never used smokeless tobacco. She reports that she does not drink alcohol or use drugs.  Functional Status Survey:    History reviewed. No pertinent family history.  Health Maintenance  Topic Date Due  . FOOT EXAM  02/12/1937  . OPHTHALMOLOGY EXAM  02[birth year]12/1937  . INFLUENZA VACCINE  03/18/2018  . HEMOGLOBIN A1C  05/26/2018  . TETANUS/TDAP  03/03/2024  . DEXA SCAN  Completed  . PNA vac Low Risk Adult  Completed    Allergies  Allergen Reactions  . Penicillins Rash    Outpatient Encounter Medications as of 11/26/2017  Medication Sig  . alendronate (FOSAMAX) 70 MG tablet Take one tablet by mouth weekly.  Stay upright for 1 hour after taking.  . cholecalciferol (VITAMIN D) 1000 UNITS tablet Take 1,000 Units by mouth 2 (two) times daily.   . dorzolamide-timolol (COSOPT) 22.3-6.8 MG/ML ophthalmic solution Place 1 drop into both eyes daily.  Marland Kitchen FLUoxetine (PROZAC) 20 MG tablet Take one  tablet by mouth once daily  . gabapentin (NEURONTIN) 300 MG capsule   . latanoprost (XALATAN) 0.005 % ophthalmic solution Place 1 drop into both eyes at bedtime.   Marland Kitchen lisinopril-hydrochlorothiazide (PRINZIDE,ZESTORETIC) 20-12.5 MG tablet Take 1 tablet by mouth daily.  . meclizine (ANTIVERT) 12.5 MG tablet Take 1 tablet (12.5 mg total) by mouth 3 (three) times daily as needed for dizziness.  Marland Kitchen oxyCODONE-acetaminophen (PERCOCET/ROXICET) 5-325 MG per tablet Take 1 tablet by mouth 3 (three) times daily as needed for pain.   . pantoprazole (PROTONIX) 40 MG tablet Take one tablet by mouth once daily for acid reflux  .  predniSONE (DELTASONE) 1 MG tablet 3 tablets Once a day Orally 90 days  . Probiotic Product (ALIGN PO) Take by mouth daily.  . simvastatin (ZOCOR) 20 MG tablet Take one tablet by mouth once daily for choelsterol   No facility-administered encounter medications on file as of 11/26/2017.     Review of Systems  Constitutional: Negative for chills and fever.  HENT: Positive for hearing loss. Negative for congestion.   Eyes: Positive for blurred vision.  Respiratory: Negative for shortness of breath.   Cardiovascular: Negative for chest pain, palpitations and leg swelling.  Gastrointestinal: Negative for abdominal pain, blood in stool, constipation and melena.  Genitourinary: Positive for urgency. Negative for dysuria.  Musculoskeletal: Positive for back pain, falls, joint pain and myalgias.  Skin: Negative for itching and rash.       Left temple melanoma resection--incision well healed  Neurological: Positive for tingling and sensory change. Negative for dizziness and loss of consciousness.  Endo/Heme/Allergies: Bruises/bleeds easily.  Psychiatric/Behavioral: Negative for depression and memory loss. The patient is not nervous/anxious and does not have insomnia.     Vitals:   11/26/17 0919  BP: 118/72  Pulse: 66  Temp: 98.1 F (36.7 C)  TempSrc: Oral  SpO2: 92%  Weight: 134 lb 9.6 oz (61.1 kg)  Height: 5' (1.524 m)   Body mass index is 26.29 kg/m. Physical Exam  Constitutional: She is oriented to person, place, and time. She appears well-developed. No distress.  HENT:  Head: Normocephalic and atraumatic.  Right Ear: External ear normal.  Left Ear: External ear normal.  Nose: Nose normal.  Mouth/Throat: Oropharynx is clear and moist. No oropharyngeal exudate.  Eyes: Pupils are equal, round, and reactive to light. Conjunctivae and EOM are normal.  Neck: Normal range of motion. Neck supple. No thyromegaly present.  Cardiovascular: Normal rate, regular rhythm, normal heart sounds  and intact distal pulses.  Pulmonary/Chest: Effort normal and breath sounds normal. No respiratory distress. Right breast exhibits no inverted nipple, no mass, no nipple discharge, no skin change and no tenderness. Left breast exhibits no inverted nipple, no mass, no nipple discharge, no skin change and no tenderness.  Abdominal: Soft. Bowel sounds are normal. She exhibits no distension. There is no tenderness.  Musculoskeletal: Normal range of motion. She exhibits no tenderness.  Uses cane  Lymphadenopathy:    She has no cervical adenopathy.  Neurological: She is alert and oriented to person, place, and time. She displays normal reflexes. A sensory deficit is present. No cranial nerve deficit.  Skin: Skin is warm and dry. Capillary refill takes less than 2 seconds.  Psychiatric: She has a normal mood and affect. Her behavior is normal. Judgment and thought content normal.    Labs reviewed: Basic Metabolic Panel: Recent Labs    12/02/16 1012 11/24/17 1017  NA 141 141  K 4.2 4.3  CL 104 102  CO2 24 32  GLUCOSE 97 94  BUN 19 19  CREATININE 0.80 0.78  CALCIUM 9.8 10.0   Liver Function Tests: Recent Labs    12/02/16 1012 11/24/17 1017  AST 17 16  ALT 15 12  ALKPHOS 43  --   BILITOT 0.3 0.5  PROT 6.3 6.4  ALBUMIN 4.0  --    No results for input(s): LIPASE, AMYLASE in the last 8760 hours. No results for input(s): AMMONIA in the last 8760 hours. CBC: Recent Labs    12/02/16 1012 11/24/17 1017  WBC 5.9 7.0  NEUTROABS 3,009 4,172  HGB 12.1 13.6  HCT 37.4 40.9  MCV 85.2 87.0  PLT 242 271   Cardiac Enzymes: No results for input(s): CKTOTAL, CKMB, CKMBINDEX, TROPONINI in the last 8760 hours. BNP: Invalid input(s): POCBNP Lab Results  Component Value Date   HGBA1C 6.0 (H) 11/24/2017   Lab Results  Component Value Date   TSH 0.32 (L) 07/25/2016   Lab Results  Component Value Date   VITAMINB12 955 05/01/2016    Assessment/Plan 1. Annual physical exam -  performed today - CBC with Differential/Platelet; Future - COMPLETE METABOLIC PANEL WITH GFR; Future - Lipid panel; Future - Hemoglobin A1c; Future  2. Advance care planning - Do not attempt resuscitation (DNR) is on file and HCPOA  3. Essential hypertension, benign -bp at goal with current therapy, cont same and monitor  4. Rheumatoid arthritis of multiple sites with negative rheumatoid factor ([place]) -cont current mgt per rheumatology  5. 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) (HCC) -cont gabapentin therapy for neuropathic pain  6. Malignant melanoma of left temple [facility]) -s/p resection, requested note from Dr. Sarajane Jews who performed procedure for her -reports she is getting every 3 or 6 month skin checks now and margins were clear, but I want the documentation in her file  7. Hypertriglyceridemia, essential - worse due to intake of breyer's ice cream--recommended cutting down on amount and using it as a treat - Lipid panel; Future  8. Hyperglycemia - also up with ice cream intake, still at goal for her age, monitor - Hemoglobin A1c; Future  Labs/tests ordered:   Orders Placed This Encounter  Procedures  . CBC with Differential/Platelet    Standing Status:   Future    Standing Expiration Date:   07/28/2018  . COMPLETE METABOLIC PANEL WITH GFR    Standing Status:   Future    Standing Expiration Date:   07/28/2018  . Lipid panel    Standing Status:   Future    Standing Expiration Date:   07/28/2018  . Hemoglobin A1c    Standing Status:   Future    Standing Expiration Date:   07/28/2018  . Do not attempt resuscitation (DNR)    Discussed at clinic visit, scanned copy should be in documents and media    Order Specific Question:   In the event of cardiac or respiratory ARREST    Answer:   Do not call a "code blue"    Order Specific Question:   In the event of cardiac or respiratory ARREST    Answer:   Do not perform Intubation, CPR, defibrillation or ACLS    Order  Specific Question:   In the event of cardiac or respiratory ARREST    Answer:   Use medication by any route, position, wound care, and other measures to relive pain and suffering. May use oxygen, suction and manual treatment of airway obstruction as needed for comfort.   F/u  4 mos med mgt, labs before  Kalvyn Desa L. Liddy Deam, D.O. [facility] [address], [place] [address] Cell Phone (Mon-Fri 8am-5pm):  [phone] On Call:  [phone] & follow prompts after 5pm & weekends Office Phone:  [phone] Office Fax:  [phone]

## 2018-01-05 ENCOUNTER — Telehealth (INDEPENDENT_AMBULATORY_CARE_PROVIDER_SITE_OTHER): Payer: Self-pay | Admitting: Specialist

## 2018-01-05 NOTE — Telephone Encounter (Signed)
Patient needing to be added to cancellation list for Dr. Louanne Skye, she said he did back/neck surgery a while ago and that she is starting to have back pain again. Her # [phone]

## 2018-01-06 NOTE — Telephone Encounter (Signed)
I out her on the cancellation list 

## 2018-01-07 ENCOUNTER — Telehealth: Payer: Self-pay

## 2018-01-07 NOTE — Telephone Encounter (Signed)
Patient scheduled for 8:45 in the morning.

## 2018-01-07 NOTE — Telephone Encounter (Signed)
Patient stated that she has been having an odd pain in her buttocks that runs around to the upper thighs for several days. Pt stated that she was given a prednisone dose pack by rheumatology to treat inflammation but pt wonders if the prednisone can cause urinary symptoms. Pt has only taken 2 doses of the prednisone.   Pt stated that she is have increased urinary frequency for the last day or so. Pt stated that she had to get up numerous times to change last night and then again several times earlier today.   Pt would like any recommendations as to what to do

## 2018-01-07 NOTE — Telephone Encounter (Signed)
Patient called back upset because she has not received a response from Dr. Mariea Clonts. I tried to explain that Dr. Mariea Clonts was seeing patient's in office all day and she stated that she is a patient too and demands a call directly from Dr. Mariea Clonts.

## 2018-01-07 NOTE — Telephone Encounter (Signed)
Pt needs an appointment for evaluation in case she has a problem in her back producing both of these symptoms (numbness in thighs and urinary frequency, loss of control).  Ideally she should be seen in the morning, but if not possible, I recommend she be seen in the [facility] [place] urgent care.

## 2018-01-08 ENCOUNTER — Ambulatory Visit (INDEPENDENT_AMBULATORY_CARE_PROVIDER_SITE_OTHER): Payer: Medicare Other | Admitting: Internal Medicine

## 2018-01-08 ENCOUNTER — Encounter: Payer: Self-pay | Admitting: Internal Medicine

## 2018-01-08 VITALS — BP 116/60 | HR 77 | Temp 98.4°F | Resp 18 | Ht 60.0 in | Wt 134.8 lb

## 2018-01-08 DIAGNOSIS — M7061 Trochanteric bursitis, right hip: Secondary | ICD-10-CM | POA: Diagnosis not present

## 2018-01-08 DIAGNOSIS — R350 Frequency of micturition: Secondary | ICD-10-CM | POA: Diagnosis not present

## 2018-01-08 DIAGNOSIS — M0609 Rheumatoid arthritis without rheumatoid factor, multiple sites: Secondary | ICD-10-CM | POA: Diagnosis not present

## 2018-01-08 DIAGNOSIS — M5431 Sciatica, right side: Secondary | ICD-10-CM | POA: Diagnosis not present

## 2018-01-08 LAB — POCT URINALYSIS DIPSTICK
Bilirubin, UA: NEGATIVE
Blood, UA: NEGATIVE
Glucose, UA: NEGATIVE
Leukocytes, UA: NEGATIVE
Nitrite, UA: NEGATIVE
Protein, UA: NEGATIVE
Spec Grav, UA: 1.01 (ref 1.010–1.025)
Urobilinogen, UA: 0.2 E.U./dL
pH, UA: 7 (ref 5.0–8.0)

## 2018-01-08 NOTE — Progress Notes (Signed)
Patient ID: [name], female   DOB: Apr 21, 1927, 84 y.o.   MRN: [id number]   [facility] OFFICE  Provider: DR Arletha Grippe  Code Status:  Goals of Care:  Advanced Directives 01/29/2017  Does Patient Have a Medical Advance Directive? Yes  Type of Paramedic of [place];Living will  Does patient want to make changes to medical advance directive? -  Copy of Reform in Chart? -  Would patient like information on creating a medical advance directive? -     Chief Complaint  Patient presents with  . Acute Visit    Urinary frequency that started 2 nights ago. She mentioned that she's having flank pain on both sides right more thatn left.   . Medication Refill    No refills needed at this time    HPI: Patient is a 84 y.o. female seen today for an acute visit for 3 day hx increased urinary frequency and urgency with back pain. No hematuria, dysuria. She has had 10 time per night nocturia. No N/V, abdominal pain, f/c.  She has never had a UTI. She was seen by rheumatology 3 days ago and Rx prednisone taper for back pain. She has a hx RA.   Past Medical History:  Diagnosis Date  . Arthritis    Osteoarthritis  . Depression   . GERD (gastroesophageal reflux disease)   . Hyperlipidemia   . Osteopenia     Past Surgical History:  Procedure Laterality Date  . APPENDECTOMY  1936  . CERVICAL FUSION  2004  . COLON SURGERY  06/12/2010   Colonoscopy  . FINGER SURGERY  1989  . SPINE SURGERY  2009   Back Fusion  . TONSILLECTOMY  1941     reports that she has quit smoking. She has never used smokeless tobacco. She reports that she does not drink alcohol or use drugs. Social History   Socioeconomic History  . Marital status: Divorced    Spouse name: Not on file  . Number of children: Not on file  . Years of education: Not on file  . Highest education level: Not on file  Occupational History  . Not on file  Social Needs  . Financial resource  strain: Not on file  . Food insecurity:    Worry: Not on file    Inability: Not on file  . Transportation needs:    Medical: Not on file    Non-medical: Not on file  Tobacco Use  . Smoking status: Former Research scientist (life sciences)  . Smokeless tobacco: Never Used  Substance and Sexual Activity  . Alcohol use: No    Alcohol/week: 0.0 oz  . Drug use: No  . Sexual activity: Never  Lifestyle  . Physical activity:    Days per week: Not on file    Minutes per session: Not on file  . Stress: Not on file  Relationships  . Social connections:    Talks on phone: Not on file    Gets together: Not on file    Attends religious service: Not on file    Active member of club or organization: Not on file    Attends meetings of clubs or organizations: Not on file    Relationship status: Not on file  . Intimate partner violence:    Fear of current or ex partner: Not on file    Emotionally abused: Not on file    Physically abused: Not on file    Forced sexual activity: Not on  file  Other Topics Concern  . Not on file  Social History Narrative  . Not on file    History reviewed. No pertinent family history.  Allergies  Allergen Reactions  . Penicillins Rash    Outpatient Encounter Medications as of 01/08/2018  Medication Sig  . alendronate (FOSAMAX) 70 MG tablet Take one tablet by mouth weekly.  Stay upright for 1 hour after taking.  . cholecalciferol (VITAMIN D) 1000 UNITS tablet Take 1,000 Units by mouth 2 (two) times daily.   . dorzolamide-timolol (COSOPT) 22.3-6.8 MG/ML ophthalmic solution Place 1 drop into both eyes daily.  Marland Kitchen FLUoxetine (PROZAC) 20 MG tablet Take one tablet by mouth once daily  . gabapentin (NEURONTIN) 300 MG capsule Take 600 mg by mouth at bedtime.   Marland Kitchen latanoprost (XALATAN) 0.005 % ophthalmic solution Place 1 drop into both eyes at bedtime.   Marland Kitchen lisinopril-hydrochlorothiazide (PRINZIDE,ZESTORETIC) 20-12.5 MG tablet Take 1 tablet by mouth daily.  . meclizine (ANTIVERT) 12.5 MG  tablet Take 1 tablet (12.5 mg total) by mouth 3 (three) times daily as needed for dizziness.  Marland Kitchen oxyCODONE-acetaminophen (PERCOCET/ROXICET) 5-325 MG per tablet Take 1 tablet by mouth 3 (three) times daily as needed for pain.   . pantoprazole (PROTONIX) 40 MG tablet Take one tablet by mouth once daily for acid reflux  . predniSONE (DELTASONE) 1 MG tablet 3 tablets Once a day Orally 90 days  . Probiotic Product (ALIGN PO) Take by mouth daily.  . simvastatin (ZOCOR) 20 MG tablet Take one tablet by mouth once daily for choelsterol   No facility-administered encounter medications on file as of 01/08/2018.     Review of Systems:  Review of Systems  Genitourinary: Positive for frequency and urgency. Negative for dysuria and hematuria.  Musculoskeletal: Positive for arthralgias, back pain and gait problem.  All other systems reviewed and are negative.   Health Maintenance  Topic Date Due  . FOOT EXAM  02/12/1939  . OPHTHALMOLOGY EXAM  02/12/1939  . INFLUENZA VACCINE  03/18/2018  . HEMOGLOBIN A1C  05/26/2018  . TETANUS/TDAP  03/03/2024  . DEXA SCAN  Completed  . PNA vac Low Risk Adult  Completed    Physical Exam: Vitals:   01/08/18 0850  BP: 116/60  Pulse: 77  Resp: 18  Temp: 98.4 F (36.9 C)  TempSrc: Oral  SpO2: 96%  Weight: 134 lb 12.8 oz (61.1 kg)  Height: 5' (1.524 m)   Body mass index is 26.33 kg/m. Physical Exam  Constitutional: She is oriented to person, place, and time.  Cardiovascular: Normal rate, regular rhythm and intact distal pulses. Exam reveals no gallop and no friction rub.  Murmur (1/6 SEM) heard. No LE swelling. No calf TTP  Pulmonary/Chest: Effort normal and breath sounds normal. No stridor. No respiratory distress. She has no wheezes. She has no rales.  Abdominal: Soft. Bowel sounds are normal. She exhibits no distension and no mass. There is tenderness (suprapubic). There is no rebound and no guarding. No hernia.  Obese; no CVAT  Musculoskeletal: She  exhibits edema and tenderness.       Lumbar back: She exhibits decreased range of motion, tenderness and spasm.       Back:  Right piriformis TTP; right greater trochanteric TTP; right SI joint restriction; paravertebral lumbar muscle hypertrophy with ropy tissue texture changes; reduced right hip internal rotation  Neurological: She is alert and oriented to person, place, and time. Gait (antalgic; uses cane) abnormal.  Skin: Skin is warm and dry. No rash noted.  Psychiatric: She has a normal mood and affect. Her behavior is normal. Judgment and thought content normal.    Labs reviewed: Basic Metabolic Panel: Recent Labs    11/24/17 1017  NA 141  K 4.3  CL 102  CO2 32  GLUCOSE 94  BUN 19  CREATININE 0.78  CALCIUM 10.0   Liver Function Tests: Recent Labs    11/24/17 1017  AST 16  ALT 12  BILITOT 0.5  PROT 6.4   No results for input(s): LIPASE, AMYLASE in the last 8760 hours. No results for input(s): AMMONIA in the last 8760 hours. CBC: Recent Labs    11/24/17 1017  WBC 7.0  NEUTROABS 4,172  HGB 13.6  HCT 40.9  MCV 87.0  PLT 271   Lipid Panel: Recent Labs    11/24/17 1017  CHOL 231*  HDL 75  LDLCALC 125*  TRIG 194*  CHOLHDL 3.1   Lab Results  Component Value Date   HGBA1C 6.0 (H) 11/24/2017    Procedures since last visit: No results found.  Assessment/Plan   ICD-10-CM   1. Urinary frequency R35.0 Culture, Urine    POCT urinalysis dipstick  2. Greater trochanteric bursitis of right hip M70.61   3. Rheumatoid arthritis of multiple sites with negative rheumatoid factor (HCC) M06.09   4. Right sided sciatica M54.31     Check urine dipstick - neg   Send urine cx and s and will tx accordingly  Drink plenty of water   Finish prednisone  Continue other medications as ordered  Follow up with specialists as scheduled  Follow up as scheduled with Dr Mariea Clonts or sooner if need be.  Danita Proud S. Perlie Gold  [facility] [address] [place], [place] [address] [phone] Cell (Monday-Friday 8 AM - 5 PM) [phone] After 5 PM and follow prompts

## 2018-01-08 NOTE — Patient Instructions (Signed)
Will call with urine culture results  Drink plenty of water   Finish prednisone  Continue other medications as ordered  Follow up with specialists as scheduled  Follow up as scheduled with Dr Mariea Clonts or sooner if need be.

## 2018-01-09 LAB — URINE CULTURE
MICRO NUMBER:: 90635232
SPECIMEN QUALITY:: ADEQUATE

## 2018-01-20 DIAGNOSIS — C44319 Basal cell carcinoma of skin of other parts of face: Secondary | ICD-10-CM | POA: Diagnosis not present

## 2018-01-20 DIAGNOSIS — Z85828 Personal history of other malignant neoplasm of skin: Secondary | ICD-10-CM | POA: Diagnosis not present

## 2018-01-20 DIAGNOSIS — L570 Actinic keratosis: Secondary | ICD-10-CM | POA: Diagnosis not present

## 2018-01-20 DIAGNOSIS — D485 Neoplasm of uncertain behavior of skin: Secondary | ICD-10-CM | POA: Diagnosis not present

## 2018-01-20 DIAGNOSIS — L814 Other melanin hyperpigmentation: Secondary | ICD-10-CM | POA: Diagnosis not present

## 2018-01-20 DIAGNOSIS — Z85820 Personal history of malignant melanoma of skin: Secondary | ICD-10-CM | POA: Diagnosis not present

## 2018-01-22 DIAGNOSIS — H401132 Primary open-angle glaucoma, bilateral, moderate stage: Secondary | ICD-10-CM | POA: Diagnosis not present

## 2018-01-22 DIAGNOSIS — Z961 Presence of intraocular lens: Secondary | ICD-10-CM | POA: Diagnosis not present

## 2018-01-22 DIAGNOSIS — H43813 Vitreous degeneration, bilateral: Secondary | ICD-10-CM | POA: Diagnosis not present

## 2018-01-25 ENCOUNTER — Ambulatory Visit (INDEPENDENT_AMBULATORY_CARE_PROVIDER_SITE_OTHER): Payer: Medicare Other

## 2018-01-25 ENCOUNTER — Encounter (INDEPENDENT_AMBULATORY_CARE_PROVIDER_SITE_OTHER): Payer: Self-pay | Admitting: Specialist

## 2018-01-25 ENCOUNTER — Ambulatory Visit (INDEPENDENT_AMBULATORY_CARE_PROVIDER_SITE_OTHER): Payer: Medicare Other | Admitting: Specialist

## 2018-01-25 VITALS — BP 110/76 | HR 94 | Ht 60.0 in | Wt 136.0 lb

## 2018-01-25 DIAGNOSIS — M5441 Lumbago with sciatica, right side: Secondary | ICD-10-CM | POA: Diagnosis not present

## 2018-01-25 NOTE — Progress Notes (Signed)
Office Visit Note   Patient: [name]           Date of Birth: 21-Oct-1926           MRN: [id number] Visit Date: 01/25/2018              Requested by: Gayland Curry, DO [place], [place] [address] PCP: Gayland Curry, DO   Assessment & Plan: Visit Diagnoses:  1. Right-sided low back pain with right-sided sciatica, unspecified chronicity     Plan: Patient seen with Dr. Louanne Skye today.  Since she has failed conservative treatment at this point we will schedule her for right L2-3 and right L3-4 transforaminal ESI.  Follow-up in 2 weeks for recheck.  If she does not get significant improvement with those injections we will plan to get further imaging studies with MRI versus CT.  Dr. Louanne Skye did advise patient to increase her Neurontin to 300 mg in the morning and she will continue her current dose with 600 mg at night.  Patient understands that this could potentially make her dizzy and she will be careful.  She is also taking Percocet from her rheumatologist.  All questions answered.  Follow-Up Instructions: Return in about 2 weeks (around 02/08/2018) for 2 weeks with Dr. Louanne Skye.   Orders:  Orders Placed This Encounter  Procedures  . XR Lumbar Spine Complete   No orders of the defined types were placed in this encounter.     Procedures: No procedures performed   Clinical Data: No additional findings.   Subjective: Chief Complaint  Patient presents with  . Lower Back - Pain    HPI 84 year old white female comes into the office today with complaints of worsening right low back/buttock pain and right lower extremity radiculopathy.  Patient is status post L4-5 and L5-S1 fusion by Dr. Louanne Skye 2009.  She was last seen in our office 2017 and had lumbar ESI with Dr. Ernestina Patches.  Over the last couple years she has been doing really well with occasional aches and pains in her back and leg but this is been worse for almost 2 months.  Increased pain in the right buttock more than  left but she is also been having pain shooting down to the right lateral calf.  Some numbness and tingling in her legs.  Patient takes prednisone 1 mg 2 times daily by her rheumatologist.  States that the rheumatologist prescribed a prednisone taper a couple weeks ago and this gave minimal temporary improvement.  Pain with sitting, standing, ambulating.  She uses a cane.  Denies groin pain. Rheumatologist also prescribes Percocet and gabapentin which she takes 600 mg nightly.        Review of Systems No current cardiac pulmonary GI GU issues  Objective: Vital Signs: BP 110/76 (BP Location: Left Arm, Patient Position: Sitting, Cuff Size: Normal)   Pulse 94   Ht 5' (1.524 m)   Wt 136 lb (61.7 kg)   BMI 26.56 kg/m   Physical Exam  Constitutional: She is oriented to person, place, and time. She appears well-developed and well-nourished.  Extremely pleasant elderly white female alert and oriented.  Patient does appear to be a moderate level of discomfort going from a sitting to standing position due to her pain.  Eyes: Pupils are equal, round, and reactive to light. EOM are normal.  Pulmonary/Chest: No respiratory distress.  Musculoskeletal:  Gait is antalgic.  She does have considerable amount of discomfort going from a sitting to  standing position.  Positive lumbar paraspinal tenderness.  She has marked right sciatic notch tenderness.  A little less tender on the left side.  Marked right hip greater trochanter bursa tenderness.  Less tender on the left hip.  Negative logroll.  Right greater than left straight leg raise.  Trace right quad weakness.  Bilateral calves nontender.    Neurological: She is alert and oriented to person, place, and time.  Skin: Skin is warm and dry.    Ortho Exam  Specialty Comments:  No specialty comments available.  Imaging: No results found.   PMFS History: Patient Active Problem List   Diagnosis Date Noted  . Malignant melanoma of left temple ([place])  11/26/2017  . Chronic venous insufficiency 07/26/2015  . Urge incontinence of urine 07/26/2015  . Rheumatoid arthritis involving multiple sites ([place]) 07/26/2015  . Depression 07/26/2015  . Glaucoma 07/26/2015  . Postmenopausal estrogen deficiency 07/26/2015  . Primary open angle glaucoma 01/02/2014  . Hyperglycemia 11/18/2012  . Hypertriglyceridemia, essential 11/18/2012  . Essential hypertension, benign 11/18/2012  . 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 Arthritis    Osteoarthritis  . Depression   . GERD (gastroesophageal reflux disease)   . Hyperlipidemia   . Osteopenia     History reviewed. No pertinent family history.  Past Surgical History:  Procedure Laterality Date  . APPENDECTOMY  1936  . CERVICAL FUSION  2004  . COLON SURGERY  06/12/2010   Colonoscopy  . FINGER SURGERY  1989  . SPINE SURGERY  2009   Back Fusion  . TONSILLECTOMY  1941   Social History   Occupational History  . Not on file  Tobacco Use  . Smoking status: Former Research scientist (life sciences)  . Smokeless tobacco: Never Used  Substance and Sexual Activity  . Alcohol use: No    Alcohol/week: 0.0 oz  . Drug use: No  . Sexual activity: Never

## 2018-01-27 ENCOUNTER — Ambulatory Visit (INDEPENDENT_AMBULATORY_CARE_PROVIDER_SITE_OTHER): Payer: Medicare Other | Admitting: Physical Medicine and Rehabilitation

## 2018-01-27 ENCOUNTER — Ambulatory Visit (INDEPENDENT_AMBULATORY_CARE_PROVIDER_SITE_OTHER): Payer: Medicare Other

## 2018-01-27 VITALS — BP 103/63 | HR 82

## 2018-01-27 DIAGNOSIS — M5416 Radiculopathy, lumbar region: Secondary | ICD-10-CM | POA: Diagnosis not present

## 2018-01-27 MED ORDER — DEXAMETHASONE SODIUM PHOSPHATE 10 MG/ML IJ SOLN
15.0000 mg | Freq: Once | INTRAMUSCULAR | Status: AC
  Administered 2018-01-27: 15 mg

## 2018-01-27 NOTE — Patient Instructions (Signed)

## 2018-01-27 NOTE — Progress Notes (Signed)
 .  Numeric Pain Rating Scale and Functional Assessment Average Pain 8   In the last MONTH (on 0-10 scale) has pain interfered with the following?  1. General activity like being  able to carry out your everyday physical activities such as walking, climbing stairs, carrying groceries, or moving a chair?  Rating(4)   +Driver, -BT, -Dye Allergies.  

## 2018-01-27 NOTE — Procedures (Signed)
Lumbosacral Transforaminal Epidural Steroid Injection - Sub-Pedicular Approach with Fluoroscopic Guidance  Patient: [name]      Date of Birth: 1927/03/28 MRN: [id number] PCP: Gayland Curry, DO      Visit Date: 01/27/2018   Universal Protocol:    Date/Time: 01/27/2018  Consent Given By: the patient  Position: PRONE  Additional Comments: Vital signs were monitored before and after the procedure. Patient was prepped and draped in the usual sterile fashion. The correct patient, procedure, and site was verified.   Injection Procedure Details:  Procedure Site One Meds Administered:  Meds ordered this encounter  Medications  . dexamethasone (DECADRON) injection 15 mg    Laterality: Right  Location/Site:  L2-L3 L3-L4  Needle size: 8 G  Needle type: Spinal  Needle Placement: Transforaminal  Findings:    -Comments: Excellent flow of contrast along the nerve and into the epidural space.  Procedure Details: After squaring off the end-plates to get a true AP view, the C-arm was positioned so that an oblique view of the foramen as noted above was visualized. The target area is just inferior to the "nose of the scotty dog" or sub pedicular. The soft tissues overlying this structure were infiltrated with 2-3 ml. of 1% Lidocaine without Epinephrine.  The spinal needle was inserted toward the target using a "trajectory" view along the fluoroscope beam.  Under AP and lateral visualization, the needle was advanced so it did not puncture dura and was located close the 6 O'Clock position of the pedical in AP tracterory. Biplanar projections were used to confirm position. Aspiration was confirmed to be negative for CSF and/or blood. A 1-2 ml. volume of Isovue-250 was injected and flow of contrast was noted at each level. Radiographs were obtained for documentation purposes.   After attaining the desired flow of contrast documented above, a 0.5 to 1.0 ml test dose of 0.25% Marcaine  was injected into each respective transforaminal space.  The patient was observed for 90 seconds post injection.  After no sensory deficits were reported, and normal lower extremity motor function was noted,   the above injectate was administered so that equal amounts of the injectate were placed at each foramen (level) into the transforaminal epidural space.   Additional Comments:  The patient tolerated the procedure well Dressing: Band-Aid    Post-procedure details: Patient was observed during the procedure. Post-procedure instructions were reviewed.  Patient left the clinic in stable condition.

## 2018-01-28 NOTE — Progress Notes (Signed)
[name] - 84 y.o. female MRN [id number]  Date of birth: 11-Aug-1927  Office Visit Note: Visit Date: 01/27/2018 PCP: Gayland Curry, DO Referred by: Gayland Curry, DO  Subjective: Chief Complaint  Patient presents with  . Lower Back - Pain   HPI: [name] is an 84 year old female with chronic worsening right hip and leg pain.  She recently saw Benjiman Core, PA-C and Dr. Louanne Skye who request right L2 and L3 transforaminal epidural steroid injection diagnostically and therapeutically.  She has had prior lumbar fusion at L4 and L5.  We have seen her in the past and the last time I saw her was in 2017 for sacroiliac joint injection.  She does feel weak in general but has not had any focal weakness or any red flag complaints.  Her history is complicated by history of polyneuropathy as well as rheumatoid arthritis.   ROS Otherwise per HPI.  Assessment & Plan: Visit Diagnoses:  1. Lumbar radiculopathy     Plan: No additional findings.   Meds & Orders:  Meds ordered this encounter  Medications  . dexamethasone (DECADRON) injection 15 mg    Orders Placed This Encounter  Procedures  . XR C-ARM NO REPORT  . Epidural Steroid injection    Follow-up: Return for Dr. Louanne Skye as scheduled.   Procedures: No procedures performed  Lumbosacral Transforaminal Epidural Steroid Injection - Sub-Pedicular Approach with Fluoroscopic Guidance  Patient: [name]      Date of Birth: 1927-07-02 MRN: [id number] PCP: Gayland Curry, DO      Visit Date: 01/27/2018   Universal Protocol:    Date/Time: 01/27/2018  Consent Given By: the patient  Position: PRONE  Additional Comments: Vital signs were monitored before and after the procedure. Patient was prepped and draped in the usual sterile fashion. The correct patient, procedure, and site was verified.   Injection Procedure Details:  Procedure Site One Meds Administered:  Meds ordered this encounter  Medications  . dexamethasone  (DECADRON) injection 15 mg    Laterality: Right  Location/Site:  L2-L3 L3-L4  Needle size: 65 G  Needle type: Spinal  Needle Placement: Transforaminal  Findings:    -Comments: Excellent flow of contrast along the nerve and into the epidural space.  Procedure Details: After squaring off the end-plates to get a true AP view, the C-arm was positioned so that an oblique view of the foramen as noted above was visualized. The target area is just inferior to the "nose of the scotty dog" or sub pedicular. The soft tissues overlying this structure were infiltrated with 2-3 ml. of 1% Lidocaine without Epinephrine.  The spinal needle was inserted toward the target using a "trajectory" view along the fluoroscope beam.  Under AP and lateral visualization, the needle was advanced so it did not puncture dura and was located close the 6 O'Clock position of the pedical in AP tracterory. Biplanar projections were used to confirm position. Aspiration was confirmed to be negative for CSF and/or blood. A 1-2 ml. volume of Isovue-250 was injected and flow of contrast was noted at each level. Radiographs were obtained for documentation purposes.   After attaining the desired flow of contrast documented above, a 0.5 to 1.0 ml test dose of 0.25% Marcaine was injected into each respective transforaminal space.  The patient was observed for 90 seconds post injection.  After no sensory deficits were reported, and normal lower extremity motor function was noted,   the above injectate was administered so that  equal amounts of the injectate were placed at each foramen (level) into the transforaminal epidural space.   Additional Comments:  The patient tolerated the procedure well Dressing: Band-Aid    Post-procedure details: Patient was observed during the procedure. Post-procedure instructions were reviewed.  Patient left the clinic in stable condition.    Clinical History: No specialty comments available.     She reports that she has quit smoking. She has never used smokeless tobacco.  Recent Labs    11/24/17 1017  HGBA1C 6.0*    Objective:  VS:  HT:    WT:   BMI:     BP:103/63  HR:82bpm  TEMP: ( )  RESP:95 % Physical Exam  Ortho Exam Imaging: Xr C-arm No Report  Result Date: 01/27/2018 Please see Notes or Procedures tab for imaging impression.   Past Medical/Family/Surgical/Social History: Medications & Allergies reviewed per EMR, new medications updated. Patient Active Problem List   Diagnosis Date Noted  . Malignant melanoma of left temple ([place]) 11/26/2017  . Chronic venous insufficiency 07/26/2015  . Urge incontinence of urine 07/26/2015  . Rheumatoid arthritis involving multiple sites ([place]) 07/26/2015  . Depression 07/26/2015  . Glaucoma 07/26/2015  . Postmenopausal estrogen deficiency 07/26/2015  . Primary open angle glaucoma 01/02/2014  . Hyperglycemia 11/18/2012  . Hypertriglyceridemia, essential 11/18/2012  . Essential hypertension, benign 11/18/2012  . 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 Arthritis    Osteoarthritis  . Depression   . GERD (gastroesophageal reflux disease)   . Hyperlipidemia   . Osteopenia    No family history on file. Past Surgical History:  Procedure Laterality Date  . APPENDECTOMY  1936  . CERVICAL FUSION  2004  . COLON SURGERY  06/12/2010   Colonoscopy  . FINGER SURGERY  1989  . SPINE SURGERY  2009   Back Fusion  . TONSILLECTOMY  1941   Social History   Occupational History  . Not on file  Tobacco Use  . Smoking status: Former Research scientist (life sciences)  . Smokeless tobacco: Never Used  Substance and Sexual Activity  . Alcohol use: No    Alcohol/week: 0.0 oz  . Drug use: No  . Sexual activity: Never

## 2018-02-09 ENCOUNTER — Telehealth: Payer: Self-pay | Admitting: Internal Medicine

## 2018-02-09 NOTE — Telephone Encounter (Signed)
Pt called me back and confirmed. VDM (DD)

## 2018-02-09 NOTE — Telephone Encounter (Signed)
I left a message asking the patient to confirm this AWV appt w/ nurse. Last AWV VDM (DD)

## 2018-02-11 ENCOUNTER — Encounter (INDEPENDENT_AMBULATORY_CARE_PROVIDER_SITE_OTHER): Payer: Self-pay | Admitting: Specialist

## 2018-02-11 ENCOUNTER — Ambulatory Visit (INDEPENDENT_AMBULATORY_CARE_PROVIDER_SITE_OTHER): Payer: Medicare Other | Admitting: Specialist

## 2018-02-11 VITALS — BP 91/58 | HR 77 | Ht 60.0 in | Wt 136.0 lb

## 2018-02-11 DIAGNOSIS — M5136 Other intervertebral disc degeneration, lumbar region: Secondary | ICD-10-CM | POA: Diagnosis not present

## 2018-02-11 DIAGNOSIS — Z981 Arthrodesis status: Secondary | ICD-10-CM | POA: Diagnosis not present

## 2018-02-11 DIAGNOSIS — M4156 Other secondary scoliosis, lumbar region: Secondary | ICD-10-CM

## 2018-02-11 MED ORDER — DICLOFENAC SODIUM 50 MG PO TBEC: 50.0000 mg | DELAYED_RELEASE_TABLET | Freq: Three times a day (TID) | ORAL | 1 refills | Status: DC

## 2018-02-11 NOTE — Progress Notes (Signed)
Office Visit Note   Patient: [name]           Date of Birth: [birth year]           MRN: [id number] Visit Date: 02/11/2018              Requested by: Gayland Curry, DO [place], [place] [address] PCP: Gayland Curry, DO   Assessment & Plan: Visit Diagnoses:  1. Other secondary scoliosis, lumbar region   2. Degenerative disc disease, lumbar   3. History of lumbar spinal fusion     Plan: Avoid bending, stooping and avoid lifting weights greater than 10 lbs. Avoid prolong standing and walking. Avoid frequent bending and stooping  No lifting greater than 10 lbs. May use ice or moist heat for pain. Weight loss is of benefit. Handicap license is approved. Pool walking or stationary bicycle are recommended for exercises. Flexion exercises.  Follow-Up Instructions: Return in about 2 months (around 04/13/2018).   Orders:  No orders of the defined types were placed in this encounter.  No orders of the defined types were placed in this encounter.     Procedures: No procedures performed   Clinical Data: No additional findings.   Subjective: Chief Complaint  Patient presents with  . Lower Back - Follow-up    She had a Right L2-3, L3-4 TF injection with Dr. Ernestina Patches, she states that she did not get any relief from the injection at all.    84 year old female with history of lumbar spondylolisthesis and is status post lumbar decompression and fusion L4-S1 6 years ago. She has pain in the back with associated stiffness. She has pain with first arrising in the morning. Has constipation   Review of Systems  Constitutional: Negative.   HENT: Negative.   Eyes: Negative.   Respiratory: Negative.   Cardiovascular: Negative.   Gastrointestinal: Negative.   Endocrine: Negative.   Genitourinary: Negative.   Musculoskeletal: Negative.   Skin: Negative.   Allergic/Immunologic: Negative.   Neurological: Negative.   Hematological: Negative.     Psychiatric/Behavioral: Negative.      Objective: Vital Signs: BP (!) 91/58 (BP Location: Left Arm, Patient Position: Sitting)   Pulse 77   Ht 5' (1.524 m)   Wt 136 lb (61.7 kg)   BMI 26.56 kg/m   Physical Exam  Constitutional: She is oriented to person, place, and time. She appears well-developed and well-nourished.  HENT:  Head: Normocephalic and atraumatic.  Eyes: Pupils are equal, round, and reactive to light. EOM are normal.  Neck: Normal range of motion. Neck supple.  Pulmonary/Chest: Effort normal and breath sounds normal.  Abdominal: Soft. Bowel sounds are normal.  Neurological: She is alert and oriented to person, place, and time.  Skin: Skin is warm and dry.  Psychiatric: She has a normal mood and affect. Her behavior is normal. Judgment and thought content normal.    Back Exam   Tenderness  The patient is experiencing tenderness in the lumbar.  Range of Motion  Extension: abnormal  Flexion: normal  Lateral bend right: abnormal  Lateral bend left: abnormal  Rotation right: abnormal  Rotation left: abnormal   Muscle Strength  Right Quadriceps:  4/5  Left Quadriceps:  5/5  Right Hamstrings:  5/5  Left Hamstrings:  5/5   Tests  Straight leg raise right: negative Straight leg raise left: negative  Reflexes  Patellar: normal Achilles: normal Babinski's sign: normal   Other  Toe walk: normal  Heel walk: normal Sensation: normal Gait: normal  Erythema: no back redness Scars: absent  Comments:  Radiographs with mild lumbar scoliosis apex to the left Solid fusion L4-S1 retrolisthesis with DDD L1-2 and L2-3, L3-4 disc well maintained.      Specialty Comments:  No specialty comments available.  Imaging: No results found.   PMFS History: Patient Active Problem List   Diagnosis Date Noted  . Malignant melanoma of left temple ([place]) 11/26/2017  . Chronic venous insufficiency 07/26/2015  . Urge incontinence of urine 07/26/2015  . Rheumatoid  arthritis involving multiple sites ([place]) 07/26/2015  . Depression 07/26/2015  . Glaucoma 07/26/2015  . Postmenopausal estrogen deficiency 07/26/2015  . Primary open angle glaucoma 01/02/2014  . Hyperglycemia 11/18/2012  . Hypertriglyceridemia, essential 11/18/2012  . Essential hypertension, benign 11/18/2012  . 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 Arthritis    Osteoarthritis  . Depression   . GERD (gastroesophageal reflux disease)   . Hyperlipidemia   . Osteopenia     History reviewed. No pertinent family history.  Past Surgical History:  Procedure Laterality Date  . APPENDECTOMY  1936  . CERVICAL FUSION  2004  . COLON SURGERY  06/12/2010   Colonoscopy  . FINGER SURGERY  1989  . SPINE SURGERY  2009   Back Fusion  . TONSILLECTOMY  1941   Social History   Occupational History  . Not on file  Tobacco Use  . Smoking status: Former Research scientist (life sciences)  . Smokeless tobacco: Never Used  Substance and Sexual Activity  . Alcohol use: No    Alcohol/week: 0.0 oz  . Drug use: No  . Sexual activity: Never

## 2018-02-11 NOTE — Patient Instructions (Signed)
Avoid bending, stooping and avoid lifting weights greater than 10 lbs. Avoid prolong standing and walking. Avoid frequent bending and stooping  No lifting greater than 10 lbs. May use ice or moist heat for pain. Weight loss is of benefit. Handicap license is approved. Pool walking or stationary bicycle are recommended for exercises. Flexion exercises.

## 2018-02-16 DIAGNOSIS — H401132 Primary open-angle glaucoma, bilateral, moderate stage: Secondary | ICD-10-CM | POA: Diagnosis not present

## 2018-02-16 DIAGNOSIS — H43813 Vitreous degeneration, bilateral: Secondary | ICD-10-CM | POA: Diagnosis not present

## 2018-02-16 DIAGNOSIS — Z961 Presence of intraocular lens: Secondary | ICD-10-CM | POA: Diagnosis not present

## 2018-02-18 ENCOUNTER — Other Ambulatory Visit: Payer: Self-pay | Admitting: Internal Medicine

## 2018-03-01 DIAGNOSIS — Z85828 Personal history of other malignant neoplasm of skin: Secondary | ICD-10-CM | POA: Diagnosis not present

## 2018-03-01 DIAGNOSIS — C44319 Basal cell carcinoma of skin of other parts of face: Secondary | ICD-10-CM | POA: Diagnosis not present

## 2018-03-15 DIAGNOSIS — M1990 Unspecified osteoarthritis, unspecified site: Secondary | ICD-10-CM | POA: Diagnosis not present

## 2018-03-15 DIAGNOSIS — M150 Primary generalized (osteo)arthritis: Secondary | ICD-10-CM | POA: Diagnosis not present

## 2018-03-15 DIAGNOSIS — M5416 Radiculopathy, lumbar region: Secondary | ICD-10-CM | POA: Diagnosis not present

## 2018-03-15 DIAGNOSIS — E663 Overweight: Secondary | ICD-10-CM | POA: Diagnosis not present

## 2018-03-22 DIAGNOSIS — H401132 Primary open-angle glaucoma, bilateral, moderate stage: Secondary | ICD-10-CM | POA: Diagnosis not present

## 2018-03-22 DIAGNOSIS — Z961 Presence of intraocular lens: Secondary | ICD-10-CM | POA: Diagnosis not present

## 2018-03-22 DIAGNOSIS — H43813 Vitreous degeneration, bilateral: Secondary | ICD-10-CM | POA: Diagnosis not present

## 2018-03-29 ENCOUNTER — Other Ambulatory Visit: Payer: Medicare Other

## 2018-03-29 ENCOUNTER — Ambulatory Visit (INDEPENDENT_AMBULATORY_CARE_PROVIDER_SITE_OTHER): Payer: Medicare Other

## 2018-03-29 VITALS — BP 120/62 | HR 73 | Temp 98.2°F | Ht 60.0 in | Wt 135.0 lb

## 2018-03-29 DIAGNOSIS — Z0000 Encounter for general adult medical examination without abnormal findings: Secondary | ICD-10-CM | POA: Diagnosis not present

## 2018-03-29 DIAGNOSIS — R739 Hyperglycemia, unspecified: Secondary | ICD-10-CM | POA: Diagnosis not present

## 2018-03-29 DIAGNOSIS — E781 Pure hyperglyceridemia: Secondary | ICD-10-CM

## 2018-03-29 NOTE — Progress Notes (Signed)
Subjective:   [name] is a 84 y.o. female who presents for Medicare Annual (Subsequent) preventive examination.  Last AWV-01/24/2016       Objective:     Vitals: BP 120/62 (BP Location: Left Arm, Patient Position: Sitting)   Pulse 73   Temp 98.2 F (36.8 C) (Oral)   Ht 5' (1.524 m)   Wt 135 lb (61.2 kg)   SpO2 97%   BMI 26.37 kg/m   Body mass index is 26.37 kg/m.  Advanced Directives 03/29/2018 01/29/2017 01/22/2017 01/13/2017 12/05/2016 07/25/2016 07/21/2016  Does Patient Have a Medical Advance Directive? Yes Yes Yes Yes No Yes Yes  Type of Paramedic of [place];Living will Healthcare Power of [place]  Does patient want to make changes to medical advance directive? No - Patient declined - - - - - -  Copy of [place] in Chart? Yes - Yes Yes - Yes Yes  Would patient like information on creating a medical advance directive? - - - - No - Patient declined - -    Tobacco Social History   Tobacco Use  Smoking Status Former Smoker  Smokeless Tobacco Never Used     Counseling given: Not Answered   Clinical Intake:  Pre-visit preparation completed: No  Pain : 0-10 Pain Score: 8  Pain Type: Chronic pain Pain Location: Back Pain Orientation: Lower Pain Descriptors / Indicators: Aching     Nutritional Risks: None Diabetes: No  How often do you need to have someone help you when you read instructions, pamphlets, or other written materials from your doctor or pharmacy?: 1 - Never What is the last grade level you completed in school?: 12th grade  Interpreter Needed?: No  Information entered by :: [name], RN  Past Medical History:  Diagnosis Date  . Arthritis    Osteoarthritis  . Depression   . GERD (gastroesophageal reflux disease)   . Hyperlipidemia   . Osteopenia    Past Surgical History:    Procedure Laterality Date  . APPENDECTOMY  1936  . CERVICAL FUSION  2004  . COLON SURGERY  06/12/2010   Colonoscopy  . FINGER SURGERY  1989  . MELANOMA EXCISION  11/16/2017   forehead  . SPINE SURGERY  2009   Back Fusion  . TONSILLECTOMY  1941   History reviewed. No pertinent family history. Social History   Socioeconomic History  . Marital status: Divorced    Spouse name: Not on file  . Number of children: Not on file  . Years of education: Not on file  . Highest education level: Not on file  Occupational History  . Not on file  Social Needs  . Financial resource strain: Not hard at all  . Food insecurity:    Worry: Never true    Inability: Never true  . Transportation needs:    Medical: No    Non-medical: No  Tobacco Use  . Smoking status: Former Research scientist (life sciences)  . Smokeless tobacco: Never Used  Substance and Sexual Activity  . Alcohol use: No    Alcohol/week: 0.0 standard drinks    Comment: rare  . Drug use: No  . Sexual activity: Never  Lifestyle  . Physical activity:    Days per week: 0 days    Minutes per session: 0 min  . Stress: Only a little  Relationships  . Social connections:  Talks on phone: More than three times a week    Gets together: More than three times a week    Attends religious service: Never    Active member of club or organization: No    Attends meetings of clubs or organizations: Never    Relationship status: Widowed  Other Topics Concern  . Not on file  Social History Narrative  . Not on file    Outpatient Encounter Medications as of 03/29/2018  Medication Sig  . alendronate (FOSAMAX) 70 MG tablet Take one tablet by mouth weekly.  Stay upright for 1 hour after taking.  . brimonidine (ALPHAGAN) 0.15 % ophthalmic solution INT 1 GTT INTO OU BID  . cholecalciferol (VITAMIN D) 1000 UNITS tablet Take 1,000 Units by mouth 2 (two) times daily.   . diclofenac (VOLTAREN) 50 MG EC tablet Take 1 tablet (50 mg total) by mouth 3 (three) times  daily.  . dorzolamide-timolol (COSOPT) 22.3-6.8 MG/ML ophthalmic solution Place 1 drop into both eyes daily.  Marland Kitchen FLUoxetine (PROZAC) 20 MG tablet Take one tablet by mouth once daily  . gabapentin (NEURONTIN) 300 MG capsule Take 600 mg by mouth at bedtime.   Marland Kitchen latanoprost (XALATAN) 0.005 % ophthalmic solution Place 1 drop into both eyes at bedtime.   Marland Kitchen lisinopril-hydrochlorothiazide (PRINZIDE,ZESTORETIC) 20-12.5 MG tablet TAKE 1 TABLET BY MOUTH DAILY  . meclizine (ANTIVERT) 12.5 MG tablet Take 1 tablet (12.5 mg total) by mouth 3 (three) times daily as needed for dizziness.  Marland Kitchen oxyCODONE-acetaminophen (PERCOCET/ROXICET) 5-325 MG per tablet Take 1 tablet by mouth 3 (three) times daily as needed for pain.   . pantoprazole (PROTONIX) 40 MG tablet Take one tablet by mouth once daily for acid reflux  . predniSONE (DELTASONE) 1 MG tablet 3 tablets Once a day Orally 90 days  . simvastatin (ZOCOR) 20 MG tablet Take one tablet by mouth once daily for choelsterol   No facility-administered encounter medications on file as of 03/29/2018.     Activities of Daily Living In your present state of health, do you have any difficulty performing the following activities: 03/29/2018  Hearing? N  Vision? N  Walking or climbing stairs? N  Dressing or bathing? N  Doing errands, shopping? N  Preparing Food and eating ? N  Using the Toilet? N  In the past six months, have you accidently leaked urine? Y  Do you have problems with loss of bowel control? N  Managing your Medications? N  Managing your Finances? N  Housekeeping or managing your Housekeeping? N  Some recent data might be hidden    Patient Care Team: Gayland Curry, DO as PCP - General (Geriatric Medicine) Warden Fillers, MD as Consulting Physician (Ophthalmology) Griselda Miner, MD as Consulting Physician (Dermatology)    Assessment:   This is a routine wellness examination for [facility].  Exercise Activities and Dietary  recommendations Current Exercise Habits: The patient does not participate in regular exercise at present, Exercise limited by: None identified  Goals   None     Fall Risk Fall Risk  03/29/2018 11/26/2017 11/26/2017 05/18/2017 01/13/2017  Falls in the past year? Yes Yes Yes Yes Yes  Number falls in past yr: 2 or more 1 1 2  or more 2 or more  Injury with Fall? No Yes No No No   Is the patient's home free of loose throw rugs in walkways, pet beds, electrical cords, etc?   yes      Grab bars in the bathroom? yes  Handrails on the stairs?   yes      Adequate lighting?   yes  Timed Get Up and Go performed: 22 seconds  Depression Screen PHQ 2/9 Scores 03/29/2018 11/26/2017 05/18/2017 12/05/2016  PHQ - 2 Score 0 0 0 0     Cognitive Function MMSE - Mini Mental State Exam 03/29/2018 01/24/2016  Orientation to time 3 5  Orientation to Place 5 5  Registration 3 3  Attention/ Calculation 5 5  Recall 3 3  Language- name 2 objects 2 2  Language- repeat 1 1  Language- follow 3 step command 3 3  Language- read & follow direction 1 1  Write a sentence 1 1  Copy design 0 1  Total score 27 30        Immunization History  Administered Date(s) Administered  . DTaP 08/19/2007  . Influenza, High Dose Seasonal PF 05/18/2017  . Influenza,inj,Quad PF,6+ Mos 05/31/2013, 05/23/2014, 05/04/2015, 05/04/2015, 05/01/2016  . Influenza-Unspecified 05/18/2010, 06/10/2011, 06/09/2012  . Pneumococcal Conjugate-13 01/22/2015  . Pneumococcal Polysaccharide-23 08/19/2007  . Tdap 03/03/2014  . Zoster 03/18/2013    Qualifies for Shingles Vaccine? Yes, educated and declined for now  Screening Tests Health Maintenance  Topic Date Due  . FOOT EXAM  02/12/1937  . OPHTHALMOLOGY EXAM  02/12/1937  . INFLUENZA VACCINE  03/18/2018  . HEMOGLOBIN A1C  05/26/2018  . TETANUS/TDAP  03/03/2024  . DEXA SCAN  Completed  . PNA vac Low Risk Adult  Completed    Cancer Screenings: Lung: Low Dose CT Chest recommended  if Age 53-80 years, 30 pack-year currently smoking OR have quit w/in 15years. Patient does not qualify. Breast:  Up to date on Mammogram? Yes   Up to date of Bone Density/Dexa? Yes Colorectal: up to date  Additional Screenings:  Hepatitis C Screening: declined     Plan:    I have personally reviewed and addressed the Medicare Annual Wellness questionnaire and have noted the following in the patient's chart:  A. Medical and social history B. Use of alcohol, tobacco or illicit drugs  C. Current medications and supplements D. Functional ability and status E.  Nutritional status F.  Physical activity G. Advance directives H. List of other physicians I.  Hospitalizations, surgeries, and ER visits in previous 12 months J.  [place] to include hearing, vision, cognitive, depression L. Referrals and appointments - none  In addition, I have reviewed and discussed with patient certain preventive protocols, quality metrics, and best practice recommendations. A written personalized care plan for preventive services as well as general preventive health recommendations were provided to patient.  See attached scanned questionnaire for additional information.   Signed,   [name], RN Nurse Health Advisor  Patient Concerns: None

## 2018-03-29 NOTE — Patient Instructions (Signed)
[name] , Thank you for taking time to come for your Medicare Wellness Visit. I appreciate your ongoing commitment to your health goals. Please review the following plan we discussed and let me know if I can assist you in the future.   Screening recommendations/referrals: Colonoscopy excluded, over age 82 Mammogram excluded, over age 46 Bone Density up to date Recommended yearly ophthalmology/optometry visit for glaucoma screening and checkup Recommended yearly dental visit for hygiene and checkup  Vaccinations: Influenza vaccine up to date, due 2019 fall season Pneumococcal vaccine up to date, completed Tdap vaccine up to date, due 03/03/2024 Shingles vaccine due    Advanced directives: in chart  Conditions/risks identified: none  Next appointment: Dr. Mariea Clonts 04/01/2018 @ 11am                       [name], RN 04/01/2019 @ 10am   Preventive Care 65 Years and Older, Female Preventive care refers to lifestyle choices and visits with your health care provider that can promote health and wellness. What does preventive care include?  A yearly physical exam. This is also called an annual well check.  Dental exams once or twice a year.  Routine eye exams. Ask your health care provider how often you should have your eyes checked.  Personal lifestyle choices, including:  Daily care of your teeth and gums.  Regular physical activity.  Eating a healthy diet.  Avoiding tobacco and drug use.  Limiting alcohol use.  Practicing safe sex.  Taking low-dose aspirin every day.  Taking vitamin and mineral supplements as recommended by your health care provider. What happens during an annual well check? The services and screenings done by your health care provider during your annual well check will depend on your age, overall health, lifestyle risk factors, and family history of disease. Counseling  Your health care provider may ask you questions about your:  Alcohol  use.  Tobacco use.  Drug use.  Emotional well-being.  Home and relationship well-being.  Sexual activity.  Eating habits.  History of falls.  Memory and ability to understand (cognition).  Work and work Statistician.  Reproductive health. Screening  You may have the following tests or measurements:  Height, weight, and BMI.  Blood pressure.  Lipid and cholesterol levels. These may be checked every 5 years, or more frequently if you are over 66 years old.  Skin check.  Lung cancer screening. You may have this screening every year starting at age 61 if you have a 30-pack-year history of smoking and currently smoke or have quit within the past 15 years.  Fecal occult blood test (FOBT) of the stool. You may have this test every year starting at age 52.  Flexible sigmoidoscopy or colonoscopy. You may have a sigmoidoscopy every 5 years or a colonoscopy every 10 years starting at age 49.  Hepatitis C blood test.  Hepatitis B blood test.  Sexually transmitted disease (STD) testing.  Diabetes screening. This is done by checking your blood sugar (glucose) after you have not eaten for a while (fasting). You may have this done every 1-3 years.  Bone density scan. This is done to screen for osteoporosis. You may have this done starting at age 71.  Mammogram. This may be done every 1-2 years. Talk to your health care provider about how often you should have regular mammograms. Talk with your health care provider about your test results, treatment options, and if necessary, the need for more tests. Vaccines  Your health care provider may recommend certain vaccines, such as:  Influenza vaccine. This is recommended every year.  Tetanus, diphtheria, and acellular pertussis (Tdap, Td) vaccine. You may need a Td booster every 10 years.  Zoster vaccine. You may need this after age 60.  Pneumococcal 13-valent conjugate (PCV13) vaccine. One dose is recommended after age  41.  Pneumococcal polysaccharide (PPSV23) vaccine. One dose is recommended after age 33. Talk to your health care provider about which screenings and vaccines you need and how often you need them. This information is not intended to replace advice given to you by your health care provider. Make sure you discuss any questions you have with your health care provider. Document Released: 08/31/2015 Document Revised: 04/23/2016 Document Reviewed: 06/05/2015 Elsevier Interactive Patient Education  2017 [employer] Prevention in the Home Falls can cause injuries. They can happen to people of all ages. There are many things you can do to make your home safe and to help prevent falls. What can I do on the outside of my home?  Regularly fix the edges of walkways and driveways and fix any cracks.  Remove anything that might make you trip as you walk through a door, such as a raised step or threshold.  Trim any bushes or trees on the path to your home.  Use bright outdoor lighting.  Clear any walking paths of anything that might make someone trip, such as rocks or tools.  Regularly check to see if handrails are loose or broken. Make sure that both sides of any steps have handrails.  Any raised decks and porches should have guardrails on the edges.  Have any leaves, snow, or ice cleared regularly.  Use sand or salt on walking paths during winter.  Clean up any spills in your garage right away. This includes oil or grease spills. What can I do in the bathroom?  Use night lights.  Install grab bars by the toilet and in the tub and shower. Do not use towel bars as grab bars.  Use non-skid mats or decals in the tub or shower.  If you need to sit down in the shower, use a plastic, non-slip stool.  Keep the floor dry. Clean up any water that spills on the floor as soon as it happens.  Remove soap buildup in the tub or shower regularly.  Attach bath mats securely with double-sided  non-slip rug tape.  Do not have throw rugs and other things on the floor that can make you trip. What can I do in the bedroom?  Use night lights.  Make sure that you have a light by your bed that is easy to reach.  Do not use any sheets or blankets that are too big for your bed. They should not hang down onto the floor.  Have a firm chair that has side arms. You can use this for support while you get dressed.  Do not have throw rugs and other things on the floor that can make you trip. What can I do in the kitchen?  Clean up any spills right away.  Avoid walking on wet floors.  Keep items that you use a lot in easy-to-reach places.  If you need to reach something above you, use a strong step stool that has a grab bar.  Keep electrical cords out of the way.  Do not use floor polish or wax that makes floors slippery. If you must use wax, use non-skid floor wax.  Do  not have throw rugs and other things on the floor that can make you trip. What can I do with my stairs?  Do not leave any items on the stairs.  Make sure that there are handrails on both sides of the stairs and use them. Fix handrails that are broken or loose. Make sure that handrails are as long as the stairways.  Check any carpeting to make sure that it is firmly attached to the stairs. Fix any carpet that is loose or worn.  Avoid having throw rugs at the top or bottom of the stairs. If you do have throw rugs, attach them to the floor with carpet tape.  Make sure that you have a light switch at the top of the stairs and the bottom of the stairs. If you do not have them, ask someone to add them for you. What else can I do to help prevent falls?  Wear shoes that:  Do not have high heels.  Have rubber bottoms.  Are comfortable and fit you well.  Are closed at the toe. Do not wear sandals.  If you use a stepladder:  Make sure that it is fully opened. Do not climb a closed stepladder.  Make sure that both  sides of the stepladder are locked into place.  Ask someone to hold it for you, if possible.  Clearly mark and make sure that you can see:  Any grab bars or handrails.  First and last steps.  Where the edge of each step is.  Use tools that help you move around (mobility aids) if they are needed. These include:  Canes.  Walkers.  Scooters.  Crutches.  Turn on the lights when you go into a dark area. Replace any light bulbs as soon as they burn out.  Set up your furniture so you have a clear path. Avoid moving your furniture around.  If any of your floors are uneven, fix them.  If there are any pets around you, be aware of where they are.  Review your medicines with your doctor. Some medicines can make you feel dizzy. This can increase your chance of falling. Ask your doctor what other things that you can do to help prevent falls. This information is not intended to replace advice given to you by your health care provider. Make sure you discuss any questions you have with your health care provider. Document Released: 05/31/2009 Document Revised: 01/10/2016 Document Reviewed: 09/08/2014 Elsevier Interactive Patient Education  2017 [employer].

## 2018-03-30 LAB — CBC WITH DIFFERENTIAL/PLATELET
Basophils Absolute: 59 cells/uL (ref 0–200)
Basophils Relative: 0.9 %
Eosinophils Absolute: 205 cells/uL (ref 15–500)
Eosinophils Relative: 3.1 %
HCT: 38.3 % (ref 35.0–45.0)
Hemoglobin: 12.4 g/dL (ref 11.7–15.5)
Lymphs Abs: 1914 cells/uL (ref 850–3900)
MCH: 28.2 pg (ref 27.0–33.0)
MCHC: 32.4 g/dL (ref 32.0–36.0)
MCV: 87 fL (ref 80.0–100.0)
MPV: 10.7 fL (ref 7.5–12.5)
Monocytes Relative: 8.9 %
Neutro Abs: 3835 cells/uL (ref 1500–7800)
Neutrophils Relative %: 58.1 %
Platelets: 205 10*3/uL (ref 140–400)
RBC: 4.4 10*6/uL (ref 3.80–5.10)
RDW: 13.5 % (ref 11.0–15.0)
Total Lymphocyte: 29 %
WBC mixed population: 587 cells/uL (ref 200–950)
WBC: 6.6 10*3/uL (ref 3.8–10.8)

## 2018-03-30 LAB — LIPID PANEL
Cholesterol: 224 mg/dL — ABNORMAL HIGH (ref <200)
HDL: 67 mg/dL (ref >50)
LDL Cholesterol (Calc): 123 mg/dL (calc) — ABNORMAL HIGH
Non-HDL Cholesterol (Calc): 157 mg/dL (calc) — ABNORMAL HIGH (ref <130)
Total CHOL/HDL Ratio: 3.3 (calc) (ref <5.0)
Triglycerides: 226 mg/dL — ABNORMAL HIGH (ref <150)

## 2018-03-30 LAB — HEMOGLOBIN A1C
Hgb A1c MFr Bld: 5.9 % of total Hgb — ABNORMAL HIGH (ref <5.7)
Mean Plasma Glucose: 123 (calc)
eAG (mmol/L): 6.8 (calc)

## 2018-03-30 LAB — COMPLETE METABOLIC PANEL WITH GFR
AG Ratio: 1.8 (calc) (ref 1.0–2.5)
ALT: 13 U/L (ref 6–29)
AST: 15 U/L (ref 10–35)
Albumin: 3.8 g/dL (ref 3.6–5.1)
Alkaline phosphatase (APISO): 55 U/L (ref 33–130)
BUN: 15 mg/dL (ref 7–25)
CO2: 30 mmol/L (ref 20–32)
Calcium: 9.3 mg/dL (ref 8.6–10.4)
Chloride: 105 mmol/L (ref 98–110)
Creat: 0.78 mg/dL (ref 0.60–0.88)
GFR, Est African American: 78 mL/min/{1.73_m2} (ref >=60)
GFR, Est Non African American: 67 mL/min/{1.73_m2} (ref >=60)
Globulin: 2.1 g/dL (calc) (ref 1.9–3.7)
Glucose, Bld: 90 mg/dL (ref 65–99)
Potassium: 4.3 mmol/L (ref 3.5–5.3)
Sodium: 142 mmol/L (ref 135–146)
Total Bilirubin: 0.4 mg/dL (ref 0.2–1.2)
Total Protein: 5.9 g/dL — ABNORMAL LOW (ref 6.1–8.1)

## 2018-04-01 ENCOUNTER — Ambulatory Visit (INDEPENDENT_AMBULATORY_CARE_PROVIDER_SITE_OTHER): Payer: Medicare Other | Admitting: Internal Medicine

## 2018-04-01 ENCOUNTER — Encounter: Payer: Self-pay | Admitting: Internal Medicine

## 2018-04-01 VITALS — BP 120/70 | HR 63 | Temp 98.4°F | Wt 134.0 lb

## 2018-04-01 DIAGNOSIS — E781 Pure hyperglyceridemia: Secondary | ICD-10-CM

## 2018-04-01 DIAGNOSIS — N3281 Overactive bladder: Secondary | ICD-10-CM

## 2018-04-01 DIAGNOSIS — M5441 Lumbago with sciatica, right side: Secondary | ICD-10-CM | POA: Diagnosis not present

## 2018-04-01 DIAGNOSIS — Z23 Encounter for immunization: Secondary | ICD-10-CM | POA: Diagnosis not present

## 2018-04-01 DIAGNOSIS — R739 Hyperglycemia, unspecified: Secondary | ICD-10-CM

## 2018-04-01 DIAGNOSIS — M159 Polyosteoarthritis, unspecified: Secondary | ICD-10-CM

## 2018-04-01 DIAGNOSIS — G8929 Other chronic pain: Secondary | ICD-10-CM

## 2018-04-01 DIAGNOSIS — H401110 Primary open-angle glaucoma, right eye, stage unspecified: Secondary | ICD-10-CM

## 2018-04-01 MED ORDER — DICLOFENAC SODIUM 50 MG PO TBEC: 50.0000 mg | DELAYED_RELEASE_TABLET | Freq: Three times a day (TID) | ORAL | 1 refills | Status: DC

## 2018-04-01 NOTE — Progress Notes (Signed)
Location:  [facility] Provider:  Chanoch Mccleery L. Mariea Clonts, D.O., C.M.D.  Code Status: DNR Goals of Care:  Advanced Directives 03/29/2018  Does Patient Have a Medical Advance Directive? Yes  Type of Advance Directive [place]  Does patient want to make changes to medical advance directive? No - Patient declined  Copy of [place] in Chart? Yes  Would patient like information on creating a medical advance directive? -   Chief Complaint  Patient presents with  . Medical Management of Chronic Issues    66mth follow-up    HPI: Patient is a 84 y.o. female seen today for medical management of chronic diseases.    Got 27/30 on mmse at [facility].  She is tired of peeing all night.  Has to get up and change her diaper 2-3 times.  Still wakes up to urinate anyway.  No UTI in May.  Says nothing really wrong but has this trouble.  myrbetriq did not help her.  Does not want to go to a urologist.  Anticholinergics are not good for her with her worsening glaucoma and balance problems.    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  Glaucoma getting worse.  If pressure not coming down, will need laser surgery.  Got two samples of eye drops.    Still tired. Wishes she could take pep up pills.    Rheum told her to see a back specialist and Dr. Louanne Skye recommended:  Avoid bending, stooping and avoid lifting weights greater than 10 lbs. Avoid prolong standing and walking. Avoid frequent bending and stooping  No lifting greater than 10 lbs. May use ice or moist heat for pain. Weight loss is of benefit. Handicap license is approved. Pool walking or stationary bicycle are recommended for exercises. Flexion exercises. He prescribed diclofenac for her back.  She feels like it doesn't help that much.  She is taking it with meals.  Remains on her prednisone.  Does not use 3 percocet a day--uses about 1/2 twice a day due to constipation from them.    No more  vertigo recently. Therapy was effective.  hba1c improved a little.    Reviewed triglycerides and bad cholesterol.  Is eating ice cream.  Says she has to die from something and might as well die happy.    Past Medical History:  Diagnosis Date  . Arthritis    Osteoarthritis  . Depression   . GERD (gastroesophageal reflux disease)   . Hyperlipidemia   . Osteopenia     Past Surgical History:  Procedure Laterality Date  . APPENDECTOMY  1936  . CERVICAL FUSION  2004  . COLON SURGERY  06/12/2010   Colonoscopy  . FINGER SURGERY  1989  . MELANOMA EXCISION  11/16/2017   forehead  . SPINE SURGERY  2009   Back Fusion  . TONSILLECTOMY  1941    Allergies  Allergen Reactions  . Penicillins Rash    Outpatient Encounter Medications as of 04/01/2018  Medication Sig  . alendronate (FOSAMAX) 70 MG tablet Take one tablet by mouth weekly.  Stay upright for 1 hour after taking.  . brimonidine (ALPHAGAN) 0.15 % ophthalmic solution INT 1 GTT INTO OU BID  . cholecalciferol (VITAMIN D) 1000 UNITS tablet Take 1,000 Units by mouth 2 (two) times daily.   . diclofenac (VOLTAREN) 50 MG EC tablet Take 1 tablet (50 mg total) by mouth 3 (three) times daily.  . dorzolamide-timolol (COSOPT) 22.3-6.8 MG/ML ophthalmic solution  Place 1 drop into both eyes daily.  Marland Kitchen FLUoxetine (PROZAC) 20 MG tablet Take one tablet by mouth once daily  . gabapentin (NEURONTIN) 300 MG capsule Take 600 mg by mouth at bedtime.   Marland Kitchen latanoprost (XALATAN) 0.005 % ophthalmic solution Place 1 drop into both eyes at bedtime.   Marland Kitchen lisinopril-hydrochlorothiazide (PRINZIDE,ZESTORETIC) 20-12.5 MG tablet TAKE 1 TABLET BY MOUTH DAILY  . meclizine (ANTIVERT) 12.5 MG tablet Take 1 tablet (12.5 mg total) by mouth 3 (three) times daily as needed for dizziness.  Marland Kitchen oxyCODONE-acetaminophen (PERCOCET/ROXICET) 5-325 MG per tablet Take 1 tablet by mouth 3 (three) times daily as needed for pain.   . pantoprazole (PROTONIX) 40 MG tablet Take one tablet by  mouth once daily for acid reflux  . predniSONE (DELTASONE) 1 MG tablet 3 tablets Once a day Orally 90 days  . simvastatin (ZOCOR) 20 MG tablet Take one tablet by mouth once daily for choelsterol   No facility-administered encounter medications on file as of 04/01/2018.     Review of Systems:  Review of Systems  Constitutional: Positive for malaise/fatigue. Negative for chills and fever.  HENT: Positive for hearing loss.        Some mild HOH  Eyes: Negative for blurred vision.       Glaucoma  Respiratory: Negative for cough and shortness of breath.   Cardiovascular: Negative for chest pain, palpitations and leg swelling.  Gastrointestinal: Negative for abdominal pain, blood in stool, constipation and melena.  Genitourinary: Positive for frequency and urgency. Negative for dysuria, flank pain and hematuria.       Nocturia  Musculoskeletal: Positive for back pain, joint pain and myalgias. Negative for falls.  Skin: Negative for itching and rash.  Neurological: Positive for tingling and sensory change. Negative for dizziness and loss of consciousness.  Endo/Heme/Allergies: Bruises/bleeds easily.  Psychiatric/Behavioral: Positive for memory loss. Negative for depression. The patient is not nervous/anxious.        Subjective memory loss and mmse 27/30    Health Maintenance  Topic Date Due  . FOOT EXAM  02/12/1937  . OPHTHALMOLOGY EXAM  02/12/1937  . INFLUENZA VACCINE  03/18/2018  . HEMOGLOBIN A1C  09/29/2018  . TETANUS/TDAP  03/03/2024  . DEXA SCAN  Completed  . PNA vac Low Risk Adult  Completed    Physical Exam: Vitals:   04/01/18 1108  BP: 120/70  Pulse: 63  Temp: 98.4 F (36.9 C)  TempSrc: Oral  SpO2: 94%  Weight: 134 lb (60.8 kg)   Body mass index is 26.17 kg/m. Physical Exam  Constitutional: She is oriented to person, place, and time. She appears well-developed. No distress.  HENT:  Head: Normocephalic and atraumatic.  Cardiovascular: Normal rate, regular rhythm,  normal heart sounds and intact distal pulses.  Pulmonary/Chest: Effort normal and breath sounds normal. No respiratory distress.  Abdominal: Soft. Bowel sounds are normal. She exhibits no distension and no mass. There is no tenderness. There is no guarding.  Abdominal obesity  Musculoskeletal: Normal range of motion.  Unsteady gait, uses cane, right leg atrophy vs left  Neurological: She is alert and oriented to person, place, and time.  Skin: Skin is warm and dry. Capillary refill takes less than 2 seconds.  Ecchymoses at phlebotomy site of right arm  Psychiatric: She has a normal mood and affect.    Labs reviewed: Basic Metabolic Panel: Recent Labs    11/24/17 1017 03/29/18 1054  NA 141 142  K 4.3 4.3  CL 102 105  CO2 32  30  GLUCOSE 94 90  BUN 19 15  CREATININE 0.78 0.78  CALCIUM 10.0 9.3   Liver Function Tests: Recent Labs    11/24/17 1017 03/29/18 1054  AST 16 15  ALT 12 13  BILITOT 0.5 0.4  PROT 6.4 5.9*   No results for input(s): LIPASE, AMYLASE in the last 8760 hours. No results for input(s): AMMONIA in the last 8760 hours. CBC: Recent Labs    11/24/17 1017 03/29/18 1054  WBC 7.0 6.6  NEUTROABS 4,172 3,835  HGB 13.6 12.4  HCT 40.9 38.3  MCV 87.0 87.0  PLT 271 205   Lipid Panel: Recent Labs    11/24/17 1017 03/29/18 1054  CHOL 231* 224*  HDL 75 67  LDLCALC 125* 123*  TRIG 194* 226*  CHOLHDL 3.1 3.3   Lab Results  Component Value Date   HGBA1C 5.9 (H) 03/29/2018    Assessment/Plan 1. Chronic right-sided low back pain with right-sided sciatica - seeing Dr. Louanne Skye and had xrays there, but I'm unable to see the xray report - was started on diclofenac, is already on prednisone--counseled on importance of taking it with food to avoid GI bleeding - diclofenac (VOLTAREN) 50 MG EC tablet; Take 1 tablet (50 mg total) by mouth 3 (three) times daily. With food  Dispense: 60 tablet; Refill: 1 -if not noticing much improvement in pain with the  diclofenac, she may stop it due to bleeding risk in combo with prednisone, fosamax  2. Hyperglycemia -stable, enjoys her ice cream and has no plans to quit  3. Hypertriglyceridemia, essential -remains elevated, not interested in dietary changes at this time in her life, but was counseled  4. OAB (overactive bladder) -does not want urology consult at this point, not a good candidate for anticholinergics and myrbetriq was ineffective for her  5. Primary open angle glaucoma of right eye, unspecified glaucoma stage -may need laser surgery if drops are not adequate, has f/u coming up  6. Generalized OA -reviewed rheum notes which indicate she does NOT have RA which a previous provider added to her chart and I've unfortunately carried forward--has osteoarthritis NOT RA so removed it and other associations from her problem list  7. Need for influenza vaccination -flu shot given today  Labs/tests ordered:   Orders Placed This Encounter  Procedures  . Flu Vaccine QUAD 6+ mos PF IM (Fluarix Quad PF)   Next appt:  4 mos med mgt  Rober Skeels L. Cobe Viney, D.O. [facility] [address], [place] [address] Cell Phone (Mon-Fri 8am-5pm):  [phone] On Call:  [phone] & follow prompts after 5pm & weekends Office Phone:  [phone] Office Fax:  [phone]

## 2018-04-14 ENCOUNTER — Encounter (INDEPENDENT_AMBULATORY_CARE_PROVIDER_SITE_OTHER): Payer: Self-pay | Admitting: Specialist

## 2018-04-14 ENCOUNTER — Ambulatory Visit (INDEPENDENT_AMBULATORY_CARE_PROVIDER_SITE_OTHER): Payer: Medicare Other | Admitting: Specialist

## 2018-04-14 VITALS — Ht 60.0 in | Wt 136.0 lb

## 2018-04-14 DIAGNOSIS — M5441 Lumbago with sciatica, right side: Secondary | ICD-10-CM

## 2018-04-14 DIAGNOSIS — M4156 Other secondary scoliosis, lumbar region: Secondary | ICD-10-CM

## 2018-04-14 DIAGNOSIS — Z981 Arthrodesis status: Secondary | ICD-10-CM | POA: Diagnosis not present

## 2018-04-14 DIAGNOSIS — M5136 Other intervertebral disc degeneration, lumbar region: Secondary | ICD-10-CM | POA: Diagnosis not present

## 2018-04-14 DIAGNOSIS — G8929 Other chronic pain: Secondary | ICD-10-CM

## 2018-04-14 MED ORDER — DICLOFENAC SODIUM 50 MG PO TBEC: 50.0000 mg | DELAYED_RELEASE_TABLET | Freq: Three times a day (TID) | ORAL | 3 refills | Status: DC

## 2018-04-14 NOTE — Patient Instructions (Signed)
Plan: Avoid bending, stooping and avoid lifting weights greater than 10 lbs. Avoid prolong standing and walking. Avoid frequent bending and stooping  No lifting greater than 10 lbs. May use ice or moist heat for pain. Weight loss is of benefit. Handicap license is approved. Pool walking or stationary bicycle are recommended for exercises. Flexion exercises.

## 2018-04-14 NOTE — Progress Notes (Signed)
Office Visit Note   Patient: [name]           Date of Birth: [birth year]           MRN: [id number] Visit Date: 04/14/2018              Requested by: Gayland Curry, DO [place], [place] [address] PCP: Gayland Curry, DO   Assessment & Plan: Visit Diagnoses:  1. Other secondary scoliosis, lumbar region   2. Degenerative disc disease, lumbar   3. History of lumbar fusion     Plan: Avoid bending, stooping and avoid lifting weights greater than 10 lbs. Avoid prolong standing and walking. Avoid frequent bending and stooping  No lifting greater than 10 lbs. May use ice or moist heat for pain. Weight loss is of benefit. Handicap license is approved. Pool walking or stationary bicycle are recommended for exercises. Flexion exercises.  Follow-Up Instructions: Return in about 6 months (around 10/15/2018).   Orders:  No orders of the defined types were placed in this encounter.  No orders of the defined types were placed in this encounter.     Procedures: No procedures performed   Clinical Data: No additional findings.   Subjective: Chief Complaint  Patient presents with  . Lower Back - Follow-up    Back is still bothering her, buttock pain is better but it will bother her occasionally    84 year old female with history of back pain with radiation into the hips. She has been taking prednisone 1mg  for a long time, a couple of years. Started on  Diclofenac and she reports significant improvement in her pain . She has bilateral shoulder pains, bilateral hand arthrosis changes. She take one diclofenac twice a day And this helps to relieve the pain. Today she reports no complaints, wants the diclofenac renewed. No bowel or bladder difficulty.    Review of Systems  Constitutional: Negative.   HENT: Negative.   Eyes: Negative.   Respiratory: Negative.   Cardiovascular: Negative.   Gastrointestinal: Negative.   Endocrine: Negative.   Genitourinary:  Negative.   Musculoskeletal: Negative.   Skin: Negative.   Allergic/Immunologic: Negative.   Neurological: Negative.   Hematological: Negative.   Psychiatric/Behavioral: Negative.      Objective: Vital Signs: Ht 5' (1.524 m)   Wt 136 lb (61.7 kg)   BMI 26.56 kg/m   Physical Exam  Constitutional: She is oriented to person, place, and time. She appears well-developed and well-nourished.  HENT:  Head: Normocephalic and atraumatic.  Eyes: Pupils are equal, round, and reactive to light. EOM are normal.  Neck: Normal range of motion. Neck supple.  Pulmonary/Chest: Effort normal and breath sounds normal.  Abdominal: Soft. Bowel sounds are normal.  Neurological: She is alert and oriented to person, place, and time.  Skin: Skin is warm and dry.  Psychiatric: She has a normal mood and affect. Her behavior is normal. Judgment and thought content normal.    Back Exam   Tenderness  The patient is experiencing tenderness in the lumbar.  Range of Motion  Extension: abnormal  Flexion: abnormal  Lateral bend right: normal  Lateral bend left: normal  Rotation right: normal  Rotation left: normal   Muscle Strength  Right Quadriceps:  5/5  Left Quadriceps:  5/5  Right Hamstrings:  5/5  Left Hamstrings:  5/5   Tests  Straight leg raise right: negative Straight leg raise left: negative  Reflexes  Patellar: abnormal Achilles: abnormal  Babinski's sign: normal   Other  Toe walk: normal Heel walk: normal Sensation: normal Gait: normal  Erythema: no back redness Scars: absent      Specialty Comments:  No specialty comments available.  Imaging: No results found.   PMFS History: Patient Active Problem List   Diagnosis Date Noted  . Need for influenza vaccination 04/01/2018  . Malignant melanoma of left temple ([place]) 11/26/2017  . Chronic venous insufficiency 07/26/2015  . Urge incontinence of urine 07/26/2015  . Depression 07/26/2015  . Glaucoma 07/26/2015  .  Postmenopausal estrogen deficiency 07/26/2015  . Primary open angle glaucoma 01/02/2014  . Hyperglycemia 11/18/2012  . Hypertriglyceridemia, essential 11/18/2012  . Essential hypertension, benign 11/18/2012  . Peripheral neuropathy 11/18/2012   Past Medical History:  Diagnosis Date  . Arthritis    Osteoarthritis  . Depression   . GERD (gastroesophageal reflux disease)   . Hyperlipidemia   . Osteopenia     History reviewed. No pertinent family history.  Past Surgical History:  Procedure Laterality Date  . APPENDECTOMY  1936  . CERVICAL FUSION  2004  . COLON SURGERY  06/12/2010   Colonoscopy  . FINGER SURGERY  1989  . MELANOMA EXCISION  11/16/2017   forehead  . SPINE SURGERY  2009   Back Fusion  . TONSILLECTOMY  1941   Social History   Occupational History  . Not on file  Tobacco Use  . Smoking status: Former Research scientist (life sciences)  . Smokeless tobacco: Never Used  Substance and Sexual Activity  . Alcohol use: No    Alcohol/week: 0.0 standard drinks    Comment: rare  . Drug use: No  . Sexual activity: Never

## 2018-04-21 ENCOUNTER — Other Ambulatory Visit: Payer: Self-pay | Admitting: Internal Medicine

## 2018-04-30 DIAGNOSIS — C44321 Squamous cell carcinoma of skin of nose: Secondary | ICD-10-CM | POA: Diagnosis not present

## 2018-04-30 DIAGNOSIS — D0461 Carcinoma in situ of skin of right upper limb, including shoulder: Secondary | ICD-10-CM | POA: Diagnosis not present

## 2018-04-30 DIAGNOSIS — L570 Actinic keratosis: Secondary | ICD-10-CM | POA: Diagnosis not present

## 2018-04-30 DIAGNOSIS — L400 Psoriasis vulgaris: Secondary | ICD-10-CM | POA: Diagnosis not present

## 2018-04-30 DIAGNOSIS — Z85828 Personal history of other malignant neoplasm of skin: Secondary | ICD-10-CM | POA: Diagnosis not present

## 2018-04-30 DIAGNOSIS — L821 Other seborrheic keratosis: Secondary | ICD-10-CM | POA: Diagnosis not present

## 2018-05-18 ENCOUNTER — Other Ambulatory Visit: Payer: Self-pay | Admitting: Internal Medicine

## 2018-06-02 DIAGNOSIS — Z85828 Personal history of other malignant neoplasm of skin: Secondary | ICD-10-CM | POA: Diagnosis not present

## 2018-06-02 DIAGNOSIS — C44321 Squamous cell carcinoma of skin of nose: Secondary | ICD-10-CM | POA: Diagnosis not present

## 2018-06-15 DIAGNOSIS — E663 Overweight: Secondary | ICD-10-CM | POA: Diagnosis not present

## 2018-06-15 DIAGNOSIS — M1990 Unspecified osteoarthritis, unspecified site: Secondary | ICD-10-CM | POA: Diagnosis not present

## 2018-06-15 DIAGNOSIS — M150 Primary generalized (osteo)arthritis: Secondary | ICD-10-CM | POA: Diagnosis not present

## 2018-06-15 DIAGNOSIS — M5416 Radiculopathy, lumbar region: Secondary | ICD-10-CM | POA: Diagnosis not present

## 2018-07-12 ENCOUNTER — Telehealth: Payer: Self-pay | Admitting: *Deleted

## 2018-07-12 DIAGNOSIS — M2040 Other hammer toe(s) (acquired), unspecified foot: Secondary | ICD-10-CM

## 2018-07-12 DIAGNOSIS — M79672 Pain in left foot: Secondary | ICD-10-CM

## 2018-07-12 DIAGNOSIS — M79671 Pain in right foot: Secondary | ICD-10-CM

## 2018-07-12 NOTE — Telephone Encounter (Signed)
Referral entered  

## 2018-07-12 NOTE — Telephone Encounter (Signed)
Patient called and stated that she has a appointment with Dr. Coralie Common at [facility] for Monday 07/12/18 but his office requires a referral. Patient is going for feet pain and hammer toe. Please Advise.

## 2018-07-27 ENCOUNTER — Ambulatory Visit: Payer: Medicare Other | Admitting: Podiatry

## 2018-07-27 ENCOUNTER — Ambulatory Visit (INDEPENDENT_AMBULATORY_CARE_PROVIDER_SITE_OTHER): Payer: Medicare Other

## 2018-07-27 VITALS — BP 102/68 | HR 69

## 2018-07-27 DIAGNOSIS — M2041 Other hammer toe(s) (acquired), right foot: Secondary | ICD-10-CM | POA: Diagnosis not present

## 2018-07-27 DIAGNOSIS — M2042 Other hammer toe(s) (acquired), left foot: Secondary | ICD-10-CM

## 2018-07-27 NOTE — Progress Notes (Signed)
Subjective:   Patient ID: [name], female   DOB: 84 y.o.   MRN: [id number]   HPI 84 year old female presents the office today with concerns of her second toes crossing over with the right side worse than the left.  She states makes it difficult to walk and she needs to do something to try to help uncross her toes.  She said that she has hit her toes quite a bit she has a small amount of bruising to her left third toe.  This is been ongoing for last 5 years.  She had no recent treatment.   Review of Systems  All other systems reviewed and are negative.  Past Medical History:  Diagnosis Date  . Arthritis    Osteoarthritis  . Depression   . GERD (gastroesophageal reflux disease)   . Hyperlipidemia   . Osteopenia     Past Surgical History:  Procedure Laterality Date  . APPENDECTOMY  1936  . CERVICAL FUSION  2004  . COLON SURGERY  06/12/2010   Colonoscopy  . FINGER SURGERY  1989  . MELANOMA EXCISION  11/16/2017   forehead  . SPINE SURGERY  2009   Back Fusion  . TONSILLECTOMY  1941     Current Outpatient Medications:  .  alendronate (FOSAMAX) 70 MG tablet, Take one tablet by mouth weekly.  Stay upright for 1 hour after taking., Disp: 12 tablet, Rfl: 3 .  brimonidine (ALPHAGAN) 0.15 % ophthalmic solution, INT 1 GTT INTO OU BID, Disp: , Rfl: 3 .  cholecalciferol (VITAMIN D) 1000 UNITS tablet, Take 1,000 Units by mouth 2 (two) times daily. , Disp: , Rfl:  .  diclofenac (VOLTAREN) 50 MG EC tablet, Take 1 tablet (50 mg total) by mouth 3 (three) times daily. With food, Disp: 60 tablet, Rfl: 3 .  dorzolamide-timolol (COSOPT) 22.3-6.8 MG/ML ophthalmic solution, Place 1 drop into both eyes daily., Disp: , Rfl:  .  doxycycline (VIBRAMYCIN) 100 MG capsule, , Disp: , Rfl: 0 .  FLUoxetine (PROZAC) 20 MG tablet, TAKE 1 TABLET BY MOUTH DAILY. GENERIC EQUIVALENT FOR PROZAC, Disp: 90 tablet, Rfl: 1 .  gabapentin (NEURONTIN) 300 MG capsule, Take 600 mg by mouth at bedtime. , Disp: , Rfl: 2 .   latanoprost (XALATAN) 0.005 % ophthalmic solution, Place 1 drop into both eyes at bedtime. , Disp: , Rfl:  .  lisinopril-hydrochlorothiazide (PRINZIDE,ZESTORETIC) 20-12.5 MG tablet, TAKE 1 TABLET BY MOUTH DAILY, Disp: 90 tablet, Rfl: 0 .  oxyCODONE-acetaminophen (PERCOCET/ROXICET) 5-325 MG per tablet, Take 1 tablet by mouth 3 (three) times daily as needed for pain. , Disp: , Rfl:  .  pantoprazole (PROTONIX) 40 MG tablet, TAKE 1 TABLET BY MOUTH EVERY DAY FOR ACID REFLUX GENERIC EQUIVALENT FOR PROTONIX, Disp: 90 tablet, Rfl: 3 .  predniSONE (DELTASONE) 1 MG tablet, 3 tablets Once a day Orally 90 days, Disp: , Rfl: 1 .  simvastatin (ZOCOR) 20 MG tablet, Take one tablet by mouth once daily for choelsterol, Disp: 90 tablet, Rfl: 3  Allergies  Allergen Reactions  . Penicillins Rash          Objective:  Physical Exam  General: AAO x3, NAD  Dermatological: Mild ecchymosis present to the dorsal left third toe but no open lesions identified to bilateral lower extremities.  Vascular: Dorsalis Pedis artery and Posterior Tibial artery pedal pulses are palpable  bilateral with immedate capillary fill time. There is no pain with calf compression, swelling, warmth, erythema.   Neruologic: Grossly intact via light touch bilateral.  Protective threshold with Semmes Wienstein monofilament intact to all pedal sites bilateral.   Musculoskeletal: Hammertoe is present.  Bony deformities present on the right side there is overlapping second toe present.  There is no pain to the toes identified today and specifically on the left third toe over the area of bruising there is no pain.  Muscular strength 5/5 in all groups tested bilateral.     Assessment:   Bilateral digital deformity     Plan:  -Treatment options discussed including all alternatives, risks, and complications -Etiology of symptoms were discussed -X-rays were obtained and reviewed with the patient.  Arthritis are present which has been no  definitive evidence of acute fracture identified. -We discussed various mechanisms to help strengthen her toes.  Also we discussed wearing supportive shoes.  Dispensed offloading pads to help separate her toes.  Trula Slade DPM

## 2018-07-30 ENCOUNTER — Encounter: Payer: Self-pay | Admitting: Internal Medicine

## 2018-07-30 DIAGNOSIS — D045 Carcinoma in situ of skin of trunk: Secondary | ICD-10-CM | POA: Diagnosis not present

## 2018-07-30 DIAGNOSIS — L570 Actinic keratosis: Secondary | ICD-10-CM | POA: Diagnosis not present

## 2018-07-30 DIAGNOSIS — D485 Neoplasm of uncertain behavior of skin: Secondary | ICD-10-CM | POA: Diagnosis not present

## 2018-07-30 DIAGNOSIS — B078 Other viral warts: Secondary | ICD-10-CM | POA: Diagnosis not present

## 2018-07-30 DIAGNOSIS — Z85828 Personal history of other malignant neoplasm of skin: Secondary | ICD-10-CM | POA: Diagnosis not present

## 2018-07-30 DIAGNOSIS — L820 Inflamed seborrheic keratosis: Secondary | ICD-10-CM | POA: Diagnosis not present

## 2018-07-30 DIAGNOSIS — L814 Other melanin hyperpigmentation: Secondary | ICD-10-CM | POA: Diagnosis not present

## 2018-08-05 ENCOUNTER — Ambulatory Visit: Payer: Medicare Other | Admitting: Internal Medicine

## 2018-08-08 ENCOUNTER — Other Ambulatory Visit (INDEPENDENT_AMBULATORY_CARE_PROVIDER_SITE_OTHER): Payer: Self-pay | Admitting: Specialist

## 2018-08-08 DIAGNOSIS — G8929 Other chronic pain: Secondary | ICD-10-CM

## 2018-08-08 DIAGNOSIS — M5441 Lumbago with sciatica, right side: Principal | ICD-10-CM

## 2018-08-09 NOTE — Telephone Encounter (Signed)
Diclofenac refill request 

## 2018-08-30 ENCOUNTER — Other Ambulatory Visit: Payer: Self-pay | Admitting: *Deleted

## 2018-08-30 ENCOUNTER — Other Ambulatory Visit: Payer: Self-pay | Admitting: Internal Medicine

## 2018-08-30 MED ORDER — LISINOPRIL-HYDROCHLOROTHIAZIDE 20-12.5 MG PO TABS: 1.0000 | ORAL_TABLET | Freq: Every day | ORAL | 0 refills | Status: DC

## 2018-08-30 NOTE — Telephone Encounter (Signed)
Patient requested a small supply to be sent to local pharmacy until she can get her mail order Rx. Faxed.

## 2018-10-03 ENCOUNTER — Other Ambulatory Visit: Payer: Self-pay | Admitting: Internal Medicine

## 2018-10-11 ENCOUNTER — Encounter: Payer: Self-pay | Admitting: Internal Medicine

## 2018-10-11 ENCOUNTER — Ambulatory Visit (INDEPENDENT_AMBULATORY_CARE_PROVIDER_SITE_OTHER): Payer: Medicare Other | Admitting: Internal Medicine

## 2018-10-11 VITALS — BP 110/68 | HR 78 | Temp 98.0°F | Ht 60.0 in | Wt 134.0 lb

## 2018-10-11 DIAGNOSIS — E785 Hyperlipidemia, unspecified: Secondary | ICD-10-CM

## 2018-10-11 DIAGNOSIS — M25511 Pain in right shoulder: Secondary | ICD-10-CM

## 2018-10-11 DIAGNOSIS — I10 Essential (primary) hypertension: Secondary | ICD-10-CM

## 2018-10-11 DIAGNOSIS — H401130 Primary open-angle glaucoma, bilateral, stage unspecified: Secondary | ICD-10-CM

## 2018-10-11 DIAGNOSIS — R739 Hyperglycemia, unspecified: Secondary | ICD-10-CM

## 2018-10-11 DIAGNOSIS — N3281 Overactive bladder: Secondary | ICD-10-CM

## 2018-10-11 DIAGNOSIS — E781 Pure hyperglyceridemia: Secondary | ICD-10-CM

## 2018-10-11 DIAGNOSIS — G8929 Other chronic pain: Secondary | ICD-10-CM

## 2018-10-11 DIAGNOSIS — M159 Polyosteoarthritis, unspecified: Secondary | ICD-10-CM

## 2018-10-11 DIAGNOSIS — E1169 Type 2 diabetes mellitus with other specified complication: Secondary | ICD-10-CM | POA: Diagnosis not present

## 2018-10-11 MED ORDER — LISINOPRIL 20 MG PO TABS: 20.0000 mg | ORAL_TABLET | Freq: Every day | ORAL | 3 refills | Status: DC
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
## 2018-10-11 NOTE — Patient Instructions (Addendum)
Cut prozac in 1/2 for 2 weeks, then take a 1/2 tab every other day.  If your mood is affected, call me back.  Stop lisinopril/hctz.  Start just plain lisinopril 20mg  daily.  Let me know if your legs swell.    Unfortunately, with your glaucoma, we're out of ideas for your bladder outside of the diapers and pads.  Be careful with the pumpkin rolls and ice cream.    Be sure to ask Dr. Katy Fitch to send me a report.

## 2018-10-12 DIAGNOSIS — H401122 Primary open-angle glaucoma, left eye, moderate stage: Secondary | ICD-10-CM | POA: Diagnosis not present

## 2018-10-12 DIAGNOSIS — H401113 Primary open-angle glaucoma, right eye, severe stage: Secondary | ICD-10-CM | POA: Diagnosis not present

## 2018-10-12 DIAGNOSIS — Z981 Arthrodesis status: Secondary | ICD-10-CM | POA: Diagnosis not present

## 2018-10-12 DIAGNOSIS — H43813 Vitreous degeneration, bilateral: Secondary | ICD-10-CM | POA: Diagnosis not present

## 2018-10-12 LAB — COMPLETE METABOLIC PANEL WITH GFR
AG Ratio: 1.8 (calc) (ref 1.0–2.5)
ALT: 13 U/L (ref 6–29)
AST: 15 U/L (ref 10–35)
Albumin: 4.1 g/dL (ref 3.6–5.1)
Alkaline phosphatase (APISO): 64 U/L (ref 37–153)
BUN: 13 mg/dL (ref 7–25)
CO2: 28 mmol/L (ref 20–32)
Calcium: 10 mg/dL (ref 8.6–10.4)
Chloride: 104 mmol/L (ref 98–110)
Creat: 0.61 mg/dL (ref 0.60–0.88)
GFR, Est African American: 93 mL/min/{1.73_m2} (ref >=60)
GFR, Est Non African American: 80 mL/min/{1.73_m2} (ref >=60)
Globulin: 2.3 g/dL (calc) (ref 1.9–3.7)
Glucose, Bld: 89 mg/dL (ref 65–139)
Potassium: 4 mmol/L (ref 3.5–5.3)
Sodium: 141 mmol/L (ref 135–146)
Total Bilirubin: 0.4 mg/dL (ref 0.2–1.2)
Total Protein: 6.4 g/dL (ref 6.1–8.1)

## 2018-10-12 LAB — CBC WITH DIFFERENTIAL/PLATELET
Absolute Monocytes: 695 cells/uL (ref 200–950)
Basophils Absolute: 70 cells/uL (ref 0–200)
Basophils Relative: 0.8 %
Eosinophils Absolute: 158 cells/uL (ref 15–500)
Eosinophils Relative: 1.8 %
HCT: 40.3 % (ref 35.0–45.0)
Hemoglobin: 13.3 g/dL (ref 11.7–15.5)
Lymphs Abs: 1945 cells/uL (ref 850–3900)
MCH: 28 pg (ref 27.0–33.0)
MCHC: 33 g/dL (ref 32.0–36.0)
MCV: 84.8 fL (ref 80.0–100.0)
MPV: 10.7 fL (ref 7.5–12.5)
Monocytes Relative: 7.9 %
Neutro Abs: 5931 cells/uL (ref 1500–7800)
Neutrophils Relative %: 67.4 %
Platelets: 267 10*3/uL (ref 140–400)
RBC: 4.75 10*6/uL (ref 3.80–5.10)
RDW: 13.5 % (ref 11.0–15.0)
Total Lymphocyte: 22.1 %
WBC: 8.8 10*3/uL (ref 3.8–10.8)

## 2018-10-12 LAB — LIPID PANEL
Cholesterol: 279 mg/dL — ABNORMAL HIGH (ref <200)
HDL: 66 mg/dL (ref >=50)
LDL Cholesterol (Calc): 167 mg/dL (calc) — ABNORMAL HIGH
Non-HDL Cholesterol (Calc): 213 mg/dL (calc) — ABNORMAL HIGH (ref <130)
Total CHOL/HDL Ratio: 4.2 (calc) (ref <5.0)
Triglycerides: 283 mg/dL — ABNORMAL HIGH (ref <150)

## 2018-10-12 LAB — HEMOGLOBIN A1C
Hgb A1c MFr Bld: 5.8 % of total Hgb — ABNORMAL HIGH (ref <5.7)
Mean Plasma Glucose: 120 (calc)
eAG (mmol/L): 6.6 (calc)

## 2018-10-15 ENCOUNTER — Encounter (INDEPENDENT_AMBULATORY_CARE_PROVIDER_SITE_OTHER): Payer: Self-pay | Admitting: Specialist

## 2018-10-15 ENCOUNTER — Ambulatory Visit (INDEPENDENT_AMBULATORY_CARE_PROVIDER_SITE_OTHER): Payer: Medicare Other | Admitting: Specialist

## 2018-10-15 ENCOUNTER — Telehealth: Payer: Self-pay | Admitting: *Deleted

## 2018-10-15 VITALS — BP 117/64 | HR 66 | Ht 60.0 in | Wt 134.0 lb

## 2018-10-15 DIAGNOSIS — R2689 Other abnormalities of gait and mobility: Secondary | ICD-10-CM | POA: Diagnosis not present

## 2018-10-15 DIAGNOSIS — M48062 Spinal stenosis, lumbar region with neurogenic claudication: Secondary | ICD-10-CM | POA: Diagnosis not present

## 2018-10-15 NOTE — Patient Instructions (Signed)
Plan: Avoid bending, stooping and avoid lifting weights greater than 10 lbs. Avoid prolong standing and walking. Avoid frequent bending and stooping  No lifting greater than 10 lbs. May use ice or moist heat for pain. Weight loss is of benefit. Handicap license is approved. 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

## 2018-10-15 NOTE — Progress Notes (Signed)
Office Visit Note   Patient: [name]           Date of Birth: 13-Dec-1926           MRN: [id number] Visit Date: 10/15/2018              Requested by: Gayland Curry, DO [place], [place] [address] PCP: Gayland Curry, DO   Assessment & Plan: Visit Diagnoses:  1. Spinal stenosis of lumbar region with neurogenic claudication   2. Balance disorder     Plan: Avoid bending, stooping and avoid lifting weights greater than 10 lbs. Avoid prolong standing and walking. Avoid frequent bending and stooping  No lifting greater than 10 lbs. May use ice or moist heat for pain. Weight loss is of benefit. Handicap license is approved. 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   Follow-Up Instructions: Return in about 3 months (around 01/13/2019).   Orders:  No orders of the defined types were placed in this encounter.  No orders of the defined types were placed in this encounter.     Procedures: No procedures performed   Clinical Data: No additional findings.   Subjective: Chief Complaint  Patient presents with  . Lower Back - Follow-up    85 year old female with history of degenerative thoracolumbar scoliosis with persistent pain in her legs with standing and walking. Pain is into the right anterior thigh to the right knee. There is some pain in the left leg anterior shin. Pain increases with standing and walking. Trouble just walking down the hall and keeping her balance. She has a walker with a seat and brakes. Has bladder difficulty, and has to use a pad during the day and a diaper at night. Hard to give meds for  Bladder weakness at night or otherwise as these may affect the glaucoma. No numbness right or left leg. Toes feel numbness right greater than left.    Review of Systems  Constitutional: Negative.   HENT: Negative.   Eyes: Negative.   Respiratory:  Negative.   Cardiovascular: Negative.   Gastrointestinal: Negative.   Endocrine: Negative.   Genitourinary: Negative.   Musculoskeletal: Negative.   Skin: Negative.   Allergic/Immunologic: Negative.   Neurological: Negative.   Hematological: Negative.   Psychiatric/Behavioral: Negative.      Objective: Vital Signs: BP 117/64 (BP Location: Left Arm, Patient Position: Sitting)   Pulse 66   Ht 5' (1.524 m)   Wt 134 lb (60.8 kg)   BMI 26.17 kg/m   Physical Exam  Back Exam   Tenderness  The patient is experiencing tenderness in the lumbar.  Range of Motion  Extension: abnormal  Flexion: abnormal  Lateral bend right: abnormal  Lateral bend left: abnormal  Rotation right: abnormal  Rotation left: abnormal   Muscle Strength  Right Quadriceps:  5/5  Left Quadriceps:  5/5  Right Hamstrings:  5/5  Left Hamstrings:  5/5   Tests  Straight leg raise right: negative Straight leg raise left: negative  Reflexes  Patellar: 2/4 Achilles: 2/4 Babinski's sign: normal   Other  Toe walk: normal Heel walk: normal Sensation: normal Gait: normal  Erythema: no back redness Scars: absent  Comments:  Right foot DF weakness.      Specialty Comments:  No specialty comments available.  Imaging: No results found.   PMFS History: Patient Active Problem List  Diagnosis Date Noted  . Hyperlipidemia associated with type 2 diabetes mellitus ([place]) 10/11/2018  . Need for influenza vaccination 04/01/2018  . Malignant melanoma of left temple ([place]) 11/26/2017  . Chronic venous insufficiency 07/26/2015  . Urge incontinence of urine 07/26/2015  . Depression 07/26/2015  . Glaucoma 07/26/2015  . Postmenopausal estrogen deficiency 07/26/2015  . Primary open angle glaucoma 01/02/2014  . Hyperglycemia 11/18/2012  . Hypertriglyceridemia, essential 11/18/2012  . Essential hypertension, benign 11/18/2012  . Peripheral neuropathy 11/18/2012   Past Medical History:  Diagnosis  Date  . Arthritis    Osteoarthritis  . Depression   . GERD (gastroesophageal reflux disease)   . Hyperlipidemia   . Osteopenia     History reviewed. No pertinent family history.  Past Surgical History:  Procedure Laterality Date  . APPENDECTOMY  1936  . CERVICAL FUSION  2004  . COLON SURGERY  06/12/2010   Colonoscopy  . FINGER SURGERY  1989  . MELANOMA EXCISION  11/16/2017   forehead  . SPINE SURGERY  2009   Back Fusion  . TONSILLECTOMY  1941   Social History   Occupational History  . Not on file  Tobacco Use  . Smoking status: Former Research scientist (life sciences)  . Smokeless tobacco: Never Used  Substance and Sexual Activity  . Alcohol use: No    Alcohol/week: 0.0 standard drinks    Comment: rare  . Drug use: No  . Sexual activity: Never

## 2018-10-18 DIAGNOSIS — M1990 Unspecified osteoarthritis, unspecified site: Secondary | ICD-10-CM | POA: Diagnosis not present

## 2018-10-18 DIAGNOSIS — E663 Overweight: Secondary | ICD-10-CM | POA: Diagnosis not present

## 2018-10-18 DIAGNOSIS — M150 Primary generalized (osteo)arthritis: Secondary | ICD-10-CM | POA: Diagnosis not present

## 2018-10-18 DIAGNOSIS — Z6826 Body mass index (BMI) 26.0-26.9, adult: Secondary | ICD-10-CM | POA: Diagnosis not present

## 2018-10-18 NOTE — Telephone Encounter (Signed)
A user error has taken place: encounter opened in error, closed for administrative reasons.

## 2018-10-19 ENCOUNTER — Telehealth: Payer: Self-pay | Admitting: *Deleted

## 2018-10-19 MED ORDER — SIMVASTATIN 80 MG PO TABS: 80.0000 mg | ORAL_TABLET | Freq: Every day | ORAL | 1 refills | Status: DC

## 2018-10-19 NOTE — Telephone Encounter (Signed)
-----   Message from Gayland Curry, DO sent at 10/12/2018 10:03 AM EST ----- Sugar average is stable, but cholesterol levels continue to trend up with her food choices.  Let's increase her zocor to 80mg  to get this number down since I know she does not want to change her eating habits and at these levels I'm concerned she will have a heart attack or stroke.

## 2018-10-19 NOTE — Telephone Encounter (Signed)
Patient returning Dee's call regarding Blood work Results.  Patient Notified and agreed.  Medication list updated and Rx sent to Pharmacy.

## 2018-11-02 DIAGNOSIS — Z961 Presence of intraocular lens: Secondary | ICD-10-CM | POA: Diagnosis not present

## 2018-11-02 DIAGNOSIS — H401122 Primary open-angle glaucoma, left eye, moderate stage: Secondary | ICD-10-CM | POA: Diagnosis not present

## 2018-11-02 DIAGNOSIS — H43813 Vitreous degeneration, bilateral: Secondary | ICD-10-CM | POA: Diagnosis not present

## 2018-11-02 DIAGNOSIS — H401113 Primary open-angle glaucoma, right eye, severe stage: Secondary | ICD-10-CM | POA: Diagnosis not present

## 2018-12-12 ENCOUNTER — Encounter: Payer: Self-pay | Admitting: Internal Medicine

## 2018-12-23 ENCOUNTER — Telehealth: Payer: Self-pay | Admitting: *Deleted

## 2018-12-23 DIAGNOSIS — N3281 Overactive bladder: Secondary | ICD-10-CM

## 2018-12-23 DIAGNOSIS — R32 Unspecified urinary incontinence: Secondary | ICD-10-CM

## 2018-12-23 DIAGNOSIS — R350 Frequency of micturition: Secondary | ICD-10-CM

## 2018-12-23 NOTE — Telephone Encounter (Signed)
Patient called and stated that she is having increased incontinence during the night. Stated that she has to change her depends 2-3 times a night on top of getting up and going to the bathroom. Stated that she is good during the daytime. Stated that you had tried her on Myrbetriq and that didn't help and had stopped the diuretic and it didn't help either. Stated that she has tried not to drink in the evening and it doesn't help either. Please Advise.

## 2018-12-23 NOTE — Telephone Encounter (Signed)
At this point, I think she needs to see the urologist.  We will need to place a referral to [facility].

## 2018-12-24 NOTE — Telephone Encounter (Signed)
Spoke with patient, patient in agreement to see Urologist. Referral order placed, standing order with cosign required to assure appropriate diagnosis associated with referral.

## 2019-01-13 ENCOUNTER — Ambulatory Visit (INDEPENDENT_AMBULATORY_CARE_PROVIDER_SITE_OTHER): Payer: Medicare Other | Admitting: Specialist

## 2019-01-14 ENCOUNTER — Ambulatory Visit: Payer: Self-pay | Admitting: Specialist

## 2019-02-03 ENCOUNTER — Ambulatory Visit: Payer: Medicare Other | Admitting: Internal Medicine

## 2019-03-16 DIAGNOSIS — C44722 Squamous cell carcinoma of skin of right lower limb, including hip: Secondary | ICD-10-CM | POA: Diagnosis not present

## 2019-03-16 DIAGNOSIS — L820 Inflamed seborrheic keratosis: Secondary | ICD-10-CM | POA: Diagnosis not present

## 2019-03-16 DIAGNOSIS — Z85828 Personal history of other malignant neoplasm of skin: Secondary | ICD-10-CM | POA: Diagnosis not present

## 2019-03-16 DIAGNOSIS — L570 Actinic keratosis: Secondary | ICD-10-CM | POA: Diagnosis not present

## 2019-03-16 DIAGNOSIS — D485 Neoplasm of uncertain behavior of skin: Secondary | ICD-10-CM | POA: Diagnosis not present

## 2019-03-16 DIAGNOSIS — L905 Scar conditions and fibrosis of skin: Secondary | ICD-10-CM | POA: Diagnosis not present

## 2019-03-24 ENCOUNTER — Ambulatory Visit (INDEPENDENT_AMBULATORY_CARE_PROVIDER_SITE_OTHER): Payer: Medicare Other | Admitting: Internal Medicine

## 2019-03-24 ENCOUNTER — Other Ambulatory Visit: Payer: Self-pay

## 2019-03-24 ENCOUNTER — Encounter: Payer: Self-pay | Admitting: Internal Medicine

## 2019-03-24 VITALS — BP 120/70 | HR 71 | Temp 97.8°F | Ht 60.0 in | Wt 135.0 lb

## 2019-03-24 DIAGNOSIS — E781 Pure hyperglyceridemia: Secondary | ICD-10-CM

## 2019-03-24 DIAGNOSIS — C4339 Malignant melanoma of other parts of face: Secondary | ICD-10-CM

## 2019-03-24 DIAGNOSIS — M159 Polyosteoarthritis, unspecified: Secondary | ICD-10-CM | POA: Diagnosis not present

## 2019-03-24 DIAGNOSIS — R739 Hyperglycemia, unspecified: Secondary | ICD-10-CM

## 2019-03-24 DIAGNOSIS — F3341 Major depressive disorder, recurrent, in partial remission: Secondary | ICD-10-CM | POA: Insufficient documentation

## 2019-03-24 DIAGNOSIS — I10 Essential (primary) hypertension: Secondary | ICD-10-CM

## 2019-03-24 DIAGNOSIS — F3342 Major depressive disorder, recurrent, in full remission: Secondary | ICD-10-CM

## 2019-03-24 DIAGNOSIS — N3941 Urge incontinence: Secondary | ICD-10-CM

## 2019-03-24 NOTE — Progress Notes (Signed)
Location:  [facility]  Advanced Directives 03/29/2018  Does Patient Have a Medical Advance Directive? Yes  Type of Advance Directive [place]  Does patient want to make changes to medical advance directive? No - Patient declined  Copy of [place] in Chart? Yes  Would patient like information on creating a medical advance directive? -     Chief Complaint  Patient presents with  . Medical Management of Chronic Issues    34mth follow-up    HPI: Patient is a 85 y.o. female seen today for medical management of chronic diseases.    She continues to have arthritic issues and is being follow by Dr. Louanne Skye. Her next appointment is tomorrow. Her right shoulder pain continues to trouble her. She takes hydrocodone when the pain is greater than 6/10. Her falls prevention interventions include using a walker and cane to ambulate around the house. One fall reported since last visit, no injury occurred. She wears her life alert daily. Her chronic back pain is ongoing, she sees   Urinary incontinence is still ongoing. She never followed up with her urology consult due to covid and improved management. Reduced fluid intake prior to bedtime has decreased incontinent issues.  She was scheduled to have eye surgery in May 2020, she has cancelled this procedure due to covid. She has trouble reading at times, but uses readers or a magnifying glass to read.   Last dental exam was last month, she continues to wear dentures without complaint.   Last dermatology appointment was 03/18/2019. She continues to have possible skin cancers removed. She had a pea sized area on her right lower leg biopsied.   She is having regular bowel movements unless she takes hydrocodone for pain. The hydrocodone continues to give her constipation. When this occurs she takes miralax to help.   She is getting adequate sleep with the use of melatonin at night.   She is social distancing due to  covid. The lack of interaction has not made her depressed or anxious.          Past Medical History:  Diagnosis Date  . Arthritis    Osteoarthritis  . Depression   . GERD (gastroesophageal reflux disease)   . Hyperlipidemia   . Osteopenia     Past Surgical History:  Procedure Laterality Date  . APPENDECTOMY  1936  . CERVICAL FUSION  2004  . COLON SURGERY  06/12/2010   Colonoscopy  . FINGER SURGERY  1989  . MELANOMA EXCISION  11/16/2017   forehead  . SPINE SURGERY  2009   Back Fusion  . TONSILLECTOMY  1941    Allergies  Allergen Reactions  . Penicillins Rash    Outpatient Encounter Medications as of 03/24/2019  Medication Sig  . brimonidine (ALPHAGAN) 0.15 % ophthalmic solution INT 1 GTT INTO OU BID  . cholecalciferol (VITAMIN D) 1000 UNITS tablet Take 1,000 Units by mouth 2 (two) times daily.   . diclofenac (VOLTAREN) 50 MG EC tablet TAKE 1 TABLET BY MOUTH 3 TIMES DAILY WITH FOOD  . dorzolamide-timolol (COSOPT) 22.3-6.8 MG/ML ophthalmic solution Place 1 drop into both eyes daily.  Marland Kitchen gabapentin (NEURONTIN) 300 MG capsule Take 600 mg by mouth at bedtime.   Marland Kitchen latanoprost (XALATAN) 0.005 % ophthalmic solution Place 1 drop into both eyes at bedtime.   Marland Kitchen lisinopril (PRINIVIL,ZESTRIL) 20 MG tablet Take 1 tablet (20 mg total) by mouth daily.  . pantoprazole (PROTONIX) 40 MG tablet TAKE 1 TABLET BY  MOUTH EVERY DAY FOR ACID REFLUX GENERIC EQUIVALENT FOR PROTONIX  . simvastatin (ZOCOR) 80 MG tablet Take 1 tablet (80 mg total) by mouth daily.   No facility-administered encounter medications on file as of 03/24/2019.     Review of Systems:  Review of Systems  Constitutional: Negative for activity change, appetite change and unexpected weight change.  HENT: Negative for dental problem, hearing loss and trouble swallowing.   Eyes: Negative for photophobia and visual disturbance.  Respiratory: Negative for cough, shortness of breath and wheezing.   Cardiovascular: Negative for  chest pain, palpitations and leg swelling.  Gastrointestinal: Positive for constipation. Negative for diarrhea and nausea.  Genitourinary: Negative for dysuria, frequency, hematuria and vaginal bleeding.  Musculoskeletal: Positive for arthralgias, back pain, gait problem, joint swelling and myalgias.  Skin:       Skin cancer removed on right lower leg 03/18/2019  Neurological: Negative for dizziness, light-headedness and headaches.  Psychiatric/Behavioral: Positive for sleep disturbance. Negative for dysphoric mood. The patient is not nervous/anxious.     Health Maintenance  Topic Date Due  . FOOT EXAM  02/12/1939  . OPHTHALMOLOGY EXAM  02/12/1939  . INFLUENZA VACCINE  03/19/2019  . HEMOGLOBIN A1C  04/11/2019  . TETANUS/TDAP  03/03/2024  . DEXA SCAN  Completed  . PNA vac Low Risk Adult  Completed    Physical Exam: Vitals:   03/24/19 1100  BP: 120/70  Pulse: 71  Temp: 97.8 F (36.6 C)  TempSrc: Oral  SpO2: 97%  Weight: 135 lb (61.2 kg)  Height: 5' (1.524 m)   Body mass index is 26.37 kg/m. Physical Exam Constitutional:      General: She is not in acute distress.    Appearance: Normal appearance. She is normal weight.  HENT:     Head: Normocephalic.     Mouth/Throat:     Mouth: Mucous membranes are moist.  Neck:     Musculoskeletal: Normal range of motion and neck supple.  Cardiovascular:     Rate and Rhythm: Normal rate and regular rhythm.     Pulses: Normal pulses.     Heart sounds: Normal heart sounds. No murmur.  Pulmonary:     Effort: Pulmonary effort is normal. No respiratory distress.     Breath sounds: Normal breath sounds. No wheezing.  Abdominal:     General: Bowel sounds are normal.     Palpations: Abdomen is soft.  Musculoskeletal:        General: Tenderness present.     Right shoulder: She exhibits tenderness and pain. She exhibits no crepitus.     Right lower leg: No edema.     Left lower leg: No edema.  Skin:    General: Skin is warm and dry.      Capillary Refill: Capillary refill takes less than 2 seconds.  Neurological:     General: No focal deficit present.     Mental Status: She is alert and oriented to person, place, and time.     Sensory: No sensory deficit.     Coordination: Coordination normal.     Gait: Gait abnormal.  Psychiatric:        Mood and Affect: Mood normal.        Behavior: Behavior normal.     Labs reviewed: Basic Metabolic Panel: Recent Labs    03/29/18 1054 10/11/18 1200  NA 142 141  K 4.3 4.0  CL 105 104  CO2 30 28  GLUCOSE 90 89  BUN 15 13  CREATININE 0.78  0.61  CALCIUM 9.3 10.0   Liver Function Tests: Recent Labs    03/29/18 1054 10/11/18 1200  AST 15 15  ALT 13 13  BILITOT 0.4 0.4  PROT 5.9* 6.4   No results for input(s): LIPASE, AMYLASE in the last 8760 hours. No results for input(s): AMMONIA in the last 8760 hours. CBC: Recent Labs    03/29/18 1054 10/11/18 1200  WBC 6.6 8.8  NEUTROABS 3,835 5,931  HGB 12.4 13.3  HCT 38.3 40.3  MCV 87.0 84.8  PLT 205 267   Lipid Panel: Recent Labs    03/29/18 1054 10/11/18 1200  CHOL 224* 279*  HDL 67 66  LDLCALC 123* 167*  TRIG 226* 283*  CHOLHDL 3.3 4.2   Lab Results  Component Value Date   HGBA1C 5.8 (H) 10/11/2018    Procedures since last visit: No results found.  Assessment/Plan 1. Generalized osteoarthritis - followed by Dr. Louanne Skye - discussed pain interventions and pain medications like tylenol, hydrocodone, topicals, voltaren and gabapentin  2. Right shoulder pain - ongoing, scheduled with Dr. Louanne Skye tomorrow - continue current pain regimen  3. Essential hypertension - bp at goal - conitnue lisinopril as prescribed - continue to follow a low sodium diet  4. Hypertriglyceridemia, essential - lipid panel- future - COMPLETE METABOLIC PANEL WITH GFR-future  5. Hyperglycemia - has not progressed since last visit - continue to watch foods high in sugar and carbs - COMPLETE METABOLIC PANEL WITH GFR-  future -hemoglobin a1c- future  6. Opioid induced contipation - encourage taking two ducolax when taking hydrocodone - encourage daily hydration  7. Overactive bladder - cannot take myrbetriq and other alternatives are contraindicated with her glaucoma diagnosis - decreased fluids prior to bedtime and using depends and pads have helped   8. Primary open angle glaucoma of both eyes, unspecified glaucoma stage - Followed by Dr. Katy Fitch - surgery in May cancelled due to covid   Labs/tests ordered:  CBC with differential/platelets, COMPLETE METABOLIC PANEL with GFR, lipid panel, hemoglobin a1c next appointment Next appt:  6 month follow up

## 2019-03-24 NOTE — Patient Instructions (Signed)
You may try taking two of the colace with your hydrocodone pills for your spinal stenosis.

## 2019-03-25 ENCOUNTER — Ambulatory Visit: Payer: Self-pay

## 2019-03-25 ENCOUNTER — Ambulatory Visit (INDEPENDENT_AMBULATORY_CARE_PROVIDER_SITE_OTHER): Payer: Medicare Other | Admitting: Specialist

## 2019-03-25 ENCOUNTER — Encounter: Payer: Self-pay | Admitting: Specialist

## 2019-03-25 ENCOUNTER — Telehealth: Payer: Self-pay | Admitting: Specialist

## 2019-03-25 VITALS — BP 125/76 | HR 67 | Ht 60.0 in | Wt 134.0 lb

## 2019-03-25 DIAGNOSIS — M25511 Pain in right shoulder: Secondary | ICD-10-CM

## 2019-03-25 DIAGNOSIS — M5136 Other intervertebral disc degeneration, lumbar region: Secondary | ICD-10-CM | POA: Diagnosis not present

## 2019-03-25 DIAGNOSIS — Z981 Arthrodesis status: Secondary | ICD-10-CM

## 2019-03-25 DIAGNOSIS — M48062 Spinal stenosis, lumbar region with neurogenic claudication: Secondary | ICD-10-CM

## 2019-03-25 DIAGNOSIS — M7581 Other shoulder lesions, right shoulder: Secondary | ICD-10-CM | POA: Diagnosis not present

## 2019-03-25 DIAGNOSIS — M778 Other enthesopathies, not elsewhere classified: Secondary | ICD-10-CM

## 2019-03-25 DIAGNOSIS — M19049 Primary osteoarthritis, unspecified hand: Secondary | ICD-10-CM

## 2019-03-25 MED ORDER — METHYLPREDNISOLONE ACETATE 40 MG/ML IJ SUSP
40.0000 mg | INTRAMUSCULAR | Status: AC | PRN
  Administered 2019-03-25: 40 mg via INTRA_ARTICULAR

## 2019-03-25 MED ORDER — BUPIVACAINE HCL 0.25 % IJ SOLN
4.0000 mL | INTRAMUSCULAR | Status: AC | PRN
  Administered 2019-03-25: 4 mL via INTRA_ARTICULAR

## 2019-03-25 MED ORDER — DICLOFENAC SODIUM 1 % TD GEL: 2.0000 g | Freq: Four times a day (QID) | TRANSDERMAL | 2 refills | Status: DC

## 2019-03-25 NOTE — Telephone Encounter (Signed)
Patient came in for her visit and wanted her billing statement printed out that shows her copays and out of pocket.  She was told by billing that we could do that here.  She would like for you to mail it to her.  CB#[phone].  Thank you.

## 2019-03-25 NOTE — Patient Instructions (Signed)
Plan: Avoid overhead lifting and overhead use of the arms. Do not lift greater than 10 lbs. Tylenol ES one every 6-8 hours for pain and inflamation. A home exercise program. Avoid bending, stooping and avoid lifting weights greater than 10 lbs. Avoid prolong standing and walking. Avoid frequent bending and stooping  No lifting greater than 10 lbs. May use ice or moist heat for pain. Weight loss is of benefit. Handicap license is approved.

## 2019-03-25 NOTE — Progress Notes (Signed)
Office Visit Note   Patient: [name]           Date of Birth: [birth year]           MRN: [id number] Visit Date: 03/25/2019              Requested by: Gayland Curry, DO [place],  [place] [id number] PCP: Gayland Curry, DO   Assessment & Plan: Visit Diagnoses:  1. Degenerative disc disease, lumbar   2. Acute pain of right shoulder   3. Shoulder tendonitis, right   4. History of lumbar spinal fusion   5. Spinal stenosis of lumbar region with neurogenic claudication     Plan: Avoid overhead lifting and overhead use of the arms. Do not lift greater than 10 lbs. Tylenol ES one every 6-8 hours for pain and inflamation. A home exercise program. Avoid bending, stooping and avoid lifting weights greater than 10 lbs. Avoid prolong standing and walking. Avoid frequent bending and stooping  No lifting greater than 10 lbs. May use ice or moist heat for pain. Weight loss is of benefit. Handicap license is approved.   Follow-Up Instructions: No follow-ups on file.   Orders:  Orders Placed This Encounter  Procedures  . Large Joint Inj: R subacromial bursa  . XR Lumbar Spine 2-3 Views  . XR Shoulder Right   No orders of the defined types were placed in this encounter.     Procedures: Large Joint Inj: R subacromial bursa on 03/25/2019 11:33 AM Indications: pain Details: 25 G 1.5 in needle, anterolateral approach  Arthrogram: No  Medications: 40 mg methylPREDNISolone acetate 40 MG/ML; 4 mL bupivacaine 0.25 % Outcome: tolerated well, no immediate complications  bandaid applied Procedure, treatment alternatives, risks and benefits explained, specific risks discussed. Consent was given by the patient. Immediately prior to procedure a time out was called to verify the correct patient, procedure, equipment, support staff and site/side marked as required. Patient was prepped and draped in the usual sterile fashion.       Clinical Data: No additional findings.    Subjective: Chief Complaint  Patient presents with  . Lower Back - Follow-up    85 year old female right handed with history of neurogenic claudication and is having difficulty with prolong standing and walking. Using the grocery cart and a walker usually. No bowel or bladder difficulty. Some night time urgency and uses a pad and this help.    Review of Systems  Constitutional: Negative.   HENT: Negative.   Eyes: Negative.   Respiratory: Negative.   Cardiovascular: Negative.   Gastrointestinal: Negative.   Endocrine: Negative.   Genitourinary: Negative.   Musculoskeletal: Negative.   Skin: Negative.   Allergic/Immunologic: Negative.   Neurological: Negative.   Hematological: Negative.   Psychiatric/Behavioral: Negative.      Objective: Vital Signs: BP 125/76   Pulse 67   Ht 5' (1.524 m)   Wt 134 lb (60.8 kg)   BMI 26.17 kg/m   Physical Exam Constitutional:      Appearance: She is well-developed.  HENT:     Head: Normocephalic and atraumatic.  Eyes:     Pupils: Pupils are equal, round, and reactive to light.  Neck:     Musculoskeletal: Normal range of motion and neck supple.  Pulmonary:     Effort: Pulmonary effort is normal.     Breath sounds: Normal breath sounds.  Abdominal:     General: Bowel sounds are normal.  Palpations: Abdomen is soft.  Skin:    General: Skin is warm and dry.  Neurological:     Mental Status: She is alert and oriented to person, place, and time.  Psychiatric:        Behavior: Behavior normal.        Thought Content: Thought content normal.        Judgment: Judgment normal.     Back Exam   Tenderness  The patient is experiencing tenderness in the lumbar.  Range of Motion  Extension: abnormal  Flexion: abnormal  Lateral bend right: abnormal  Lateral bend left: abnormal  Rotation right: abnormal   Muscle Strength  Right Quadriceps:  5/5  Left Quadriceps:  5/5  Right Hamstrings:  5/5  Left Hamstrings:  5/5    Tests  Straight leg raise right: negative Straight leg raise left: negative  Reflexes  Patellar: 2/4 Achilles: 2/4 Babinski's sign: normal   Other  Toe walk: normal Heel walk: normal Sensation: normal Gait: normal  Erythema: no back redness Scars: absent   Right Shoulder Exam   Tenderness  The patient is experiencing tenderness in the acromion.  Range of Motion  Active abduction: abnormal  Passive abduction: normal  Extension: normal  External rotation: abnormal  Forward flexion: normal  Internal rotation 0 degrees: abnormal  Internal rotation 90 degrees: abnormal   Muscle Strength  Abduction: 4/5  External rotation: 4/5  Supraspinatus: 5/5  Subscapularis: 5/5  Biceps: 5/5   Tests  Apprehension: negative Hawkins test: negative Cross arm: negative Impingement: positive Drop arm: negative Sulcus: absent  Other  Erythema: absent Scars: absent Sensation: normal Pulse: present      Specialty Comments:  No specialty comments available.  Imaging: Xr Lumbar Spine 2-3 Views  Result Date: 03/25/2019 Ap and lateral flexion and extension radiographs show severe DDD L1-2 and L2-3, Mild L3-4 with retrolisthesis at these levels, solid fusion L4-5 and L5-S1 with retained hardware. Interbody fusions at these levels are incorporated and healed. No acute changes.   Xr Shoulder Right  Result Date: 03/25/2019 Right shoulder AP, Lateral axillary and outlet views show the SAS is well maintained there is moderate DJD of the [facility] joint with spurs on both distal clavicle and medial acromiom. No G-H joint arthrosis.     PMFS History: Patient Active Problem List   Diagnosis Date Noted  . Recurrent major depressive disorder, in partial remission ([place]) 03/24/2019  . Generalized osteoarthritis of multiple sites 03/24/2019  . Hyperlipidemia associated with type 2 diabetes mellitus ([place]) 10/11/2018  . Need for influenza vaccination 04/01/2018  . Malignant melanoma of left  temple ([place]) 11/26/2017  . Chronic venous insufficiency 07/26/2015  . Urge incontinence of urine 07/26/2015  . Glaucoma 07/26/2015  . Postmenopausal estrogen deficiency 07/26/2015  . Primary open angle glaucoma 01/02/2014  . Hyperglycemia 11/18/2012  . Hypertriglyceridemia, essential 11/18/2012  . Essential hypertension, benign 11/18/2012  . Peripheral neuropathy 11/18/2012   Past Medical History:  Diagnosis Date  . Arthritis    Osteoarthritis  . Depression   . GERD (gastroesophageal reflux disease)   . Hyperlipidemia   . Osteopenia     History reviewed. No pertinent family history.  Past Surgical History:  Procedure Laterality Date  . APPENDECTOMY  1936  . CERVICAL FUSION  2004  . COLON SURGERY  06/12/2010   Colonoscopy  . FINGER SURGERY  1989  . MELANOMA EXCISION  11/16/2017   forehead  . SPINE SURGERY  2009   Back Fusion  . TONSILLECTOMY  1941   Social History   Occupational History  . Not on file  Tobacco Use  . Smoking status: Former Research scientist (life sciences)  . Smokeless tobacco: Never Used  Substance and Sexual Activity  . Alcohol use: No    Alcohol/week: 0.0 standard drinks    Comment: rare  . Drug use: No  . Sexual activity: Never

## 2019-03-29 DIAGNOSIS — E663 Overweight: Secondary | ICD-10-CM | POA: Diagnosis not present

## 2019-03-29 DIAGNOSIS — Z6826 Body mass index (BMI) 26.0-26.9, adult: Secondary | ICD-10-CM | POA: Diagnosis not present

## 2019-03-29 DIAGNOSIS — M150 Primary generalized (osteo)arthritis: Secondary | ICD-10-CM | POA: Diagnosis not present

## 2019-03-29 DIAGNOSIS — M1990 Unspecified osteoarthritis, unspecified site: Secondary | ICD-10-CM | POA: Diagnosis not present

## 2019-04-01 ENCOUNTER — Other Ambulatory Visit: Payer: Self-pay

## 2019-04-01 ENCOUNTER — Encounter: Payer: Self-pay | Admitting: Family

## 2019-04-01 ENCOUNTER — Encounter: Payer: Medicare Other | Admitting: Family

## 2019-04-01 ENCOUNTER — Telehealth: Payer: Self-pay

## 2019-04-01 ENCOUNTER — Ambulatory Visit: Payer: Self-pay

## 2019-04-01 NOTE — Progress Notes (Signed)
This service is provided via telemedicine  No vital signs collected/recorded due to the encounter was a telemedicine visit.   Location of patient (ex: home, work):  Home   Patient consents to a telephone visit:  Yes  Location of the provider (ex: office, home):  Office   Name of any referring provider: Dr Hollace Kinnier, DO  Names of all persons participating in the telemedicine service and their role in the encounter:  Marlowe Sax, NP, Ruthell Rummage CMA, Melford Aase   Time spent on call:  Ruthell Rummage CMA , spent  Minutes on phone with patient.

## 2019-04-01 NOTE — Telephone Encounter (Signed)
A 3rd and final attempt was made to reach patient. No answer. LMOM informed patient due to unavailability her appointment will be canceled and she will need to call us back to reschedule AWV.

## 2019-04-01 NOTE — Telephone Encounter (Signed)
Patient was scheduled for televisit with Marlowe Sax, NP today. Attempted to reach patient, no answer. LMOM for patient to return call.

## 2019-04-01 NOTE — Telephone Encounter (Signed)
Another attempt was made to reach patient regarding her televisit scheduled today with Dinah. No answer. LMOM. I will try a 3rd attempt to reach patient and if not able to reach her appointment will have to be canceled and rescheduled.

## 2019-04-06 NOTE — Progress Notes (Signed)
This encounter was created in error - please disregard.

## 2019-04-13 ENCOUNTER — Other Ambulatory Visit: Payer: Self-pay | Admitting: Internal Medicine

## 2019-05-24 ENCOUNTER — Ambulatory Visit (INDEPENDENT_AMBULATORY_CARE_PROVIDER_SITE_OTHER): Payer: Medicare Other

## 2019-05-24 ENCOUNTER — Other Ambulatory Visit: Payer: Self-pay

## 2019-05-24 DIAGNOSIS — H401122 Primary open-angle glaucoma, left eye, moderate stage: Secondary | ICD-10-CM | POA: Diagnosis not present

## 2019-05-24 DIAGNOSIS — Z961 Presence of intraocular lens: Secondary | ICD-10-CM | POA: Diagnosis not present

## 2019-05-24 DIAGNOSIS — Z23 Encounter for immunization: Secondary | ICD-10-CM

## 2019-05-24 DIAGNOSIS — H43813 Vitreous degeneration, bilateral: Secondary | ICD-10-CM | POA: Diagnosis not present

## 2019-05-24 DIAGNOSIS — H401113 Primary open-angle glaucoma, right eye, severe stage: Secondary | ICD-10-CM | POA: Diagnosis not present

## 2019-06-30 ENCOUNTER — Ambulatory Visit (INDEPENDENT_AMBULATORY_CARE_PROVIDER_SITE_OTHER): Payer: Medicare Other | Admitting: Specialist

## 2019-06-30 ENCOUNTER — Other Ambulatory Visit: Payer: Self-pay

## 2019-06-30 ENCOUNTER — Encounter: Payer: Self-pay | Admitting: Specialist

## 2019-06-30 VITALS — BP 140/87 | HR 74 | Ht 60.0 in | Wt 134.0 lb

## 2019-06-30 DIAGNOSIS — M25511 Pain in right shoulder: Secondary | ICD-10-CM

## 2019-06-30 DIAGNOSIS — M778 Other enthesopathies, not elsewhere classified: Secondary | ICD-10-CM | POA: Diagnosis not present

## 2019-06-30 DIAGNOSIS — M47812 Spondylosis without myelopathy or radiculopathy, cervical region: Secondary | ICD-10-CM

## 2019-06-30 DIAGNOSIS — M48062 Spinal stenosis, lumbar region with neurogenic claudication: Secondary | ICD-10-CM

## 2019-06-30 MED ORDER — METHYLPREDNISOLONE ACETATE 40 MG/ML IJ SUSP
40.0000 mg | INTRAMUSCULAR | Status: AC | PRN
  Administered 2019-06-30: 40 mg via INTRA_ARTICULAR

## 2019-06-30 MED ORDER — BUPIVACAINE HCL 0.25 % IJ SOLN
4.0000 mL | INTRAMUSCULAR | Status: AC | PRN
  Administered 2019-06-30: 4 mL via INTRA_ARTICULAR

## 2019-06-30 NOTE — Patient Instructions (Signed)
Avoid bending, stooping and avoid lifting weights greater than 10 lbs. Avoid prolong standing and walking. Avoid frequent bending and stooping  No lifting greater than 10 lbs. May use ice or moist heat for pain. Weight loss is of benefit. Handicap license is approved. Walker for ambulation.  Avoid overhead lifting and overhead use of the arms. Pillows to keep from sleeping directly on the shoulders Limited lifting to less than 10 lbs. Ice or heat for relief. NSAIDs are helpful, such as alleve or motrin, be careful not to use in excess as they place burdens on the kidney. Stretching exercise help and strengthening is helpful to build endurance.  Hemp CBD capsules, amazon.com 5,000-7,000 mg per bottle, 60 capsules per bottle, take one capsule twice a day.  Follow-Up Instructions: No follow-ups on file.

## 2019-06-30 NOTE — Progress Notes (Signed)
Office Visit Note   Patient: [name]           Date of Birth: 06-12-1927           MRN: [id number] Visit Date: 06/30/2019              Requested by: Gayland Curry, DO [place],  [place] [address] PCP: Gayland Curry, DO   Assessment & Plan: Visit Diagnoses:  1. Right shoulder tendonitis   2. Spondylosis without myelopathy or radiculopathy, cervical region   3. Spinal stenosis of lumbar region with neurogenic claudication     Plan: Avoid bending, stooping and avoid lifting weights greater than 10 lbs. Avoid prolong standing and walking. Avoid frequent bending and stooping  No lifting greater than 10 lbs. May use ice or moist heat for pain. Weight loss is of benefit. Handicap license is approved. Walker for ambulation.  Avoid overhead lifting and overhead use of the arms. Pillows to keep from sleeping directly on the shoulders Limited lifting to less than 10 lbs. Ice or heat for relief. NSAIDs are helpful, such as alleve or motrin, be careful not to use in excess as they place burdens on the kidney. Stretching exercise help and strengthening is helpful to build endurance.  Hemp CBD capsules, amazon.com 5,000-7,000 mg per bottle, 60 capsules per bottle, take one capsule twice a day.  Follow-Up Instructions: No follow-ups on file.   Follow-Up Instructions: No follow-ups on file.   Orders:  Orders Placed This Encounter  Procedures  . Large Joint Inj: R glenohumeral   No orders of the defined types were placed in this encounter.     Procedures: Large Joint Inj: R glenohumeral on 06/30/2019 2:45 PM Indications: pain Details: 25 G 1.5 in needle, anterolateral approach  Arthrogram: No  Medications: 40 mg methylPREDNISolone acetate 40 MG/ML; 4 mL bupivacaine 0.25 % Outcome: tolerated well, no immediate complications  Bandaid applied. Procedure, treatment alternatives, risks and benefits explained, specific risks discussed. Consent was given by the  patient. Immediately prior to procedure a time out was called to verify the correct patient, procedure, equipment, support staff and site/side marked as required. Patient was prepped and draped in the usual sterile fashion.       Clinical Data: No additional findings.   Subjective: Chief Complaint  Patient presents with  . Lower Back - Pain    85 year old female right handed with neck pain right shoulder pain and lumbar pain. She has had previous cervical fusion and lumbar fusion the cervical spine fusion 2006 at C3-4 and C4-5 and in 2009 L4-5 lumbar fusion. She has some discomfort with turning her neck and head. She takes diclofenac and hydrocodone from Dr. Amil Amen on [facility]. She has pain in the right shoulder that may be neck related. She has mainly pain in the right lateral upper arm from the top of the right shoulder to the right elbow, uses the diclofenac gel and that helps. She can not sleep on the right side, there is pain when she tries to sleep on the right shoulder and she turns over. There is weakness with lifing the right shoulder up and she feels it into her right arm. No numbness or tingling. Balance is sometimes a problems, no fall recently, last time a couple of months ago. She is more comfortable and not falling and she has trouble getting up when she falls. No bowel difficulty. Previous at night difficulty now no problem with  accidents. Taking colace for her bowels when taking hydrocodone. She is able to walk with a walker but has difficulty with using a cane.    Review of Systems  Constitutional: Negative.  Negative for activity change, appetite change, chills, diaphoresis, fatigue, fever and unexpected weight change.  HENT: Negative.  Negative for congestion, dental problem, drooling, ear discharge, ear pain, facial swelling, hearing loss, mouth sores, nosebleeds, postnasal drip, rhinorrhea, sinus pressure, sinus pain, sneezing, sore throat, tinnitus, trouble  swallowing and voice change.   Eyes: Positive for visual disturbance (glaucoma surgery is pending). Negative for photophobia, pain, discharge, redness and itching.  Respiratory: Negative for apnea, cough, choking, chest tightness, shortness of breath, wheezing and stridor.   Cardiovascular: Negative for chest pain, palpitations and leg swelling.  Gastrointestinal: Negative for abdominal distention, abdominal pain, anal bleeding, blood in stool, constipation, diarrhea, nausea, rectal pain and vomiting.  Endocrine: Negative for cold intolerance, heat intolerance, polydipsia, polyphagia and polyuria.  Genitourinary: Negative for difficulty urinating, dyspareunia, dysuria, enuresis, hematuria and urgency.  Musculoskeletal: Positive for back pain, neck pain and neck stiffness. Negative for arthralgias, gait problem, joint swelling and myalgias.  Skin: Negative for color change, pallor, rash and wound.  Allergic/Immunologic: Negative for environmental allergies, food allergies and immunocompromised state.  Neurological: Negative for dizziness, tremors, seizures, syncope, facial asymmetry, speech difficulty, weakness, light-headedness, numbness and headaches.  Hematological: Negative for adenopathy. Does not bruise/bleed easily.  Psychiatric/Behavioral: Negative for agitation, behavioral problems, confusion, decreased concentration, dysphoric mood, hallucinations, self-injury, sleep disturbance and suicidal ideas. The patient is not nervous/anxious and is not hyperactive.      Objective: Vital Signs: BP 140/87 (BP Location: Left Arm, Patient Position: Sitting)   Pulse 74   Ht 5' (1.524 m)   Wt 134 lb (60.8 kg)   BMI 26.17 kg/m   Physical Exam Constitutional:      Appearance: She is well-developed.  HENT:     Head: Normocephalic and atraumatic.  Eyes:     Pupils: Pupils are equal, round, and reactive to light.  Neck:     Musculoskeletal: Normal range of motion and neck supple.  Pulmonary:      Effort: Pulmonary effort is normal.     Breath sounds: Normal breath sounds.  Abdominal:     General: Bowel sounds are normal.     Palpations: Abdomen is soft.  Skin:    General: Skin is warm and dry.  Neurological:     Mental Status: She is alert and oriented to person, place, and time.  Psychiatric:        Behavior: Behavior normal.        Thought Content: Thought content normal.        Judgment: Judgment normal.     Back Exam   Tenderness  The patient is experiencing tenderness in the cervical and lumbar.  Range of Motion  Extension:  50 abnormal  Lateral bend right:  60 abnormal  Lateral bend left:  60 abnormal  Rotation right:  50 abnormal  Rotation left: 50   Muscle Strength  Right Quadriceps:  5/5  Left Quadriceps:  5/5  Right Hamstrings:  5/5  Left Hamstrings:  5/5   Tests  Straight leg raise right: negative Straight leg raise left: negative  Reflexes  Patellar: 2/4 Achilles: 0/4 Biceps: 0/4 Babinski's sign: normal   Other  Toe walk: normal Heel walk: normal Sensation: normal Gait: normal  Erythema: no back redness Scars: present  Comments:  Lumbar, LE strength is normal SLR negative,  Right Shoulder Exam   Tenderness  The patient is experiencing tenderness in the acromioclavicular joint and acromion.  Range of Motion  Active abduction: abnormal  Passive abduction: abnormal  External rotation: normal  Forward flexion: abnormal  Internal rotation 0 degrees: normal  Internal rotation 90 degrees: normal   Muscle Strength  Abduction: 4/5  Internal rotation: 5/5  External rotation: 4/5  Supraspinatus: 4/5  Subscapularis: 5/5  Biceps: 5/5   Tests  Apprehension: negative Impingement: positive  Other  Erythema: absent Sensation: normal  Comments:  Mild tender right AC joint.      Specialty Comments:  No specialty comments available.  Imaging: No results found.   PMFS History: Patient Active Problem List   Diagnosis  Date Noted  . Recurrent major depressive disorder, in partial remission ([place]) 03/24/2019  . Generalized osteoarthritis of multiple sites 03/24/2019  . Hyperlipidemia associated with type 2 diabetes mellitus ([place]) 10/11/2018  . Need for influenza vaccination 04/01/2018  . Malignant melanoma of left temple ([place]) 11/26/2017  . Chronic venous insufficiency 07/26/2015  . Urge incontinence of urine 07/26/2015  . Glaucoma 07/26/2015  . Postmenopausal estrogen deficiency 07/26/2015  . Primary open angle glaucoma 01/02/2014  . Hyperglycemia 11/18/2012  . Hypertriglyceridemia, essential 11/18/2012  . Essential hypertension, benign 11/18/2012  . Peripheral neuropathy 11/18/2012   Past Medical History:  Diagnosis Date  . Arthritis    Osteoarthritis  . Depression   . GERD (gastroesophageal reflux disease)   . Hyperlipidemia   . Osteopenia     History reviewed. No pertinent family history.  Past Surgical History:  Procedure Laterality Date  . APPENDECTOMY  1936  . CERVICAL FUSION  2004  . COLON SURGERY  06/12/2010   Colonoscopy  . FINGER SURGERY  1989  . MELANOMA EXCISION  11/16/2017   forehead  . SPINE SURGERY  2009   Back Fusion  . TONSILLECTOMY  1941   Social History   Occupational History  . Not on file  Tobacco Use  . Smoking status: Former Research scientist (life sciences)  . Smokeless tobacco: Never Used  Substance and Sexual Activity  . Alcohol use: No    Alcohol/week: 0.0 standard drinks    Comment: rare  . Drug use: No  . Sexual activity: Never

## 2019-07-04 ENCOUNTER — Telehealth: Payer: Self-pay | Admitting: Specialist

## 2019-07-04 ENCOUNTER — Other Ambulatory Visit: Payer: Self-pay | Admitting: Specialist

## 2019-07-04 MED ORDER — METHYLPREDNISOLONE 4 MG PO TABS: ORAL_TABLET | ORAL | 0 refills | Status: DC

## 2019-07-04 NOTE — Telephone Encounter (Signed)
Her neck pain is not likely due to her shoulder but her shoulder may be due to her neck condition. A steroid taper might help diminish the spine stiffness and pain. '

## 2019-07-04 NOTE — Telephone Encounter (Signed)
Patient called stating that since she received the injection in her shoulder, her neck has been stiff and hurting.  She is wanting to know if that has anything to do with the injection.  CB#[phone].  Thank you.

## 2019-07-06 ENCOUNTER — Telehealth: Payer: Self-pay | Admitting: Specialist

## 2019-07-06 NOTE — Telephone Encounter (Signed)
Patient called back in reference to injection.  She did advise that her shoulder pain is better, but her neck is still stiff.  She has been using the Diclofenac on her neck but it does not seem to be helping it, she would like to know what else she can do.  I advised her of the message from Dr. Louanne Skye.  She would like for you to call her.  CB#[phone].  Thank you.

## 2019-07-08 NOTE — Telephone Encounter (Signed)
I had called and spoke with patient, she states that she is using heat and ice on it with muscle rubs, she states that she wants to keep trying this for now. I advised that if it doesn't get better to call us back to make an appt and we would see her for it. She states that she understands

## 2019-08-01 ENCOUNTER — Ambulatory Visit (INDEPENDENT_AMBULATORY_CARE_PROVIDER_SITE_OTHER): Payer: Medicare Other | Admitting: Family

## 2019-08-01 ENCOUNTER — Other Ambulatory Visit: Payer: Self-pay

## 2019-08-01 ENCOUNTER — Encounter: Payer: Self-pay | Admitting: Family

## 2019-08-01 VITALS — BP 118/74 | HR 77 | Temp 96.9°F | Resp 18 | Ht 60.0 in | Wt 135.6 lb

## 2019-08-01 DIAGNOSIS — F411 Generalized anxiety disorder: Secondary | ICD-10-CM | POA: Diagnosis not present

## 2019-08-01 DIAGNOSIS — R32 Unspecified urinary incontinence: Secondary | ICD-10-CM

## 2019-08-01 DIAGNOSIS — F331 Major depressive disorder, recurrent, moderate: Secondary | ICD-10-CM

## 2019-08-01 MED ORDER — SERTRALINE HCL 25 MG PO TABS: 25.0000 mg | ORAL_TABLET | Freq: Every day | ORAL | 0 refills | Status: DC

## 2019-08-01 NOTE — Progress Notes (Signed)
Provider: Christie Viscomi FNP-C  Gayland Curry, DO  Patient Care Team: Gayland Curry, DO as PCP - General (Geriatric Medicine) Warden Fillers, MD as Consulting Physician (Ophthalmology) Griselda Miner, MD as Consulting Physician (Dermatology)  Extended Emergency Contact Information Primary Emergency Contact: Baumann,Erin Address: [address]          [place] [address] [place] Phone: [phone] Relation: Daughter  Code Status:  DNR Goals of care: Advanced Directive information Advanced Directives 03/29/2018  Does Patient Have a Medical Advance Directive? Yes  Type of Advance Directive [place]  Does patient want to make changes to medical advance directive? No - Patient declined  Copy of [place] in Chart? Yes  Would patient like information on creating a medical advance directive? -     Chief Complaint  Patient presents with  . Acute Visit    Patient states she is having anxiety and having depression and would like to discuss medication     HPI:  Pt is a 85 y.o. female seen today for an acute visit for evaluation of depression and anxiety.she is here with her daughter.she states gets easily irritable with family members " I'm just ill around everybody".symptoms has gotten worse especially with the Holidays coming up.she is planning to stay over at her daughter's place but other relatives will also be staying over.she is worried of her irritability with relatives.Also worried about her ongoing urine incontinence and being around family members.she had a referral to Urologist but states will follow up once COVID-19 restrictions are over.she has been staying more indoors and prefers to be alone.This has worsen with COVID-19 restrictions.she denies any changes in her sleep or appetite.No suicide thoughts.also denies any symptoms or signs of urinary tract infections.she request to start on Xanax.Discussed with  patient and daughter high risk of sedation and falls with benzodiazepines due to her advance age.patient and daughter verbalized understanding since patient's gait is already very unsteady.Options for SSRI offered.     Past Medical History:  Diagnosis Date  . Arthritis    Osteoarthritis  . Depression   . GERD (gastroesophageal reflux disease)   . Hyperlipidemia   . Osteopenia    Past Surgical History:  Procedure Laterality Date  . APPENDECTOMY  1936  . CERVICAL FUSION  2004  . COLON SURGERY  06/12/2010   Colonoscopy  . FINGER SURGERY  1989  . MELANOMA EXCISION  11/16/2017   forehead  . SPINE SURGERY  2009   Back Fusion  . TONSILLECTOMY  1941    Allergies  Allergen Reactions  . Penicillins Rash    Outpatient Encounter Medications as of 08/01/2019  Medication Sig  . brimonidine (ALPHAGAN) 0.15 % ophthalmic solution INT 1 GTT INTO OU BID  . cholecalciferol (VITAMIN D) 1000 UNITS tablet Take 1,000 Units by mouth 2 (two) times daily.   . diclofenac sodium (VOLTAREN) 1 % GEL Apply 2 g topically 4 (four) times daily.  . dorzolamide-timolol (COSOPT) 22.3-6.8 MG/ML ophthalmic solution Place 1 drop into both eyes daily.  Marland Kitchen gabapentin (NEURONTIN) 300 MG capsule Take 600 mg by mouth at bedtime.   Marland Kitchen latanoprost (XALATAN) 0.005 % ophthalmic solution Place 1 drop into both eyes at bedtime.   Marland Kitchen lisinopril (PRINIVIL,ZESTRIL) 20 MG tablet Take 1 tablet (20 mg total) by mouth daily.  . pantoprazole (PROTONIX) 40 MG tablet TAKE 1 TABLET BY MOUTH EVERY DAY FOR ACID REFLUX.  . simvastatin (ZOCOR) 80 MG tablet Take 1 tablet (80  mg total) by mouth daily.  . [DISCONTINUED] methylPREDNISolone (MEDROL) 4 MG tablet Take 1 tablet (4 mg total) by mouth daily for 14 days, THEN 0.5 tablets (2 mg total) daily for 14 days. Discontinue oral diclofenac while taking this medication.   No facility-administered encounter medications on file as of 08/01/2019.    Review of Systems  Constitutional: Negative  for appetite change, chills, fatigue and fever.  Respiratory: Negative for cough, chest tightness, shortness of breath and wheezing.   Cardiovascular: Negative for chest pain, palpitations and leg swelling.  Gastrointestinal: Negative for abdominal distention, abdominal pain, constipation, diarrhea, nausea and vomiting.  Genitourinary: Negative for difficulty urinating, dysuria, flank pain, frequency, hematuria and urgency.       Ongoing issues with urine incontinence.   Musculoskeletal: Positive for gait problem.  Skin: Negative for color change, pallor and rash.  Neurological: Negative for dizziness, syncope, speech difficulty, weakness, light-headedness, numbness and headaches.  Psychiatric/Behavioral: Positive for agitation. Negative for behavioral problems, confusion, sleep disturbance and suicidal ideas. The patient is nervous/anxious.     Immunization History  Administered Date(s) Administered  . DTaP 08/19/2007  . Fluad Quad(high Dose 65+) 05/24/2019  . Influenza, High Dose Seasonal PF 05/18/2017  . Influenza,inj,Quad PF,6+ Mos 05/31/2013, 05/23/2014, 05/04/2015, 05/04/2015, 05/01/2016, 04/01/2018  . Influenza-Unspecified 05/18/2010, 06/10/2011, 06/09/2012  . Pneumococcal Conjugate-13 01/22/2015  . Pneumococcal Polysaccharide-23 08/19/2007  . Tdap 03/03/2014  . Zoster 03/18/2013   Pertinent  Health Maintenance Due  Topic Date Due  . HEMOGLOBIN A1C  04/11/2019  . FOOT EXAM  08/17/2020 (Originally 02/12/1937)  . OPHTHALMOLOGY EXAM  05/18/2020  . INFLUENZA VACCINE  Completed  . DEXA SCAN  Completed  . PNA vac Low Risk Adult  Completed   Fall Risk  08/01/2019 03/24/2019 10/11/2018 04/01/2018 03/29/2018  Falls in the past year? 0 1 0 No Yes  Number falls in past yr: 0 0 0 - 2 or more  Injury with Fall? 0 0 0 - No    Vitals:   08/01/19 1535  BP: 118/74  Pulse: 77  Resp: 18  Temp: (!) 96.9 F (36.1 C)  TempSrc: Temporal  SpO2: 95%  Weight: 135 lb 9.6 oz (61.5 kg)  Height:  5' (1.524 m)   Body mass index is 26.48 kg/m. Physical Exam Vitals reviewed.  Constitutional:      General: She is not in acute distress.    Appearance: She is overweight. She is not ill-appearing.  HENT:     Head: Normocephalic.  Eyes:     General: No scleral icterus.       Right eye: No discharge.        Left eye: No discharge.     Conjunctiva/sclera: Conjunctivae normal.     Pupils: Pupils are equal, round, and reactive to light.  Neck:     Vascular: No carotid bruit.  Cardiovascular:     Rate and Rhythm: Normal rate and regular rhythm.     Pulses: Normal pulses.     Heart sounds: Normal heart sounds. No murmur. No friction rub. No gallop.   Pulmonary:     Effort: Pulmonary effort is normal. No respiratory distress.     Breath sounds: Normal breath sounds. No wheezing, rhonchi or rales.  Chest:     Chest wall: No tenderness.  Abdominal:     General: Bowel sounds are normal. There is no distension.     Palpations: Abdomen is soft. There is no mass.     Tenderness: There is no abdominal tenderness.  There is no right CVA tenderness, left CVA tenderness, guarding or rebound.  Musculoskeletal:        General: No swelling.     Cervical back: Normal range of motion. No rigidity or tenderness.     Right lower leg: No edema.     Left lower leg: No edema.     Comments: Unsteady gait uses walker and cane but holding onto daughter when she came in for visit. Limeted ROM to right shoulder due to chronic arthritic pain   Lymphadenopathy:     Cervical: No cervical adenopathy.  Skin:    General: Skin is warm and dry.     Coloration: Skin is not pale.     Findings: No erythema or rash.  Neurological:     Mental Status: She is alert and oriented to person, place, and time.     Cranial Nerves: No cranial nerve deficit.     Motor: No weakness.     Coordination: Coordination normal.     Gait: Gait abnormal.  Psychiatric:        Mood and Affect: Mood is anxious and depressed. Mood is  not elated. Affect is not labile, blunt, flat, angry, tearful or inappropriate.        Speech: Speech normal.        Behavior: Behavior normal.        Thought Content: Thought content normal.        Judgment: Judgment normal.    Labs reviewed: Recent Labs    10/11/18 1200  NA 141  K 4.0  CL 104  CO2 28  GLUCOSE 89  BUN 13  CREATININE 0.61  CALCIUM 10.0   Recent Labs    10/11/18 1200  AST 15  ALT 13  BILITOT 0.4  PROT 6.4   Recent Labs    10/11/18 1200  WBC 8.8  NEUTROABS 5,931  HGB 13.3  HCT 40.3  MCV 84.8  PLT 267   Lab Results  Component Value Date   TSH 0.32 (L) 07/25/2016   Lab Results  Component Value Date   HGBA1C 5.8 (H) 10/11/2018   Lab Results  Component Value Date   CHOL 279 (H) 10/11/2018   HDL 66 10/11/2018   LDLCALC 167 (H) 10/11/2018   TRIG 283 (H) 10/11/2018   CHOLHDL 4.2 10/11/2018    Significant Diagnostic Results in last 30 days:  No results found.  Assessment/Plan 1. Generalized anxiety disorder Worsening symptoms with upcoming holidays.Request Xanax but I've discussed with her high risk for sedation and also high risk for falls due to her advance age,unsteady gait  and hx of falls.Prefer SSRI rather than Xanax,Both daughter and patient are in agreement with trying an SSRI.I've advised her that SSRI takes time to be effective.Will initiate sertraline 25 mg tablet daily then follow up in 4 weeks.will advance dose if needed.   - sertraline (ZOLOFT) 25 MG tablet; Take 1 tablet (25 mg total) by mouth at bedtime.  Dispense: 30 tablet; Refill: 0  2. Moderate episode of recurrent major depressive disorder ([place]) Has had new onset of increased irritability towards family and prefers to be alone though seems to have worsen with COVID-19 restrictions.Start on sertraline as above.  3. Urinary incontinence, unspecified type Afebrile.ongoing.Previously referred by PCP to urologist but cancelled appointment due to COVID-19.still plans to follow  up once COVID-19 restrictions are over.encouraged to empty bladder at least every 2 hrs and cut down fluid intake at 6 pm to prevent getting up at night. Wears  incontinent pads.good peri-care advised to prevent UTI's.   Family/ staff Communication: Reviewed plan of care with patient and daughter.   Labs/tests ordered: None  Next appointment:  4 weeks for follow up Anxiety and depression.    Sandrea Hughs, NP

## 2019-08-15 DIAGNOSIS — F331 Major depressive disorder, recurrent, moderate: Secondary | ICD-10-CM | POA: Insufficient documentation

## 2019-08-15 DIAGNOSIS — F411 Generalized anxiety disorder: Secondary | ICD-10-CM | POA: Insufficient documentation

## 2019-08-25 ENCOUNTER — Other Ambulatory Visit: Payer: Self-pay | Admitting: Family

## 2019-08-25 DIAGNOSIS — F411 Generalized anxiety disorder: Secondary | ICD-10-CM

## 2019-08-25 NOTE — Telephone Encounter (Signed)
Patient has requested refill to soon and  end date for prescription is 08/31/2019

## 2019-08-25 NOTE — Telephone Encounter (Signed)
Patient has upcoming appointment 08/29/2019.Okay to refill sertraline.

## 2019-08-26 ENCOUNTER — Other Ambulatory Visit: Payer: Self-pay

## 2019-08-26 DIAGNOSIS — Z961 Presence of intraocular lens: Secondary | ICD-10-CM | POA: Diagnosis not present

## 2019-08-26 DIAGNOSIS — H401122 Primary open-angle glaucoma, left eye, moderate stage: Secondary | ICD-10-CM | POA: Diagnosis not present

## 2019-08-26 DIAGNOSIS — H43813 Vitreous degeneration, bilateral: Secondary | ICD-10-CM | POA: Diagnosis not present

## 2019-08-26 DIAGNOSIS — H401113 Primary open-angle glaucoma, right eye, severe stage: Secondary | ICD-10-CM | POA: Diagnosis not present

## 2019-08-26 MED ORDER — SERTRALINE HCL 25 MG PO TABS: 25.0000 mg | ORAL_TABLET | Freq: Every day | ORAL | 0 refills | Status: DC

## 2019-08-26 NOTE — Telephone Encounter (Signed)
Refill is done

## 2019-08-26 NOTE — Addendum Note (Signed)
Addended by: Tanna Savoy on: 08/26/2019 09:48 AM   Modules accepted: Orders

## 2019-08-29 ENCOUNTER — Ambulatory Visit: Payer: Medicare Other | Admitting: Family

## 2019-09-21 ENCOUNTER — Other Ambulatory Visit: Payer: Self-pay

## 2019-09-21 ENCOUNTER — Other Ambulatory Visit: Payer: Medicare Other

## 2019-09-21 DIAGNOSIS — I10 Essential (primary) hypertension: Secondary | ICD-10-CM | POA: Diagnosis not present

## 2019-09-21 DIAGNOSIS — E781 Pure hyperglyceridemia: Secondary | ICD-10-CM | POA: Diagnosis not present

## 2019-09-21 DIAGNOSIS — R739 Hyperglycemia, unspecified: Secondary | ICD-10-CM | POA: Diagnosis not present

## 2019-09-22 LAB — CBC WITH DIFFERENTIAL/PLATELET
Absolute Monocytes: 416 cells/uL (ref 200–950)
Basophils Absolute: 40 cells/uL (ref 0–200)
Basophils Relative: 0.7 %
Eosinophils Absolute: 160 cells/uL (ref 15–500)
Eosinophils Relative: 2.8 %
HCT: 44.4 % (ref 35.0–45.0)
Hemoglobin: 14.6 g/dL (ref 11.7–15.5)
Lymphs Abs: 1733 cells/uL (ref 850–3900)
MCH: 28.8 pg (ref 27.0–33.0)
MCHC: 32.9 g/dL (ref 32.0–36.0)
MCV: 87.6 fL (ref 80.0–100.0)
MPV: 10 fL (ref 7.5–12.5)
Monocytes Relative: 7.3 %
Neutro Abs: 3352 cells/uL (ref 1500–7800)
Neutrophils Relative %: 58.8 %
Platelets: 257 10*3/uL (ref 140–400)
RBC: 5.07 10*6/uL (ref 3.80–5.10)
RDW: 13.9 % (ref 11.0–15.0)
Total Lymphocyte: 30.4 %
WBC: 5.7 10*3/uL (ref 3.8–10.8)

## 2019-09-22 LAB — LIPID PANEL
Cholesterol: 295 mg/dL — ABNORMAL HIGH (ref <200)
HDL: 59 mg/dL (ref >=50)
LDL Cholesterol (Calc): 193 mg/dL (calc) — ABNORMAL HIGH
Non-HDL Cholesterol (Calc): 236 mg/dL (calc) — ABNORMAL HIGH (ref <130)
Total CHOL/HDL Ratio: 5 (calc) — ABNORMAL HIGH (ref <5.0)
Triglycerides: 231 mg/dL — ABNORMAL HIGH (ref <150)

## 2019-09-22 LAB — COMPLETE METABOLIC PANEL WITH GFR
AG Ratio: 1.6 (calc) (ref 1.0–2.5)
ALT: 13 U/L (ref 6–29)
AST: 16 U/L (ref 10–35)
Albumin: 4.1 g/dL (ref 3.6–5.1)
Alkaline phosphatase (APISO): 70 U/L (ref 37–153)
BUN: 16 mg/dL (ref 7–25)
CO2: 26 mmol/L (ref 20–32)
Calcium: 9.7 mg/dL (ref 8.6–10.4)
Chloride: 107 mmol/L (ref 98–110)
Creat: 0.65 mg/dL (ref 0.60–0.88)
GFR, Est African American: 91 mL/min/{1.73_m2} (ref >=60)
GFR, Est Non African American: 78 mL/min/{1.73_m2} (ref >=60)
Globulin: 2.5 g/dL (calc) (ref 1.9–3.7)
Glucose, Bld: 99 mg/dL (ref 65–99)
Potassium: 4.4 mmol/L (ref 3.5–5.3)
Sodium: 143 mmol/L (ref 135–146)
Total Bilirubin: 0.4 mg/dL (ref 0.2–1.2)
Total Protein: 6.6 g/dL (ref 6.1–8.1)

## 2019-09-22 LAB — HEMOGLOBIN A1C
Hgb A1c MFr Bld: 5.8 % of total Hgb — ABNORMAL HIGH (ref <5.7)
Mean Plasma Glucose: 120 (calc)
eAG (mmol/L): 6.6 (calc)

## 2019-09-22 NOTE — Progress Notes (Signed)
Bad cholesterol and triglycerides (starchy fats) are quite high.   Sugar average is stable in prediabetic range at 5.8. Kidneys, liver and electrolytes are normal. Blood counts are normal.

## 2019-09-23 ENCOUNTER — Other Ambulatory Visit: Payer: Self-pay | Admitting: Internal Medicine

## 2019-09-23 DIAGNOSIS — F411 Generalized anxiety disorder: Secondary | ICD-10-CM

## 2019-09-26 ENCOUNTER — Encounter: Payer: Self-pay | Admitting: Internal Medicine

## 2019-09-26 ENCOUNTER — Other Ambulatory Visit: Payer: Self-pay

## 2019-09-26 ENCOUNTER — Ambulatory Visit (INDEPENDENT_AMBULATORY_CARE_PROVIDER_SITE_OTHER): Payer: Medicare Other | Admitting: Internal Medicine

## 2019-09-26 VITALS — BP 122/82 | HR 86 | Temp 97.5°F | Ht 61.0 in | Wt 136.0 lb

## 2019-09-26 DIAGNOSIS — R739 Hyperglycemia, unspecified: Secondary | ICD-10-CM

## 2019-09-26 DIAGNOSIS — I10 Essential (primary) hypertension: Secondary | ICD-10-CM

## 2019-09-26 DIAGNOSIS — N3941 Urge incontinence: Secondary | ICD-10-CM | POA: Diagnosis not present

## 2019-09-26 DIAGNOSIS — M159 Polyosteoarthritis, unspecified: Secondary | ICD-10-CM

## 2019-09-26 DIAGNOSIS — E781 Pure hyperglyceridemia: Secondary | ICD-10-CM

## 2019-09-26 MED ORDER — MIRABEGRON ER 25 MG PO TB24: 25.0000 mg | ORAL_TABLET | Freq: Every day | ORAL | 0 refills | Status: DC

## 2019-09-26 NOTE — Progress Notes (Signed)
Location:  [facility] clinic  Provider: Dr. Hollace Kinnier  Goals of Care:  Advanced Directives 09/26/2019  Does Patient Have a Medical Advance Directive? Yes  Type of Paramedic of [place];Living will;Out of facility DNR (pink MOST or yellow form)  Does patient want to make changes to medical advance directive? No - Patient declined  Copy of [place] in Chart? Yes - validated most recent copy scanned in chart (See row information)  Would patient like information on creating a medical advance directive? -     Chief Complaint  Patient presents with  . Medical Management of Chronic Issues    6 month follow up / urologist     HPI: Patient is a 86 y.o. female seen today for medical management of chronic diseases.    Labs reviewed with patient.   Her LDL is elevated. She claims she stopped taking her simvastatin about 4 months ago. She does not follow any particular diet.   Eats about 2 meals a day. Drinks a couple glasses of water daily.   She is asking for lisinopril with HCTZ. Claims her left ankle will swell a few times a week.   Just seen by eye doctor. Pressure in her eyes was stable. Takes her drops daily.   Ambulates with rollator. Denies any falls or injuries.   She has not received her covid vaccine. She plans on signing up for vaccine soon.   She is still having urinary leakage at night. It is not consistant. Some nights her pad is soaked, sometimes dry. Has tried Myrbetriq in the past without success. Would like to try again with samples if possible.          Past Medical History:  Diagnosis Date  . Arthritis    Osteoarthritis  . Depression   . GERD (gastroesophageal reflux disease)   . Hyperlipidemia   . Osteopenia     Past Surgical History:  Procedure Laterality Date  . APPENDECTOMY  1936  . CERVICAL FUSION  2004  . COLON SURGERY  06/12/2010   Colonoscopy  . FINGER SURGERY  1989  . MELANOMA EXCISION   11/16/2017   forehead  . SPINE SURGERY  2009   Back Fusion  . TONSILLECTOMY  1941    Allergies  Allergen Reactions  . Penicillins Rash    Outpatient Encounter Medications as of 09/26/2019  Medication Sig  . brimonidine (ALPHAGAN) 0.15 % ophthalmic solution INT 1 GTT INTO OU BID  . cholecalciferol (VITAMIN D) 1000 UNITS tablet Take 1,000 Units by mouth 2 (two) times daily.   . diclofenac sodium (VOLTAREN) 1 % GEL Apply 2 g topically 4 (four) times daily.  . dorzolamide-timolol (COSOPT) 22.3-6.8 MG/ML ophthalmic solution Place 1 drop into both eyes daily.  Marland Kitchen gabapentin (NEURONTIN) 300 MG capsule Take 600 mg by mouth at bedtime.   Marland Kitchen latanoprost (XALATAN) 0.005 % ophthalmic solution Place 1 drop into both eyes at bedtime.   Marland Kitchen lisinopril (PRINIVIL,ZESTRIL) 20 MG tablet Take 1 tablet (20 mg total) by mouth daily.  . pantoprazole (PROTONIX) 40 MG tablet TAKE 1 TABLET BY MOUTH EVERY DAY FOR ACID REFLUX.  Marland Kitchen sertraline (ZOLOFT) 25 MG tablet TAKE 1 TABLET BY MOUTH EVERYDAY AT BEDTIME  . simvastatin (ZOCOR) 80 MG tablet Take 1 tablet (80 mg total) by mouth daily.   No facility-administered encounter medications on file as of 09/26/2019.    Review of Systems:  Review of Systems  Constitutional: Negative for activity change, appetite change  and fatigue.  HENT: Negative for dental problem, hearing loss and trouble swallowing.   Eyes: Negative for photophobia and visual disturbance.       Glasses for distance  Respiratory: Negative for cough, shortness of breath and wheezing.   Cardiovascular: Positive for leg swelling. Negative for chest pain.  Gastrointestinal: Positive for constipation. Negative for abdominal pain, diarrhea and nausea.  Endocrine: Negative for polydipsia, polyphagia and polyuria.  Genitourinary: Negative for dysuria and hematuria.       Urinary leakage at night  Musculoskeletal: Positive for back pain and neck pain.  Skin:       Past history of melanoma  Neurological:  Negative for dizziness, weakness, numbness and headaches.  Psychiatric/Behavioral: Negative for dysphoric mood. The patient is not nervous/anxious.     Health Maintenance  Topic Date Due  . FOOT EXAM  08/17/2020 (Originally 02/12/1937)  . HEMOGLOBIN A1C  03/20/2020  . OPHTHALMOLOGY EXAM  05/18/2020  . TETANUS/TDAP  03/03/2024  . INFLUENZA VACCINE  Completed  . DEXA SCAN  Completed  . PNA vac Low Risk Adult  Completed    Physical Exam: Vitals:   09/26/19 1043  BP: 122/82  Pulse: 86  Temp: (!) 97.5 F (36.4 C)  SpO2: 97%  Weight: 136 lb (61.7 kg)  Height: 5\' 1"  (1.549 m)   Body mass index is 25.7 kg/m. Physical Exam Vitals reviewed.  Constitutional:      Appearance: Normal appearance.  HENT:     Head: Normocephalic.  Cardiovascular:     Rate and Rhythm: Normal rate and regular rhythm.     Pulses: Normal pulses.     Heart sounds: Normal heart sounds. No murmur.  Pulmonary:     Effort: Pulmonary effort is normal.     Breath sounds: Normal breath sounds.  Abdominal:     General: Abdomen is flat. Bowel sounds are normal.     Palpations: Abdomen is soft.  Musculoskeletal:        General: Normal range of motion.     Right lower leg: No edema.     Left lower leg: No edema.  Skin:    General: Skin is warm and dry.     Capillary Refill: Capillary refill takes less than 2 seconds.  Neurological:     General: No focal deficit present.     Mental Status: She is alert and oriented to person, place, and time. Mental status is at baseline.  Psychiatric:        Mood and Affect: Mood normal.        Behavior: Behavior normal.        Thought Content: Thought content normal.        Judgment: Judgment normal.     Labs reviewed: Basic Metabolic Panel: Recent Labs    10/11/18 1200 09/21/19 1003  NA 141 143  K 4.0 4.4  CL 104 107  CO2 28 26  GLUCOSE 89 99  BUN 13 16  CREATININE 0.61 0.65  CALCIUM 10.0 9.7   Liver Function Tests: Recent Labs    10/11/18 1200  09/21/19 1003  AST 15 16  ALT 13 13  BILITOT 0.4 0.4  PROT 6.4 6.6   No results for input(s): LIPASE, AMYLASE in the last 8760 hours. No results for input(s): AMMONIA in the last 8760 hours. CBC: Recent Labs    10/11/18 1200 09/21/19 1003  WBC 8.8 5.7  NEUTROABS 5,931 3,352  HGB 13.3 14.6  HCT 40.3 44.4  MCV 84.8 87.6  PLT 267  257   Lipid Panel: Recent Labs    10/11/18 1200 09/21/19 1003  CHOL 279* 295*  HDL 66 59  LDLCALC 167* 193*  TRIG 283* 231*  CHOLHDL 4.2 5.0*   Lab Results  Component Value Date   HGBA1C 5.8 (H) 09/21/2019    Procedures since last visit: No results found.  Assessment/Plan 1. Essential hypertension, benign - bp at goal <150/90 - continue lisinopril as prescribed - continue to follow low sodium diet  2. Generalized osteoarthritis of multiple sites - followed by rheumatology for OA - stable at this time - continues to use hydrocodone PRN for pain - ambulates with rollator  3. Hyperglycemia - has not progressed since last visit - limit foods high in carbs and sugars - hemoglobin A1C- future - complete metabolic panel with GFR- future  4. Urge incontinence of urine - incidents of incontinence occurring more frequently - has tried myrbetriq in the past - will give samples of myrbetriq for incontinence  5. Hypertriglyceridemia, essential - suspect stopping statin contributing to elevated levels - lipid panel- future - complete metabolic panel with GFR- future      Labs/tests ordered:  Cbc with differenial/platelets, complete metabolic panel with GFR, lipid panel, hemoglobin A1C- future Next appt:  4 month follow up

## 2019-09-27 ENCOUNTER — Ambulatory Visit (INDEPENDENT_AMBULATORY_CARE_PROVIDER_SITE_OTHER): Payer: Medicare Other | Admitting: Family

## 2019-09-27 ENCOUNTER — Encounter: Payer: Self-pay | Admitting: Family

## 2019-09-27 DIAGNOSIS — Z0000 Encounter for general adult medical examination without abnormal findings: Secondary | ICD-10-CM

## 2019-09-27 NOTE — Progress Notes (Signed)
Subjective:   [name] is a 86 y.o. female who presents for Medicare Annual (Subsequent) preventive examination.  Review of Systems:  Cardiac Risk Factors include: dyslipidemia;hypertension;advanced age (>16men, >75 women);sedentary lifestyle;smoking/ tobacco exposure     Objective:     Vitals: There were no vitals taken for this visit.  There is no height or weight on file to calculate BMI.  Advanced Directives 09/26/2019 03/29/2018 01/29/2017 01/22/2017 01/13/2017 12/05/2016 07/25/2016  Does Patient Have a Medical Advance Directive? Yes Yes Yes Yes Yes No Yes  Type of Paramedic of [place];Living will;Out of facility DNR (pink MOST or yellow form) Healthcare Power of [place];Living will Healthcare Power of [place]  Does patient want to make changes to medical advance directive? No - Patient declined No - Patient declined - - - - -  Copy of [place] in Chart? Yes - validated most recent copy scanned in chart (See row information) Yes - Yes Yes - Yes  Would patient like information on creating a medical advance directive? - - - - - No - Patient declined -    Tobacco Social History   Tobacco Use  Smoking Status Former Smoker  Smokeless Tobacco Never Used     Counseling given: Not Answered   Clinical Intake:  Pre-visit preparation completed: No  Pain : 0-10 Pain Score: 4  Pain Type: Chronic pain Pain Location: Back(shoulder s) Pain Orientation: Lower Pain Radiating Towards: No Pain Descriptors / Indicators: Aching Pain Onset: (several years) Pain Frequency: Intermittent Pain Relieving Factors: Hydrocodone,ice compressor Effect of Pain on Daily Activities: yes  Pain Relieving Factors: Hydrocodone,ice compressor  BMI - recorded: 25.7(previous weight) Nutritional Status: BMI 25 -29 Overweight Nutritional Risks: None Diabetes: No  How  often do you need to have someone help you when you read instructions, pamphlets, or other written materials from your doctor or pharmacy?: 1 - Never What is the last grade level you completed in school?: 12 Grade  Interpreter Needed?: No  Information entered by :: Erlin Gardella FNP-C  Past Medical History:  Diagnosis Date  . Arthritis    Osteoarthritis  . Depression   . GERD (gastroesophageal reflux disease)   . Hyperlipidemia   . Osteopenia    Past Surgical History:  Procedure Laterality Date  . APPENDECTOMY  1936  . CERVICAL FUSION  2004  . COLON SURGERY  06/12/2010   Colonoscopy  . FINGER SURGERY  1989  . MELANOMA EXCISION  11/16/2017   forehead  . SPINE SURGERY  2009   Back Fusion  . TONSILLECTOMY  1941   No family history on file. Social History   Socioeconomic History  . Marital status: Divorced    Spouse name: Not on file  . Number of children: Not on file  . Years of education: Not on file  . Highest education level: Not on file  Occupational History  . Not on file  Tobacco Use  . Smoking status: Former Research scientist (life sciences)  . Smokeless tobacco: Never Used  Substance and Sexual Activity  . Alcohol use: No    Alcohol/week: 0.0 standard drinks    Comment: rare  . Drug use: No  . Sexual activity: Never  Other Topics Concern  . Not on file  Social History Narrative  . Not on file   Social Determinants of Health   Financial Resource Strain:   . Difficulty of Paying Living Expenses: Not on  file  Food Insecurity:   . Worried About Charity fundraiser in the Last Year: Not on file  . Ran Out of Food in the Last Year: Not on file  Transportation Needs:   . Lack of Transportation (Medical): Not on file  . Lack of Transportation (Non-Medical): Not on file  Physical Activity:   . Days of Exercise per Week: Not on file  . Minutes of Exercise per Session: Not on file  Stress:   . Feeling of Stress : Not on file  Social Connections:   . Frequency of Communication  with Friends and Family: Not on file  . Frequency of Social Gatherings with Friends and Family: Not on file  . Attends Religious Services: Not on file  . Active Member of Clubs or Organizations: Not on file  . Attends Archivist Meetings: Not on file  . Marital Status: Not on file    Outpatient Encounter Medications as of 09/27/2019  Medication Sig  . brimonidine (ALPHAGAN) 0.15 % ophthalmic solution INT 1 GTT INTO OU BID  . cholecalciferol (VITAMIN D) 1000 UNITS tablet Take 1,000 Units by mouth 2 (two) times daily.   . diclofenac sodium (VOLTAREN) 1 % GEL Apply 2 g topically 4 (four) times daily.  . dorzolamide-timolol (COSOPT) 22.3-6.8 MG/ML ophthalmic solution Place 1 drop into both eyes daily.  Marland Kitchen gabapentin (NEURONTIN) 300 MG capsule Take 600 mg by mouth at bedtime.   Marland Kitchen HYDROcodone-acetaminophen (NORCO/VICODIN) 5-325 MG tablet Take 1 tablet by mouth 2 (two) times daily as needed for severe pain.  Marland Kitchen latanoprost (XALATAN) 0.005 % ophthalmic solution Place 1 drop into both eyes at bedtime.   Marland Kitchen lisinopril (PRINIVIL,ZESTRIL) 20 MG tablet Take 1 tablet (20 mg total) by mouth daily.  . mirabegron ER (MYRBETRIQ) 25 MG TB24 tablet Take 1 tablet (25 mg total) by mouth daily.  . pantoprazole (PROTONIX) 40 MG tablet TAKE 1 TABLET BY MOUTH EVERY DAY FOR ACID REFLUX.  Marland Kitchen sertraline (ZOLOFT) 25 MG tablet TAKE 1 TABLET BY MOUTH EVERYDAY AT BEDTIME  . simvastatin (ZOCOR) 80 MG tablet Take 1 tablet (80 mg total) by mouth daily.   No facility-administered encounter medications on file as of 09/27/2019.    Activities of Daily Living In your present state of health, do you have any difficulty performing the following activities: 09/27/2019  Hearing? N  Vision? N  Difficulty concentrating or making decisions? N  Walking or climbing stairs? Y  Comment uses a walker  Dressing or bathing? N  Doing errands, shopping? Y  Comment Daughter assist with shopping  Preparing Food and eating ? N  Using the  Toilet? N  In the past six months, have you accidently leaked urine? N  Do you have problems with loss of bowel control? N  Managing your Medications? N  Managing your Finances? N  Housekeeping or managing your Housekeeping? N  Some recent data might be hidden    Patient Care Team: Gayland Curry, DO as PCP - General (Geriatric Medicine) Warden Fillers, MD as Consulting Physician (Ophthalmology) Griselda Miner, MD as Consulting Physician (Dermatology)    Assessment:   This is a routine wellness examination for [facility]  Exercise Activities and Dietary recommendations Current Exercise Habits: The patient does not participate in regular exercise at present, Exercise limited by: None identified  Goals   None     Fall Risk Fall Risk  09/27/2019 09/26/2019 08/01/2019 03/24/2019 10/11/2018  Falls in the past year? 0 0 0 1 0  Number falls in past yr: 0 0 0 0 0  Injury with Fall? 0 0 0 0 0   Is the patient's home free of loose throw rugs in walkways, pet beds, electrical cords, etc?   no      Grab bars in the bathroom? yes      Handrails on the stairs?   no      Adequate lighting?   yes  Depression Screen PHQ 2/9 Scores 09/26/2019 03/24/2019 10/11/2018 04/01/2018  PHQ - 2 Score 0 0 0 0     Cognitive Function MMSE - Mini Mental State Exam 03/29/2018 01/24/2016  Orientation to time 3 5  Orientation to Place 5 5  Registration 3 3  Attention/ Calculation 5 5  Recall 3 3  Language- name 2 objects 2 2  Language- repeat 1 1  Language- follow 3 step command 3 3  Language- read & follow direction 1 1  Write a sentence 1 1  Copy design 0 1  Total score 27 30     6CIT Screen 09/27/2019  What Year? 4 points  What month? 0 points  What time? 0 points  Count back from 20 2 points  Months in reverse 0 points  Repeat phrase 0 points  Total Score 6    Immunization History  Administered Date(s) Administered  . DTaP 08/19/2007  . Fluad Quad(high Dose 65+) 05/24/2019  . Influenza, High  Dose Seasonal PF 05/18/2017  . Influenza,inj,Quad PF,6+ Mos 05/31/2013, 05/23/2014, 05/04/2015, 05/04/2015, 05/01/2016, 04/01/2018  . Influenza-Unspecified 05/18/2010, 06/10/2011, 06/09/2012  . Pneumococcal Conjugate-13 01/22/2015  . Pneumococcal Polysaccharide-23 08/19/2007  . Tdap 03/03/2014  . Zoster 03/18/2013    Qualifies for Shingles Vaccine? Declined.  Screening Tests Health Maintenance  Topic Date Due  . FOOT EXAM  08/17/2020 (Originally 02/12/1937)  . HEMOGLOBIN A1C  03/20/2020  . OPHTHALMOLOGY EXAM  05/18/2020  . TETANUS/TDAP  03/03/2024  . INFLUENZA VACCINE  Completed  . DEXA SCAN  Completed  . PNA vac Low Risk Adult  Completed    Cancer Screenings: Lung: Low Dose CT Chest recommended if Age 48-80 years, 30 pack-year currently smoking OR have quit w/in 15years. Patient does not qualify. Breast:  Up to date on Mammogram? Yes   Up to date of Bone Density/Dexa? Yes Colorectal: Age out   Additional Screenings: Hepatitis C Screening: Low risk     Plan:    I have personally reviewed and noted the following in the patient's chart:   . Medical and social history . Use of alcohol, tobacco or illicit drugs  . Current medications and supplements . Functional ability and status . Nutritional status . Physical activity . Advanced directives . List of other physicians . Hospitalizations, surgeries, and ER visits in previous 12 months . Vitals . Screenings to include cognitive, depression, and falls . Referrals and appointments  In addition, I have reviewed and discussed with patient certain preventive protocols, quality metrics, and best practice recommendations. A written personalized care plan for preventive services as well as general preventive health recommendations were provided to patient.    Sandrea Hughs, NP  09/27/2019

## 2019-09-27 NOTE — Progress Notes (Signed)
Patient ID: [name], female   DOB: 08/26/1926, 86 y.o.   MRN: [id number] This service is provided via telemedicine  No vital signs collected/recorded due to the encounter was a telemedicine visit.   Location of patient (ex: home, work):  HOME  Patient consents to a telephone visit:  YES  Location of the provider (ex: office, home):  OFFICE   Name of any referring provider:  TIFFANY REED, DO  Names of all persons participating in the telemedicine service and their role in the encounter:  PATIENT, Edwin Dada, [place], [place], NP  Time spent on call:  7:46

## 2019-09-27 NOTE — Patient Instructions (Signed)
[name] , Thank you for taking time to come for your Medicare Wellness Visit. I appreciate your ongoing commitment to your health goals. Please review the following plan we discussed and let me know if I can assist you in the future.   Screening recommendations/referrals: Colonoscopy: Aged out  Mammogram: Aged out  Bone Density: Up to date  Recommended yearly ophthalmology/optometry visit for glaucoma screening and checkup Recommended yearly dental visit for hygiene and checkup  Vaccinations: Influenza vaccine: Up to date  Pneumococcal vaccine : Up to date  Tdap vaccine : Up to date  Shingles vaccine: Due for Shingrix   Advanced directives: Yes   Conditions/risks identified: Advance age female > 53 yrs,Dyslipidemia,Hypertension,sedentary lifestyle,Hx of Smoking   Next appointment: 1 year    Preventive Care 65 Years and Older, Female Preventive care refers to lifestyle choices and visits with your health care provider that can promote health and wellness. What does preventive care include?  A yearly physical exam. This is also called an annual well check.  Dental exams once or twice a year.  Routine eye exams. Ask your health care provider how often you should have your eyes checked.  Personal lifestyle choices, including:  Daily care of your teeth and gums.  Regular physical activity.  Eating a healthy diet.  Avoiding tobacco and drug use.  Limiting alcohol use.  Practicing safe sex.  Taking low-dose aspirin every day.  Taking vitamin and mineral supplements as recommended by your health care provider. What happens during an annual well check? The services and screenings done by your health care provider during your annual well check will depend on your age, overall health, lifestyle risk factors, and family history of disease. Counseling  Your health care provider may ask you questions about your:  Alcohol use.  Tobacco use.  Drug use.  Emotional  well-being.  Home and relationship well-being.  Sexual activity.  Eating habits.  History of falls.  Memory and ability to understand (cognition).  Work and work Statistician.  Reproductive health. Screening  You may have the following tests or measurements:  Height, weight, and BMI.  Blood pressure.  Lipid and cholesterol levels. These may be checked every 5 years, or more frequently if you are over 69 years old.  Skin check.  Lung cancer screening. You may have this screening every year starting at age 51 if you have a 30-pack-year history of smoking and currently smoke or have quit within the past 15 years.  Fecal occult blood test (FOBT) of the stool. You may have this test every year starting at age 84.  Flexible sigmoidoscopy or colonoscopy. You may have a sigmoidoscopy every 5 years or a colonoscopy every 10 years starting at age 28.  Hepatitis C blood test.  Hepatitis B blood test.  Sexually transmitted disease (STD) testing.  Diabetes screening. This is done by checking your blood sugar (glucose) after you have not eaten for a while (fasting). You may have this done every 1-3 years.  Bone density scan. This is done to screen for osteoporosis. You may have this done starting at age 49.  Mammogram. This may be done every 1-2 years. Talk to your health care provider about how often you should have regular mammograms. Talk with your health care provider about your test results, treatment options, and if necessary, the need for more tests. Vaccines  Your health care provider may recommend certain vaccines, such as:  Influenza vaccine. This is recommended every year.  Tetanus, diphtheria, and  acellular pertussis (Tdap, Td) vaccine. You may need a Td booster every 10 years.  Zoster vaccine. You may need this after age 39.  Pneumococcal 13-valent conjugate (PCV13) vaccine. One dose is recommended after age 19.  Pneumococcal polysaccharide (PPSV23) vaccine. One  dose is recommended after age 61. Talk to your health care provider about which screenings and vaccines you need and how often you need them. This information is not intended to replace advice given to you by your health care provider. Make sure you discuss any questions you have with your health care provider. Document Released: 08/31/2015 Document Revised: 04/23/2016 Document Reviewed: 06/05/2015 Elsevier Interactive Patient Education  2017 [employer] Prevention in the Home Falls can cause injuries. They can happen to people of all ages. There are many things you can do to make your home safe and to help prevent falls. What can I do on the outside of my home?  Regularly fix the edges of walkways and driveways and fix any cracks.  Remove anything that might make you trip as you walk through a door, such as a raised step or threshold.  Trim any bushes or trees on the path to your home.  Use bright outdoor lighting.  Clear any walking paths of anything that might make someone trip, such as rocks or tools.  Regularly check to see if handrails are loose or broken. Make sure that both sides of any steps have handrails.  Any raised decks and porches should have guardrails on the edges.  Have any leaves, snow, or ice cleared regularly.  Use sand or salt on walking paths during winter.  Clean up any spills in your garage right away. This includes oil or grease spills. What can I do in the bathroom?  Use night lights.  Install grab bars by the toilet and in the tub and shower. Do not use towel bars as grab bars.  Use non-skid mats or decals in the tub or shower.  If you need to sit down in the shower, use a plastic, non-slip stool.  Keep the floor dry. Clean up any water that spills on the floor as soon as it happens.  Remove soap buildup in the tub or shower regularly.  Attach bath mats securely with double-sided non-slip rug tape.  Do not have throw rugs and other  things on the floor that can make you trip. What can I do in the bedroom?  Use night lights.  Make sure that you have a light by your bed that is easy to reach.  Do not use any sheets or blankets that are too big for your bed. They should not hang down onto the floor.  Have a firm chair that has side arms. You can use this for support while you get dressed.  Do not have throw rugs and other things on the floor that can make you trip. What can I do in the kitchen?  Clean up any spills right away.  Avoid walking on wet floors.  Keep items that you use a lot in easy-to-reach places.  If you need to reach something above you, use a strong step stool that has a grab bar.  Keep electrical cords out of the way.  Do not use floor polish or wax that makes floors slippery. If you must use wax, use non-skid floor wax.  Do not have throw rugs and other things on the floor that can make you trip. What can I do with my stairs?  Do not leave any items on the stairs.  Make sure that there are handrails on both sides of the stairs and use them. Fix handrails that are broken or loose. Make sure that handrails are as long as the stairways.  Check any carpeting to make sure that it is firmly attached to the stairs. Fix any carpet that is loose or worn.  Avoid having throw rugs at the top or bottom of the stairs. If you do have throw rugs, attach them to the floor with carpet tape.  Make sure that you have a light switch at the top of the stairs and the bottom of the stairs. If you do not have them, ask someone to add them for you. What else can I do to help prevent falls?  Wear shoes that:  Do not have high heels.  Have rubber bottoms.  Are comfortable and fit you well.  Are closed at the toe. Do not wear sandals.  If you use a stepladder:  Make sure that it is fully opened. Do not climb a closed stepladder.  Make sure that both sides of the stepladder are locked into place.  Ask  someone to hold it for you, if possible.  Clearly mark and make sure that you can see:  Any grab bars or handrails.  First and last steps.  Where the edge of each step is.  Use tools that help you move around (mobility aids) if they are needed. These include:  Canes.  Walkers.  Scooters.  Crutches.  Turn on the lights when you go into a dark area. Replace any light bulbs as soon as they burn out.  Set up your furniture so you have a clear path. Avoid moving your furniture around.  If any of your floors are uneven, fix them.  If there are any pets around you, be aware of where they are.  Review your medicines with your doctor. Some medicines can make you feel dizzy. This can increase your chance of falling. Ask your doctor what other things that you can do to help prevent falls. This information is not intended to replace advice given to you by your health care provider. Make sure you discuss any questions you have with your health care provider. Document Released: 05/31/2009 Document Revised: 01/10/2016 Document Reviewed: 09/08/2014 Elsevier Interactive Patient Education  2017 [employer].

## 2019-09-28 ENCOUNTER — Other Ambulatory Visit: Payer: Medicare Other

## 2019-10-05 ENCOUNTER — Other Ambulatory Visit: Payer: Self-pay | Admitting: Internal Medicine

## 2019-10-05 DIAGNOSIS — I10 Essential (primary) hypertension: Secondary | ICD-10-CM

## 2019-10-10 ENCOUNTER — Other Ambulatory Visit: Payer: Self-pay | Admitting: *Deleted

## 2019-10-10 DIAGNOSIS — N3941 Urge incontinence: Secondary | ICD-10-CM

## 2019-10-10 MED ORDER — MIRABEGRON ER 25 MG PO TB24: 25.0000 mg | ORAL_TABLET | Freq: Every day | ORAL | 5 refills | Status: DC

## 2019-10-10 NOTE — Telephone Encounter (Signed)
Patient called and stated that the samples of  Myrebetriq worked well and would like a 30 day supply called into [facility].  Pended Rx and sent to Dr. Mariea Clonts for approval.

## 2019-10-19 ENCOUNTER — Other Ambulatory Visit: Payer: Self-pay | Admitting: Internal Medicine

## 2019-10-19 DIAGNOSIS — F411 Generalized anxiety disorder: Secondary | ICD-10-CM

## 2019-11-08 ENCOUNTER — Ambulatory Visit (INDEPENDENT_AMBULATORY_CARE_PROVIDER_SITE_OTHER): Payer: Medicare Other | Admitting: Family

## 2019-11-08 ENCOUNTER — Other Ambulatory Visit: Payer: Self-pay

## 2019-11-08 ENCOUNTER — Encounter: Payer: Self-pay | Admitting: Family

## 2019-11-08 DIAGNOSIS — F411 Generalized anxiety disorder: Secondary | ICD-10-CM | POA: Diagnosis not present

## 2019-11-08 MED ORDER — SERTRALINE HCL 50 MG PO TABS: ORAL_TABLET | ORAL | 3 refills | Status: DC

## 2019-11-08 NOTE — Patient Instructions (Signed)
-   Increase Sertraline to 50 mg tablet one by mouth daily.please take medication daily without skipping for it to be effective. - Notify provider if symptoms worsen or fail to improve.

## 2019-11-08 NOTE — Progress Notes (Signed)
Patient ID: [name], female   DOB: 16-May-1927, 86 y.o.   MRN: [id number] This service is provided via telemedicine  No vital signs collected/recorded due to the encounter was a telemedicine visit.   Location of patient (ex: home, work):  HOME  Patient consents to a telephone visit:  YES  Location of the provider (ex: office, home):  OFFICE  Name of any referring provider:  TIFFANY REED, DO  Names of all persons participating in the telemedicine service and their role in the encounter:  PATIENT, Edwin Dada, Rew, [place], DO  Time spent on call:  4:17   Provider: Dinah Ngetich FNP-C  Gayland Curry, DO  Patient Care Team: Gayland Curry, DO as PCP - General (Geriatric Medicine) Warden Fillers, MD as Consulting Physician (Ophthalmology) Griselda Miner, MD as Consulting Physician (Dermatology)  Extended Emergency Contact Information Primary Emergency Contact: Baumann,Erin Address: [place]           [address] [place] Phone: [phone] Relation: Daughter  Code Status:  DNR Goals of care: Advanced Directive information Advanced Directives 09/26/2019  Does Patient Have a Medical Advance Directive? Yes  Type of Paramedic of [place];Living will;Out of facility DNR (pink MOST or yellow form)  Does patient want to make changes to medical advance directive? No - Patient declined  Copy of [place] in Chart? Yes - validated most recent copy scanned in chart (See row information)  Would patient like information on creating a medical advance directive? -     Chief Complaint  Patient presents with  . Acute Visit    asking for medication for nerves    HPI:  Pt is a 86 y.o. female seen today for an acute visit for evaluation of anxiety.she states her daughter was taken to the Hospital yesterday due to Asthma issues having difficulty breathing.states does not know how long she will be  in the hospital.patient states this has worsen her anxiety,feels shaky and crying all the time.she request medication for anxiety. She is on sertraline 25 mg tablet daily though states does not take it on a daily bases.Has one more pill left. She denies any fever,chills,UTI or URI's symptoms.she states no fall episodes.appetite has been good.    Past Medical History:  Diagnosis Date  . Arthritis    Osteoarthritis  . Depression   . GERD (gastroesophageal reflux disease)   . Hyperlipidemia   . Osteopenia    Past Surgical History:  Procedure Laterality Date  . APPENDECTOMY  1936  . CERVICAL FUSION  2004  . COLON SURGERY  06/12/2010   Colonoscopy  . FINGER SURGERY  1989  . MELANOMA EXCISION  11/16/2017   forehead  . SPINE SURGERY  2009   Back Fusion  . TONSILLECTOMY  1941    Allergies  Allergen Reactions  . Penicillins Rash    Outpatient Encounter Medications as of 11/08/2019  Medication Sig  . brimonidine (ALPHAGAN) 0.15 % ophthalmic solution INT 1 GTT INTO OU BID  . cholecalciferol (VITAMIN D) 1000 UNITS tablet Take 1,000 Units by mouth 2 (two) times daily.   . diclofenac sodium (VOLTAREN) 1 % GEL Apply 2 g topically 4 (four) times daily.  . dorzolamide-timolol (COSOPT) 22.3-6.8 MG/ML ophthalmic solution Place 1 drop into both eyes daily.  Marland Kitchen gabapentin (NEURONTIN) 300 MG capsule Take 600 mg by mouth at bedtime.   Marland Kitchen HYDROcodone-acetaminophen (NORCO/VICODIN) 5-325 MG tablet Take 1 tablet by mouth 2 (two) times daily  as needed for severe pain.  Marland Kitchen latanoprost (XALATAN) 0.005 % ophthalmic solution Place 1 drop into both eyes at bedtime.   Marland Kitchen lisinopril (ZESTRIL) 20 MG tablet TAKE 1 TABLET BY MOUTH EVERY DAY  . mirabegron ER (MYRBETRIQ) 25 MG TB24 tablet Take 1 tablet (25 mg total) by mouth daily.  . pantoprazole (PROTONIX) 40 MG tablet TAKE 1 TABLET BY MOUTH EVERY DAY FOR ACID REFLUX.  Marland Kitchen sertraline (ZOLOFT) 25 MG tablet TAKE 1 TABLET BY MOUTH EVERYDAY AT BEDTIME  . simvastatin  (ZOCOR) 80 MG tablet Take 1 tablet (80 mg total) by mouth daily.   No facility-administered encounter medications on file as of 11/08/2019.    Review of Systems  Constitutional: Negative for appetite change, chills, fatigue and fever.  HENT: Negative for congestion, rhinorrhea, sinus pressure, sinus pain, sneezing, sore throat and trouble swallowing.   Respiratory: Negative for cough, chest tightness, shortness of breath and wheezing.   Cardiovascular: Negative for chest pain, palpitations and leg swelling.  Gastrointestinal: Negative for abdominal distention, abdominal pain, constipation, diarrhea, nausea and vomiting.  Genitourinary: Negative for difficulty urinating, dysuria, flank pain, frequency and urgency.  Skin: Negative for color change, pallor and rash.  Neurological: Negative for dizziness, speech difficulty, light-headedness and headaches.  Psychiatric/Behavioral: Negative for agitation, behavioral problems and sleep disturbance. The patient is nervous/anxious.     Immunization History  Administered Date(s) Administered  . DTaP 08/19/2007  . Fluad Quad(high Dose 65+) 05/24/2019  . Influenza, High Dose Seasonal PF 05/18/2017  . Influenza,inj,Quad PF,6+ Mos 05/31/2013, 05/23/2014, 05/04/2015, 05/04/2015, 05/01/2016, 04/01/2018  . Influenza-Unspecified 05/18/2010, 06/10/2011, 06/09/2012  . Moderna SARS-COVID-2 Vaccination 10/11/2019  . Pneumococcal Conjugate-13 01/22/2015  . Pneumococcal Polysaccharide-23 08/19/2007  . Tdap 03/03/2014  . Zoster 03/18/2013   Pertinent  Health Maintenance Due  Topic Date Due  . FOOT EXAM  08/17/2020 (Originally 02/12/1937)  . HEMOGLOBIN A1C  03/20/2020  . OPHTHALMOLOGY EXAM  05/18/2020  . INFLUENZA VACCINE  Completed  . DEXA SCAN  Completed  . PNA vac Low Risk Adult  Completed   Fall Risk  11/08/2019 09/27/2019 09/26/2019 08/01/2019 03/24/2019  Falls in the past year? 0 0 0 0 1  Number falls in past yr: 0 0 0 0 0  Injury with Fall? 0 0 0 0 0    There were no vitals filed for this visit. There is no height or weight on file to calculate BMI. Physical Exam  Unable to complete on Telephone visit.  Labs reviewed: Recent Labs    09/21/19 1003  NA 143  K 4.4  CL 107  CO2 26  GLUCOSE 99  BUN 16  CREATININE 0.65  CALCIUM 9.7   Recent Labs    09/21/19 1003  AST 16  ALT 13  BILITOT 0.4  PROT 6.6   Recent Labs    09/21/19 1003  WBC 5.7  NEUTROABS 3,352  HGB 14.6  HCT 44.4  MCV 87.6  PLT 257   Lab Results  Component Value Date   TSH 0.32 (L) 07/25/2016   Lab Results  Component Value Date   HGBA1C 5.8 (H) 09/21/2019   Lab Results  Component Value Date   CHOL 295 (H) 09/21/2019   HDL 59 09/21/2019   LDLCALC 193 (H) 09/21/2019   TRIG 231 (H) 09/21/2019   CHOLHDL 5.0 (H) 09/21/2019    Significant Diagnostic Results in last 30 days:  No results found.  Assessment/Plan   Generalized anxiety disorder Worsening symptoms due to recent daughter's hospital admission.Not taking sertraline  as directed. - Encouraged breathing exercises to for relaxation. - Advised to increase sertraline to 50 mg tablet one by mouth daily - Notify provider if symptoms worsen or fail to improve   - sertraline (ZOLOFT) 50 MG tablet; TAKE 1 TABLET BY MOUTH EVERYDAY AT BEDTIME  Dispense: 30 tablet; Refill: 3  Family/ staff Communication: Reviewed plan of care with patient  Labs/tests ordered: None   Next Appointment: 01/26/2020 with Dr.Reed.    Spent 11 minutes of non-face to face with patient    Sandrea Hughs, NP

## 2019-11-26 ENCOUNTER — Other Ambulatory Visit (INDEPENDENT_AMBULATORY_CARE_PROVIDER_SITE_OTHER): Payer: Self-pay | Admitting: Specialist

## 2019-11-26 DIAGNOSIS — M5441 Lumbago with sciatica, right side: Secondary | ICD-10-CM

## 2019-11-26 DIAGNOSIS — G8929 Other chronic pain: Secondary | ICD-10-CM

## 2019-11-29 ENCOUNTER — Ambulatory Visit: Payer: Medicare Other | Admitting: Specialist

## 2019-12-01 ENCOUNTER — Other Ambulatory Visit: Payer: Self-pay | Admitting: Internal Medicine

## 2019-12-01 ENCOUNTER — Other Ambulatory Visit (INDEPENDENT_AMBULATORY_CARE_PROVIDER_SITE_OTHER): Payer: Self-pay | Admitting: Specialist

## 2019-12-01 ENCOUNTER — Ambulatory Visit: Payer: Medicare Other | Admitting: Internal Medicine

## 2019-12-01 DIAGNOSIS — G8929 Other chronic pain: Secondary | ICD-10-CM

## 2019-12-12 ENCOUNTER — Ambulatory Visit: Payer: Medicare Other | Admitting: Specialist

## 2019-12-13 ENCOUNTER — Ambulatory Visit: Payer: Medicare Other | Admitting: Physical Medicine and Rehabilitation

## 2019-12-29 ENCOUNTER — Ambulatory Visit: Payer: Medicare Other | Admitting: Specialist

## 2020-01-02 ENCOUNTER — Other Ambulatory Visit: Payer: Self-pay | Admitting: *Deleted

## 2020-01-02 DIAGNOSIS — F411 Generalized anxiety disorder: Secondary | ICD-10-CM

## 2020-01-02 NOTE — Telephone Encounter (Signed)
I am so sorry to hear about her daughter.  If there is anything we can do to help her work through this outside of filling her zoloft, she should please call us.

## 2020-01-02 NOTE — Telephone Encounter (Signed)
Patient called requesting refill to be sent to Mail order. Pended and sent to Dr. Mariea Clonts for approval due to [place].     Patient was very upset. Stated her daughter of 84yo unexpectedly died of heart related issues. Stated that she was just outside planting flowers that day and that night she passed.

## 2020-01-03 MED ORDER — SERTRALINE HCL 50 MG PO TABS: ORAL_TABLET | ORAL | 1 refills | Status: DC

## 2020-01-06 DIAGNOSIS — L814 Other melanin hyperpigmentation: Secondary | ICD-10-CM | POA: Diagnosis not present

## 2020-01-06 DIAGNOSIS — Z85828 Personal history of other malignant neoplasm of skin: Secondary | ICD-10-CM | POA: Diagnosis not present

## 2020-01-06 DIAGNOSIS — L821 Other seborrheic keratosis: Secondary | ICD-10-CM | POA: Diagnosis not present

## 2020-01-06 DIAGNOSIS — L570 Actinic keratosis: Secondary | ICD-10-CM | POA: Diagnosis not present

## 2020-01-24 ENCOUNTER — Other Ambulatory Visit: Payer: Medicare Other

## 2020-01-24 ENCOUNTER — Other Ambulatory Visit: Payer: Self-pay

## 2020-01-24 DIAGNOSIS — E781 Pure hyperglyceridemia: Secondary | ICD-10-CM

## 2020-01-24 DIAGNOSIS — I10 Essential (primary) hypertension: Secondary | ICD-10-CM

## 2020-01-24 DIAGNOSIS — R739 Hyperglycemia, unspecified: Secondary | ICD-10-CM

## 2020-01-26 ENCOUNTER — Ambulatory Visit: Payer: Medicare Other | Admitting: Internal Medicine

## 2020-01-30 ENCOUNTER — Encounter: Payer: Self-pay | Admitting: Internal Medicine

## 2020-01-30 ENCOUNTER — Other Ambulatory Visit: Payer: Self-pay

## 2020-01-30 ENCOUNTER — Ambulatory Visit (INDEPENDENT_AMBULATORY_CARE_PROVIDER_SITE_OTHER): Payer: Medicare Other | Admitting: Internal Medicine

## 2020-01-30 VITALS — BP 112/62 | HR 60 | Temp 97.8°F | Ht 61.0 in | Wt 129.0 lb

## 2020-01-30 DIAGNOSIS — F331 Major depressive disorder, recurrent, moderate: Secondary | ICD-10-CM | POA: Diagnosis not present

## 2020-01-30 DIAGNOSIS — F4321 Adjustment disorder with depressed mood: Secondary | ICD-10-CM

## 2020-01-30 DIAGNOSIS — G8929 Other chronic pain: Secondary | ICD-10-CM

## 2020-01-30 DIAGNOSIS — M5441 Lumbago with sciatica, right side: Secondary | ICD-10-CM | POA: Diagnosis not present

## 2020-01-30 DIAGNOSIS — F411 Generalized anxiety disorder: Secondary | ICD-10-CM

## 2020-01-30 MED ORDER — SERTRALINE HCL 100 MG PO TABS: ORAL_TABLET | ORAL | 3 refills | Status: DC

## 2020-01-30 MED ORDER — GABAPENTIN 300 MG PO CAPS: ORAL_CAPSULE | ORAL | 3 refills | Status: DC

## 2020-01-30 NOTE — Patient Instructions (Signed)
Increase your zoloft to 100mg  daily  Let me know if after 2-4 weeks you are not feeling better.

## 2020-01-30 NOTE — Progress Notes (Signed)
Location:  [facility] clinic Provider: Marieke Lubke L. Mariea Clonts, D.O., C.M.D.  Code Status: DNR Goals of Care:  Advanced Directives 01/30/2020  Does Patient Have a Medical Advance Directive? Yes  Type of Advance Directive Out of facility DNR (pink MOST or yellow form)  Does patient want to make changes to medical advance directive? No - Guardian declined  Copy of [place] in Chart? -  Would patient like information on creating a medical advance directive? -  Pre-existing out of facility DNR order (yellow form or pink MOST form) Pink MOST/Yellow Form most recent copy in chart - Physician notified to receive inpatient order   Chief Complaint  Patient presents with  . Acute Visit    Scat bus paper work/ depression of daugter passing    HPI: Patient is a 86 y.o. female seen today for an acute visit for scat bus paperwork and depression after her daughter passed away.  Says she did not expect her daughter's passing.  Life is tough especially when you lose a child.  [name] was her second child.  First baby has passed away after a difficult delivery.  [name] was so good for her.  She says it's weird to look for transportation.    She has been able to sleep here more recently.   She used to get up at 10am.  Now wakes up real early like 6am and stays up until 10-11pm.  She does not think she'll ever get over it at 89.  Wonders why she's here--can't do anything, can't walk, when her daughter loved life and was active.  She wishes she was dead.  She does still have her son in [place].  He's been calling 1-2 times per week and he puts in the grocery list online and then they deliver it to her door.  He sends her other packages.  He was up here when [name] passed but they did not know she was that sick and was going to die.  He and his wife then had to go back to work.  Pt plans to stay put.  She doesn't know anyone in [place] and doesn't want to put herself on her son.  Says a lot of the people she  knows/knew are dead.  She does have friends where she lives to socialize with.  She is getting help to switch her insurance.  She played a bingo session.  Had cupcakes and ice cream at the end.  Feels like she does not want to be happy.  Is going to stop going to Dr. Amil Amen b/c she only goes about her back and we can refill her gabapentin here.  She has lost weight--she says she is back to eating now.    Past Medical History:  Diagnosis Date  . Arthritis    Osteoarthritis  . Depression   . GERD (gastroesophageal reflux disease)   . Hyperlipidemia   . Osteopenia     Past Surgical History:  Procedure Laterality Date  . APPENDECTOMY  1936  . CERVICAL FUSION  2004  . COLON SURGERY  06/12/2010   Colonoscopy  . FINGER SURGERY  1989  . MELANOMA EXCISION  11/16/2017   forehead  . SPINE SURGERY  2009   Back Fusion  . TONSILLECTOMY  1941    Allergies  Allergen Reactions  . Penicillins Rash    Outpatient Encounter Medications as of 01/30/2020  Medication Sig  . brimonidine (ALPHAGAN) 0.15 % ophthalmic solution INT 1 GTT INTO OU BID  .  cholecalciferol (VITAMIN D) 1000 UNITS tablet Take 1,000 Units by mouth 2 (two) times daily.   . diclofenac (VOLTAREN) 50 MG EC tablet TAKE 1 TABLET BY MOUTH THREE TIMES DAILY WITH FOOD  . diclofenac sodium (VOLTAREN) 1 % GEL Apply 2 g topically 4 (four) times daily.  . dorzolamide-timolol (COSOPT) 22.3-6.8 MG/ML ophthalmic solution Place 1 drop into both eyes daily.  Marland Kitchen gabapentin (NEURONTIN) 300 MG capsule Take 600 mg by mouth at bedtime.   Marland Kitchen HYDROcodone-acetaminophen (NORCO/VICODIN) 5-325 MG tablet Take 1 tablet by mouth 2 (two) times daily as needed for severe pain.  Marland Kitchen latanoprost (XALATAN) 0.005 % ophthalmic solution Place 1 drop into both eyes at bedtime.   Marland Kitchen lisinopril (ZESTRIL) 20 MG tablet TAKE 1 TABLET BY MOUTH EVERY DAY  . mirabegron ER (MYRBETRIQ) 25 MG TB24 tablet Take 1 tablet (25 mg total) by mouth daily.  . pantoprazole (PROTONIX) 40  MG tablet TAKE 1 TABLET BY MOUTH EVERY DAY FOR ACID REFLUX.  Marland Kitchen sertraline (ZOLOFT) 50 MG tablet TAKE 1 TABLET BY MOUTH EVERYDAY AT BEDTIME  . simvastatin (ZOCOR) 80 MG tablet TAKE 1 TABLET BY MOUTH DAILY   No facility-administered encounter medications on file as of 01/30/2020.    Review of Systems:  Review of Systems  Constitutional: Positive for malaise/fatigue and weight loss. Negative for chills and fever.  HENT: Negative for congestion and sore throat.   Eyes: Negative for blurred vision.  Respiratory: Negative for shortness of breath.   Cardiovascular: Negative for chest pain and leg swelling.  Gastrointestinal: Negative for abdominal pain.  Genitourinary: Negative for dysuria.  Musculoskeletal: Positive for back pain and joint pain. Negative for falls.  Neurological: Positive for tingling and sensory change. Negative for dizziness and loss of consciousness.  Endo/Heme/Allergies: Bruises/bleeds easily.  Psychiatric/Behavioral: Positive for depression. Negative for memory loss and suicidal ideas. The patient has insomnia. The patient is not nervous/anxious.        See details in hpi    Health Maintenance  Topic Date Due  . COVID-19 Vaccine (2 - Moderna 2-dose series) 11/08/2019  . FOOT EXAM  08/17/2020 (Originally 02/12/1937)  . INFLUENZA VACCINE  03/18/2020  . HEMOGLOBIN A1C  03/20/2020  . OPHTHALMOLOGY EXAM  05/18/2020  . TETANUS/TDAP  03/03/2024  . DEXA SCAN  Completed  . PNA vac Low Risk Adult  Completed    Physical Exam: Vitals:   01/30/20 1426  BP: 112/62  Pulse: 60  Temp: 97.8 F (36.6 C)  TempSrc: Temporal  SpO2: 96%  Weight: 129 lb (58.5 kg)  Height: 5\' 1"  (1.549 m)   Body mass index is 24.37 kg/m. Physical Exam Vitals reviewed.  Constitutional:      Appearance: Normal appearance.  Cardiovascular:     Rate and Rhythm: Normal rate and regular rhythm.     Pulses: Normal pulses.     Heart sounds: Normal heart sounds.  Pulmonary:     Effort: Pulmonary  effort is normal.     Breath sounds: Normal breath sounds. No wheezing.  Abdominal:     General: Bowel sounds are normal.     Palpations: Abdomen is soft.  Musculoskeletal:        General: Normal range of motion.     Right lower leg: No edema.     Left lower leg: No edema.  Skin:    General: Skin is warm and dry.  Neurological:     General: No focal deficit present.     Mental Status: She is alert and oriented  to person, place, and time.  Psychiatric:        Behavior: Behavior normal.        Thought Content: Thought content normal.        Judgment: Judgment normal.     Comments: Crying several times during visit     Labs reviewed: Basic Metabolic Panel: Recent Labs    09/21/19 1003  NA 143  K 4.4  CL 107  CO2 26  GLUCOSE 99  BUN 16  CREATININE 0.65  CALCIUM 9.7   Liver Function Tests: Recent Labs    09/21/19 1003  AST 16  ALT 13  BILITOT 0.4  PROT 6.6   No results for input(s): LIPASE, AMYLASE in the last 8760 hours. No results for input(s): AMMONIA in the last 8760 hours. CBC: Recent Labs    09/21/19 1003  WBC 5.7  NEUTROABS 3,352  HGB 14.6  HCT 44.4  MCV 87.6  PLT 257   Lipid Panel: Recent Labs    09/21/19 1003  CHOL 295*  HDL 59  LDLCALC 193*  TRIG 231*  CHOLHDL 5.0*   Lab Results  Component Value Date   HGBA1C 5.8 (H) 09/21/2019    Assessment/Plan 1. Grief -daughter just died suddenly 5 weeks ago -pt is definitely struggling -may benefit from grief counseling with [facility]  2. Generalized anxiety disorder - will increase her zoloft - sertraline (ZOLOFT) 100 MG tablet; TAKE 1 TABLET BY MOUTH EVERYDAY AT BEDTIME  Dispense: 90 tablet; Refill: 3  3. Moderate episode of recurrent major depressive disorder (HCC) -increase dose of zoloft  4. Chronic right-sided low back pain with right-sided sciatica - pt requests that I renew her gabapentin -it will now be harder for her to go to other appts and nothing has  been changing with her mgt of her back pain anytime lately  -gabapentin (NEURONTIN) 300 MG capsule; One tablet in the am and two tablets at night  Dispense: 270 capsule; Refill: 3  Labs/tests ordered:   Lab Orders  No laboratory test(s) ordered today    Next appt:  06/04/2020--keep as planned, but may return prn if needed  Ty Oshima L. Ramadan Couey, D.O. [facility] [address], [place] [address] Cell Phone (Mon-Fri 8am-5pm):  [phone] On Call:  [phone] & follow prompts after 5pm & weekends Office Phone:  [phone] Office Fax:  [phone]

## 2020-02-13 ENCOUNTER — Ambulatory Visit (INDEPENDENT_AMBULATORY_CARE_PROVIDER_SITE_OTHER): Payer: Medicare Other | Admitting: Specialist

## 2020-02-13 ENCOUNTER — Other Ambulatory Visit: Payer: Self-pay

## 2020-02-13 ENCOUNTER — Encounter: Payer: Self-pay | Admitting: Specialist

## 2020-02-13 VITALS — BP 128/67 | HR 71 | Ht 60.0 in | Wt 120.0 lb

## 2020-02-13 DIAGNOSIS — M25511 Pain in right shoulder: Secondary | ICD-10-CM | POA: Diagnosis not present

## 2020-02-13 DIAGNOSIS — M47812 Spondylosis without myelopathy or radiculopathy, cervical region: Secondary | ICD-10-CM

## 2020-02-13 DIAGNOSIS — M19049 Primary osteoarthritis, unspecified hand: Secondary | ICD-10-CM

## 2020-02-13 DIAGNOSIS — M48062 Spinal stenosis, lumbar region with neurogenic claudication: Secondary | ICD-10-CM

## 2020-02-13 DIAGNOSIS — M4156 Other secondary scoliosis, lumbar region: Secondary | ICD-10-CM

## 2020-02-13 DIAGNOSIS — Z981 Arthrodesis status: Secondary | ICD-10-CM

## 2020-02-13 DIAGNOSIS — M778 Other enthesopathies, not elsewhere classified: Secondary | ICD-10-CM

## 2020-02-13 NOTE — Progress Notes (Signed)
Office Visit Note   Patient: [name]           Date of Birth: 10-25-1926           MRN: [id number] Visit Date: 02/13/2020              Requested by: Gayland Curry, DO [place] [place],  [place] [address] PCP: Gayland Curry, DO   Assessment & Plan: Visit Diagnoses:  1. Spondylosis without myelopathy or radiculopathy, cervical region   2. Spinal stenosis of lumbar region with neurogenic claudication   3. Acute pain of right shoulder   4. Hand arthritis   5. Shoulder tendonitis, right   6. History of lumbar fusion   7. Other secondary scoliosis, lumbar region   8. Right shoulder tendonitis     Plan: Avoid overhead lifting and overhead use of the arms. Do not lift greater than 5 lbs. Adjust head rest in vehicle to prevent hyperextension if rear ended. Take extra precautions to avoid falling.Avoid overhead lifting and overhead use of the arms. Pillows to keep from sleeping directly on the shoulders Limited lifting to less than 10 lbs. Ice or heat for relief. NSAIDs are helpful, such as alleve or motrin, be careful not to use in excess as they place burdens on the kidney. Stretching exercise help and strengthening is helpful to build endurance  Follow-Up Instructions: Return in about 4 weeks (around 03/12/2020).   Orders:  No orders of the defined types were placed in this encounter.  No orders of the defined types were placed in this encounter.     Procedures: No procedures performed   Clinical Data: No additional findings.   Subjective: Chief Complaint  Patient presents with  . Right Shoulder - Follow-up    86 year old right handed female with history of neck and right shoulder pain. She is still unable to sleep on the right side. Her neck, back and shoulder are painful. She has had recent loss of daughter 2 months ago and has difficulty getting to places. Now she is having difficulty with  Getting around. No bowel or bladder changes. She is recently  changed to 51 and her son is here with her.   Review of Systems  Constitutional: Negative.   HENT: Negative.   Eyes: Negative.   Respiratory: Negative.   Cardiovascular: Negative.   Gastrointestinal: Negative.   Endocrine: Negative.   Genitourinary: Negative.   Musculoskeletal: Negative.   Skin: Negative.   Allergic/Immunologic: Negative.   Neurological: Negative.   Hematological: Negative.   Psychiatric/Behavioral: Negative.      Objective: Vital Signs: BP 128/67   Pulse 71   Ht 5' (1.524 m)   Wt 120 lb (54.4 kg)   BMI 23.44 kg/m   Physical Exam Constitutional:      Appearance: She is well-developed.  HENT:     Head: Normocephalic and atraumatic.  Eyes:     Pupils: Pupils are equal, round, and reactive to light.  Pulmonary:     Effort: Pulmonary effort is normal.     Breath sounds: Normal breath sounds.  Abdominal:     General: Bowel sounds are normal.     Palpations: Abdomen is soft.  Musculoskeletal:        General: Normal range of motion.     Cervical back: Normal range of motion and neck supple.  Skin:    General: Skin is warm and dry.  Neurological:     Mental Status: She is  alert and oriented to person, place, and time.  Psychiatric:        Behavior: Behavior normal.        Thought Content: Thought content normal.        Judgment: Judgment normal.     Ortho Exam  Specialty Comments:  No specialty comments available.  Imaging: No results found.   PMFS History: Patient Active Problem List   Diagnosis Date Noted  . Moderate episode of recurrent major depressive disorder ([place]) 08/15/2019  . Generalized anxiety disorder 08/15/2019  . Recurrent major depressive disorder, in partial remission ([place]) 03/24/2019  . Generalized osteoarthritis of multiple sites 03/24/2019  . Hyperlipidemia associated with type 2 diabetes mellitus ([place]) 10/11/2018  . Need for influenza vaccination 04/01/2018  . Malignant melanoma of left temple ([place]) 11/26/2017  .  Chronic venous insufficiency 07/26/2015  . Urge incontinence of urine 07/26/2015  . Glaucoma 07/26/2015  . Postmenopausal estrogen deficiency 07/26/2015  . Primary open angle glaucoma 01/02/2014  . Hyperglycemia 11/18/2012  . Hypertriglyceridemia, essential 11/18/2012  . Essential hypertension, benign 11/18/2012  . Peripheral neuropathy 11/18/2012   Past Medical History:  Diagnosis Date  . Arthritis    Osteoarthritis  . Depression   . GERD (gastroesophageal reflux disease)   . Hyperlipidemia   . Osteopenia     History reviewed. No pertinent family history.  Past Surgical History:  Procedure Laterality Date  . APPENDECTOMY  1936  . CERVICAL FUSION  2004  . COLON SURGERY  06/12/2010   Colonoscopy  . FINGER SURGERY  1989  . MELANOMA EXCISION  11/16/2017   forehead  . SPINE SURGERY  2009   Back Fusion  . TONSILLECTOMY  1941   Social History   Occupational History  . Not on file  Tobacco Use  . Smoking status: Former Research scientist (life sciences)  . Smokeless tobacco: Never Used  Vaping Use  . Vaping Use: Never used  Substance and Sexual Activity  . Alcohol use: No    Alcohol/week: 0.0 standard drinks    Comment: rare  . Drug use: No  . Sexual activity: Never

## 2020-02-13 NOTE — Patient Instructions (Signed)
Avoid overhead lifting and overhead use of the arms. Do not lift greater than 5 lbs. Adjust head rest in vehicle to prevent hyperextension if rear ended. Take extra precautions to avoid falling.Avoid overhead lifting and overhead use of the arms. Pillows to keep from sleeping directly on the shoulders Limited lifting to less than 10 lbs. Ice or heat for relief. NSAIDs are helpful, such as alleve or motrin, be careful not to use in excess as they place burdens on the kidney. Stretching exercise help and strengthening is helpful to build endurance.

## 2020-02-22 ENCOUNTER — Telehealth: Payer: Self-pay

## 2020-02-22 NOTE — Telephone Encounter (Signed)
Patient stated that she needs a letter stating that she uses incontinence supplies to give to her apartment management. Would like letter mailed to her.

## 2020-02-23 DIAGNOSIS — R351 Nocturia: Secondary | ICD-10-CM | POA: Diagnosis not present

## 2020-02-23 DIAGNOSIS — N3941 Urge incontinence: Secondary | ICD-10-CM | POA: Diagnosis not present

## 2020-02-23 DIAGNOSIS — N3944 Nocturnal enuresis: Secondary | ICD-10-CM | POA: Diagnosis not present

## 2020-02-23 DIAGNOSIS — R350 Frequency of micturition: Secondary | ICD-10-CM | POA: Diagnosis not present

## 2020-02-23 NOTE — Telephone Encounter (Signed)
I've completed the letter and given back to CMA to be sent to patient.

## 2020-02-27 NOTE — Telephone Encounter (Signed)
I had a verbal conversation with Dr.Reed to confirm whom she gave letter to.  Per Dr.Reed she printed letter, signed, and gave it to [facility].  Evie please document location of letter and if patient was contacted for follow-up.

## 2020-02-28 NOTE — Telephone Encounter (Signed)
Letter was placed in mail on 02/23/2020 per patient's request.

## 2020-02-28 NOTE — Telephone Encounter (Signed)
Called and left message for patient to inform her that the letter had been sent.

## 2020-02-28 NOTE — Telephone Encounter (Signed)
Patient called back and told her that form had been placed in the mail.

## 2020-03-12 DIAGNOSIS — H401122 Primary open-angle glaucoma, left eye, moderate stage: Secondary | ICD-10-CM | POA: Diagnosis not present

## 2020-03-12 DIAGNOSIS — H401113 Primary open-angle glaucoma, right eye, severe stage: Secondary | ICD-10-CM | POA: Diagnosis not present

## 2020-03-12 DIAGNOSIS — H43813 Vitreous degeneration, bilateral: Secondary | ICD-10-CM | POA: Diagnosis not present

## 2020-03-12 DIAGNOSIS — Z961 Presence of intraocular lens: Secondary | ICD-10-CM | POA: Diagnosis not present

## 2020-03-29 ENCOUNTER — Encounter: Payer: Self-pay | Admitting: Internal Medicine

## 2020-03-29 ENCOUNTER — Telehealth: Payer: Self-pay | Admitting: *Deleted

## 2020-03-29 NOTE — Telephone Encounter (Signed)
Letter completed.

## 2020-03-29 NOTE — Telephone Encounter (Signed)
Patient called requesting a letter for incontinence Supplies. Stated that the Supplies has to be listed out in order for them to be covered.   Uses: Depend Underwear Discreet Pads Pose Pads  Once completed patient would like it faxed to her apartment building, [facility], AttnOtila Kluver at Fax#: [phone]

## 2020-03-30 ENCOUNTER — Telehealth: Payer: Self-pay

## 2020-03-30 ENCOUNTER — Encounter: Payer: Self-pay | Admitting: Internal Medicine

## 2020-03-30 NOTE — Telephone Encounter (Signed)
Letter Faxed as requested.

## 2020-03-30 NOTE — Telephone Encounter (Signed)
Letter for [name] has been re-written and faxed to [phone]

## 2020-03-30 NOTE — Telephone Encounter (Addendum)
Patient called and stated that they received the letter.  Patient apologized for the inconvience but stated the letter sound good but it also has to state that she buys these supplies every week for her incontinence.  Patient stated that this comes off of rent.   Wants these added to letter, that she purchases these supplies every week.

## 2020-03-30 NOTE — Telephone Encounter (Signed)
Letter updated and printed at [facility] desk.

## 2020-04-12 ENCOUNTER — Other Ambulatory Visit: Payer: Self-pay | Admitting: Internal Medicine

## 2020-04-17 DIAGNOSIS — R351 Nocturia: Secondary | ICD-10-CM | POA: Diagnosis not present

## 2020-04-17 DIAGNOSIS — N3944 Nocturnal enuresis: Secondary | ICD-10-CM | POA: Diagnosis not present

## 2020-04-24 ENCOUNTER — Other Ambulatory Visit: Payer: Self-pay | Admitting: Family

## 2020-04-24 DIAGNOSIS — F411 Generalized anxiety disorder: Secondary | ICD-10-CM

## 2020-05-23 DIAGNOSIS — N3941 Urge incontinence: Secondary | ICD-10-CM | POA: Diagnosis not present

## 2020-05-23 DIAGNOSIS — R351 Nocturia: Secondary | ICD-10-CM | POA: Diagnosis not present

## 2020-06-04 ENCOUNTER — Other Ambulatory Visit: Payer: Self-pay

## 2020-06-04 ENCOUNTER — Encounter: Payer: Self-pay | Admitting: Internal Medicine

## 2020-06-04 ENCOUNTER — Ambulatory Visit (INDEPENDENT_AMBULATORY_CARE_PROVIDER_SITE_OTHER): Payer: Medicare Other | Admitting: Internal Medicine

## 2020-06-04 VITALS — BP 140/74 | HR 60 | Temp 97.7°F | Ht 61.0 in | Wt 119.6 lb

## 2020-06-04 DIAGNOSIS — R296 Repeated falls: Secondary | ICD-10-CM | POA: Diagnosis not present

## 2020-06-04 DIAGNOSIS — Z23 Encounter for immunization: Secondary | ICD-10-CM | POA: Diagnosis not present

## 2020-06-04 DIAGNOSIS — E1169 Type 2 diabetes mellitus with other specified complication: Secondary | ICD-10-CM

## 2020-06-04 DIAGNOSIS — F4321 Adjustment disorder with depressed mood: Secondary | ICD-10-CM

## 2020-06-04 DIAGNOSIS — E781 Pure hyperglyceridemia: Secondary | ICD-10-CM

## 2020-06-04 DIAGNOSIS — G6289 Other specified polyneuropathies: Secondary | ICD-10-CM

## 2020-06-04 DIAGNOSIS — E785 Hyperlipidemia, unspecified: Secondary | ICD-10-CM

## 2020-06-04 DIAGNOSIS — M159 Polyosteoarthritis, unspecified: Secondary | ICD-10-CM | POA: Diagnosis not present

## 2020-06-04 DIAGNOSIS — I10 Essential (primary) hypertension: Secondary | ICD-10-CM

## 2020-06-04 DIAGNOSIS — R739 Hyperglycemia, unspecified: Secondary | ICD-10-CM | POA: Diagnosis not present

## 2020-06-04 DIAGNOSIS — M5441 Lumbago with sciatica, right side: Secondary | ICD-10-CM | POA: Diagnosis not present

## 2020-06-04 DIAGNOSIS — C4339 Malignant melanoma of other parts of face: Secondary | ICD-10-CM

## 2020-06-04 DIAGNOSIS — G8929 Other chronic pain: Secondary | ICD-10-CM

## 2020-06-04 MED ORDER — HYDROCODONE-ACETAMINOPHEN 5-325 MG PO TABS: 1.0000 | ORAL_TABLET | Freq: Two times a day (BID) | ORAL | 0 refills | Status: DC | PRN

## 2020-06-04 NOTE — Patient Instructions (Signed)
Please call me if you decide you do want the physical therapy for your balance and strength.  I recommend it.  :)  Fall Prevention in the Home, Adult Falls can cause injuries and can affect people from all age groups. There are many simple things that you can do to make your home safe and to help prevent falls. Ask for help when making these changes, if needed. What actions can I take to prevent falls? General instructions  Use good lighting in all rooms. Replace any light bulbs that burn out.  Turn on lights if it is dark. Use night-lights.  Place frequently used items in easy-to-reach places. Lower the shelves around your home if necessary.  Set up furniture so that there are clear paths around it. Avoid moving your furniture around.  Remove throw rugs and other tripping hazards from the floor.  Avoid walking on wet floors.  Fix any uneven floor surfaces.  Add color or contrast paint or tape to grab bars and handrails in your home. Place contrasting color strips on the first and last steps of stairways.  When you use a stepladder, make sure that it is completely opened and that the sides are firmly locked. Have someone hold the ladder while you are using it. Do not climb a closed stepladder.  Be aware of any and all pets. What can I do in the bathroom?      Keep the floor dry. Immediately clean up any water that spills onto the floor.  Remove soap buildup in the tub or shower on a regular basis.  Use non-skid mats or decals on the floor of the tub or shower.  Attach bath mats securely with double-sided, non-slip rug tape.  If you need to sit down while you are in the shower, use a plastic, non-slip stool.  Install grab bars by the toilet and in the tub and shower. Do not use towel bars as grab bars. What can I do in the bedroom?  Make sure that a bedside light is easy to reach.  Do not use oversized bedding that drapes onto the floor.  Have a firm chair that has side  arms to use for getting dressed. What can I do in the kitchen?  Clean up any spills right away.  If you need to reach for something above you, use a sturdy step stool that has a grab bar.  Keep electrical cables out of the way.  Do not use floor polish or wax that makes floors slippery. If you must use wax, make sure that it is non-skid floor wax. What can I do in the stairways?  Do not leave any items on the stairs.  Make sure that you have a light switch at the top of the stairs and the bottom of the stairs. Have them installed if you do not have them.  Make sure that there are handrails on both sides of the stairs. Fix handrails that are broken or loose. Make sure that handrails are as long as the stairways.  Install non-slip stair treads on all stairs in your home.  Avoid having throw rugs at the top or bottom of stairways, or secure the rugs with carpet tape to prevent them from moving.  Choose a carpet design that does not hide the edge of steps on the stairway.  Check any carpeting to make sure that it is firmly attached to the stairs. Fix any carpet that is loose or worn. What can I do on  the outside of my home?  Use bright outdoor lighting.  Regularly repair the edges of walkways and driveways and fix any cracks.  Remove high doorway thresholds.  Trim any shrubbery on the main path into your home.  Regularly check that handrails are securely fastened and in good repair. Both sides of any steps should have handrails.  Install guardrails along the edges of any raised decks or porches.  Clear walkways of debris and clutter, including tools and rocks.  Have leaves, snow, and ice cleared regularly.  Use sand or salt on walkways during winter months.  In the garage, clean up any spills right away, including grease or oil spills. What other actions can I take?  Wear closed-toe shoes that fit well and support your feet. Wear shoes that have rubber soles or low heels.   Use mobility aids as needed, such as canes, walkers, scooters, and crutches.  Review your medicines with your health care provider. Some medicines can cause dizziness or changes in blood pressure, which increase your risk of falling. Talk with your health care provider about other ways that you can decrease your risk of falls. This may include working with a physical therapist or trainer to improve your strength, balance, and endurance. Where to find more information  Centers for Disease Control and Prevention, [place]: WebmailGuide.co.za  [employer] on Aging: BrainJudge.co.uk Contact a health care provider if:  You are afraid of falling at home.  You feel weak, drowsy, or dizzy at home.  You fall at home. Summary  There are many simple things that you can do to make your home safe and to help prevent falls.  Ways to make your home safe include removing tripping hazards and installing grab bars in the bathroom.  Ask for help when making these changes in your home. This information is not intended to replace advice given to you by your health care provider. Make sure you discuss any questions you have with your health care provider. Document Revised: 07/17/2017 Document Reviewed: 03/19/2017 Elsevier Patient Education  2020 [employer].

## 2020-06-04 NOTE — Progress Notes (Signed)
Location:  [facility] clinic Provider:  Tiasha Helvie L. Mariea Clonts, D.O., C.M.D.  Code Status: DNR Goals of Care:  Advanced Directives 06/04/2020  Does Patient Have a Medical Advance Directive? Yes  Type of Advance Directive Out of facility DNR (pink MOST or yellow form)  Does patient want to make changes to medical advance directive? No - Patient declined  Copy of [place] in Chart? -  Would patient like information on creating a medical advance directive? -  Pre-existing out of facility DNR order (yellow form or pink MOST form) -     Chief Complaint  Patient presents with  . Medical Management of Chronic Issues    4 month follow up   . Acute Visit    she would like you to fill/ tranfer hydrocodone and ask about sertraline dosage amt   . Health Maintenance    influenza , eye exam, A1C and Covid     HPI: Patient is a 86 y.o. female seen today for medical management of chronic diseases.    She can now talk w/o crying all of the time.  She's able to talk about her daughter's death.  She's on zoloft 100mg  daily.  She was taking 50mg  if in one room and 100mg  if in another room.    I've agreed to prescribe her hydrocodone which had been prescribed by her rheumatologist.  She had two falls a week ago.  She had laundry on a chair and had her walker--had one hand on her walker and reached for the laundry and she fell.  She got a bunch of bruises on her arms, her legs.  EMS picked her up.    The second one, she was coming from the bathroom at 1:45am, and going to the bed, and just fell.  No dizziness or blacking out, but lost balance.  Tried to get up and couldn't.  The "Jana [facility]akim" lets people in.    She's going to start having her laundry done.  It takes so long to do herself.  She might have her house cleaned, too.    She is going to think about home health therapy.  Her family had moved her furniture and gotten rid of some things before.  She had a tub/transfer bench but she  didn't like it.  She does use a grab bar and has a tub chair.    High dose flu shot given.    She is changing to [employer] from [employer].  She's been to ophthalmology soon after Erin died and she has another appt next month.  Dr. Warden Fillers does this for her.    She's tried several meds for her incontinence to no avail.  She's opted not to get PTNS or botox due to potential side effects of retention.  Goes to Dr. Elvera Lennox every 4 months for skin exam.  Had melanoma on her left temple.    Past Medical History:  Diagnosis Date  . Arthritis    Osteoarthritis  . Depression   . GERD (gastroesophageal reflux disease)   . Hyperlipidemia   . Osteopenia     Past Surgical History:  Procedure Laterality Date  . APPENDECTOMY  1936  . CERVICAL FUSION  2004  . COLON SURGERY  06/12/2010   Colonoscopy  . FINGER SURGERY  1989  . MELANOMA EXCISION  11/16/2017   forehead  . SPINE SURGERY  2009   Back Fusion  . TONSILLECTOMY  1941    Allergies  Allergen Reactions  .  Penicillins Rash    Outpatient Encounter Medications as of 06/04/2020  Medication Sig  . brimonidine (ALPHAGAN) 0.15 % ophthalmic solution INT 1 GTT INTO OU BID  . cholecalciferol (VITAMIN D) 1000 UNITS tablet Take 1,000 Units by mouth 2 (two) times daily.   . diclofenac (VOLTAREN) 50 MG EC tablet TAKE 1 TABLET BY MOUTH THREE TIMES DAILY WITH FOOD  . diclofenac sodium (VOLTAREN) 1 % GEL Apply 2 g topically 4 (four) times daily.  . dorzolamide-timolol (COSOPT) 22.3-6.8 MG/ML ophthalmic solution Place 1 drop into both eyes daily.  Marland Kitchen gabapentin (NEURONTIN) 300 MG capsule One tablet in the am and two tablets at night  . latanoprost (XALATAN) 0.005 % ophthalmic solution Place 1 drop into both eyes at bedtime.   Marland Kitchen lisinopril (ZESTRIL) 20 MG tablet TAKE 1 TABLET BY MOUTH EVERY DAY  . pantoprazole (PROTONIX) 40 MG tablet TAKE 1 TABLET BY MOUTH EVERY DAY FOR ACID REFLUX.  Marland Kitchen sertraline (ZOLOFT) 100 MG tablet TAKE  1 TABLET BY MOUTH EVERYDAY AT BEDTIME  . simvastatin (ZOCOR) 80 MG tablet TAKE 1 TABLET BY MOUTH DAILY  . [DISCONTINUED] HYDROcodone-acetaminophen (NORCO/VICODIN) 5-325 MG tablet Take 1 tablet by mouth 2 (two) times daily as needed for severe pain.  . [DISCONTINUED] mirabegron ER (MYRBETRIQ) 25 MG TB24 tablet Take 1 tablet (25 mg total) by mouth daily.  Marland Kitchen HYDROcodone-acetaminophen (NORCO/VICODIN) 5-325 MG tablet Take 1 tablet by mouth 2 (two) times daily as needed for severe pain.   No facility-administered encounter medications on file as of 06/04/2020.    Review of Systems:  Review of Systems  Constitutional: Negative for chills, fever and malaise/fatigue.  HENT: Negative for congestion and sore throat.   Eyes: Negative for blurred vision.  Respiratory: Negative for cough and shortness of breath.   Cardiovascular: Negative for chest pain and leg swelling.       Feet are swelling at end of day--does not elevate them at this point so recommended this and avoiding high sodium diet which she admits to eating  Gastrointestinal: Negative for abdominal pain, blood in stool, constipation and melena.  Genitourinary: Positive for frequency and urgency. Negative for dysuria.       Remains an annoyance   Musculoskeletal: Positive for back pain, falls and joint pain.  Skin: Negative for itching and rash.  Neurological: Positive for tingling and sensory change. Negative for dizziness and loss of consciousness.  Endo/Heme/Allergies: Bruises/bleeds easily.  Psychiatric/Behavioral: Positive for depression and memory loss. The patient is not nervous/anxious and does not have insomnia.        Grieving loss of her daughter--doing much better than last appt    Health Maintenance  Topic Date Due  . INFLUENZA VACCINE  03/18/2020  . HEMOGLOBIN A1C  03/20/2020  . OPHTHALMOLOGY EXAM  05/18/2020  . FOOT EXAM  08/17/2020 (Originally 02/12/1937)  . TETANUS/TDAP  03/03/2024  . DEXA SCAN  Completed  . COVID-19  Vaccine  Completed  . PNA vac Low Risk Adult  Completed    Physical Exam: Vitals:   06/04/20 1309  BP: 140/74  Pulse: 60  Temp: 97.7 F (36.5 C)  TempSrc: Temporal  SpO2: 98%  Weight: 119 lb 9.6 oz (54.3 kg)  Height: 5\' 1"  (1.549 m)   Body mass index is 22.6 kg/m. Physical Exam Vitals reviewed.  Constitutional:      General: She is not in acute distress.    Appearance: Normal appearance. She is not toxic-appearing.  HENT:     Head: Normocephalic and atraumatic.  Eyes:     Extraocular Movements: Extraocular movements intact.     Conjunctiva/sclera: Conjunctivae normal.     Pupils: Pupils are equal, round, and reactive to light.  Cardiovascular:     Rate and Rhythm: Normal rate and regular rhythm.     Pulses: Normal pulses.     Heart sounds: Normal heart sounds.  Pulmonary:     Effort: Pulmonary effort is normal.     Breath sounds: Normal breath sounds. No wheezing, rhonchi or rales.  Abdominal:     General: Bowel sounds are normal. There is no distension.     Palpations: Abdomen is soft.     Tenderness: There is no abdominal tenderness. There is no guarding or rebound.  Musculoskeletal:        General: Normal range of motion.     Right lower leg: No edema.     Left lower leg: No edema.  Skin:    General: Skin is warm and dry.     Comments: Ecchymoses left upper arm from one of her falls  Neurological:     General: No focal deficit present.     Mental Status: She is alert and oriented to person, place, and time.     Gait: Gait abnormal.     Comments: Uses rollator  Psychiatric:        Mood and Affect: Mood normal.        Behavior: Behavior normal.     Labs reviewed: Basic Metabolic Panel: Recent Labs    09/21/19 1003  NA 143  K 4.4  CL 107  CO2 26  GLUCOSE 99  BUN 16  CREATININE 0.65  CALCIUM 9.7   Liver Function Tests: Recent Labs    09/21/19 1003  AST 16  ALT 13  BILITOT 0.4  PROT 6.6   No results for input(s): LIPASE, AMYLASE in the  last 8760 hours. No results for input(s): AMMONIA in the last 8760 hours. CBC: Recent Labs    09/21/19 1003  WBC 5.7  NEUTROABS 3,352  HGB 14.6  HCT 44.4  MCV 87.6  PLT 257   Lipid Panel: Recent Labs    09/21/19 1003  CHOL 295*  HDL 59  LDLCALC 193*  TRIG 231*  CHOLHDL 5.0*   Lab Results  Component Value Date   HGBA1C 5.8 (H) 09/21/2019    Procedures since last visit: No results found.  Assessment/Plan 1. Frequent falls -counseled on need for home health therapy but she's thinking about it  2. Generalized osteoarthritis of multiple sites - cont tylenol for mild and norco for severe pain, stay active - HYDROcodone-acetaminophen (NORCO/VICODIN) 5-325 MG tablet; Take 1 tablet by mouth 2 (two) times daily as needed for severe pain.  Dispense: 60 tablet; Refill: 0  3. 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 Grief -coping much better now, cont zoloft 100mg  daily  5. Other polyneuropathy -affects her gait, suggested HH PT, OT but she's thinking about it  6. Malignant melanoma of left temple ([place]) -continues to follow with derm q 4 mos  7. Hyperlipidemia associated with type 2 diabetes mellitus ([place]) -f/u labs ordered  8. Need for influenza vaccination - Flu Vaccine QUAD High Dose(Fluad) given    Labs/tests ordered:  Labs already ordered were released today and should have had appropriate  associations when initially ordered  Next appt:  09/28/2020   Raun Routh L. Rayven Rettig, D.O. [facility] [address], [place] [address] Cell Phone (Mon-Fri 8am-5pm):  [phone] On Call:  [phone] & follow prompts after 5pm & weekends Office Phone:  [phone] Office Fax:  [phone]

## 2020-06-05 LAB — HEMOGLOBIN A1C
Hgb A1c MFr Bld: 5.6 % of total Hgb (ref <5.7)
Mean Plasma Glucose: 114 (calc)
eAG (mmol/L): 6.3 (calc)

## 2020-06-05 LAB — COMPREHENSIVE METABOLIC PANEL
AG Ratio: 1.8 (calc) (ref 1.0–2.5)
ALT: 17 U/L (ref 6–29)
AST: 19 U/L (ref 10–35)
Albumin: 3.8 g/dL (ref 3.6–5.1)
Alkaline phosphatase (APISO): 61 U/L (ref 37–153)
BUN: 19 mg/dL (ref 7–25)
CO2: 26 mmol/L (ref 20–32)
Calcium: 9.4 mg/dL (ref 8.6–10.4)
Chloride: 108 mmol/L (ref 98–110)
Creat: 0.65 mg/dL (ref 0.60–0.88)
Globulin: 2.1 g/dL (calc) (ref 1.9–3.7)
Glucose, Bld: 88 mg/dL (ref 65–99)
Potassium: 4.2 mmol/L (ref 3.5–5.3)
Sodium: 142 mmol/L (ref 135–146)
Total Bilirubin: 0.4 mg/dL (ref 0.2–1.2)
Total Protein: 5.9 g/dL — ABNORMAL LOW (ref 6.1–8.1)

## 2020-06-05 LAB — LIPID PANEL
Cholesterol: 229 mg/dL — ABNORMAL HIGH
HDL: 53 mg/dL
LDL Cholesterol (Calc): 143 mg/dL — ABNORMAL HIGH
Non-HDL Cholesterol (Calc): 176 mg/dL — ABNORMAL HIGH
Total CHOL/HDL Ratio: 4.3 (calc)
Triglycerides: 191 mg/dL — ABNORMAL HIGH

## 2020-06-05 LAB — CBC WITH DIFFERENTIAL/PLATELET
Absolute Monocytes: 410 cells/uL (ref 200–950)
Basophils Absolute: 51 cells/uL (ref 0–200)
Basophils Relative: 0.9 %
Eosinophils Absolute: 148 cells/uL (ref 15–500)
Eosinophils Relative: 2.6 %
HCT: 39.3 % (ref 35.0–45.0)
Hemoglobin: 12.8 g/dL (ref 11.7–15.5)
Lymphs Abs: 1739 cells/uL (ref 850–3900)
MCH: 29 pg (ref 27.0–33.0)
MCHC: 32.6 g/dL (ref 32.0–36.0)
MCV: 89.1 fL (ref 80.0–100.0)
MPV: 11.2 fL (ref 7.5–12.5)
Monocytes Relative: 7.2 %
Neutro Abs: 3352 cells/uL (ref 1500–7800)
Neutrophils Relative %: 58.8 %
Platelets: 199 10*3/uL (ref 140–400)
RBC: 4.41 10*6/uL (ref 3.80–5.10)
RDW: 13.9 % (ref 11.0–15.0)
Total Lymphocyte: 30.5 %
WBC: 5.7 10*3/uL (ref 3.8–10.8)

## 2020-06-05 NOTE — Progress Notes (Signed)
Labs are good except cholesterol, but it's better than last time.

## 2020-06-06 ENCOUNTER — Other Ambulatory Visit: Payer: Self-pay | Admitting: *Deleted

## 2020-06-06 DIAGNOSIS — M159 Polyosteoarthritis, unspecified: Secondary | ICD-10-CM

## 2020-06-06 MED ORDER — HYDROCODONE-ACETAMINOPHEN 5-325 MG PO TABS: 1.0000 | ORAL_TABLET | Freq: Two times a day (BID) | ORAL | 0 refills | Status: DC | PRN

## 2020-06-06 NOTE — Telephone Encounter (Signed)
Patient called and stated that she wants her Rx [facility] to [facility]. Stated that is where she gets it.  Pended Rx and sent to Dr. Mariea Clonts for approval.

## 2020-06-20 ENCOUNTER — Ambulatory Visit: Payer: Medicare Other | Admitting: Family Medicine

## 2020-06-25 ENCOUNTER — Ambulatory Visit: Payer: Self-pay

## 2020-06-25 ENCOUNTER — Other Ambulatory Visit: Payer: Self-pay

## 2020-06-25 ENCOUNTER — Encounter: Payer: Self-pay | Admitting: Family Medicine

## 2020-06-25 ENCOUNTER — Ambulatory Visit (INDEPENDENT_AMBULATORY_CARE_PROVIDER_SITE_OTHER): Payer: Medicare Other | Admitting: Family Medicine

## 2020-06-25 DIAGNOSIS — S62324S Displaced fracture of shaft of fourth metacarpal bone, right hand, sequela: Secondary | ICD-10-CM

## 2020-06-25 DIAGNOSIS — M79641 Pain in right hand: Secondary | ICD-10-CM | POA: Diagnosis not present

## 2020-06-25 NOTE — Patient Instructions (Signed)
    Vitamin D3:  Take 2,000 IU daily  Vitamin K2:  100 mcg daily

## 2020-06-25 NOTE — Progress Notes (Signed)
Office Visit Note   Patient: [name]           Date of Birth: 02-24-1927           MRN: [id number] Visit Date: 06/25/2020 Requested by: Gayland Curry, DO [place],  [place] [address] PCP: Gayland Curry, DO  Subjective: Chief Complaint  Patient presents with  . Right Hand - Pain    Fell 3 weeks ago, over on her right side, onto her hand. Other areas were evaluated, but not the hand. Pain is between the 4th & 5th metacarpals, and up into wrist/forearm. Swelling. Right-hand dominant.    HPI: [name] is a pleasant 86yo F presenting to clinic with concerns of right ulnar-sided hand pain since a fall approximately three weeks ago. Patient states that her hand became very swollen and painful, and she has had difficulty gripping. She has a history of surgery to her right 4th finger, and states that since this time (many years ago), she has had malrotation, which doesn't significantly bother her. She has not noticed any worsening deformity of this hand since her fall. States that the pain will occasionally radiate into her forearm, but it usually confined to the 4th and 5th fingers. Otherwise, she says she is doing well. Does admit to depression and difficulty enjoying her usual hobbies since the recent death of her daughter. Lives independently ,and is right hand dominant.               ROS:   All other systems were reviewed and are negative.  Objective: Vital Signs: There were no vitals taken for this visit.  Physical Exam:  General:  Alert and oriented, in no acute distress. Pulm:  Breathing unlabored. Psy:  Normal mood, congruent affect. Skin:  Right hand with faint bruising along dorsal aspect. Overlying skin intact. No rashes.    Right Hand:  Full ROM in wrist and fingers, without significant worsening of her pain.  Right 4th knuckle does appear depressed compared to its neighboring knuckles. 4th finger is also deviated laterally with finger flexion, though patient  states this is chronic and stable.  Tenderness to palpation along 4th and 5th metacarpal shafts. No tenderness elsewhere in the hand.  Reduced grip strength in 4th and 5th fingers due to pain.  Brisk capillary refill, and sensation in tact.   Imaging: XR Hand Complete Right  Result Date: 06/25/2020 X-rays of the right hand reveal a fourth metacarpal shaft oblique fracture with about 4 mm of shortening.  No significant callus formation yet.  There is severe DJD at the thumb CMC and MCP joints.   Assessment & Plan: 86yo F presenting to clinic with right ulnar sided hand pain, and X-rays revealing a fracture of the 4th metacarpal shaft, with approximately 55mm shortening. 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 Placed in a carpal tunnel brace to offer protection to the hand from bumping injuries.  - RTC in 3 weeks for repeated images - If function remains impaired, could consider referral to hand surgeon, however optimistic that she should heal well.  - Patient agrees with plan, with no further questions or concerns today.      Procedures: No procedures performed  No notes on file     PMFS History: Patient Active Problem List   Diagnosis Date Noted  . Moderate episode of recurrent major depressive disorder ([place]) 08/15/2019  .  Generalized anxiety disorder 08/15/2019  . Recurrent major depressive disorder, in partial remission ([place]) 03/24/2019  . Generalized osteoarthritis of multiple sites 03/24/2019  . Hyperlipidemia associated with type 2 diabetes mellitus ([place]) 10/11/2018  . Need for influenza vaccination 04/01/2018  . Malignant melanoma of left temple ([place]) 11/26/2017  . Chronic venous insufficiency 07/26/2015  . Urge incontinence of urine 07/26/2015  . Glaucoma 07/26/2015  . Postmenopausal estrogen deficiency 07/26/2015  . Primary open angle glaucoma 01/02/2014  . Hyperglycemia  11/18/2012  . Hypertriglyceridemia, essential 11/18/2012  . Essential hypertension, benign 11/18/2012  . Peripheral neuropathy 11/18/2012   Past Medical History:  Diagnosis Date  . Arthritis    Osteoarthritis  . Depression   . GERD (gastroesophageal reflux disease)   . Hyperlipidemia   . Osteopenia     History reviewed. No pertinent family history.  Past Surgical History:  Procedure Laterality Date  . APPENDECTOMY  1936  . CERVICAL FUSION  2004  . COLON SURGERY  06/12/2010   Colonoscopy  . FINGER SURGERY  1989  . MELANOMA EXCISION  11/16/2017   forehead  . SPINE SURGERY  2009   Back Fusion  . TONSILLECTOMY  1941   Social History   Occupational History  . Not on file  Tobacco Use  . Smoking status: Former Research scientist (life sciences)  . Smokeless tobacco: Never Used  Vaping Use  . Vaping Use: Never used  Substance and Sexual Activity  . Alcohol use: No    Alcohol/week: 0.0 standard drinks    Comment: rare  . Drug use: No  . Sexual activity: Never

## 2020-06-25 NOTE — Progress Notes (Signed)
I saw and examined the patient with Dr. Elouise Munroe and agree with assessment and plan as outlined.    Three weeks s/p fall resulting in right hand pain.  X-Rays show 4th MC shaft fracture with shortening.  She chronically has overlap of 4th on 5th finger during flexion s/p prior injury.  She says this has not changed.  Will treat with wrist brace, repeat x-rays in 3 weeks. Start vitamin D3 and K2.

## 2020-07-11 DIAGNOSIS — Z961 Presence of intraocular lens: Secondary | ICD-10-CM | POA: Diagnosis not present

## 2020-07-11 DIAGNOSIS — H401122 Primary open-angle glaucoma, left eye, moderate stage: Secondary | ICD-10-CM | POA: Diagnosis not present

## 2020-07-11 DIAGNOSIS — H43813 Vitreous degeneration, bilateral: Secondary | ICD-10-CM | POA: Diagnosis not present

## 2020-07-11 DIAGNOSIS — H401113 Primary open-angle glaucoma, right eye, severe stage: Secondary | ICD-10-CM | POA: Diagnosis not present

## 2020-07-16 ENCOUNTER — Other Ambulatory Visit: Payer: Self-pay

## 2020-07-16 ENCOUNTER — Ambulatory Visit (INDEPENDENT_AMBULATORY_CARE_PROVIDER_SITE_OTHER): Payer: Medicare Other

## 2020-07-16 ENCOUNTER — Encounter: Payer: Self-pay | Admitting: Family Medicine

## 2020-07-16 ENCOUNTER — Ambulatory Visit (INDEPENDENT_AMBULATORY_CARE_PROVIDER_SITE_OTHER): Payer: Medicare Other | Admitting: Family Medicine

## 2020-07-16 DIAGNOSIS — S62324S Displaced fracture of shaft of fourth metacarpal bone, right hand, sequela: Secondary | ICD-10-CM

## 2020-07-16 NOTE — Progress Notes (Signed)
I saw and examined the patient with Dr. Elouise Munroe and agree with assessment and plan as outlined.    Hand feels better.  Right foot sore since dropping a can of soda on it 3 weeks ago.  Hand x-rays look better.    Return in 3 weeks and repeat hand x-rays.  Also x-ray right foot if not improving.

## 2020-07-16 NOTE — Progress Notes (Signed)
Office Visit Note   Patient: [name]           Date of Birth: February 25, 1927           MRN: [id number] Visit Date: 07/16/2020 Requested by: Gayland Curry, DO [place],  [place] [address] PCP: Gayland Curry, DO  Subjective: Chief Complaint  Patient presents with  . Right Hand - Fracture, Follow-up    4th mc shaft fx    HPI: 86yo F presenting to clinic to follow up on Right 4th metacarpal fracture. Patient states her hand is feeling better in the three weeks since her last apt. Denies any persistent pain, and has been compliant with her splint. No numbness/tingling in the hand.  She does state that she dropped a bottle on her right foot shortly after her last appointment, and she still has pain in her 3rd and 4th toes. She's worried she may have broken her foot as well. She has been able to walk without difficulty since this injury.               ROS:   All other systems were reviewed and are negative.  Objective: Vital Signs: There were no vitals taken for this visit.  Physical Exam:  General:  Alert and oriented, in no acute distress. Pulm:  Breathing unlabored. Psy:  Normal mood, congruent affect. Skin:  Right hand without significant bruising. Right foot with mild ecchymosis over 3rd and 4th metatarsal head area. Skin intact. No rash.   Right hand with full ROM in all fingers. 4th finger with stable rotational deformity when in flexion (stable for 30+ years, per patient). Able to extend/flex fingers without difficulty.  Right foot with TTP along both 3rd and 4th distal metatarsal shafts. Able to flex/extend toes, though does endorse some pain with resisted extension. No palpable deformity of bones.   Imaging: XR Hand Complete Right  Result Date: 07/16/2020 X-Rays show stable alignment of the 4th metacarpal shaft fracture with early callous formation.   Assessment & Plan: 86yo F presenting to clinic to follow up on 4th metacarpal fracture sustained nearly 6  wks ago. Xray with callous formation apparent, which is reassuring, and patient with significant reduction in pain vs previous exam. Stable rotational deformity from remote injury, otherwise alignment is adequate.   Does have some foot pain, however is now 3 wks out from injury. It is possible she may have a small crack on a bone, however she is ambulating well. Discussed possibility of post-surgical shoe, however concerned this may increase her fall risk. Discussed wearing supportive sneakers. Will obtain Xrays at next appointment if pain persists.      Procedures: No procedures performed  No notes on file     PMFS History: Patient Active Problem List   Diagnosis Date Noted  . Moderate episode of recurrent major depressive disorder ([place]) 08/15/2019  . Generalized anxiety disorder 08/15/2019  . Recurrent major depressive disorder, in partial remission ([place]) 03/24/2019  . Generalized osteoarthritis of multiple sites 03/24/2019  . Hyperlipidemia associated with type 2 diabetes mellitus ([place]) 10/11/2018  . Need for influenza vaccination 04/01/2018  . Malignant melanoma of left temple ([place]) 11/26/2017  . Chronic venous insufficiency 07/26/2015  . Urge incontinence of urine 07/26/2015  . Glaucoma 07/26/2015  . Postmenopausal estrogen deficiency 07/26/2015  . Primary open angle glaucoma 01/02/2014  . Hyperglycemia 11/18/2012  . Hypertriglyceridemia, essential 11/18/2012  . Essential hypertension, benign 11/18/2012  . Peripheral neuropathy 11/18/2012  Past Medical History:  Diagnosis Date  . Arthritis    Osteoarthritis  . Depression   . GERD (gastroesophageal reflux disease)   . Hyperlipidemia   . Osteopenia     No family history on file.  Past Surgical History:  Procedure Laterality Date  . APPENDECTOMY  1936  . CERVICAL FUSION  2004  . COLON SURGERY  06/12/2010   Colonoscopy  . FINGER SURGERY  1989  . MELANOMA EXCISION  11/16/2017   forehead  . SPINE SURGERY  2009    Back Fusion  . TONSILLECTOMY  1941   Social History   Occupational History  . Not on file  Tobacco Use  . Smoking status: Former Research scientist (life sciences)  . Smokeless tobacco: Never Used  Vaping Use  . Vaping Use: Never used  Substance and Sexual Activity  . Alcohol use: No    Alcohol/week: 0.0 standard drinks    Comment: rare  . Drug use: No  . Sexual activity: Never

## 2020-07-20 ENCOUNTER — Other Ambulatory Visit: Payer: Self-pay | Admitting: *Deleted

## 2020-07-20 DIAGNOSIS — I10 Essential (primary) hypertension: Secondary | ICD-10-CM

## 2020-07-20 MED ORDER — LISINOPRIL 20 MG PO TABS: 20.0000 mg | ORAL_TABLET | Freq: Every day | ORAL | 3 refills | Status: DC

## 2020-07-20 NOTE — Telephone Encounter (Signed)
Received fax from pharmacy. [facility]

## 2020-07-24 DIAGNOSIS — H401113 Primary open-angle glaucoma, right eye, severe stage: Secondary | ICD-10-CM | POA: Diagnosis not present

## 2020-08-07 ENCOUNTER — Ambulatory Visit: Payer: Medicare Other | Admitting: Family Medicine

## 2020-08-27 ENCOUNTER — Ambulatory Visit: Payer: Medicare Other | Admitting: Family Medicine

## 2020-08-31 ENCOUNTER — Encounter: Payer: Self-pay | Admitting: Family Medicine

## 2020-08-31 ENCOUNTER — Ambulatory Visit (INDEPENDENT_AMBULATORY_CARE_PROVIDER_SITE_OTHER): Payer: Medicare Other | Admitting: Family Medicine

## 2020-08-31 ENCOUNTER — Other Ambulatory Visit: Payer: Self-pay

## 2020-08-31 DIAGNOSIS — S62324S Displaced fracture of shaft of fourth metacarpal bone, right hand, sequela: Secondary | ICD-10-CM

## 2020-08-31 DIAGNOSIS — R6889 Other general symptoms and signs: Secondary | ICD-10-CM | POA: Diagnosis not present

## 2020-08-31 NOTE — Progress Notes (Signed)
Office Visit Note   Patient: [name]           Date of Birth: 1926-11-20           MRN: [id number] Visit Date: 08/31/2020 Requested by: Gayland Curry, DO [place],  [place] [address] PCP: Gayland Curry, DO  Subjective: Chief Complaint  Patient presents with  . Right Hand - Follow-up, Fracture    3 mos post fall and 4th MC fx. Right hand is not hurting as much, especially if she wears the wrist splint. Wears it mainly at night, but also some during the day - depending on her activities. Will have some discomfort if she picks up a heavy cup of coffee.    HPI: She is now about 30-month status post fall resulting in right fourth metacarpal fracture.  Since last visit her pain has improved significantly.  She still wears her splint and occasionally notices some soreness when grabbing something that is heavy, but overall she feels very good.               ROS:   All other systems were reviewed and are negative.  Objective: Vital Signs: There were no vitals taken for this visit.  Physical Exam:  General:  Alert and oriented, in no acute distress. Pulm:  Breathing unlabored. Psy:  Normal mood, congruent affect.  Right hand: She is able to fully flex her fourth finger at the MCP joint and fully extend it.  There is slight shortening of the fourth metacarpal.  There is no tenderness to palpation of the metacarpal, no detectable instability and no rotational deformity of her finger.    Imaging: No x-rays today.  Assessment & Plan: 1. clinically healed right fourth metacarpal fracture -Okay to discontinue splint and follow-up as needed.     Procedures: No procedures performed        PMFS History: Patient Active Problem List   Diagnosis Date Noted  . Moderate episode of recurrent major depressive disorder ([place]) 08/15/2019  . Generalized anxiety disorder 08/15/2019  . Recurrent major depressive disorder, in partial remission ([place]) 03/24/2019  . Generalized  osteoarthritis of multiple sites 03/24/2019  . Hyperlipidemia associated with type 2 diabetes mellitus ([place]) 10/11/2018  . Need for influenza vaccination 04/01/2018  . Malignant melanoma of left temple ([place]) 11/26/2017  . Chronic venous insufficiency 07/26/2015  . Urge incontinence of urine 07/26/2015  . Glaucoma 07/26/2015  . Postmenopausal estrogen deficiency 07/26/2015  . Primary open angle glaucoma 01/02/2014  . Hyperglycemia 11/18/2012  . Hypertriglyceridemia, essential 11/18/2012  . Essential hypertension, benign 11/18/2012  . Peripheral neuropathy 11/18/2012   Past Medical History:  Diagnosis Date  . Arthritis    Osteoarthritis  . Depression   . GERD (gastroesophageal reflux disease)   . Hyperlipidemia   . Osteopenia     History reviewed. No pertinent family history.  Past Surgical History:  Procedure Laterality Date  . APPENDECTOMY  1936  . CERVICAL FUSION  2004  . COLON SURGERY  06/12/2010   Colonoscopy  . FINGER SURGERY  1989  . MELANOMA EXCISION  11/16/2017   forehead  . SPINE SURGERY  2009   Back Fusion  . TONSILLECTOMY  1941   Social History   Occupational History  . Not on file  Tobacco Use  . Smoking status: Former Research scientist (life sciences)  . Smokeless tobacco: Never Used  Vaping Use  . Vaping Use: Never used  Substance and Sexual Activity  . Alcohol use: No  Alcohol/week: 0.0 standard drinks    Comment: rare  . Drug use: No  . Sexual activity: Never

## 2020-09-06 ENCOUNTER — Other Ambulatory Visit: Payer: Self-pay | Admitting: *Deleted

## 2020-09-06 DIAGNOSIS — G8929 Other chronic pain: Secondary | ICD-10-CM

## 2020-09-06 DIAGNOSIS — F411 Generalized anxiety disorder: Secondary | ICD-10-CM

## 2020-09-06 DIAGNOSIS — I10 Essential (primary) hypertension: Secondary | ICD-10-CM

## 2020-09-06 MED ORDER — SIMVASTATIN 80 MG PO TABS
80.0000 mg | ORAL_TABLET | Freq: Every day | ORAL | 1 refills | Status: DC
Start: 2020-09-06 — End: 2020-09-13

## 2020-09-06 MED ORDER — GABAPENTIN 300 MG PO CAPS: ORAL_CAPSULE | ORAL | 1 refills | Status: DC

## 2020-09-06 MED ORDER — DICLOFENAC SODIUM 50 MG PO TBEC: 50.0000 mg | DELAYED_RELEASE_TABLET | Freq: Three times a day (TID) | ORAL | 1 refills | Status: DC

## 2020-09-06 MED ORDER — PANTOPRAZOLE SODIUM 40 MG PO TBEC: DELAYED_RELEASE_TABLET | ORAL | 1 refills | Status: DC

## 2020-09-06 MED ORDER — SERTRALINE HCL 100 MG PO TABS: ORAL_TABLET | ORAL | 1 refills | Status: DC

## 2020-09-06 MED ORDER — LISINOPRIL 20 MG PO TABS: 20.0000 mg | ORAL_TABLET | Freq: Every day | ORAL | 1 refills | Status: DC

## 2020-09-06 NOTE — Telephone Encounter (Signed)
Received refill Request from [facility] ID: [id number]  Pended Rx and sent to Dr. Mariea Clonts for approval due to [place].

## 2020-09-13 ENCOUNTER — Other Ambulatory Visit: Payer: Self-pay | Admitting: *Deleted

## 2020-09-13 MED ORDER — SIMVASTATIN 80 MG PO TABS: 80.0000 mg | ORAL_TABLET | Freq: Every day | ORAL | 1 refills | Status: DC

## 2020-09-13 NOTE — Telephone Encounter (Signed)
Patient requested refill. Stated that [facility] did not receive the Rx sent in by Dr. Mariea Clonts a couple days ago.  Resent Rx.

## 2020-09-21 DIAGNOSIS — S0501XA Injury of conjunctiva and corneal abrasion without foreign body, right eye, initial encounter: Secondary | ICD-10-CM | POA: Diagnosis not present

## 2020-09-25 DIAGNOSIS — H401122 Primary open-angle glaucoma, left eye, moderate stage: Secondary | ICD-10-CM | POA: Diagnosis not present

## 2020-09-25 DIAGNOSIS — H401113 Primary open-angle glaucoma, right eye, severe stage: Secondary | ICD-10-CM | POA: Diagnosis not present

## 2020-09-25 DIAGNOSIS — Z9889 Other specified postprocedural states: Secondary | ICD-10-CM | POA: Diagnosis not present

## 2020-09-25 DIAGNOSIS — Z961 Presence of intraocular lens: Secondary | ICD-10-CM | POA: Diagnosis not present

## 2020-09-25 DIAGNOSIS — H43813 Vitreous degeneration, bilateral: Secondary | ICD-10-CM | POA: Diagnosis not present

## 2020-09-28 ENCOUNTER — Ambulatory Visit (INDEPENDENT_AMBULATORY_CARE_PROVIDER_SITE_OTHER): Payer: Medicare HMO | Admitting: Family

## 2020-09-28 ENCOUNTER — Other Ambulatory Visit: Payer: Self-pay

## 2020-09-28 ENCOUNTER — Encounter: Payer: Self-pay | Admitting: Family

## 2020-09-28 VITALS — BP 144/80 | HR 60 | Temp 97.5°F | Resp 16 | Ht 61.0 in | Wt 116.0 lb

## 2020-09-28 DIAGNOSIS — R6889 Other general symptoms and signs: Secondary | ICD-10-CM | POA: Diagnosis not present

## 2020-09-28 DIAGNOSIS — Z0000 Encounter for general adult medical examination without abnormal findings: Secondary | ICD-10-CM

## 2020-09-28 NOTE — Patient Instructions (Signed)
[name] , Thank you for taking time to come for your Medicare Wellness Visit. I appreciate your ongoing commitment to your health goals. Please review the following plan we discussed and let me know if I can assist you in the future.   Screening recommendations/referrals: Colonoscopy: N/A  Mammogram: N/A  Bone Density N/A  Recommended yearly ophthalmology/optometry visit for glaucoma screening and checkup Recommended yearly dental visit for hygiene and checkup  Vaccinations: Influenza vaccine : Up to date  Pneumococcal vaccine : Upto date  Tdap vaccine : Up to date  Shingles vaccine : Up to date    Advanced directives: yes   Conditions/risks identified: Advance age female > 26 yrs,Hypertension,Hx of smoking,Dyslipidemia   Next appointment: 1 year    Preventive Care 28 Years and Older, Female Preventive care refers to lifestyle choices and visits with your health care provider that can promote health and wellness. What does preventive care include?  A yearly physical exam. This is also called an annual well check.  Dental exams once or twice a year.  Routine eye exams. Ask your health care provider how often you should have your eyes checked.  Personal lifestyle choices, including:  Daily care of your teeth and gums.  Regular physical activity.  Eating a healthy diet.  Avoiding tobacco and drug use.  Limiting alcohol use.  Practicing safe sex.  Taking low-dose aspirin every day.  Taking vitamin and mineral supplements as recommended by your health care provider. What happens during an annual well check? The services and screenings done by your health care provider during your annual well check will depend on your age, overall health, lifestyle risk factors, and family history of disease. Counseling  Your health care provider may ask you questions about your:  Alcohol use.  Tobacco use.  Drug use.  Emotional well-being.  Home and relationship  well-being.  Sexual activity.  Eating habits.  History of falls.  Memory and ability to understand (cognition).  Work and work Statistician.  Reproductive health. Screening  You may have the following tests or measurements:  Height, weight, and BMI.  Blood pressure.  Lipid and cholesterol levels. These may be checked every 5 years, or more frequently if you are over 47 years old.  Skin check.  Lung cancer screening. You may have this screening every year starting at age 24 if you have a 30-pack-year history of smoking and currently smoke or have quit within the past 15 years.  Fecal occult blood test (FOBT) of the stool. You may have this test every year starting at age 51.  Flexible sigmoidoscopy or colonoscopy. You may have a sigmoidoscopy every 5 years or a colonoscopy every 10 years starting at age 16.  Hepatitis C blood test.  Hepatitis B blood test.  Sexually transmitted disease (STD) testing.  Diabetes screening. This is done by checking your blood sugar (glucose) after you have not eaten for a while (fasting). You may have this done every 1-3 years.  Bone density scan. This is done to screen for osteoporosis. You may have this done starting at age 29.  Mammogram. This may be done every 1-2 years. Talk to your health care provider about how often you should have regular mammograms. Talk with your health care provider about your test results, treatment options, and if necessary, the need for more tests. Vaccines  Your health care provider may recommend certain vaccines, such as:  Influenza vaccine. This is recommended every year.  Tetanus, diphtheria, and acellular pertussis (Tdap,  Td) vaccine. You may need a Td booster every 10 years.  Zoster vaccine. You may need this after age 102.  Pneumococcal 13-valent conjugate (PCV13) vaccine. One dose is recommended after age 53.  Pneumococcal polysaccharide (PPSV23) vaccine. One dose is recommended after age  67. Talk to your health care provider about which screenings and vaccines you need and how often you need them. This information is not intended to replace advice given to you by your health care provider. Make sure you discuss any questions you have with your health care provider. Document Released: 08/31/2015 Document Revised: 04/23/2016 Document Reviewed: 06/05/2015 Elsevier Interactive Patient Education  2017 [place] Prevention in the Home Falls can cause injuries. They can happen to people of all ages. There are many things you can do to make your home safe and to help prevent falls. What can I do on the outside of my home?  Regularly fix the edges of walkways and driveways and fix any cracks.  Remove anything that might make you trip as you walk through a door, such as a raised step or threshold.  Trim any bushes or trees on the path to your home.  Use bright outdoor lighting.  Clear any walking paths of anything that might make someone trip, such as rocks or tools.  Regularly check to see if handrails are loose or broken. Make sure that both sides of any steps have handrails.  Any raised decks and porches should have guardrails on the edges.  Have any leaves, snow, or ice cleared regularly.  Use sand or salt on walking paths during winter.  Clean up any spills in your garage right away. This includes oil or grease spills. What can I do in the bathroom?  Use night lights.  Install grab bars by the toilet and in the tub and shower. Do not use towel bars as grab bars.  Use non-skid mats or decals in the tub or shower.  If you need to sit down in the shower, use a plastic, non-slip stool.  Keep the floor dry. Clean up any water that spills on the floor as soon as it happens.  Remove soap buildup in the tub or shower regularly.  Attach bath mats securely with double-sided non-slip rug tape.  Do not have throw rugs and other things on the floor that can make  you trip. What can I do in the bedroom?  Use night lights.  Make sure that you have a light by your bed that is easy to reach.  Do not use any sheets or blankets that are too big for your bed. They should not hang down onto the floor.  Have a firm chair that has side arms. You can use this for support while you get dressed.  Do not have throw rugs and other things on the floor that can make you trip. What can I do in the kitchen?  Clean up any spills right away.  Avoid walking on wet floors.  Keep items that you use a lot in easy-to-reach places.  If you need to reach something above you, use a strong step stool that has a grab bar.  Keep electrical cords out of the way.  Do not use floor polish or wax that makes floors slippery. If you must use wax, use non-skid floor wax.  Do not have throw rugs and other things on the floor that can make you trip. What can I do with my stairs?  Do not  leave any items on the stairs.  Make sure that there are handrails on both sides of the stairs and use them. Fix handrails that are broken or loose. Make sure that handrails are as long as the stairways.  Check any carpeting to make sure that it is firmly attached to the stairs. Fix any carpet that is loose or worn.  Avoid having throw rugs at the top or bottom of the stairs. If you do have throw rugs, attach them to the floor with carpet tape.  Make sure that you have a light switch at the top of the stairs and the bottom of the stairs. If you do not have them, ask someone to add them for you. What else can I do to help prevent falls?  Wear shoes that:  Do not have high heels.  Have rubber bottoms.  Are comfortable and fit you well.  Are closed at the toe. Do not wear sandals.  If you use a stepladder:  Make sure that it is fully opened. Do not climb a closed stepladder.  Make sure that both sides of the stepladder are locked into place.  Ask someone to hold it for you, if  possible.  Clearly mark and make sure that you can see:  Any grab bars or handrails.  First and last steps.  Where the edge of each step is.  Use tools that help you move around (mobility aids) if they are needed. These include:  Canes.  Walkers.  Scooters.  Crutches.  Turn on the lights when you go into a dark area. Replace any light bulbs as soon as they burn out.  Set up your furniture so you have a clear path. Avoid moving your furniture around.  If any of your floors are uneven, fix them.  If there are any pets around you, be aware of where they are.  Review your medicines with your doctor. Some medicines can make you feel dizzy. This can increase your chance of falling. Ask your doctor what other things that you can do to help prevent falls. This information is not intended to replace advice given to you by your health care provider. Make sure you discuss any questions you have with your health care provider. Document Released: 05/31/2009 Document Revised: 01/10/2016 Document Reviewed: 09/08/2014 Elsevier Interactive Patient Education  2017 [employer].

## 2020-09-28 NOTE — Progress Notes (Signed)
Subjective:   [name] is a 87 y.o. female who presents for Medicare Annual (Subsequent) preventive examination.  Review of Systems     Cardiac Risk Factors include: advanced age (>45men, >51 women);hypertension;smoking/ tobacco exposure;dyslipidemia     Objective:    Today's Vitals   09/28/20 1123 09/28/20 1157  BP: (!) 144/80   Pulse: 60   Resp: 16   Temp: (!) 97.5 F (36.4 C)   SpO2: 93%   Weight: 116 lb (52.6 kg)   Height: 5\' 1"  (1.549 m)   PainSc:  5    Body mass index is 21.92 kg/m.  Advanced Directives 09/28/2020 06/04/2020 01/30/2020 09/26/2019 03/29/2018 01/29/2017 01/22/2017  Does Patient Have a Medical Advance Directive? Yes Yes Yes Yes Yes Yes Yes  Type of Advance Directive Healthcare Power of [place] of facility DNR (pink MOST or yellow form) Out of facility DNR (pink MOST or yellow form) [place];Living will;Out of facility DNR (pink MOST or yellow form) Healthcare Power of [place];Living will [place]  Does patient want to make changes to medical advance directive? No - Patient declined No - Patient declined No - Guardian declined No - Patient declined No - Patient declined - -  Copy of [place] in Chart? Yes - validated most recent copy scanned in chart (See row information) - - Yes - validated most recent copy scanned in chart (See row information) Yes - Yes  Would patient like information on creating a medical advance directive? - - - - - - -  Pre-existing out of facility DNR order (yellow form or pink MOST form) - - Pink MOST/Yellow Form most recent copy in chart - Physician notified to receive inpatient order - - - -    Current Medications (verified) Outpatient Encounter Medications as of 09/28/2020  Medication Sig  . brimonidine (ALPHAGAN) 0.15 % ophthalmic solution INT 1 GTT INTO OU BID  . cholecalciferol (VITAMIN D) 1000 UNITS tablet Take 1,000 Units by mouth 2  (two) times daily.   . diclofenac (VOLTAREN) 50 MG EC tablet Take 1 tablet (50 mg total) by mouth 3 (three) times daily with meals.  . diclofenac sodium (VOLTAREN) 1 % GEL Apply 2 g topically 4 (four) times daily.  . dorzolamide-timolol (COSOPT) 22.3-6.8 MG/ML ophthalmic solution Place 1 drop into both eyes daily.  . Ferrous Sulfate (IRON PO) Take 1 capsule by mouth daily.  Marland Kitchen gabapentin (NEURONTIN) 300 MG capsule One tablet in the am and two tablets at night  . HYDROcodone-acetaminophen (NORCO/VICODIN) 5-325 MG tablet Take 1 tablet by mouth 2 (two) times daily as needed for severe pain.  Marland Kitchen latanoprost (XALATAN) 0.005 % ophthalmic solution Place 1 drop into both eyes at bedtime.   Marland Kitchen lisinopril (ZESTRIL) 20 MG tablet Take 1 tablet (20 mg total) by mouth daily.  . Menaquinone-7 (VITAMIN K2 PO) Take 1 capsule by mouth daily.  . pantoprazole (PROTONIX) 40 MG tablet Take one tablet by mouth once daily for acid reflux  . sertraline (ZOLOFT) 100 MG tablet Take one tablet by mouth once daily at bedtime  . simvastatin (ZOCOR) 80 MG tablet Take 1 tablet (80 mg total) by mouth daily.   No facility-administered encounter medications on file as of 09/28/2020.    Allergies (verified) Penicillins   History: Past Medical History:  Diagnosis Date  . Arthritis    Osteoarthritis  . Depression   . GERD (gastroesophageal reflux disease)   . Hyperlipidemia   .  Osteopenia    Past Surgical History:  Procedure Laterality Date  . APPENDECTOMY  1936  . CERVICAL FUSION  2004  . COLON SURGERY  06/12/2010   Colonoscopy  . FINGER SURGERY  1989  . MELANOMA EXCISION  11/16/2017   forehead  . SPINE SURGERY  2009   Back Fusion  . TONSILLECTOMY  1941   History reviewed. No pertinent family history. Social History   Socioeconomic History  . Marital status: Divorced    Spouse name: Not on file  . Number of children: Not on file  . Years of education: Not on file  . Highest education level: Not on file   Occupational History  . Not on file  Tobacco Use  . Smoking status: Former Research scientist (life sciences)  . Smokeless tobacco: Never Used  Vaping Use  . Vaping Use: Never used  Substance and Sexual Activity  . Alcohol use: No    Alcohol/week: 0.0 standard drinks    Comment: rare  . Drug use: No  . Sexual activity: Never  Other Topics Concern  . Not on file  Social History Narrative  . Not on file   Social Determinants of Health   Financial Resource Strain: Not on file  Food Insecurity: Not on file  Transportation Needs: Not on file  Physical Activity: Not on file  Stress: Not on file  Social Connections: Not on file    Tobacco Counseling Counseling given: Not Answered   Clinical Intake:  Pre-visit preparation completed: No  Pain : 0-10 Pain Score: 5  Pain Type: Chronic pain Pain Location: Back Pain Orientation: Lower Pain Radiating Towards: no Pain Descriptors / Indicators: Aching Pain Onset: Other (comment) (several years) Pain Frequency: Intermittent Pain Relieving Factors: Aleve,ice bag Effect of Pain on Daily Activities: standing  Pain Relieving Factors: Aleve,ice bag  BMI - recorded: 21.92  How often do you need to have someone help you when you read instructions, pamphlets, or other written materials from your doctor or pharmacy?: 1 - Never What is the last grade level you completed in school?: 12 Grade  Diabetic?No   Interpreter Needed?: No  Information entered by :: Nykia Turko FNP-C   Activities of Daily Living In your present state of health, do you have any difficulty performing the following activities: 09/28/2020  Hearing? N  Vision? Y  Comment follows up with [facility]  Difficulty concentrating or making decisions? Y  Comment Remembering  Walking or climbing stairs? Y  Comment uses a walker  Dressing or bathing? N  Doing errands, shopping? Y  Comment has assist with Copywriter, advertising and eating ? N  Using the Toilet? N  In the past six  months, have you accidently leaked urine? Y  Comment wears incontinent pulls ups  Do you have problems with loss of bowel control? N  Managing your Medications? N  Managing your Finances? Y  Comment son assist  Housekeeping or managing your Housekeeping? Y  Comment Has Housekeeper once a month  Some recent data might be hidden    Patient Care Team: Gayland Curry, DO as PCP - General (Geriatric Medicine) Warden Fillers, MD as Consulting Physician (Ophthalmology) Griselda Miner, MD as Consulting Physician (Dermatology)  Indicate any recent Medical Services you may have received from other than [facility] providers in the past year (date may be approximate).     Assessment:   This is a routine wellness examination for [facility].  Hearing/Vision screen  Hearing Screening   125Hz  250Hz  500Hz  1000Hz  2000Hz  3000Hz   4000Hz  6000Hz  8000Hz   Right ear:           Left ear:           Comments: Patient has no hearing concerns.  Vision Screening Comments: No Vision Concerns. Patient wears prescription glasses. Last Eye exam was 09/25/2020  Dietary issues and exercise activities discussed: Current Exercise Habits: Home exercise routine, Type of exercise: stretching (leg biking), Time (Minutes): 10, Frequency (Times/Week): 3, Weekly Exercise (Minutes/Week): 30, Intensity: Mild, Exercise limited by: None identified  Goals   None    Depression Screen PHQ 2/9 Scores 09/28/2020 06/04/2020 01/30/2020 11/08/2019 09/26/2019 03/24/2019 10/11/2018  PHQ - 2 Score 0 0 0 0 0 0 0    Fall Risk Fall Risk  09/28/2020 06/04/2020 01/30/2020 01/30/2020 11/08/2019  Falls in the past year? 1 1 1  0 0  Number falls in past yr: 1 1 0 0 0  Injury with Fall? 1 - 0 0 0  Risk for fall due to : - History of fall(s);Impaired balance/gait;Impaired mobility;Medication side effect - - -  Follow up - Falls evaluation completed;Education provided;Falls prevention discussed;Follow up appointment - - -    FALL RISK PREVENTION PERTAINING  TO THE HOME:  Any stairs in or around the home? No  If so, are there any without handrails? No  Home free of loose throw rugs in walkways, pet beds, electrical cords, etc? No  Adequate lighting in your home to reduce risk of falls? Yes   ASSISTIVE DEVICES UTILIZED TO PREVENT FALLS:  Life alert? Yes  Use of a cane, walker or w/c? Yes  Grab bars in the bathroom? Yes  Shower chair or bench in shower? Yes  Elevated toilet seat or a handicapped toilet? No   TIMED UP AND GO:  Was the test performed? Yes .  Length of time to ambulate 10 feet: 20 sec.   Gait slow and steady with assistive device  Cognitive Function: MMSE - Mini Mental State Exam 09/28/2020 03/29/2018 01/24/2016  Orientation to time 5 3 5   Orientation to time comments 2022, Winter, 2/11, Friday, February - -  Orientation to Place 5 5 5   Orientation to Place-comments [place], [place], [place], [employer]. - -  Registration 3 3 3   Registration-comments DLOW - -  Attention/ Calculation 4 5 5   Recall 1 3 3   Language- name 2 objects 2 2 2   Language- repeat 1 1 1   Language- follow 3 step command 3 3 3   Language- read & follow direction 1 1 1   Write a sentence 1 1 1   Copy design 0 0 1  Total score 26 27 30      6CIT Screen 09/27/2019  What Year? 4 points  What month? 0 points  What time? 0 points  Count back from 20 2 points  Months in reverse 0 points  Repeat phrase 0 points  Total Score 6    Immunizations Immunization History  Administered Date(s) Administered  . DTaP 08/19/2007  . Fluad Quad(high Dose 65+) 05/24/2019, 06/04/2020  . Influenza, High Dose Seasonal PF 05/18/2017  . Influenza,inj,Quad PF,6+ Mos 05/31/2013, 05/23/2014, 05/04/2015, 05/04/2015, 05/01/2016, 04/01/2018  . Influenza-Unspecified 05/18/2010, 06/10/2011, 06/09/2012  . Moderna Sars-Covid-2 Vaccination 10/11/2019, 11/17/2019  . Pneumococcal Conjugate-13 01/22/2015  . Pneumococcal Polysaccharide-23 08/19/2007  . Tdap 03/03/2014  .  Zoster 03/18/2013    TDAP status: Up to date  Flu Vaccine status: Up to date  Pneumococcal vaccine status: Up to date  Covid-19 vaccine status: Information provided on how to obtain vaccines.  Qualifies for Shingles Vaccine? Yes   Zostavax completed No   Shingrix Completed?: No.    Education has been provided regarding the importance of this vaccine. Patient has been advised to call insurance company to determine out of pocket expense if they have not yet received this vaccine. Advised may also receive vaccine at local pharmacy or Health Dept. Verbalized acceptance and understanding.  Screening Tests Health Maintenance  Topic Date Due  . FOOT EXAM  Never done  . COVID-19 Vaccine (3 - Moderna risk 4-dose series) 12/15/2019  . OPHTHALMOLOGY EXAM  05/18/2020  . HEMOGLOBIN A1C  12/03/2020  . TETANUS/TDAP  03/03/2024  . INFLUENZA VACCINE  Completed  . DEXA SCAN  Completed  . PNA vac Low Risk Adult  Completed    Health Maintenance  Health Maintenance Due  Topic Date Due  . FOOT EXAM  Never done  . COVID-19 Vaccine (3 - Moderna risk 4-dose series) 12/15/2019  . OPHTHALMOLOGY EXAM  05/18/2020    Colorectal cancer screening: No longer required.   Mammogram status: No longer required due to age .  Bone Density status: Ordered declined. Pt provided with contact info and advised to call to schedule appt.  Lung Cancer Screening: (Low Dose CT Chest recommended if Age 11-80 years, 30 pack-year currently smoking OR have quit w/in 15years.) does not qualify.   Lung Cancer Screening Referral: No   Additional Screening:  Hepatitis C Screening: does not qualify; Completed No   Vision Screening: Recommended annual ophthalmology exams for early detection of glaucoma and other disorders of the eye. Is the patient up to date with their annual eye exam?  Yes  Who is the provider or what is the name of the office in which the patient attends annual eye exams? Dr. Delmar Landau  If pt  is not established with a provider, would they like to be referred to a provider to establish care? No .   Dental Screening: Recommended annual dental exams for proper oral hygiene  Community Resource Referral / Chronic Care Management: CRR required this visit?  No   CCM required this visit?  No      Plan:   - COVID-19 booster   I have personally reviewed and noted the following in the patient's chart:   . Medical and social history . Use of alcohol, tobacco or illicit drugs  . Current medications and supplements . Functional ability and status . Nutritional status . Physical activity . Advanced directives . List of other physicians . Hospitalizations, surgeries, and ER visits in previous 12 months . Vitals . Screenings to include cognitive, depression, and falls . Referrals and appointments  In addition, I have reviewed and discussed with patient certain preventive protocols, quality metrics, and best practice recommendations. A written personalized care plan for preventive services as well as general preventive health recommendations were provided to patient.     Sandrea Hughs, NP   09/28/2020   Nurse Notes: Advised to get COVID-19 booster vaccine

## 2020-10-08 ENCOUNTER — Encounter: Payer: Self-pay | Admitting: Internal Medicine

## 2020-10-24 ENCOUNTER — Other Ambulatory Visit: Payer: Self-pay | Admitting: Internal Medicine

## 2020-10-24 DIAGNOSIS — I10 Essential (primary) hypertension: Secondary | ICD-10-CM

## 2020-11-12 DIAGNOSIS — L738 Other specified follicular disorders: Secondary | ICD-10-CM | POA: Diagnosis not present

## 2020-11-12 DIAGNOSIS — D2239 Melanocytic nevi of other parts of face: Secondary | ICD-10-CM | POA: Diagnosis not present

## 2020-11-12 DIAGNOSIS — Z85828 Personal history of other malignant neoplasm of skin: Secondary | ICD-10-CM | POA: Diagnosis not present

## 2020-11-12 DIAGNOSIS — D044 Carcinoma in situ of skin of scalp and neck: Secondary | ICD-10-CM | POA: Diagnosis not present

## 2020-11-12 DIAGNOSIS — L570 Actinic keratosis: Secondary | ICD-10-CM | POA: Diagnosis not present

## 2020-11-12 DIAGNOSIS — L905 Scar conditions and fibrosis of skin: Secondary | ICD-10-CM | POA: Diagnosis not present

## 2020-11-12 DIAGNOSIS — R6889 Other general symptoms and signs: Secondary | ICD-10-CM | POA: Diagnosis not present

## 2020-11-19 ENCOUNTER — Ambulatory Visit: Payer: Medicare HMO | Admitting: Internal Medicine

## 2020-11-27 ENCOUNTER — Ambulatory Visit: Payer: Medicare HMO | Admitting: Family Medicine

## 2020-12-04 ENCOUNTER — Other Ambulatory Visit: Payer: Self-pay | Admitting: Internal Medicine

## 2020-12-04 DIAGNOSIS — I10 Essential (primary) hypertension: Secondary | ICD-10-CM

## 2020-12-04 DIAGNOSIS — F411 Generalized anxiety disorder: Secondary | ICD-10-CM

## 2020-12-04 DIAGNOSIS — G8929 Other chronic pain: Secondary | ICD-10-CM

## 2020-12-05 NOTE — Telephone Encounter (Signed)
Pharmacy requested refill.  Pended Rx's and sent to Dr. Sabra Heck for approval due to [place] Warning.

## 2020-12-18 ENCOUNTER — Ambulatory Visit (INDEPENDENT_AMBULATORY_CARE_PROVIDER_SITE_OTHER): Payer: Medicare HMO | Admitting: Family Medicine

## 2020-12-18 ENCOUNTER — Encounter: Payer: Self-pay | Admitting: Family Medicine

## 2020-12-18 ENCOUNTER — Other Ambulatory Visit: Payer: Self-pay

## 2020-12-18 VITALS — BP 114/68 | HR 54 | Temp 97.3°F | Ht 61.0 in | Wt 111.0 lb

## 2020-12-18 DIAGNOSIS — R6889 Other general symptoms and signs: Secondary | ICD-10-CM | POA: Diagnosis not present

## 2020-12-18 DIAGNOSIS — M159 Polyosteoarthritis, unspecified: Secondary | ICD-10-CM

## 2020-12-18 DIAGNOSIS — E785 Hyperlipidemia, unspecified: Secondary | ICD-10-CM | POA: Diagnosis not present

## 2020-12-18 DIAGNOSIS — G6289 Other specified polyneuropathies: Secondary | ICD-10-CM

## 2020-12-18 DIAGNOSIS — E1169 Type 2 diabetes mellitus with other specified complication: Secondary | ICD-10-CM | POA: Diagnosis not present

## 2020-12-18 DIAGNOSIS — N3941 Urge incontinence: Secondary | ICD-10-CM | POA: Diagnosis not present

## 2020-12-18 NOTE — Patient Instructions (Signed)
Stop Zocor Return in 6 months Get Covid when you can

## 2020-12-18 NOTE — Progress Notes (Signed)
Provider:  Alain Honey, MD  Careteam: Patient Care Team: Wardell Honour, MD as PCP - General (Family Medicine) Warden Fillers, MD as Consulting Physician (Ophthalmology) Griselda Miner, MD as Consulting Physician (Dermatology)  PLACE OF SERVICE:  [place] Directive information Does Patient Have a Medical Advance Directive?: Yes, Type of Advance Directive: [place];Out of facility DNR (pink MOST or yellow form), Pre-existing out of facility DNR order (yellow form or pink MOST form): Yellow form placed in chart (order not valid for inpatient use), Does patient want to make changes to medical advance directive?: No - Patient declined  Allergies  Allergen Reactions  . Penicillins Rash    Chief Complaint  Patient presents with  . Medical Management of Chronic Issues    6-7 month follow-up/ Discuss need for A1c. High fall risk.      HPI: Patient is a 87 y.o. female \.  She has depression secondary to loss of her daughter a little over 1 year ago, urge incontinence, probably osteoarthritis and hyperlipidemia. She is really doing well.  She has almost recovered from the depression when her daughter died.  She lives alone.  She has a son who lives in [place] who is only living relative. She takes gabapentin for pains in her hands and fingers which is probably secondary to cervical disc disease.  She also has hydrocodone that she takes occasionally for low back pain. We reviewed her medicines and mutually decided that she probably could stop the statin at her age.  She seemed happy to do that.  She wants to continue with the sertraline for depression.  She no longer takes medicine for incontinence as she felt it was not effective.  Review of Systems:  Review of Systems  Constitutional: Negative.   HENT: Negative.   Eyes: Negative.   Respiratory: Negative.   Cardiovascular: Negative.   Gastrointestinal: Negative.   Genitourinary: Positive  for urgency.  Musculoskeletal: Positive for back pain, falls and myalgias.  Neurological: Negative.   Psychiatric/Behavioral: Positive for depression.  All other systems reviewed and are negative.   Past Medical History:  Diagnosis Date  . Arthritis    Osteoarthritis  . Depression   . GERD (gastroesophageal reflux disease)   . Hyperlipidemia   . Osteopenia    Past Surgical History:  Procedure Laterality Date  . APPENDECTOMY  1936  . CERVICAL FUSION  2004  . COLON SURGERY  06/12/2010   Colonoscopy  . FINGER SURGERY  1989  . MELANOMA EXCISION  11/16/2017   forehead  . SPINE SURGERY  2009   Back Fusion  . TONSILLECTOMY  1941   Social History:   reports that she has quit smoking. She has never used smokeless tobacco. She reports current alcohol use. She reports that she does not use drugs.  History reviewed. No pertinent family history.  Medications: Patient's Medications  New Prescriptions   No medications on file  Previous Medications   BRIMONIDINE (ALPHAGAN) 0.15 % OPHTHALMIC SOLUTION    INT 1 GTT INTO OU BID   CHOLECALCIFEROL (VITAMIN D) 1000 UNITS TABLET    Take 1,000 Units by mouth 2 (two) times daily.    DICLOFENAC (VOLTAREN) 50 MG EC TABLET    TAKE 1 TABLET THREE TIMES DAILY WITH MEALS   DICLOFENAC SODIUM (VOLTAREN) 1 % GEL    Apply 2 g topically 4 (four) times daily.   DORZOLAMIDE-TIMOLOL (COSOPT) 22.3-6.8 MG/ML OPHTHALMIC SOLUTION    Place 1 drop into both eyes  daily.   FERROUS SULFATE (IRON PO)    Take 1 capsule by mouth daily.   GABAPENTIN (NEURONTIN) 300 MG CAPSULE    One tablet in the am and two tablets at night   HYDROCODONE-ACETAMINOPHEN (NORCO/VICODIN) 5-325 MG TABLET    Take 1 tablet by mouth 2 (two) times daily as needed for severe pain.   LATANOPROST (XALATAN) 0.005 % OPHTHALMIC SOLUTION    Place 1 drop into both eyes at bedtime.    LISINOPRIL (ZESTRIL) 20 MG TABLET    TAKE 1 TABLET EVERY DAY   MENAQUINONE-7 (VITAMIN K2 PO)    Take 1 capsule by mouth  daily.   PANTOPRAZOLE (PROTONIX) 40 MG TABLET    TAKE 1 TABLET ONE TIME DAILY FOR ACID REFLUX   SERTRALINE (ZOLOFT) 100 MG TABLET    TAKE 1 TABLET ONE TIME DAILY AT BEDTIME   SIMVASTATIN (ZOCOR) 80 MG TABLET    TAKE 1 TABLET EVERY DAY  Modified Medications   No medications on file  Discontinued Medications   No medications on file    Physical Exam:  Vitals:   12/18/20 1413  BP: 114/68  Pulse: (!) 54  Temp: (!) 97.3 F (36.3 C)  TempSrc: Temporal  SpO2: 96%  Weight: 111 lb (50.3 kg)  Height: 5\' 1"  (1.549 m)   Body mass index is 20.97 kg/m. Wt Readings from Last 3 Encounters:  12/18/20 111 lb (50.3 kg)  09/28/20 116 lb (52.6 kg)  06/04/20 119 lb 9.6 oz (54.3 kg)    Physical Exam Vitals and nursing note reviewed.  Constitutional:      Appearance: Normal appearance.  HENT:     Head: Normocephalic.     Mouth/Throat:     Mouth: Mucous membranes are moist.  Cardiovascular:     Rate and Rhythm: Normal rate and regular rhythm.  Pulmonary:     Effort: Pulmonary effort is normal.     Breath sounds: Normal breath sounds.  Abdominal:     General: Abdomen is flat. Bowel sounds are normal.  Musculoskeletal:     Comments: She has arthritic deformities in both hands  Neurological:     General: No focal deficit present.     Mental Status: She is alert and oriented to person, place, and time.  Psychiatric:        Mood and Affect: Mood normal.        Thought Content: Thought content normal.        Judgment: Judgment normal.     Labs reviewed: Basic Metabolic Panel: Recent Labs    06/04/20 1402  NA 142  K 4.2  CL 108  CO2 26  GLUCOSE 88  BUN 19  CREATININE 0.65  CALCIUM 9.4   Liver Function Tests: Recent Labs    06/04/20 1402  AST 19  ALT 17  BILITOT 0.4  PROT 5.9*   No results for input(s): LIPASE, AMYLASE in the last 8760 hours. No results for input(s): AMMONIA in the last 8760 hours. CBC: Recent Labs    06/04/20 1402  WBC 5.7  NEUTROABS 3,352   HGB 12.8  HCT 39.3  MCV 89.1  PLT 199   Lipid Panel: Recent Labs    06/04/20 1402  CHOL 229*  HDL 53  LDLCALC 143*  TRIG 191*  CHOLHDL 4.3   TSH: No results for input(s): TSH in the last 8760 hours. A1C: Lab Results  Component Value Date   HGBA1C 5.6 06/04/2020     Assessment/Plan 1. Hyperlipidemia associated with type  2 diabetes mellitus ([place]) With no history of coronary disease or diabetes we mutually decided to discontinue the statin.  2. Other polyneuropathy Takes gabapentin.  Probably secondary to cervical disc disease causing hand pain  3. Generalized osteoarthritis of multiple sites Takes hydrocodone occasionally; not a major issue  4. Urge incontinence of urine Has tried Vesicare without success and discontinued.    Alain Honey, MD [facility] [phone]

## 2021-01-23 DIAGNOSIS — H401122 Primary open-angle glaucoma, left eye, moderate stage: Secondary | ICD-10-CM | POA: Diagnosis not present

## 2021-01-23 DIAGNOSIS — H401113 Primary open-angle glaucoma, right eye, severe stage: Secondary | ICD-10-CM | POA: Diagnosis not present

## 2021-01-23 DIAGNOSIS — H43813 Vitreous degeneration, bilateral: Secondary | ICD-10-CM | POA: Diagnosis not present

## 2021-01-23 DIAGNOSIS — Z961 Presence of intraocular lens: Secondary | ICD-10-CM | POA: Diagnosis not present

## 2021-02-14 DIAGNOSIS — Z0100 Encounter for examination of eyes and vision without abnormal findings: Secondary | ICD-10-CM | POA: Diagnosis not present

## 2021-02-28 DIAGNOSIS — Z85820 Personal history of malignant melanoma of skin: Secondary | ICD-10-CM | POA: Diagnosis not present

## 2021-02-28 DIAGNOSIS — Z85828 Personal history of other malignant neoplasm of skin: Secondary | ICD-10-CM | POA: Diagnosis not present

## 2021-02-28 DIAGNOSIS — L821 Other seborrheic keratosis: Secondary | ICD-10-CM | POA: Diagnosis not present

## 2021-02-28 DIAGNOSIS — D045 Carcinoma in situ of skin of trunk: Secondary | ICD-10-CM | POA: Diagnosis not present

## 2021-02-28 DIAGNOSIS — D0461 Carcinoma in situ of skin of right upper limb, including shoulder: Secondary | ICD-10-CM | POA: Diagnosis not present

## 2021-02-28 DIAGNOSIS — D1801 Hemangioma of skin and subcutaneous tissue: Secondary | ICD-10-CM | POA: Diagnosis not present

## 2021-03-08 ENCOUNTER — Other Ambulatory Visit: Payer: Self-pay

## 2021-03-08 ENCOUNTER — Ambulatory Visit (INDEPENDENT_AMBULATORY_CARE_PROVIDER_SITE_OTHER): Payer: Medicare HMO | Admitting: Adult Health

## 2021-03-08 ENCOUNTER — Encounter: Payer: Self-pay | Admitting: Adult Health

## 2021-03-08 DIAGNOSIS — K5904 Chronic idiopathic constipation: Secondary | ICD-10-CM

## 2021-03-08 DIAGNOSIS — K219 Gastro-esophageal reflux disease without esophagitis: Secondary | ICD-10-CM | POA: Insufficient documentation

## 2021-03-08 MED ORDER — PANTOPRAZOLE SODIUM 40 MG PO TBEC
40.0000 mg | DELAYED_RELEASE_TABLET | Freq: Two times a day (BID) | ORAL | 0 refills | Status: DC
Start: 2021-03-08 — End: 2021-03-12

## 2021-03-08 MED ORDER — LINACLOTIDE 72 MCG PO CAPS: 72.0000 ug | ORAL_CAPSULE | Freq: Every day | ORAL | 3 refills | Status: DC

## 2021-03-08 NOTE — Progress Notes (Signed)
Location:  [facility]   Place of Service:   clinic   CODE STATUS: dnr   Allergies  Allergen Reactions  . Penicillins Rash    Chief Complaint  Patient presents with  . Acute Visit    Patient presents today for indigestion and pain in chest when swallowing for about 3 weeks now. She report vomiting just liquids. She reports taking Protonix. She report constipation as well. She reports taking miralax and colace with no relieve.    HPI:  She has been taking protonix 40 mg daily for the past 20 years or so. For the past 3 weeks she has had worsening heart burn. She is having worsening constipation for the past 3 days. Has had a decreased appetite. Has had one episode of vomiting. No fevers. The gerd is all day long.   Past Medical History:  Diagnosis Date  . Arthritis    Osteoarthritis  . Depression   . GERD (gastroesophageal reflux disease)   . Hyperlipidemia   . Osteopenia     Past Surgical History:  Procedure Laterality Date  . APPENDECTOMY  1936  . CERVICAL FUSION  2004  . COLON SURGERY  06/12/2010   Colonoscopy  . FINGER SURGERY  1989  . MELANOMA EXCISION  11/16/2017   forehead  . SPINE SURGERY  2009   Back Fusion  . TONSILLECTOMY  1941    Social History   Socioeconomic History  . Marital status: Divorced    Spouse name: Not on file  . Number of children: Not on file  . Years of education: Not on file  . Highest education level: Not on file  Occupational History  . Not on file  Tobacco Use  . Smoking status: Former  . Smokeless tobacco: Never  . Tobacco comments:    Quit at age 50   Vaping Use  . Vaping Use: Never used  Substance and Sexual Activity  . Alcohol use: Yes    Alcohol/week: 0.0 standard drinks    Comment: Occasionally   . Drug use: No  . Sexual activity: Never  Other Topics Concern  . Not on file  Social History Narrative  . Not on file   Social Determinants of Health   Financial Resource Strain: Not on file  Food Insecurity: Not  on file  Transportation Needs: Not on file  Physical Activity: Not on file  Stress: Not on file  Social Connections: Not on file  Intimate Partner Violence: Not on file   No family history on file.    VITAL SIGNS BP 112/64 (BP Location: Left Arm, Patient Position: Sitting, Cuff Size: Normal)   Pulse 79   Temp 98 F (36.7 C) (Temporal)   Ht '5\' 1"'$  (1.549 m)   Wt 112 lb 6.4 oz (51 kg)   SpO2 97%   BMI 21.24 kg/m   Outpatient Encounter Medications as of 03/08/2021  Medication Sig  . brimonidine (ALPHAGAN) 0.15 % ophthalmic solution INT 1 GTT INTO OU BID  . cholecalciferol (VITAMIN D) 1000 UNITS tablet Take 1,000 Units by mouth 2 (two) times daily.   . diclofenac (VOLTAREN) 50 MG EC tablet TAKE 1 TABLET THREE TIMES DAILY WITH MEALS  . diclofenac sodium (VOLTAREN) 1 % GEL Apply 2 g topically 4 (four) times daily.  . dorzolamide-timolol (COSOPT) 22.3-6.8 MG/ML ophthalmic solution Place 1 drop into both eyes daily.  . Ferrous Sulfate (IRON PO) Take 1 capsule by mouth daily.  Marland Kitchen gabapentin (NEURONTIN) 300 MG capsule One tablet in the  am and two tablets at night  . HYDROcodone-acetaminophen (NORCO/VICODIN) 5-325 MG tablet Take 1 tablet by mouth 2 (two) times daily as needed for severe pain.  Marland Kitchen latanoprost (XALATAN) 0.005 % ophthalmic solution Place 1 drop into both eyes at bedtime.   Marland Kitchen lisinopril (ZESTRIL) 20 MG tablet TAKE 1 TABLET EVERY DAY  . Menaquinone-7 (VITAMIN K2 PO) Take 1 capsule by mouth daily.  . pantoprazole (PROTONIX) 40 MG tablet TAKE 1 TABLET ONE TIME DAILY FOR ACID REFLUX  . sertraline (ZOLOFT) 100 MG tablet TAKE 1 TABLET ONE TIME DAILY AT BEDTIME  . simvastatin (ZOCOR) 80 MG tablet TAKE 1 TABLET EVERY DAY   No facility-administered encounter medications on file as of 03/08/2021.     SIGNIFICANT DIAGNOSTIC EXAMS   Review of Systems  Constitutional:  Negative for malaise/fatigue.  Respiratory:  Negative for cough and shortness of breath.   Cardiovascular:  Negative  for chest pain, palpitations and leg swelling.  Gastrointestinal:  Positive for constipation, heartburn and vomiting. Negative for abdominal pain.  Musculoskeletal:  Negative for back pain, joint pain and myalgias.  Skin: Negative.   Neurological:  Negative for dizziness.  Psychiatric/Behavioral:  The patient is not nervous/anxious.     Physical Exam Constitutional:      General: She is not in acute distress.    Appearance: She is well-developed. She is not diaphoretic.  Neck:     Thyroid: No thyromegaly.  Cardiovascular:     Rate and Rhythm: Normal rate and regular rhythm.     Pulses: Normal pulses.     Heart sounds: Normal heart sounds.  Pulmonary:     Effort: Pulmonary effort is normal. No respiratory distress.     Breath sounds: Normal breath sounds.  Abdominal:     General: Bowel sounds are normal. There is no distension.     Palpations: Abdomen is soft.     Tenderness: There is no abdominal tenderness.  Musculoskeletal:        General: Normal range of motion.     Cervical back: Neck supple.     Right lower leg: No edema.     Left lower leg: No edema.  Lymphadenopathy:     Cervical: No cervical adenopathy.  Skin:    General: Skin is warm and dry.  Neurological:     Mental Status: She is alert and oriented to person, place, and time.  Psychiatric:        Mood and Affect: Mood normal.     ASSESSMENT/ PLAN:   TODAY  GERD without esophagitis Idiopathic constipation  Is worse Will increase protonix to 40 mg twice daily  Will begin linzess 72 mcg daily  Will stop pepto bismol She knows to call back in one week if not improved.   Ok Edwards NP [facility]  Contact [phone] Monday through Friday 8am- 5pm  After hours call [phone]

## 2021-03-08 NOTE — Patient Instructions (Signed)
Change your protonix to twice daily  Begin linzess 72 mcg daily prior to breakfast Use dulcolax suppository daily as needed Call Dr. Sabra Heck if not better in one week.

## 2021-03-12 ENCOUNTER — Other Ambulatory Visit: Payer: Self-pay | Admitting: Adult Health

## 2021-03-12 ENCOUNTER — Ambulatory Visit (INDEPENDENT_AMBULATORY_CARE_PROVIDER_SITE_OTHER): Payer: Medicare HMO | Admitting: Family Medicine

## 2021-03-12 DIAGNOSIS — K219 Gastro-esophageal reflux disease without esophagitis: Secondary | ICD-10-CM | POA: Diagnosis not present

## 2021-03-13 ENCOUNTER — Encounter: Payer: Self-pay | Admitting: Family Medicine

## 2021-03-13 ENCOUNTER — Ambulatory Visit (INDEPENDENT_AMBULATORY_CARE_PROVIDER_SITE_OTHER): Payer: Medicare HMO | Admitting: Family Medicine

## 2021-03-13 ENCOUNTER — Telehealth: Payer: Self-pay | Admitting: *Deleted

## 2021-03-13 ENCOUNTER — Other Ambulatory Visit: Payer: Self-pay

## 2021-03-13 VITALS — BP 114/68 | HR 64 | Temp 97.1°F | Ht 61.0 in | Wt 111.0 lb

## 2021-03-13 DIAGNOSIS — Z1211 Encounter for screening for malignant neoplasm of colon: Secondary | ICD-10-CM

## 2021-03-13 DIAGNOSIS — K219 Gastro-esophageal reflux disease without esophagitis: Secondary | ICD-10-CM

## 2021-03-13 DIAGNOSIS — F3341 Major depressive disorder, recurrent, in partial remission: Secondary | ICD-10-CM | POA: Diagnosis not present

## 2021-03-13 MED ORDER — LANSOPRAZOLE 30 MG PO CPDR: 30.0000 mg | DELAYED_RELEASE_CAPSULE | Freq: Every day | ORAL | 1 refills | Status: DC

## 2021-03-13 NOTE — Telephone Encounter (Signed)
Filled out [place] for patient since she has no Transportation per patient's request.   Faxed form to Fax: [phone] for approval.  Awaiting response.

## 2021-03-13 NOTE — Patient Instructions (Signed)
Use Gaviscon along with the new acid reducing pill Watch diet and read over instructions about feels that cause more acid production If symptoms are not improved in 2 weeks, call for referral to gastroenterologist for possible endoscopy evaluation Try to avoid foods which increase acid production, such as spicy foods, tomato-based products, milk and dairy, NSAIDs like Voltaren Do not skip meals but try to eat regularly

## 2021-03-13 NOTE — Progress Notes (Signed)
Provider:  Alain Honey, MD  Careteam: Patient Care Team: Wardell Honour, MD as PCP - General (Family Medicine) Warden Fillers, MD as Consulting Physician (Ophthalmology) Griselda Miner, MD as Consulting Physician (Dermatology)  PLACE OF SERVICE:  [place] Directive information    Allergies  Allergen Reactions   Penicillins Rash    Chief Complaint  Patient presents with   Acute Visit    Patient presents today for ingestion for 3 weeks now and seems to be improving. She reports taking Protonix for 20 years now and it seems to not help lately.     HPI: Patient is a 87 y.o. female.  3-week history of indigestion.  She has been taking Protonix twice daily.  She denies nausea or vomiting or change in stools.  She denies nighttime wakening.  Her diet has been sparse slightly and that is reflected on some weight loss since she was here a week ago.  She does report some improvement on twice daily dosing of Protonix.  She also questions whether it is worse when she takes her gabapentin  Review of Systems:  Review of Systems  Constitutional:  Positive for weight loss.  Respiratory: Negative.    Cardiovascular: Negative.   Gastrointestinal:  Positive for heartburn. Negative for blood in stool, constipation and diarrhea.  All other systems reviewed and are negative.  Past Medical History:  Diagnosis Date   Arthritis    Osteoarthritis   Depression    GERD (gastroesophageal reflux disease)    Hyperlipidemia    Osteopenia    Past Surgical History:  Procedure Laterality Date   APPENDECTOMY  1936   CERVICAL FUSION  2004   COLON SURGERY  06/12/2010   Colonoscopy   FINGER SURGERY  1989   MELANOMA EXCISION  11/16/2017   forehead   SPINE SURGERY  2009   Back Fusion   TONSILLECTOMY  1941   Social History:   reports that she has quit smoking. She has never used smokeless tobacco. She reports current alcohol use. She reports that she does not use  drugs.  History reviewed. No pertinent family history.  Medications: Patient's Medications  New Prescriptions   No medications on file  Previous Medications   BRIMONIDINE (ALPHAGAN) 0.15 % OPHTHALMIC SOLUTION    INT 1 GTT INTO OU BID   CHOLECALCIFEROL (VITAMIN D) 1000 UNITS TABLET    Take 1,000 Units by mouth 2 (two) times daily.    DICLOFENAC (VOLTAREN) 50 MG EC TABLET    TAKE 1 TABLET THREE TIMES DAILY WITH MEALS   DICLOFENAC SODIUM (VOLTAREN) 1 % GEL    Apply 2 g topically 4 (four) times daily.   DORZOLAMIDE-TIMOLOL (COSOPT) 22.3-6.8 MG/ML OPHTHALMIC SOLUTION    Place 1 drop into both eyes daily.   GABAPENTIN (NEURONTIN) 300 MG CAPSULE    One tablet in the am and two tablets at night   LATANOPROST (XALATAN) 0.005 % OPHTHALMIC SOLUTION    Place 1 drop into both eyes at bedtime.    LINACLOTIDE (LINZESS) 72 MCG CAPSULE    Take 1 capsule (72 mcg total) by mouth daily before breakfast.   LISINOPRIL (ZESTRIL) 20 MG TABLET    TAKE 1 TABLET EVERY DAY   MENAQUINONE-7 (VITAMIN K2 PO)    Take 1 capsule by mouth daily.   PANTOPRAZOLE (PROTONIX) 40 MG TABLET    TAKE 1 TABLET BY MOUTH TWICE A DAY   SERTRALINE (ZOLOFT) 100 MG TABLET    TAKE 1 TABLET ONE TIME  DAILY AT BEDTIME  Modified Medications   No medications on file  Discontinued Medications   FERROUS SULFATE (IRON PO)    Take 1 capsule by mouth daily.   HYDROCODONE-ACETAMINOPHEN (NORCO/VICODIN) 5-325 MG TABLET    Take 1 tablet by mouth 2 (two) times daily as needed for severe pain.   SIMVASTATIN (ZOCOR) 80 MG TABLET    TAKE 1 TABLET EVERY DAY    Physical Exam:  Vitals:   03/13/21 0950  BP: 114/68  Pulse: 64  Temp: (!) 97.1 F (36.2 C)  TempSrc: Temporal  SpO2: 98%  Weight: 111 lb (50.3 kg)  Height: '5\' 1"'$  (1.549 m)   Body mass index is 20.97 kg/m. Wt Readings from Last 3 Encounters:  03/13/21 111 lb (50.3 kg)  03/08/21 112 lb 6.4 oz (51 kg)  12/18/20 111 lb (50.3 kg)    Physical Exam Vitals and nursing note reviewed.   Constitutional:      Appearance: Normal appearance.  Cardiovascular:     Rate and Rhythm: Normal rate.     Heart sounds: Normal heart sounds.  Pulmonary:     Effort: Pulmonary effort is normal.  Abdominal:     General: Abdomen is flat. Bowel sounds are normal.     Palpations: Abdomen is soft.  Neurological:     Mental Status: She is alert.    Labs reviewed: Basic Metabolic Panel: Recent Labs    06/04/20 1402  NA 142  K 4.2  CL 108  CO2 26  GLUCOSE 88  BUN 19  CREATININE 0.65  CALCIUM 9.4   Liver Function Tests: Recent Labs    06/04/20 1402  AST 19  ALT 17  BILITOT 0.4  PROT 5.9*   No results for input(s): LIPASE, AMYLASE in the last 8760 hours. No results for input(s): AMMONIA in the last 8760 hours. CBC: Recent Labs    06/04/20 1402  WBC 5.7  NEUTROABS 3,352  HGB 12.8  HCT 39.3  MCV 89.1  PLT 199   Lipid Panel: Recent Labs    06/04/20 1402  CHOL 229*  HDL 53  LDLCALC 143*  TRIG 191*  CHOLHDL 4.3   TSH: No results for input(s): TSH in the last 8760 hours. A1C: Lab Results  Component Value Date   HGBA1C 5.6 06/04/2020     Assessment/Plan  2. GERD without esophagitis Since she has been on Protonix for some time I would like to discontinue that in favor of Prevacid 30 mg.  Also recommend Gaviscon to be used in conjunction.  Did give her a list of foods that are frequently associated with increased acid production if symptoms do not improve within 2 weeks will refer to gastroenterology for possible endoscopy evaluation  3. Recurrent major depressive disorder, in partial remission ([place]) She continues with sertraline.  She does not appear depressed clinically.   Alain Honey, MD [facility] [phone]

## 2021-03-26 ENCOUNTER — Telehealth: Payer: Self-pay | Admitting: *Deleted

## 2021-03-26 DIAGNOSIS — K219 Gastro-esophageal reflux disease without esophagitis: Secondary | ICD-10-CM

## 2021-03-26 NOTE — Telephone Encounter (Signed)
Patient called and stated that Dr. Sabra Heck wanted her to call back and let him know how the GERD medication prescribed was doing.   She stated that she is doing better on the medication. Her Heartburn/Indigestion is better. Stated that she is eating actual food now without hurting. Stated she feels like her old self again.   FYI

## 2021-03-28 ENCOUNTER — Encounter: Payer: Self-pay | Admitting: Gastroenterology

## 2021-03-28 NOTE — Telephone Encounter (Signed)
Patient called back and stated that last night at supper the pain is back, Indigestion. Stated that it has not been as bad.  Patient is wondering if she is needing to go the Gastroenterologist that you suggested.   Please Advise.

## 2021-03-28 NOTE — Telephone Encounter (Signed)
I have reviewed Dr Ammie Ferrier note and order placed for GI referral.

## 2021-03-28 NOTE — Telephone Encounter (Signed)
Patient notified and agreed.  

## 2021-04-02 ENCOUNTER — Telehealth: Payer: Self-pay

## 2021-04-02 NOTE — Telephone Encounter (Signed)
Patient called and states that [facility] will not be able to see her until September 13,2022 and she wanted to know if Dr. Sabra Heck could see about getting her in sooner. I called [facility] office and they stated that they no longer have any appointments sooner than October, 2022 now and if any cancellations they could call the patient for appointment. I called patient back and advised her of and she states that she may need to change her referral to [facility]  gastroenterology. Please advise.

## 2021-04-02 NOTE — Telephone Encounter (Signed)
Service coordinator w/ [facility] called to advise Dr. Sabra Heck that patient is losing a lot of weight and becoming more incontinent. She also wanted to advise Dr. Sabra Heck that someone with [facility] palliative care will be reaching out to him for an order for the patient.

## 2021-04-08 ENCOUNTER — Other Ambulatory Visit: Payer: Self-pay

## 2021-04-08 ENCOUNTER — Encounter: Payer: Self-pay | Admitting: Family

## 2021-04-08 ENCOUNTER — Telehealth (INDEPENDENT_AMBULATORY_CARE_PROVIDER_SITE_OTHER): Payer: Medicare HMO | Admitting: Family

## 2021-04-08 ENCOUNTER — Telehealth: Payer: Self-pay | Admitting: *Deleted

## 2021-04-08 DIAGNOSIS — F3341 Major depressive disorder, recurrent, in partial remission: Secondary | ICD-10-CM | POA: Diagnosis not present

## 2021-04-08 DIAGNOSIS — F411 Generalized anxiety disorder: Secondary | ICD-10-CM | POA: Diagnosis not present

## 2021-04-08 MED ORDER — SERTRALINE HCL 100 MG PO TABS: 150.0000 mg | ORAL_TABLET | Freq: Every day | ORAL | 3 refills | Status: DC

## 2021-04-08 NOTE — Telephone Encounter (Signed)
Denise with [facility] is calling stated that patient is requesting Palliative Care Services.  Nurse is wanting  a verbal order to evaluate and treat.   Is this ok Please Advise.   Langley Gauss [phone] option 2

## 2021-04-08 NOTE — Patient Instructions (Signed)
Increase sertraline from 100 mg Tablet to 150 mg tablet daily

## 2021-04-08 NOTE — Progress Notes (Signed)
This service is provided via telemedicine  No vital signs collected/recorded due to the encounter was a telemedicine visit.   Location of patient (ex: home, work):  Home.  Patient consents to a telephone visit:  Yes  Location of the provider (ex: office, home):  [employer].   Name of any referring provider:  Wardell Honour, MD   Names of all persons participating in the telemedicine service and their role in the encounter:  Patient, Heriberto Antigua, [place], [place], Webb Silversmith, NP.    Time spent on call:  8 minutes spent on the phone with Medical Assistant.        Provider: Lorence Nagengast FNP-C  Wardell Honour, MD  Patient Care Team: Wardell Honour, MD as PCP - General (Family Medicine) Warden Fillers, MD as Consulting Physician (Ophthalmology) Griselda Miner, MD as Consulting Physician (Dermatology)  Extended Emergency Contact Information Primary Emergency Contact: [facility] Phone: [phone] Relation: Son  Code Status:  DNR Goals of care: Advanced Directive information Advanced Directives 04/08/2021  Does Patient Have a Medical Advance Directive? Yes  Type of Advance Directive [place]  Does patient want to make changes to medical advance directive? No - Patient declined  Copy of [place] in Chart? Yes - validated most recent copy scanned in chart (See row information)  Would patient like information on creating a medical advance directive? -  Pre-existing out of facility DNR order (yellow form or pink MOST form) -     Chief Complaint  Patient presents with   Acute Visit    Complains of incontinence. Wants to increase Zoloft.    HPI:  Pt is a 87 y.o. female seen today for an acute visit for evaluation of depression and anxiety.she would like her Zoloft medication adjusted.states since her daughter died she still feeling depressed.she denies any suicide ideation.has not seen any counselor  states cannot afford to pay.   Had a bad indigestion one month ago.she called GI and was given an appointment for [email or web address] had prescribed Prevacid which has helped.states took Protonix for a long time but no longer taking it.   Needs someone to help her clean the house. She resides in an [place].States Education officer, museum at the facility told her PCP can order [facility] for her to come and assist with her ADL's.  She denies any terminal illness or cancer.States appetite is good.Has had no weight loss.I've discussed with her that [facility] has specific qualification that are needed in order to qualify for hospice not just assistance with ADL's.Recommended Home health for Nurse Aide but states unable to pay.  No recent fall episode.    Past Medical History:  Diagnosis Date   Arthritis    Osteoarthritis   Depression    GERD (gastroesophageal reflux disease)    Hyperlipidemia    Osteopenia    Past Surgical History:  Procedure Laterality Date   APPENDECTOMY  1936   CERVICAL FUSION  2004   COLON SURGERY  06/12/2010   Colonoscopy   FINGER SURGERY  1989   MELANOMA EXCISION  11/16/2017   forehead   SPINE SURGERY  2009   Back Fusion   TONSILLECTOMY  1941    Allergies  Allergen Reactions   Penicillins Rash    Outpatient Encounter Medications as of 04/08/2021  Medication Sig   brimonidine (ALPHAGAN) 0.15 % ophthalmic solution INT 1 GTT INTO OU BID   cholecalciferol (VITAMIN D) 1000 UNITS tablet Take 1,000  Units by mouth 2 (two) times daily.    diclofenac (VOLTAREN) 50 MG EC tablet TAKE 1 TABLET THREE TIMES DAILY WITH MEALS   diclofenac sodium (VOLTAREN) 1 % GEL Apply 2 g topically 4 (four) times daily.   dorzolamide-timolol (COSOPT) 22.3-6.8 MG/ML ophthalmic solution Place 1 drop into both eyes daily.   gabapentin (NEURONTIN) 300 MG capsule One tablet in the am and two tablets at night   lansoprazole (PREVACID) 30 MG capsule Take 1 capsule (30 mg  total) by mouth daily at 12 noon.   latanoprost (XALATAN) 0.005 % ophthalmic solution Place 1 drop into both eyes at bedtime.    linaclotide (LINZESS) 72 MCG capsule Take 1 capsule (72 mcg total) by mouth daily before breakfast.   lisinopril (ZESTRIL) 20 MG tablet TAKE 1 TABLET EVERY DAY   Menaquinone-7 (VITAMIN K2 PO) Take 1 capsule by mouth daily.   pantoprazole (PROTONIX) 40 MG tablet TAKE 1 TABLET BY MOUTH TWICE A DAY   sertraline (ZOLOFT) 100 MG tablet TAKE 1 TABLET ONE TIME DAILY AT BEDTIME   No facility-administered encounter medications on file as of 04/08/2021.    Review of Systems  Constitutional:  Negative for appetite change, chills, fatigue, fever and unexpected weight change.  HENT:  Negative for congestion, dental problem, ear discharge, ear pain, facial swelling, hearing loss, nosebleeds, postnasal drip, rhinorrhea, sinus pressure, sinus pain, sneezing, sore throat and tinnitus.   Eyes:  Negative for pain, discharge, redness and itching.  Respiratory:  Negative for cough, chest tightness, shortness of breath and wheezing.   Cardiovascular:  Negative for chest pain, palpitations and leg swelling.  Gastrointestinal:  Negative for abdominal distention, abdominal pain, diarrhea, nausea and vomiting.  Genitourinary:  Negative for difficulty urinating, dysuria, flank pain, frequency and urgency.  Musculoskeletal:  Positive for arthralgias, back pain and gait problem. Negative for joint swelling, myalgias, neck pain and neck stiffness.  Skin:  Negative for color change, pallor, rash and wound.  Neurological:  Negative for dizziness, syncope, speech difficulty, weakness, light-headedness, numbness and headaches.  Hematological:  Does not bruise/bleed easily.  Psychiatric/Behavioral:  Negative for agitation, behavioral problems, confusion, hallucinations, sleep disturbance and suicidal ideas. The patient is nervous/anxious.    Immunization History  Administered Date(s) Administered    DTaP 08/19/2007   Fluad Quad(high Dose 65+) 05/24/2019, 06/04/2020   Influenza, High Dose Seasonal PF 05/18/2017   Influenza,inj,Quad PF,6+ Mos 05/31/2013, 05/23/2014, 05/04/2015, 05/04/2015, 05/01/2016, 04/01/2018   Influenza-Unspecified 05/18/2010, 06/10/2011, 06/09/2012   Moderna SARS-COV2 Booster Vaccination 10/03/2020   Moderna Sars-Covid-2 Vaccination 10/11/2019, 11/17/2019   Pneumococcal Conjugate-13 01/22/2015   Pneumococcal Polysaccharide-23 08/19/2007   Tdap 03/03/2014   Zoster, Live 03/18/2013   Pertinent  Health Maintenance Due  Topic Date Due   HEMOGLOBIN A1C  12/03/2020   INFLUENZA VACCINE  03/18/2021   FOOT EXAM  09/28/2021 (Originally 02/12/1937)   OPHTHALMOLOGY EXAM  09/25/2021   DEXA SCAN  Completed   PNA vac Low Risk Adult  Completed   Fall Risk  04/08/2021 03/13/2021 03/08/2021 12/18/2020 09/28/2020  Falls in the past year? 1 0 0 1 1  Number falls in past yr: 0 0 0 1 1  Injury with Fall? 0 0 0 1 1  Comment - - - Broke right hand -  Risk for fall due to : History of fall(s) History of fall(s) History of fall(s) History of fall(s);Impaired balance/gait -  Follow up Falls evaluation completed Falls evaluation completed;Education provided;Falls prevention discussed Falls evaluation completed;Education provided;Falls prevention discussed - -  Functional Status Survey:    There were no vitals filed for this visit. There is no height or weight on file to calculate BMI. Physical Exam  Unable to complete on the phone Labs reviewed: Recent Labs    06/04/20 1402  NA 142  K 4.2  CL 108  CO2 26  GLUCOSE 88  BUN 19  CREATININE 0.65  CALCIUM 9.4   Recent Labs    06/04/20 1402  AST 19  ALT 17  BILITOT 0.4  PROT 5.9*   Recent Labs    06/04/20 1402  WBC 5.7  NEUTROABS 3,352  HGB 12.8  HCT 39.3  MCV 89.1  PLT 199   Lab Results  Component Value Date   TSH 0.32 (L) 07/25/2016   Lab Results  Component Value Date   HGBA1C 5.6 06/04/2020   Lab Results   Component Value Date   CHOL 229 (H) 06/04/2020   HDL 53 06/04/2020   LDLCALC 143 (H) 06/04/2020   TRIG 191 (H) 06/04/2020   CHOLHDL 4.3 06/04/2020    Significant Diagnostic Results in last 30 days:  No results found.  Assessment/Plan 1. Generalized anxiety disorder Worsening symptoms request sertraline Increase sertraline from 100 mg to 150 mg tablet daily    2. Recurrent major depressive disorder, in partial remission ([place]) No suicidal ideation or injury to self or others  Increase sertraline from 100 mg to 150 mg tablet daily   - follow up in 4 weeks for evaluation   Family/ staff Communication: Reviewed plan of care with patient verbalized understanding   Labs/tests ordered: None   Next Appointment: 1 month for evaluation of depression   I connected with  [name] on 04/08/21 by a telephone enabled telemedicine application and verified that I am speaking with the correct person using two identifiers.   I discussed the limitations of evaluation and management by telemedicine. The patient expressed understanding and agreed to proceed.  Spent 12 minutes of non-face to face with patient     Sandrea Hughs, NP

## 2021-04-09 NOTE — Telephone Encounter (Signed)
Per Dr.Miller sent via routing comment:  Wardell Honour, MD  Rafael Bihari A, CMA 38 minutes ago (8:32 AM)   Faythe Ghee for Palliative consult   I called Langley Gauss back and relayed the above message

## 2021-04-15 ENCOUNTER — Telehealth: Payer: Self-pay

## 2021-04-15 NOTE — Telephone Encounter (Signed)
[name], Theme park manager at [facility] [employer], called on behalf of patient. She is wanting a statement faxed stating that has to buy boost for re evaluation of rent.    FaxOtila K[employer] [phone] Manager at [facility]

## 2021-04-16 ENCOUNTER — Other Ambulatory Visit: Payer: Self-pay | Admitting: Hospice

## 2021-04-16 ENCOUNTER — Other Ambulatory Visit: Payer: Self-pay

## 2021-04-16 DIAGNOSIS — W19XXXD Unspecified fall, subsequent encounter: Secondary | ICD-10-CM

## 2021-04-16 DIAGNOSIS — M159 Polyosteoarthritis, unspecified: Secondary | ICD-10-CM

## 2021-04-16 DIAGNOSIS — R634 Abnormal weight loss: Secondary | ICD-10-CM | POA: Diagnosis not present

## 2021-04-16 DIAGNOSIS — R2681 Unsteadiness on feet: Secondary | ICD-10-CM | POA: Diagnosis not present

## 2021-04-16 DIAGNOSIS — M519 Unspecified thoracic, thoracolumbar and lumbosacral intervertebral disc disorder: Secondary | ICD-10-CM | POA: Diagnosis not present

## 2021-04-16 DIAGNOSIS — Z515 Encounter for palliative care: Secondary | ICD-10-CM

## 2021-04-16 NOTE — Telephone Encounter (Addendum)
Could you please type this out on letter head to be faxed. Thank you  It needs to state that patient has to buy boost

## 2021-04-16 NOTE — Progress Notes (Signed)
Therapist, nutritional Palliative Care Consult Note Telephone: [phone]  Fax: [phone]  PATIENT NAME: [name] [address] [name] [address] [place] [address] [phone] (home)  DOB: 09-Dec-1926 MRN: [id number]  PRIMARY CARE PROVIDER:    Frederica Kuster, MD,  [address] [place] [address] [phone]  REFERRING PROVIDER:   Frederica Kuster, MD [address] [place],  [place] [address] [phone]  RESPONSIBLE PARTY:   Self Contact Information     Name Relation Home Work Mobile   [name]   [phone]      I met face to face with patient at home . Palliative Care was asked to follow this patient by consultation request of  Christine Kuster, MD to address advance care planning, complex medical decision making and goals of care clarification.  At the request of patient, this service coordinator at [name] was present at the visit.  This is the initial visit.    ASSESSMENT AND / RECOMMENDATIONS:   Advance Care Planning: Our advance care planning conversation included a discussion about:    The value and importance of advance care planning  Difference between Hospice and Palliative care Exploration of goals of care in the event of a sudden injury or illness  Identification and preparation of a healthcare agent  Review and updating or creation of an  advance directive document . Decision not to resuscitate or to de-escalate disease focused treatments due to poor prognosis.  CODE STATUS: Patient affirms she is a Do Not Resuscitate.  NP signed DNR form for patient to keep at home,. same document uploaded to epic today  Goals of Care: Goals include to maximize quality of life and symptom management  Patient recently lost her daughter and said she still grieves  I spent  46 minutes providing this initial consultation. More than 50% of the time in this consultation was spent on counseling patient and coordinating  communication. --------------------------------------------------------------------------------------------------------------------------------------  Symptom Management/Plan: Weight loss: Down from 135 Ibs about a year ago to currently 107Ibs. Continue with Boost TID. 4-6 small daily.  Follow up  visit with PCP Sept 2022. Routine CBC CMP Depression: Continue Zoloft as ordered.  Psych consult as needed. GERD: She said she has stopped gabapentin because it always aggravated her GERD symptoms. GERD currently managed with Lansoprazole. Endoscopy scheduled for next month, per patient.  Fall: Recent fall 3 weeks ago with no injuries, related to gait disturbance. Patient will benefit from PT/OT and Home Health Aid for assistance in ADLs.  Discussed Fall precautions; encouraged use of rolling walker to provide support and help prevent fall.  Low Back pain: with no sciatica at this She said she is no longer on oxycodone. She is well managed with Diclofenac   Follow up: Palliative care will continue to follow for complex medical decision making, advance care planning, and clarification of goals. Return 6 weeks or prn.Encouraged to call provider sooner with any concerns.   Family/Caregiver/Community Supports: Patient lives in [place]; her son [name] lives in [place]. She participates in health and wellness programs and other socialization activities per, per [name] the Company secretary.   HOSPICE ELIGIBILITY/DIAGNOSIS: TBD  Chief Complaint: Initial Palliative care visit  HISTORY OF PRESENT ILLNESS:  [name] is a 87 y.o. year old female  with multiple medical conditions including chronic weight loss,, recently worsening currently at 107 pounds.  Patient reports ongoing weight loss affects her gait and makes her weak resulting  in recent fall.  She recently started on boost and thinks that this is helpful.  She denies pain/discomfort, no respiratory distress.  History of arthritis, low back pain,  hypertension, GERD.  History obtained from review of EMR, discussion with primary team, caregiver, family and/or [name].  Review and summarization of Epic records shows history from other than patient. Rest of 10 point ROS asked and negative.     Review of lab tests/diagnostics    Results for Christine, Carter (MRN [id number]) as of 04/16/2021 10:26  Ref. Range 06/04/2020 14:02  Sodium Latest Ref Range: 135 - 146 mmol/L 142  Potassium Latest Ref Range: 3.5 - 5.3 mmol/L 4.2  Chloride Latest Ref Range: 98 - 110 mmol/L 108  CO2 Latest Ref Range: 20 - 32 mmol/L 26  Glucose Latest Ref Range: 65 - 99 mg/dL 88  Mean Plasma Glucose Latest Units: (calc) 114  BUN Latest Ref Range: 7 - 25 mg/dL 19  Creatinine Latest Ref Range: 0.60 - 0.88 mg/dL 0.65  Calcium Latest Ref Range: 8.6 - 10.4 mg/dL 9.4  BUN/Creatinine Ratio Latest Ref Range: 6 - 22 (calc) NOT APPLICABLE  AG Ratio Latest Ref Range: 1.0 - 2.5 (calc) 1.8  AST Latest Ref Range: 10 - 35 U/L 19  ALT Latest Ref Range: 6 - 29 U/L 17  Total Protein Latest Ref Range: 6.1 - 8.1 g/dL 5.9 (L)   ROS General: NAD EYES: denies vision changes ENMT: denies dysphagia Cardiovascular: denies chest pain/discomfort Pulmonary: denies cough, denies SOB Abdomen: endorses good appetite, denies constipation/diarrhea GU: denies dysuria, urinary frequency MSK:  endorses weakness, reported fall with no injuries 3 weeks ago Skin: denies rashes or wounds Neurological: denies pain, denies insomnia Psych: Endorses positive mood Heme/lymph/immuno: denies bruises, abnormal bleeding  Physical Exam: Height/Weight: 5 feet/107 Ibs Constitutional: NAD General: Well groomed, cooperative EYES: anicteric sclera, lids intact, no discharge  ENMT: Moist mucous membrane CV: S1 S2, RRR, no LE edema Pulmonary: LCTA, no increased work of breathing, no cough, Abdomen: active BS + 4 quadrants, soft and non tender GU: no suprapubic tenderness MSK: weakness, sarcopenia,  limited ROM, unsteady gait Skin: warm and dry, no rashes or wounds on visible skin Neuro:  weakness, otherwise non focal Psych: non-anxious affect Hem/lymph/immuno: no widespread bruising   PAST MEDICAL HISTORY:  Active Ambulatory Problems    Diagnosis Date Noted   Hyperglycemia 11/18/2012   Hypertriglyceridemia, essential 11/18/2012   Essential hypertension, benign 11/18/2012   Peripheral neuropathy 11/18/2012   Chronic venous insufficiency 07/26/2015   Urge incontinence of urine 07/26/2015   Glaucoma 07/26/2015   Postmenopausal estrogen deficiency 07/26/2015   Primary open angle glaucoma 01/02/2014   Malignant melanoma of left temple ([place]) 11/26/2017   Need for influenza vaccination 04/01/2018   Hyperlipidemia associated with type 2 diabetes mellitus ([place]) 10/11/2018   Recurrent major depressive disorder, in partial remission ([place]) 03/24/2019   Generalized osteoarthritis of multiple sites 03/24/2019   Moderate episode of recurrent major depressive disorder ([place]) 08/15/2019   Generalized anxiety disorder 08/15/2019   GERD without esophagitis 03/08/2021   Chronic idiopathic constipation 03/08/2021   Resolved Ambulatory Problems    Diagnosis Date Noted   Hyperlipidemia LDL goal < 100 11/18/2012   Rheumatoid arthritis involving multiple sites ([place]) 07/26/2015   Hypertriglyceridemia 07/26/2015   Past Medical History:  Diagnosis Date   Arthritis    Depression    GERD (gastroesophageal reflux disease)    Hyperlipidemia    Osteopenia     SOCIAL HX:  Social History  Tobacco Use   Smoking status: Former   Smokeless tobacco: Never   Tobacco comments:    Quit at age 30   Substance Use Topics   Alcohol use: Yes    Alcohol/week: 0.0 standard drinks    Comment: Occasionally      FAMILY HX:  Mother: Rheumatic heart disease Father: TB  ALLERGIES:  Allergies  Allergen Reactions   Penicillins Rash      PERTINENT MEDICATIONS:  Outpatient Encounter Medications as of  04/16/2021  Medication Sig   brimonidine (ALPHAGAN) 0.15 % ophthalmic solution INT 1 GTT INTO OU BID   cholecalciferol (VITAMIN D) 1000 UNITS tablet Take 1,000 Units by mouth 2 (two) times daily.    diclofenac (VOLTAREN) 50 MG EC tablet TAKE 1 TABLET THREE TIMES DAILY WITH MEALS   diclofenac sodium (VOLTAREN) 1 % GEL Apply 2 g topically 4 (four) times daily.   dorzolamide-timolol (COSOPT) 22.3-6.8 MG/ML ophthalmic solution Place 1 drop into both eyes daily.   lansoprazole (PREVACID) 30 MG capsule Take 1 capsule (30 mg total) by mouth daily at 12 noon.   latanoprost (XALATAN) 0.005 % ophthalmic solution Place 1 drop into both eyes at bedtime.    linaclotide (LINZESS) 72 MCG capsule Take 1 capsule (72 mcg total) by mouth daily before breakfast.   lisinopril (ZESTRIL) 20 MG tablet TAKE 1 TABLET EVERY DAY   Menaquinone-7 (VITAMIN K2 PO) Take 1 capsule by mouth daily.   sertraline (ZOLOFT) 100 MG tablet Take 1.5 tablets (150 mg total) by mouth daily.   No facility-administered encounter medications on file as of 04/16/2021.     Thank you for the opportunity to participate in the care of Ms. Schrager.  The palliative care team will continue to follow. Please call our office at [phone] if we can be of additional assistance.   Note: Portions of this note were generated with Lobbyist. Dictation errors may occur despite best attempts at proofreading.  Teodoro Spray, NP

## 2021-04-16 NOTE — Telephone Encounter (Signed)
Letter Faxed

## 2021-04-23 ENCOUNTER — Telehealth: Payer: Self-pay | Admitting: *Deleted

## 2021-04-23 NOTE — Telephone Encounter (Signed)
Patient called and stated that she has not had a bowel movement in about 3-4 days now. Stated that she has taken Linzess and Colace with no results. Stated that she feels like she needs to go but then there is nothing. Stated that she is feeling miserable.   Wants to know what she should do.  Please Advise.

## 2021-04-23 NOTE — Telephone Encounter (Signed)
Christine Honour, MD  You 7 minutes ago (12:40 PM)   Could try Fleets enema      Patient Notified and agreed.

## 2021-04-30 ENCOUNTER — Ambulatory Visit: Payer: Medicare Other | Admitting: Gastroenterology

## 2021-04-30 ENCOUNTER — Other Ambulatory Visit: Payer: Self-pay | Admitting: *Deleted

## 2021-04-30 DIAGNOSIS — M159 Polyosteoarthritis, unspecified: Secondary | ICD-10-CM

## 2021-05-02 ENCOUNTER — Telehealth: Payer: Self-pay

## 2021-05-02 NOTE — Telephone Encounter (Signed)
   Telephone encounter was:  Unsuccessful.  05/02/2021 Name: [name] MRN: [id number] DOB: 06/09/1927  Unsuccessful outbound call made today to assist with:  Transportation Needs   Outreach Attempt:  1st Attempt  A HIPAA compliant voice message was left requesting a return call.  Instructed patient to call back at [phone] at their earliest convenience.   [place] management  [place], [place]  Main Phone: [phone]  E-mail: [email or web address]  Website: [email or web address]

## 2021-05-03 ENCOUNTER — Telehealth: Payer: Self-pay

## 2021-05-03 NOTE — Telephone Encounter (Signed)
   Telephone encounter was:  Unsuccessful.  05/03/2021 Name: [name] MRN: [id number] DOB: [birth year]  Unsuccessful outbound call made today to assist with:  Transportation Needs   Outreach Attempt:  2nd Attempt  A HIPAA compliant voice message was left requesting a return call.  Instructed patient to call back at [phone] at their earliest convenience.  [place] management  [place], [place]  Main Phone: [phone]  E-mail: [email or web address]  Website: [email or web address]

## 2021-05-06 ENCOUNTER — Telehealth: Payer: Self-pay | Admitting: *Deleted

## 2021-05-06 ENCOUNTER — Telehealth: Payer: Self-pay

## 2021-05-06 ENCOUNTER — Other Ambulatory Visit: Payer: Self-pay | Admitting: Family Medicine

## 2021-05-06 NOTE — Telephone Encounter (Signed)
   Telephone encounter was:  Successful.  05/06/2021 Name: [name] MRN: [id number] DOB: 1926/12/20  [name] is a 87 y.o. year old female who is a primary care patient of Wardell Honour, MD . The community resource team was consulted for assistance with Transportation Needs   Care guide performed the following interventions:  Finally reached pt regarding rides. She advised [employer] Medicare was not reliable and [facility] transportation picks up too many pts at one time, therefore, she was late to her appt. She has also used [employer]. I will reach out to them. Her appt is on 9/27.  Follow Up Plan:  Care guide will follow up with patient by phone over the next few days to ensure transportation is scheduled.  [place] management  [place], [place]  Main Phone: [phone]  E-mail: [email or web address]  Website: [email or web address]

## 2021-05-06 NOTE — Telephone Encounter (Signed)
Patient need a letter stating that she uses and pays for these Items to get discount on her rent a month:  -Incontinence Pads -Depends -Tums -Polydent -Fixadent -Halls -Advil -Vitamin D -Vitamin K -Probiotic - Dulcolax/Miralax -Gaviscon -Boost  Pended letter and sent to Dr. Sabra Heck for approval.   To be faxed to [facility] with Leroy Sea once completed to: [phone]

## 2021-05-07 ENCOUNTER — Encounter: Payer: Self-pay | Admitting: Family Medicine

## 2021-05-08 ENCOUNTER — Telehealth: Payer: Self-pay

## 2021-05-08 ENCOUNTER — Other Ambulatory Visit: Payer: Self-pay | Admitting: *Deleted

## 2021-05-08 MED ORDER — SERTRALINE HCL 100 MG PO TABS: 150.0000 mg | ORAL_TABLET | Freq: Every day | ORAL | 1 refills | Status: DC

## 2021-05-08 NOTE — Telephone Encounter (Signed)
Pharmacy requested refill.  Pended Rx and sent to Dr. Miller for approval due to HIGH ALERT Warning.  

## 2021-05-08 NOTE — Telephone Encounter (Signed)
   Telephone encounter was:  Successful.  05/08/2021 Name: [name] MRN: [id number] DOB: [birth year]  [name] is a 87 y.o. year old female who is a primary care patient of Wardell Honour, MD . The community resource team was consulted for assistance with Transportation Needs   Care guide performed the following interventions:  Gerlene Fee from [employer] called in to advise he received the Transportation for patient and he will advise on Friday if Transportation was able to be arranged or not for patient's appt. on 9/27.  Follow Up Plan:   Contact patient by 3:00pm on Friday.  [place] management  [place], [place]  Main Phone: [phone]  E-mail: [email or web address]  Website: [email or web address]

## 2021-05-08 NOTE — Telephone Encounter (Signed)
Letter faxed.

## 2021-05-09 ENCOUNTER — Telehealth: Payer: Medicare HMO | Admitting: Family

## 2021-05-09 ENCOUNTER — Telehealth: Payer: Self-pay

## 2021-05-09 MED ORDER — LINACLOTIDE 72 MCG PO CAPS: 72.0000 ug | ORAL_CAPSULE | Freq: Every day | ORAL | 0 refills | Status: DC

## 2021-05-09 NOTE — Telephone Encounter (Signed)
Patient confirmed she prefers [facility] over local pharmacy.

## 2021-05-09 NOTE — Telephone Encounter (Signed)
Patient states she only wants a 30 day supply of Linzess sent to [facility] because she isnt sure how much longer she is going to remain on it. She has 1 pill remaining and an appointment on 9/27.   Called and LMOM to confirm the pharmacy since [facility] is mail order.

## 2021-05-14 ENCOUNTER — Telehealth: Payer: Self-pay

## 2021-05-14 ENCOUNTER — Other Ambulatory Visit: Payer: Self-pay

## 2021-05-14 ENCOUNTER — Encounter: Payer: Self-pay | Admitting: Family Medicine

## 2021-05-14 ENCOUNTER — Ambulatory Visit (INDEPENDENT_AMBULATORY_CARE_PROVIDER_SITE_OTHER): Payer: Medicare HMO | Admitting: Family Medicine

## 2021-05-14 VITALS — BP 118/70 | HR 90 | Temp 96.8°F | Ht 61.0 in | Wt 105.8 lb

## 2021-05-14 DIAGNOSIS — I10 Essential (primary) hypertension: Secondary | ICD-10-CM

## 2021-05-14 DIAGNOSIS — K5904 Chronic idiopathic constipation: Secondary | ICD-10-CM

## 2021-05-14 DIAGNOSIS — F331 Major depressive disorder, recurrent, moderate: Secondary | ICD-10-CM

## 2021-05-14 NOTE — Progress Notes (Signed)
Provider:  Alain Honey, MD  Careteam: Patient Care Team: Wardell Honour, MD as PCP - General (Family Medicine) Warden Fillers, MD as Consulting Physician (Ophthalmology) Griselda Miner, MD as Consulting Physician (Dermatology)  PLACE OF SERVICE:  [facility] Directive information    Allergies  Allergen Reactions   Penicillins Rash    Chief Complaint  Patient presents with   Medical Management of Chronic Issues    Patient presents today for a 1 month follow-up for depression. At last visit Marlowe Sax, NP increased Sertraline from 100 mg to 150 mg tablets daily.   Quality Metric Gaps    A1C, zoster, flu and Covid booster vaccines     HPI: Patient is a 87 y.o. female she had a telephone visit about 1 month ago and her sertraline was increased from 100 to 150 mg.  She reports that she is sleeping better.  She ask about using Xanax.  I tried to explain that that is more for anxiety and her problem seems to be more depression that started with loss of her daughter.  Review of Systems:  Review of Systems  Constitutional:  Positive for weight loss.  HENT: Negative.    Respiratory: Negative.    Cardiovascular: Negative.   Neurological: Negative.   Psychiatric/Behavioral:  Positive for depression.   All other systems reviewed and are negative.  Past Medical History:  Diagnosis Date   Arthritis    Osteoarthritis   Depression    GERD (gastroesophageal reflux disease)    Hyperlipidemia    Osteopenia    Past Surgical History:  Procedure Laterality Date   APPENDECTOMY  1936   CERVICAL FUSION  2004   COLON SURGERY  06/12/2010   Colonoscopy   FINGER SURGERY  1989   MELANOMA EXCISION  11/16/2017   forehead   SPINE SURGERY  2009   Back Fusion   TONSILLECTOMY  1941   Social History:   reports that she has quit smoking. She has never used smokeless tobacco. She reports current alcohol use. She reports that she does not use drugs.  History  reviewed. No pertinent family history.  Medications: Patient's Medications  New Prescriptions   No medications on file  Previous Medications   BRIMONIDINE (ALPHAGAN) 0.15 % OPHTHALMIC SOLUTION    INT 1 GTT INTO OU BID   CHOLECALCIFEROL (VITAMIN D) 1000 UNITS TABLET    Take 1,000 Units by mouth 2 (two) times daily.    DICLOFENAC (VOLTAREN) 50 MG EC TABLET    TAKE 1 TABLET THREE TIMES DAILY WITH MEALS   DICLOFENAC SODIUM (VOLTAREN) 1 % GEL    Apply 2 g topically 4 (four) times daily.   DORZOLAMIDE-TIMOLOL (COSOPT) 22.3-6.8 MG/ML OPHTHALMIC SOLUTION    Place 1 drop into both eyes daily.   LANSOPRAZOLE (PREVACID) 30 MG CAPSULE    TAKE 1 CAPSULE (30 MG TOTAL) BY MOUTH DAILY AT 12 NOON.   LATANOPROST (XALATAN) 0.005 % OPHTHALMIC SOLUTION    Place 1 drop into both eyes at bedtime.    LINACLOTIDE (LINZESS) 72 MCG CAPSULE    Take 1 capsule (72 mcg total) by mouth daily before breakfast.   LISINOPRIL (ZESTRIL) 20 MG TABLET    TAKE 1 TABLET EVERY DAY   MENAQUINONE-7 (VITAMIN K2 PO)    Take 1 capsule by mouth daily.   SERTRALINE (ZOLOFT) 100 MG TABLET    Take 1.5 tablets (150 mg total) by mouth daily.  Modified Medications   No medications on file  Discontinued  Medications   No medications on file    Physical Exam:  Vitals:   05/14/21 1057  BP: 118/70  Pulse: 90  Temp: (!) 96.8 F (36 C)  SpO2: 96%  Weight: 105 lb 12.8 oz (48 kg)  Height: 5\' 1"  (1.549 m)   Body mass index is 19.99 kg/m. Wt Readings from Last 3 Encounters:  05/14/21 105 lb 12.8 oz (48 kg)  03/13/21 111 lb (50.3 kg)  03/08/21 112 lb 6.4 oz (51 kg)    Physical Exam Vitals and nursing note reviewed.  Constitutional:      Appearance: Normal appearance.  Cardiovascular:     Rate and Rhythm: Normal rate and regular rhythm.  Pulmonary:     Effort: Pulmonary effort is normal.     Breath sounds: Normal breath sounds.  Abdominal:     General: Abdomen is flat. Bowel sounds are normal.     Palpations: Abdomen is soft.   Neurological:     General: No focal deficit present.     Mental Status: She is alert and oriented to person, place, and time.  Psychiatric:        Mood and Affect: Mood normal.        Behavior: Behavior normal.    Labs reviewed: Basic Metabolic Panel: Recent Labs    06/04/20 1402  NA 142  K 4.2  CL 108  CO2 26  GLUCOSE 88  BUN 19  CREATININE 0.65  CALCIUM 9.4   Liver Function Tests: Recent Labs    06/04/20 1402  AST 19  ALT 17  BILITOT 0.4  PROT 5.9*   No results for input(s): LIPASE, AMYLASE in the last 8760 hours. No results for input(s): AMMONIA in the last 8760 hours. CBC: Recent Labs    06/04/20 1402  WBC 5.7  NEUTROABS 3,352  HGB 12.8  HCT 39.3  MCV 89.1  PLT 199   Lipid Panel: Recent Labs    06/04/20 1402  CHOL 229*  HDL 53  LDLCALC 143*  TRIG 191*  CHOLHDL 4.3   TSH: No results for input(s): TSH in the last 8760 hours. A1C: Lab Results  Component Value Date   HGBA1C 5.6 06/04/2020     Assessment/Plan  1. Chronic idiopathic constipation Taking Linzess and problem is being treated appropriately  2. Essential hypertension, benign Continues to take lisinopril blood pressure 118/70  3. Moderate episode of recurrent major depressive disorder ([place]) Continue with same dose of sertraline.  Patient seems improved on increased dose   Alain Honey, MD [place] [phone]

## 2021-05-14 NOTE — Telephone Encounter (Signed)
   Telephone encounter was:  Successful.  05/14/2021 Name: [name] MRN: [id number] DOB: [birth year]  [name] is a 87 y.o. year old female who is a primary care patient of [facility] Lillette Boxer, MD . The community resource team was consulted for assistance with Transportation Needs   Care guide performed the following interventions:  Gerlene Fee from [employer] advised Transportation has been arranged as well, pt advised Transportation has been confirmed for 10:15a-10:30a pick up for today's 11a appointment. Per pt, at this time she does not need any other assistance.  Follow Up Plan:  No further follow up planned at this time. The patient has been provided with needed resources.  [facility] management  [place], [place]  Main Phone: [phone]  E-mail: [email or web address]  Website: [email or web address]

## 2021-05-14 NOTE — Telephone Encounter (Signed)
   Telephone encounter was:  Unsuccessful.  05/14/2021 Name: [name] MRN: [id number] DOB: 05/24/1927  Unsuccessful outbound call made today to assist with:  Transportation Needs   Outreach Attempt:  1st Attempt  A HIPAA compliant voice message was left requesting a return call.  Instructed patient to call back at [phone] at their earliest convenience.   [facility] management  [place], [place]  Main Phone: [phone]  E-mail: [email or web address]  Website: [email or web address]

## 2021-05-21 ENCOUNTER — Encounter: Payer: Self-pay | Admitting: Nurse Practitioner

## 2021-05-28 ENCOUNTER — Ambulatory Visit: Payer: Medicare Other | Admitting: Gastroenterology

## 2021-06-03 ENCOUNTER — Other Ambulatory Visit: Payer: Self-pay | Admitting: Hospice

## 2021-06-03 ENCOUNTER — Other Ambulatory Visit: Payer: Self-pay

## 2021-06-03 DIAGNOSIS — Z515 Encounter for palliative care: Secondary | ICD-10-CM | POA: Diagnosis not present

## 2021-06-03 DIAGNOSIS — E440 Moderate protein-calorie malnutrition: Secondary | ICD-10-CM | POA: Diagnosis not present

## 2021-06-03 DIAGNOSIS — F339 Major depressive disorder, recurrent, unspecified: Secondary | ICD-10-CM | POA: Diagnosis not present

## 2021-06-03 DIAGNOSIS — G8929 Other chronic pain: Secondary | ICD-10-CM

## 2021-06-03 DIAGNOSIS — M545 Low back pain, unspecified: Secondary | ICD-10-CM

## 2021-06-03 NOTE — Progress Notes (Signed)
[facility] Consult Note Telephone: [phone]  Fax: [phone]  PATIENT NAME: [name] DOB: 08/23/1926 MRN: [id number]  PRIMARY CARE PROVIDER:   Wardell Honour, MD Wardell Honour, [address] [place],  [place] [address]  REFERRING PROVIDER: Wardell Honour, MD Wardell Honour, [place],  [place] [address]  RESPONSIBLE PARTY:  Self - [facility] service coordinator [phone] office [phone] Contact Information     Name Relation Home Work Mobile   Devoiry, Corriher   [phone]       Visit is to build trust and highlight Palliative Medicine as specialized medical care for people living with serious illness, aimed at facilitating better quality of life through symptoms relief, assisting with advance care planning and complex medical decision making. [name] is present with patient during visit. This is a follow up visit.  RECOMMENDATIONS/PLAN:   Advance Care Planning/Code Status:Patient is a Do Not Resusucitate.  Goals of Care: Goals of care include to maximize quality of life and symptom management. Patient is interested in hospice service in the future when she qualifies for it.   Visit consisted of counseling and education dealing with the complex and emotionally intense issues of symptom management and palliative care in the setting of serious and potentially life-threatening illness. Palliative care team will continue to support patient, patient's family, and medical team.  Symptom management/Plan:  Protein Caloric Malnutrition. Weight is down from 135 Ibs about a year ago,  currently 102 Ibs from 107 Ibs. Last month. Continue with Boost BID,  4-6 small meals daily. She reports appetite is fair to good.  Depression: Continue Zoloft as ordered.  Psych consult as needed. GERD: Improved. There was no need for endoscopy as earlier scheduled. She Continues Lansoprazole. Low Back  pain: Well managed with Diclofenac    Follow up: Palliative care will continue to follow for complex medical decision making, advance care planning, and clarification of goals. Return 6 weeks or prn.Encouraged to call provider sooner with any concerns.    Family/Caregiver/Community Supports: Patient lives in [place]; her son [name] lives in [place]. She participates in health and wellness programs and other socialization activities per, per Loletha Carrow the Theme park manager.    HOSPICE ELIGIBILITY/DIAGNOSIS: TBD   Chief Complaint: Initial Palliative care visit   HISTORY OF PRESENT ILLNESS:  [name] is a 87 y.o. year old female  with multiple medical conditions including Protein caloric malnutrition with  chronic weight loss. History of  arthritis, low back pain, hypertension, GERD. She denies pain/discomfort, no respiratory distress. She endorses fair to good appetite, no depressive mood. History obtained from review of EMR, discussion with primary team, family and/or patient. Records reviewed and summarized above. All 10 point systems reviewed and are negative except as documented in history of present illness above  Review and summarization of Epic records shows history from other than patient.   Palliative Care was asked to follow this patient o help address complex decision making in the context of advance care planning and goals of care clarification.   PHYSICAL EXAM  Height/Weight: 5 feet/102 Ibs Constitutional: NAD General: Well groomed, cooperative EYES: anicteric sclera, lids intact, no discharge  ENMT: Moist mucous membrane CV: S1 S2, RRR, no LE edema Pulmonary: LCTA, no increased work of breathing, no cough, Abdomen: active BS + 4 quadrants, soft and non tender GU: no suprapubic tenderness MSK: weakness, sarcopenia, limited  ROM, unsteady gait Skin: warm and dry, no rashes or wounds on visible skin Neuro:  weakness, otherwise non focal Psych: non-anxious  affect Hem/lymph/immuno: no widespread bruising  PERTINENT MEDICATIONS:  Outpatient Encounter Medications as of 06/03/2021  Medication Sig   brimonidine (ALPHAGAN) 0.15 % ophthalmic solution INT 1 GTT INTO OU BID   cholecalciferol (VITAMIN D) 1000 UNITS tablet Take 1,000 Units by mouth 2 (two) times daily.    diclofenac (VOLTAREN) 50 MG EC tablet TAKE 1 TABLET THREE TIMES DAILY WITH MEALS   diclofenac sodium (VOLTAREN) 1 % GEL Apply 2 g topically 4 (four) times daily.   dorzolamide-timolol (COSOPT) 22.3-6.8 MG/ML ophthalmic solution Place 1 drop into both eyes daily.   lansoprazole (PREVACID) 30 MG capsule TAKE 1 CAPSULE (30 MG TOTAL) BY MOUTH DAILY AT 12 NOON.   latanoprost (XALATAN) 0.005 % ophthalmic solution Place 1 drop into both eyes at bedtime.    linaclotide (LINZESS) 72 MCG capsule Take 1 capsule (72 mcg total) by mouth daily before breakfast.   lisinopril (ZESTRIL) 20 MG tablet TAKE 1 TABLET EVERY DAY   Menaquinone-7 (VITAMIN K2 PO) Take 1 capsule by mouth daily.   sertraline (ZOLOFT) 100 MG tablet Take 1.5 tablets (150 mg total) by mouth daily.   No facility-administered encounter medications on file as of 06/03/2021.    HOSPICE ELIGIBILITY/DIAGNOSIS: TBD  PAST MEDICAL HISTORY:  Past Medical History:  Diagnosis Date   Arthritis    Osteoarthritis   Depression    GERD (gastroesophageal reflux disease)    Hyperlipidemia    Osteopenia       ALLERGIES:  Allergies  Allergen Reactions   Penicillins Rash      I spent  minutes providing this consultation; this includes time spent with patient/family, chart review and documentation. More than 50% of the time in this consultation was spent on counseling and coordinating communication   Thank you for the opportunity to participate in the care of [name] Please call our office at [phone] if we can be of additional assistance.  Note: Portions of this note were generated with [employer]. Dictation  errors may occur despite best attempts at proofreading.  Teodoro Spray, NP

## 2021-06-25 ENCOUNTER — Ambulatory Visit: Payer: Medicare HMO | Admitting: Family Medicine

## 2021-06-25 DIAGNOSIS — Z9889 Other specified postprocedural states: Secondary | ICD-10-CM | POA: Diagnosis not present

## 2021-06-25 DIAGNOSIS — H401122 Primary open-angle glaucoma, left eye, moderate stage: Secondary | ICD-10-CM | POA: Diagnosis not present

## 2021-06-25 DIAGNOSIS — R6889 Other general symptoms and signs: Secondary | ICD-10-CM | POA: Diagnosis not present

## 2021-06-25 DIAGNOSIS — H401113 Primary open-angle glaucoma, right eye, severe stage: Secondary | ICD-10-CM | POA: Diagnosis not present

## 2021-06-25 DIAGNOSIS — H43813 Vitreous degeneration, bilateral: Secondary | ICD-10-CM | POA: Diagnosis not present

## 2021-06-25 DIAGNOSIS — Z961 Presence of intraocular lens: Secondary | ICD-10-CM | POA: Diagnosis not present

## 2021-07-02 ENCOUNTER — Other Ambulatory Visit: Payer: Self-pay

## 2021-07-02 ENCOUNTER — Ambulatory Visit (INDEPENDENT_AMBULATORY_CARE_PROVIDER_SITE_OTHER): Payer: Medicare HMO | Admitting: Family Medicine

## 2021-07-02 ENCOUNTER — Encounter: Payer: Self-pay | Admitting: Family Medicine

## 2021-07-02 VITALS — BP 118/72 | HR 70 | Temp 97.1°F | Ht 61.0 in | Wt 107.4 lb

## 2021-07-02 DIAGNOSIS — Z23 Encounter for immunization: Secondary | ICD-10-CM

## 2021-07-02 DIAGNOSIS — I10 Essential (primary) hypertension: Secondary | ICD-10-CM

## 2021-07-02 DIAGNOSIS — R6889 Other general symptoms and signs: Secondary | ICD-10-CM | POA: Diagnosis not present

## 2021-07-02 DIAGNOSIS — H409 Unspecified glaucoma: Secondary | ICD-10-CM | POA: Diagnosis not present

## 2021-07-02 DIAGNOSIS — F411 Generalized anxiety disorder: Secondary | ICD-10-CM

## 2021-07-02 DIAGNOSIS — K5904 Chronic idiopathic constipation: Secondary | ICD-10-CM

## 2021-07-02 MED ORDER — LANSOPRAZOLE 30 MG PO CPDR: 30.0000 mg | DELAYED_RELEASE_CAPSULE | Freq: Every day | ORAL | 2 refills | Status: DC

## 2021-07-02 NOTE — Progress Notes (Signed)
Provider:  Alain Honey, MD  Careteam: Patient Care Team: Wardell Honour, MD as PCP - General (Family Medicine) Warden Fillers, MD as Consulting Physician (Ophthalmology) Griselda Miner, MD as Consulting Physician (Dermatology)  PLACE OF SERVICE:  [facility] Directive information Does Patient Have a Medical Advance Directive?: Yes, Type of Advance Directive: [place];Living will;Out of facility DNR (pink MOST or yellow form), Pre-existing out of facility DNR order (yellow form or pink MOST form): Yellow form placed in chart (order not valid for inpatient use), Does patient want to make changes to medical advance directive?: No - Patient declined  Allergies  Allergen Reactions   Penicillins Rash    Chief Complaint  Patient presents with   Medical Management of Chronic Issues    6 month follow-up. Discuss need for A1c, covid, shingrix, and flu vaccine or postpone/exclude if patient refuses.      HPI: Patient is a 87 y.o. female .  Routine visit for medical management of chronic problems including arthritis, hypertension, depression, patient lives alone and wonders why she still is living.  She has friends at the apartment place where she lives.  Lost daughter recently but depression is managed at this time with sertraline.  She sleeps okay weight is stable.  She uses a walker for ambulation and has had no falls recently.  Review of Systems:  Review of Systems  Constitutional: Negative.   Respiratory: Negative.    Cardiovascular: Negative.   Musculoskeletal:  Positive for joint pain. Negative for falls.  Neurological: Negative.   Psychiatric/Behavioral: Negative.    All other systems reviewed and are negative.  Past Medical History:  Diagnosis Date   Arthritis    Osteoarthritis   Depression    GERD (gastroesophageal reflux disease)    Hyperlipidemia    Osteopenia    Past Surgical History:  Procedure Laterality Date    APPENDECTOMY  1936   CERVICAL FUSION  2004   COLON SURGERY  06/12/2010   Colonoscopy   FINGER SURGERY  1989   MELANOMA EXCISION  11/16/2017   forehead   SPINE SURGERY  2009   Back Fusion   TONSILLECTOMY  1941   Social History:   reports that she has quit smoking. She has never used smokeless tobacco. She reports current alcohol use. She reports that she does not use drugs.  History reviewed. No pertinent family history.  Medications: Patient's Medications  New Prescriptions   No medications on file  Previous Medications   BRIMONIDINE (ALPHAGAN) 0.15 % OPHTHALMIC SOLUTION    INT 1 GTT INTO OU BID   CHOLECALCIFEROL (VITAMIN D) 1000 UNITS TABLET    Take 1,000 Units by mouth 2 (two) times daily.    DICLOFENAC (VOLTAREN) 50 MG EC TABLET    TAKE 1 TABLET THREE TIMES DAILY WITH MEALS   DICLOFENAC SODIUM (VOLTAREN) 1 % GEL    Apply 2 g topically 4 (four) times daily.   DORZOLAMIDE-TIMOLOL (COSOPT) 22.3-6.8 MG/ML OPHTHALMIC SOLUTION    Place 1 drop into both eyes daily.   LATANOPROST (XALATAN) 0.005 % OPHTHALMIC SOLUTION    Place 1 drop into both eyes at bedtime.    LINACLOTIDE (LINZESS) 72 MCG CAPSULE    Take 1 capsule (72 mcg total) by mouth daily before breakfast.   LISINOPRIL (ZESTRIL) 20 MG TABLET    TAKE 1 TABLET EVERY DAY   MENAQUINONE-7 (VITAMIN K2 PO)    Take 1 capsule by mouth daily.   SERTRALINE (ZOLOFT) 100 MG  TABLET    Take 1.5 tablets (150 mg total) by mouth daily.  Modified Medications   Modified Medication Previous Medication   LANSOPRAZOLE (PREVACID) 30 MG CAPSULE lansoprazole (PREVACID) 30 MG capsule      Take 1 capsule (30 mg total) by mouth daily at 12 noon.    TAKE 1 CAPSULE (30 MG TOTAL) BY MOUTH DAILY AT 12 NOON.  Discontinued Medications   No medications on file    Physical Exam:  Vitals:   07/02/21 1436  Weight: 107 lb 6.4 oz (48.7 kg)  Height: 5\' 1"  (1.549 m)   Body mass index is 20.29 kg/m. Wt Readings from Last 3 Encounters:  07/02/21 107 lb 6.4 oz  (48.7 kg)  05/14/21 105 lb 12.8 oz (48 kg)  03/13/21 111 lb (50.3 kg)    Physical Exam Vitals and nursing note reviewed.  Constitutional:      Appearance: Normal appearance.  Cardiovascular:     Rate and Rhythm: Normal rate and regular rhythm.  Pulmonary:     Effort: Pulmonary effort is normal.     Breath sounds: Normal breath sounds.  Neurological:     General: No focal deficit present.     Mental Status: She is alert and oriented to person, place, and time.     Comments: Patient's mind seems very good with good recall    Labs reviewed: Basic Metabolic Panel: No results for input(s): NA, K, CL, CO2, GLUCOSE, BUN, CREATININE, CALCIUM, MG, PHOS, TSH in the last 8760 hours. Liver Function Tests: No results for input(s): AST, ALT, ALKPHOS, BILITOT, PROT, ALBUMIN in the last 8760 hours. No results for input(s): LIPASE, AMYLASE in the last 8760 hours. No results for input(s): AMMONIA in the last 8760 hours. CBC: No results for input(s): WBC, NEUTROABS, HGB, HCT, MCV, PLT in the last 8760 hours. Lipid Panel: No results for input(s): CHOL, HDL, LDLCALC, TRIG, CHOLHDL, LDLDIRECT in the last 8760 hours. TSH: No results for input(s): TSH in the last 8760 hours. A1C: Lab Results  Component Value Date   HGBA1C 5.6 06/04/2020     Assessment/Plan  1. Need for influenza vaccination  - Flu Vaccine QUAD High Dose(Fluad)  2. Chronic idiopathic constipation Takes Linzess sometimes but it causes diarrhea.  We talked today about just increasing dose of MiraLAX to combat constipation  3. Essential hypertension, benign Blood pressure well managed at 118/72 on lisinopril 20 mg  4. Generalized anxiety disorder I think problem is more related to depression than anxiety.  Continue with sertraline 100 mg (1.5 tablets) daily  5. Glaucoma of both eyes, unspecified glaucoma type Followed by Dr. Katy Fitch maintained on 3 different eyedrops   Alain Honey, MD [place] [phone]

## 2021-07-03 ENCOUNTER — Telehealth: Payer: Self-pay

## 2021-07-03 NOTE — Telephone Encounter (Signed)
Message left on clinical intake voicemail:   Patient was here yesterday and forgot to sign transportation waiver and questions if we can mail to her.  I called patient back to inform her that I will mail waiver.

## 2021-07-09 ENCOUNTER — Telehealth: Payer: Self-pay | Admitting: *Deleted

## 2021-07-09 NOTE — Telephone Encounter (Signed)
[employer] Faxed Prior Authorization for Lansoprazole. Form filled out and placed in Dr. Ammie Ferrier folder to review and sign.   Member #: [id number] [place] [phone] Fax: [phone]

## 2021-07-15 ENCOUNTER — Other Ambulatory Visit: Payer: Self-pay

## 2021-07-15 ENCOUNTER — Other Ambulatory Visit: Payer: Self-pay | Admitting: Hospice

## 2021-07-15 DIAGNOSIS — E440 Moderate protein-calorie malnutrition: Secondary | ICD-10-CM

## 2021-07-15 DIAGNOSIS — Z515 Encounter for palliative care: Secondary | ICD-10-CM

## 2021-07-15 DIAGNOSIS — W19XXXD Unspecified fall, subsequent encounter: Secondary | ICD-10-CM

## 2021-07-15 DIAGNOSIS — G8929 Other chronic pain: Secondary | ICD-10-CM

## 2021-07-15 DIAGNOSIS — F339 Major depressive disorder, recurrent, unspecified: Secondary | ICD-10-CM

## 2021-07-15 DIAGNOSIS — M545 Low back pain, unspecified: Secondary | ICD-10-CM

## 2021-07-15 NOTE — Progress Notes (Signed)
[facility] Consult Note Telephone: [phone]  Fax: [phone]  PATIENT NAME: [name] DOB: 85/26/1928 MRN: [id number]  PRIMARY CARE PROVIDER:   Wardell Honour, MD Wardell Honour, [place],  [place] [address]  REFERRING PROVIDER: Wardell Honour, MD Wardell Honour, [place],  [place] [address]  RESPONSIBLE PARTY:  Self - [place] service coordinator [phone] office [phone] Contact Information     Name Relation Home Work Mobile   Rajvi, Armentor   [phone]       Visit is to build trust and highlight Palliative Medicine as specialized medical care for people living with serious illness, aimed at facilitating better quality of life through symptoms relief, assisting with advance care planning and complex medical decision making. [name] is present with patient during visit. This is a follow up visit.  RECOMMENDATIONS/PLAN:   Advance Care Planning/Code Status:Patient is a Do Not Resusucitate.  Goals of Care: Goals of care include to maximize quality of life and symptom management. Patient is interested in hospice service in the future when she qualifies for it.   Visit consisted of counseling and education dealing with the complex and emotionally intense issues of symptom management and palliative care in the setting of serious and potentially life-threatening illness. Palliative care team will continue to support patient, patient's family, and medical team.  Symptom management/Plan:  Protein Caloric Malnutrition. Current weight is 105 Ibs, weight gain of 3 Ibs since last visit 6 weeks ago. She reports improved appetiet and energy. Continue with Boost BID,  4-6 small meals daily. She reports appetite is good. Visit with PCP last week, unremarkable.  Fall: Fall without injuries last month. Education on slow position changes and use of rolling walker for support.  Fall precautions discussed. Depression: Continue Zoloft as ordered.  Psych consult as needed. Continue to participate socialization activities in the facility.  GERD: Improved. There was no need for endoscopy as earlier scheduled. She Continues Lansoprazole. Low Back pain: Well managed with Diclofenac   Discussion with Facility Coordinator [name] to call for Meals on wheels for patients. She requested [facility] SW tel number for more resources that patient can benefit from.  Follow up: Palliative care will continue to follow for complex medical decision making, advance care planning, and clarification of goals. Return 6 weeks or prn.Encouraged to call provider sooner with any concerns.    Family/Caregiver/Community Supports: Patient lives in [place]; her son [name] lives in [place]. She participates in health and wellness programs and other socialization activities, per [place] [employer] manager.    HOSPICE ELIGIBILITY/DIAGNOSIS: TBD   Chief Complaint: Follow up visit   HISTORY OF PRESENT ILLNESS:  [name] is a 87 y.o. year old female  with multiple medical conditions including recent fall with no injuries, Protein caloric malnutrition with chronic weight loss. History of  arthritis, low back pain, hypertension, GERD. She denies pain/discomfort, no respiratory distress. She endorses  good appetite, no depressive mood. History obtained from review of EMR, discussion with primary team, family and/or patient. Records reviewed and summarized above. All 10 point systems reviewed and are negative except as documented in history of present illness above  Review and summarization of Epic records shows history from other than patient.   Palliative Care was asked to follow this patient o help address complex decision making in the context of advance care planning and  goals of care clarification.   PHYSICAL EXAM  BP 118/70 P60 R 18 02 95% Height/Weight: 5 feet/105 Ibs Constitutional: NAD General:  Well groomed, cooperative EYES: anicteric sclera, lids intact, no discharge  ENMT: Moist mucous membrane CV: S1 S2, RRR, no LE edema Pulmonary: LCTA, no increased work of breathing, no cough, Abdomen: active BS + 4 quadrants, soft and non tender GU: no suprapubic tenderness MSK: weakness, sarcopenia, limited ROM, unsteady gait Skin: warm and dry, no rashes or wounds on visible skin Neuro:  weakness, otherwise non focal Psych: non-anxious affect Hem/lymph/immuno: no widespread bruising  PERTINENT MEDICATIONS:  Outpatient Encounter Medications as of 07/15/2021  Medication Sig   brimonidine (ALPHAGAN) 0.15 % ophthalmic solution INT 1 GTT INTO OU BID   cholecalciferol (VITAMIN D) 1000 UNITS tablet Take 1,000 Units by mouth 2 (two) times daily.    diclofenac (VOLTAREN) 50 MG EC tablet TAKE 1 TABLET THREE TIMES DAILY WITH MEALS   diclofenac sodium (VOLTAREN) 1 % GEL Apply 2 g topically 4 (four) times daily.   dorzolamide-timolol (COSOPT) 22.3-6.8 MG/ML ophthalmic solution Place 1 drop into both eyes daily.   lansoprazole (PREVACID) 30 MG capsule Take 1 capsule (30 mg total) by mouth daily at 12 noon.   latanoprost (XALATAN) 0.005 % ophthalmic solution Place 1 drop into both eyes at bedtime.    linaclotide (LINZESS) 72 MCG capsule Take 1 capsule (72 mcg total) by mouth daily before breakfast.   lisinopril (ZESTRIL) 20 MG tablet TAKE 1 TABLET EVERY DAY   Menaquinone-7 (VITAMIN K2 PO) Take 1 capsule by mouth daily.   sertraline (ZOLOFT) 100 MG tablet Take 1.5 tablets (150 mg total) by mouth daily.   No facility-administered encounter medications on file as of 07/15/2021.    HOSPICE ELIGIBILITY/DIAGNOSIS: TBD  PAST MEDICAL HISTORY:  Past Medical History:  Diagnosis Date   Arthritis    Osteoarthritis   Depression    GERD (gastroesophageal reflux disease)    Hyperlipidemia    Osteopenia       ALLERGIES:  Allergies  Allergen Reactions   Penicillins Rash      I spent 60 minutes  providing this consultation; this includes time spent with patient/family, chart review and documentation. More than 50% of the time in this consultation was spent on counseling and coordinating communication   Thank you for the opportunity to participate in the care of [name] Please call our office at [phone] if we can be of additional assistance.  Note: Portions of this note were generated with Lobbyist. Dictation errors may occur despite best attempts at proofreading.  Teodoro Spray, NP

## 2021-08-30 ENCOUNTER — Other Ambulatory Visit: Payer: Self-pay

## 2021-08-30 DIAGNOSIS — G8929 Other chronic pain: Secondary | ICD-10-CM

## 2021-08-30 DIAGNOSIS — I10 Essential (primary) hypertension: Secondary | ICD-10-CM

## 2021-08-30 NOTE — Telephone Encounter (Signed)
Refill request received from [facility] (via [facility]) for patient's medications. I called patient just to clarify this is pharmacy that patient is using since last refills were sent to [facility]. I left message for patient to call office to let us know.

## 2021-09-04 ENCOUNTER — Telehealth: Payer: Self-pay | Admitting: *Deleted

## 2021-09-04 MED ORDER — SERTRALINE HCL 100 MG PO TABS: 150.0000 mg | ORAL_TABLET | Freq: Every day | ORAL | 1 refills | Status: DC

## 2021-09-04 MED ORDER — DICLOFENAC SODIUM 50 MG PO TBEC: DELAYED_RELEASE_TABLET | ORAL | 1 refills | Status: DC

## 2021-09-04 MED ORDER — LISINOPRIL 20 MG PO TABS: 20.0000 mg | ORAL_TABLET | Freq: Every day | ORAL | 1 refills | Status: DC

## 2021-09-04 MED ORDER — LINACLOTIDE 72 MCG PO CAPS: 72.0000 ug | ORAL_CAPSULE | Freq: Every day | ORAL | 1 refills | Status: DC

## 2021-09-04 MED ORDER — LANSOPRAZOLE 30 MG PO CPDR: 30.0000 mg | DELAYED_RELEASE_CAPSULE | Freq: Every day | ORAL | 1 refills | Status: DC

## 2021-09-04 NOTE — Telephone Encounter (Signed)
Patient called and left message on Clinical Intake stating that she has been having Diarrhea and using Imodium. Stated that when she stops the Imodium the Diarrhea comes back. Patient is wanting a Rx to help treat the Diarrhea.   Tried calling patient and LMOM to return call.

## 2021-09-04 NOTE — Telephone Encounter (Signed)
Patient called and meds to be sent to [facility]. High warning came up when trying to send medication.  Medication pended and sent to Dr. Lillette Boxer. Sabra Heck for approval

## 2021-09-04 NOTE — Telephone Encounter (Signed)
Patient scheduled an appointment with [name] for [name] 1/19 to discuss

## 2021-09-05 ENCOUNTER — Telehealth: Payer: Self-pay

## 2021-09-05 ENCOUNTER — Other Ambulatory Visit: Payer: Self-pay

## 2021-09-05 ENCOUNTER — Ambulatory Visit (INDEPENDENT_AMBULATORY_CARE_PROVIDER_SITE_OTHER): Payer: No Typology Code available for payment source | Admitting: Nurse Practitioner

## 2021-09-05 DIAGNOSIS — K219 Gastro-esophageal reflux disease without esophagitis: Secondary | ICD-10-CM | POA: Diagnosis not present

## 2021-09-05 DIAGNOSIS — K5904 Chronic idiopathic constipation: Secondary | ICD-10-CM

## 2021-09-05 DIAGNOSIS — R197 Diarrhea, unspecified: Secondary | ICD-10-CM

## 2021-09-05 DIAGNOSIS — M159 Polyosteoarthritis, unspecified: Secondary | ICD-10-CM

## 2021-09-05 DIAGNOSIS — F331 Major depressive disorder, recurrent, moderate: Secondary | ICD-10-CM

## 2021-09-05 MED ORDER — LANSOPRAZOLE 15 MG PO CPDR: 15.0000 mg | DELAYED_RELEASE_CAPSULE | Freq: Every day | ORAL | 1 refills | Status: DC

## 2021-09-05 NOTE — Telephone Encounter (Signed)
Called patient. DWP. No further questions.

## 2021-09-05 NOTE — Telephone Encounter (Signed)
To stop Voltaren as discussed during visit Then once she gets new Rx for lower dose prevacid start it instead of just abruptly stopping it.

## 2021-09-05 NOTE — Telephone Encounter (Signed)
Ms. [name], [name] are scheduled for a virtual visit with your provider today.    Just as we do with appointments in the office, we must obtain your consent to participate.  Your consent will be active for this visit and any virtual visit you may have with one of our providers in the next 365 days.    If you have a MyChart account, I can also send a copy of this consent to you electronically.  All virtual visits are billed to your insurance company just like a traditional visit in the office.  As this is a virtual visit, video technology does not allow for your provider to perform a traditional examination.  This may limit your provider's ability to fully assess your condition.  If your provider identifies any concerns that need to be evaluated in person or the need to arrange testing such as labs, EKG, etc, we will make arrangements to do so.    Although advances in technology are sophisticated, we cannot ensure that it will always work on either your end or our end.  If the connection with a video visit is poor, we may have to switch to a telephone visit.  With either a video or telephone visit, we are not always able to ensure that we have a secure connection.   I need to obtain your verbal consent now.   Are you willing to proceed with your visit today?   [name] has provided verbal consent on 09/05/2021 for a virtual visit (video or telephone).   Carroll Kinds, CMA 09/05/2021  9:00 AM

## 2021-09-05 NOTE — Patient Instructions (Signed)
Add benefiber daily  Start probiotic daily Stop voltaren and use tylenol instead

## 2021-09-05 NOTE — Progress Notes (Signed)
Careteam: Patient Care Team: Wardell Honour, MD as PCP - General (Family Medicine) Warden Fillers, MD as Consulting Physician (Ophthalmology) Griselda Miner, MD as Consulting Physician (Dermatology)  Advanced Directive information    Allergies  Allergen Reactions   Penicillins Rash    Chief Complaint  Patient presents with   Acute Visit    Patient complains of diarrhea for 2 weeks. Patient has been taking imodium. She would like to know if there is some thing else she may be able to take. She doesn't have diarrhea everyday. Denies having any abdominal pain or cramping.     HPI: Patient is a 88 y.o. female due to diarrhea.  She was constipated and got a prescription for linzess but then she started having diarrhea.  She stopped linzess several months ago.  She has continued to have diarrhea on and off since then. No issues with constipation since. She has not had any abdominal pain, nausea or vomiting.  She will have 1-2 BMs a day and sometimes she does not have a BM.  No other changes in medication.   She continues to have some depression- she has bee on zoloft 150 mg for over a year.   She was not using Voltaren but now taking 1 tablet daily, has been doing that a couple of weeks.   She has hx of GERD well controlled on prevacid.   Review of Systems:  Review of Systems  Constitutional:  Negative for chills, fever and weight loss.  HENT:  Negative for tinnitus.   Respiratory:  Negative for cough, sputum production and shortness of breath.   Cardiovascular:  Negative for chest pain, palpitations and leg swelling.  Gastrointestinal:  Positive for diarrhea. Negative for abdominal pain, constipation and heartburn.  Genitourinary:  Negative for dysuria, frequency and urgency.  Musculoskeletal:  Positive for back pain. Negative for falls, joint pain and myalgias.  Skin: Negative.   Neurological:  Negative for dizziness and headaches.  Psychiatric/Behavioral:   Positive for depression. Negative for memory loss. The patient does not have insomnia.    Past Medical History:  Diagnosis Date   Arthritis    Osteoarthritis   Depression    GERD (gastroesophageal reflux disease)    Hyperlipidemia    Osteopenia    Past Surgical History:  Procedure Laterality Date   APPENDECTOMY  1936   CERVICAL FUSION  2004   COLON SURGERY  06/12/2010   Colonoscopy   FINGER SURGERY  1989   MELANOMA EXCISION  11/16/2017   forehead   SPINE SURGERY  2009   Back Fusion   TONSILLECTOMY  1941   Social History:   reports that she has quit smoking. She has never used smokeless tobacco. She reports current alcohol use. She reports that she does not use drugs.  No family history on file.  Medications: Patient's Medications  New Prescriptions   No medications on file  Previous Medications   BRIMONIDINE (ALPHAGAN) 0.15 % OPHTHALMIC SOLUTION    INT 1 GTT INTO OU BID   CHOLECALCIFEROL (VITAMIN D) 1000 UNITS TABLET    Take 1,000 Units by mouth 2 (two) times daily.    DICLOFENAC (VOLTAREN) 50 MG EC TABLET    TAKE 1 TABLET THREE TIMES DAILY WITH MEALS   DICLOFENAC SODIUM (VOLTAREN) 1 % GEL    Apply 2 g topically 4 (four) times daily.   DORZOLAMIDE-TIMOLOL (COSOPT) 22.3-6.8 MG/ML OPHTHALMIC SOLUTION    Place 1 drop into both eyes daily.   LANSOPRAZOLE (PREVACID) 30  MG CAPSULE    Take 1 capsule (30 mg total) by mouth daily at 12 noon.   LATANOPROST (XALATAN) 0.005 % OPHTHALMIC SOLUTION    Place 1 drop into both eyes at bedtime.    LISINOPRIL (ZESTRIL) 20 MG TABLET    Take 1 tablet (20 mg total) by mouth daily.   MENAQUINONE-7 (VITAMIN K2 PO)    Take 1 capsule by mouth daily.   SERTRALINE (ZOLOFT) 100 MG TABLET    Take 1.5 tablets (150 mg total) by mouth daily.  Modified Medications   No medications on file  Discontinued Medications   LINACLOTIDE (LINZESS) 72 MCG CAPSULE    Take 1 capsule (72 mcg total) by mouth daily before breakfast.    Physical Exam:  There were no  vitals filed for this visit. There is no height or weight on file to calculate BMI. Wt Readings from Last 3 Encounters:  07/02/21 107 lb 6.4 oz (48.7 kg)  05/14/21 105 lb 12.8 oz (48 kg)  03/13/21 111 lb (50.3 kg)    Physical Exam  Labs reviewed: Basic Metabolic Panel: No results for input(s): NA, K, CL, CO2, GLUCOSE, BUN, CREATININE, CALCIUM, MG, PHOS, TSH in the last 8760 hours. Liver Function Tests: No results for input(s): AST, ALT, ALKPHOS, BILITOT, PROT, ALBUMIN in the last 8760 hours. No results for input(s): LIPASE, AMYLASE in the last 8760 hours. No results for input(s): AMMONIA in the last 8760 hours. CBC: No results for input(s): WBC, NEUTROABS, HGB, HCT, MCV, PLT in the last 8760 hours. Lipid Panel: No results for input(s): CHOL, HDL, LDLCALC, TRIG, CHOLHDL, LDLDIRECT in the last 8760 hours. TSH: No results for input(s): TSH in the last 8760 hours. A1C: Lab Results  Component Value Date   HGBA1C 5.6 06/04/2020     Assessment/Plan 1. GERD without esophagitis -stable without any symptoms of GERD. Side effect of prevacid can include diarrhea. Will attempt dose reduction to see if symptoms continues to be controlled.  - lansoprazole (PREVACID) 15 MG capsule; Take 1 capsule (15 mg total) by mouth daily.  Dispense: 90 capsule; Refill: 1  2. Chronic idiopathic constipation -has completely resolved.   3. Diarrhea, unspecified type -ongoing for several months, no abdominal pain, nausea or vomiting. Will see if dose reductions/stopping medications that could cause GI upset will help at this time.  -to also add benefiber daily and probiotic daily   4. Moderate episode of recurrent major depressive disorder (HCC) Stable, ongoing depression on zoloft 150 mg daily. Side effect dose include diarrhea and if persist with other dose reduction will look into reduction or changing medication for better control of symptoms.   5. Generalized osteoarthritis of multiple sites -has  consistently been taking voltaren tablets over the last few months which has been different than prior. Will have her stop at this time to to see if this helps with diarrhea. To use tylenol 1000 mg every 8 hours as needed pain.    Next appt: 2 weeks to follo wup diarrhea  Cainan Trull K. Harle Battiest  [facility] [phone]    Virtual Visit via telephone  I connected with patient on 09/05/21 at  9:00 AM EST by telephone and verified that I am speaking with the correct person using two identifiers.  Location: Patient: home Provider: [facility]   I discussed the limitations, risks, security and privacy concerns of performing an evaluation and management service by telephone and the availability of in person appointments. I also discussed with the  patient that there may be a patient responsible charge related to this service. The patient expressed understanding and agreed to proceed.   I discussed the assessment and treatment plan with the patient. The patient was provided an opportunity to ask questions and all were answered. The patient agreed with the plan and demonstrated an understanding of the instructions.   The patient was advised to call back or seek an in-person evaluation if the symptoms worsen or if the condition fails to improve as anticipated.  I provided 18 minutes of non-face-to-face time during this encounter.  Carlos American. Harle Battiest Avs printed and mailed

## 2021-09-05 NOTE — Telephone Encounter (Signed)
Patient called stating she had her phone visit with Dewaine Oats today and she forgot to mention the prevacid is in fact a new Rx from Dr. Sabra Heck that she is taking, not protonix. Should she stop it? [phone])

## 2021-09-05 NOTE — Progress Notes (Signed)
This service is provided via telemedicine  No vital signs collected/recorded due to the encounter was a telemedicine visit.   Location of patient (ex: home, work):  Home  Patient consents to a telephone visit:  Yes, see encounter dated 09/05/2021  Location of the provider (ex: office, home):  [place]  Name of any referring provider:  Wardell Honour, MD  Names of all persons participating in the telemedicine service and their role in the encounter:  Sherrie Mustache, Nurse Practitioner, Carroll Kinds, CMA, and patient.   Time spent on call:  8 minutes with medical assistant

## 2021-09-09 ENCOUNTER — Other Ambulatory Visit: Payer: Self-pay | Admitting: *Deleted

## 2021-09-09 DIAGNOSIS — K219 Gastro-esophageal reflux disease without esophagitis: Secondary | ICD-10-CM

## 2021-09-09 MED ORDER — LANSOPRAZOLE 15 MG PO CPDR: 15.0000 mg | DELAYED_RELEASE_CAPSULE | Freq: Every day | ORAL | 0 refills | Status: DC

## 2021-09-09 NOTE — Telephone Encounter (Signed)
Patient called and stated that the pharmacy did not receive her lower dose rx for Prevacid.   [name] sent Rx to Mail order not local pharmacy.   Patient requested a small amount to be sent to local pharmacy until she can receive the mail order.

## 2021-09-10 ENCOUNTER — Telehealth: Payer: Self-pay | Admitting: *Deleted

## 2021-09-10 MED ORDER — OMEPRAZOLE 20 MG PO CPDR: 20.0000 mg | DELAYED_RELEASE_CAPSULE | Freq: Every day | ORAL | 1 refills | Status: DC

## 2021-09-10 NOTE — Telephone Encounter (Signed)
Christine Honour, MD  You 3 minutes ago (3:39 PM)   [name] to switch    Patient notified and agreed. Medication list updated and Rx sent to [facility] per patient request.

## 2021-09-10 NOTE — Telephone Encounter (Signed)
Patient called and stated that her pharmacy told her that if she would change her Lansoprazole to Omeprazole she would not have a CoPay.   Currently the Lansoprazole coPay is $60 vs $0 for the Omeprazole.   Patient is requesting to switch to Omeprazole.  Please Advise.

## 2021-09-17 ENCOUNTER — Ambulatory Visit: Payer: Medicare HMO | Admitting: Family Medicine

## 2021-09-19 ENCOUNTER — Ambulatory Visit (INDEPENDENT_AMBULATORY_CARE_PROVIDER_SITE_OTHER): Payer: No Typology Code available for payment source | Admitting: Nurse Practitioner

## 2021-09-19 ENCOUNTER — Other Ambulatory Visit: Payer: Self-pay

## 2021-09-19 DIAGNOSIS — R197 Diarrhea, unspecified: Secondary | ICD-10-CM

## 2021-09-19 DIAGNOSIS — K5904 Chronic idiopathic constipation: Secondary | ICD-10-CM

## 2021-09-19 DIAGNOSIS — K219 Gastro-esophageal reflux disease without esophagitis: Secondary | ICD-10-CM

## 2021-09-19 MED ORDER — OMEPRAZOLE 20 MG PO CPDR: 20.0000 mg | DELAYED_RELEASE_CAPSULE | Freq: Every day | ORAL | 1 refills | Status: DC

## 2021-09-19 NOTE — Progress Notes (Signed)
This service is provided via telemedicine  No vital signs collected/recorded due to the encounter was a telemedicine visit.   Location of patient (ex: home, work):  Home  Patient consents to a telephone visit:  Yes, see encounter dated 09/05/2021  Location of the provider (ex: office, home):  [place]  Name of any referring provider:  Alain Honey, MD  Names of all persons participating in the telemedicine service and their role in the encounter:  Sherrie Mustache, Nurse Practitioner, Carroll Kinds, CMA, and patient.   Time spent on call:  7 minutes with medical assistant

## 2021-09-19 NOTE — Progress Notes (Signed)
Careteam: Patient Care Team: Wardell Honour, MD as PCP - General (Family Medicine) Warden Fillers, MD as Consulting Physician (Ophthalmology) Griselda Miner, MD as Consulting Physician (Dermatology)  Advanced Directive information    Allergies  Allergen Reactions   Penicillins Rash    Chief Complaint  Patient presents with   Follow-up    Follow up on diarrhea. Patient has finally gotten rid of diarrhea. Patient has not had to take any immodium. Last episode of Diarrhea was about a week ago.     HPI: Patient is a 88 y.o. female to follow up diarrhea. At last visit discussed stopping her Voltaren tablets and stopped prevacid.  She has not taken any imodium since January 30th. She has a regular bowel movement the 31st and today.   Omeprazole was started when prevacid stopped, she has not gotten medication. GERD is well controlled at this time.   She also stopped voltaren tablets and only using tylenol , not as effective but does control pain.   She started taking biotin, unsure if it is due to stress or old age.    Review of Systems:  Review of Systems  Constitutional:  Negative for chills, fever and weight loss.  HENT:  Negative for tinnitus.   Respiratory:  Negative for cough, sputum production and shortness of breath.   Cardiovascular:  Negative for chest pain, palpitations and leg swelling.  Gastrointestinal:  Negative for abdominal pain, constipation, diarrhea and heartburn.  Genitourinary:  Negative for dysuria, frequency and urgency.  Musculoskeletal:  Positive for back pain and joint pain. Negative for myalgias.  Skin: Negative.   Neurological:  Negative for dizziness and headaches.  Psychiatric/Behavioral:  Negative for depression and memory loss. The patient does not have insomnia.    Past Medical History:  Diagnosis Date   Arthritis    Osteoarthritis   Depression    GERD (gastroesophageal reflux disease)    Hyperlipidemia    Osteopenia     Past Surgical History:  Procedure Laterality Date   APPENDECTOMY  1936   CERVICAL FUSION  2004   COLON SURGERY  06/12/2010   Colonoscopy   FINGER SURGERY  1989   MELANOMA EXCISION  11/16/2017   forehead   SPINE SURGERY  2009   Back Fusion   TONSILLECTOMY  1941   Social History:   reports that she has quit smoking. She has never used smokeless tobacco. She reports current alcohol use. She reports that she does not use drugs.  No family history on file.  Medications: Patient's Medications  New Prescriptions   No medications on file  Previous Medications   BRIMONIDINE (ALPHAGAN) 0.15 % OPHTHALMIC SOLUTION    INT 1 GTT INTO OU BID   CHOLECALCIFEROL (VITAMIN D) 1000 UNITS TABLET    Take 1,000 Units by mouth 2 (two) times daily.    DICLOFENAC SODIUM (VOLTAREN) 1 % GEL    Apply 2 g topically 4 (four) times daily.   DORZOLAMIDE-TIMOLOL (COSOPT) 22.3-6.8 MG/ML OPHTHALMIC SOLUTION    Place 1 drop into both eyes daily.   LATANOPROST (XALATAN) 0.005 % OPHTHALMIC SOLUTION    Place 1 drop into both eyes at bedtime.    LISINOPRIL (ZESTRIL) 20 MG TABLET    Take 1 tablet (20 mg total) by mouth daily.   MENAQUINONE-7 (VITAMIN K2 PO)    Take 1 capsule by mouth daily.   OMEPRAZOLE (PRILOSEC) 20 MG CAPSULE    Take 1 capsule (20 mg total) by mouth daily.   SERTRALINE (ZOLOFT)  100 MG TABLET    Take 1.5 tablets (150 mg total) by mouth daily.  Modified Medications   No medications on file  Discontinued Medications   No medications on file    Physical Exam:  There were no vitals filed for this visit. There is no height or weight on file to calculate BMI. Wt Readings from Last 3 Encounters:  07/02/21 107 lb 6.4 oz (48.7 kg)  05/14/21 105 lb 12.8 oz (48 kg)  03/13/21 111 lb (50.3 kg)      Labs reviewed: Basic Metabolic Panel: No results for input(s): NA, K, CL, CO2, GLUCOSE, BUN, CREATININE, CALCIUM, MG, PHOS, TSH in the last 8760 hours. Liver Function Tests: No results for input(s):  AST, ALT, ALKPHOS, BILITOT, PROT, ALBUMIN in the last 8760 hours. No results for input(s): LIPASE, AMYLASE in the last 8760 hours. No results for input(s): AMMONIA in the last 8760 hours. CBC: No results for input(s): WBC, NEUTROABS, HGB, HCT, MCV, PLT in the last 8760 hours. Lipid Panel: No results for input(s): CHOL, HDL, LDLCALC, TRIG, CHOLHDL, LDLDIRECT in the last 8760 hours. TSH: No results for input(s): TSH in the last 8760 hours. A1C: Lab Results  Component Value Date   HGBA1C 5.6 06/04/2020     Assessment/Plan 1. Chronic idiopathic constipation -well controlled at this time, can add back colace or miralax as needed if reoccurs   2. Diarrhea, unspecified type -has significantly improved. Continue current regimen.   3. GERD without esophagitis Stable on omeprazole. Continue lifestyle modifications.  - omeprazole (PRILOSEC) 20 MG capsule; Take 1 capsule (20 mg total) by mouth daily.  Dispense: 90 capsule; Refill: 1   Alithia Zavaleta K. Harle Battiest  [facility] [phone]    Virtual Visit via telephone  I connected with patient on 09/19/21 at  9:30 AM EST by telephone and verified that I am speaking with the correct person using two identifiers.  Location: Patient: home Provider: [facility]   I discussed the limitations, risks, security and privacy concerns of performing an evaluation and management service by telephone and the availability of in person appointments. I also discussed with the patient that there may be a patient responsible charge related to this service. The patient expressed understanding and agreed to proceed.   I discussed the assessment and treatment plan with the patient. The patient was provided an opportunity to ask questions and all were answered. The patient agreed with the plan and demonstrated an understanding of the instructions.   The patient was advised to call back or seek an in-person evaluation if the symptoms  worsen or if the condition fails to improve as anticipated.  I provided 12 minutes of non-face-to-face time during this encounter.  [employer]. Harle Battiest Avs printed and mailed

## 2021-10-03 ENCOUNTER — Ambulatory Visit (INDEPENDENT_AMBULATORY_CARE_PROVIDER_SITE_OTHER): Payer: No Typology Code available for payment source | Admitting: Nurse Practitioner

## 2021-10-03 ENCOUNTER — Encounter: Payer: Self-pay | Admitting: Nurse Practitioner

## 2021-10-03 ENCOUNTER — Other Ambulatory Visit: Payer: Self-pay

## 2021-10-03 ENCOUNTER — Encounter: Payer: Medicare HMO | Admitting: Family

## 2021-10-03 DIAGNOSIS — Z0000 Encounter for general adult medical examination without abnormal findings: Secondary | ICD-10-CM

## 2021-10-03 NOTE — Progress Notes (Signed)
This service is provided via telemedicine  No vital signs collected/recorded due to the encounter was a telemedicine visit.   Location of patient (ex: home, work):  Home  Patient consents to a telephone visit:  Yes, see encounter dated 10/03/2021  Location of the provider (ex: office, home):  [place]  Name of any referring provider:  Alain Honey, MD  Names of all persons participating in the telemedicine service and their role in the encounter:  Sherrie Mustache, Nurse Practitioner, Carroll Kinds, CMA, and patient.   Time spent on call:  13 minutes with medical assistant

## 2021-10-03 NOTE — Patient Instructions (Signed)
[name] , Thank you for taking time to come for your Medicare Wellness Visit. I appreciate your ongoing commitment to your health goals. Please review the following plan we discussed and let me know if I can assist you in the future.   Screening recommendations/referrals: Colonoscopy aged out Mammogram aged out Bone Density decline Recommended yearly ophthalmology/optometry visit for glaucoma screening and checkup Recommended yearly dental visit for hygiene and checkup  Vaccinations: Influenza vaccine up to date Pneumococcal vaccine up to date Tdap vaccine up to date Shingles vaccine DUE- recommend to get at your local pharmacy       Advanced directives: on file  Conditions/risks identified: advanced age  Next appointment: yearly   Preventive Care 86 Years and Older, Female Preventive care refers to lifestyle choices and visits with your health care provider that can promote health and wellness. What does preventive care include? A yearly physical exam. This is also called an annual well check. Dental exams once or twice a year. Routine eye exams. Ask your health care provider how often you should have your eyes checked. Personal lifestyle choices, including: Daily care of your teeth and gums. Regular physical activity. Eating a healthy diet. Avoiding tobacco and drug use. Limiting alcohol use. Practicing safe sex. Taking low-dose aspirin every day. Taking vitamin and mineral supplements as recommended by your health care provider. What happens during an annual well check? The services and screenings done by your health care provider during your annual well check will depend on your age, overall health, lifestyle risk factors, and family history of disease. Counseling  Your health care provider may ask you questions about your: Alcohol use. Tobacco use. Drug use. Emotional well-being. Home and relationship well-being. Sexual activity. Eating habits. History of  falls. Memory and ability to understand (cognition). Work and work Statistician. Reproductive health. Screening  You may have the following tests or measurements: Height, weight, and BMI. Blood pressure. Lipid and cholesterol levels. These may be checked every 5 years, or more frequently if you are over 74 years old. Skin check. Lung cancer screening. You may have this screening every year starting at age 86 if you have a 30-pack-year history of smoking and currently smoke or have quit within the past 15 years. Fecal occult blood test (FOBT) of the stool. You may have this test every year starting at age 86 Flexible sigmoidoscopy or colonoscopy. You may have a sigmoidoscopy every 5 years or a colonoscopy every 10 years starting at age 6. Hepatitis C blood test. Hepatitis B blood test. Sexually transmitted disease (STD) testing. Diabetes screening. This is done by checking your blood sugar (glucose) after you have not eaten for a while (fasting). You may have this done every 1-3 years. Bone density scan. This is done to screen for osteoporosis. You may have this done starting at age 86 Mammogram. This may be done every 1-2 years. Talk to your health care provider about how often you should have regular mammograms. Talk with your health care provider about your test results, treatment options, and if necessary, the need for more tests. Vaccines  Your health care provider may recommend certain vaccines, such as: Influenza vaccine. This is recommended every year. Tetanus, diphtheria, and acellular pertussis (Tdap, Td) vaccine. You may need a Td booster every 10 years. Zoster vaccine. You may need this after age 86. Pneumococcal 13-valent conjugate (PCV13) vaccine. One dose is recommended after age 86 Pneumococcal polysaccharide (PPSV23) vaccine. One dose is recommended after age 86 Talk to your  health care provider about which screenings and vaccines you need and how often you need  them. This information is not intended to replace advice given to you by your health care provider. Make sure you discuss any questions you have with your health care provider. Document Released: 08/31/2015 Document Revised: 04/23/2016 Document Reviewed: 06/05/2015 Elsevier Interactive Patient Education  2017 [place] Prevention in the Home Falls can cause injuries. They can happen to people of all ages. There are many things you can do to make your home safe and to help prevent falls. What can I do on the outside of my home? Regularly fix the edges of walkways and driveways and fix any cracks. Remove anything that might make you trip as you walk through a door, such as a raised step or threshold. Trim any bushes or trees on the path to your home. Use bright outdoor lighting. Clear any walking paths of anything that might make someone trip, such as rocks or tools. Regularly check to see if handrails are loose or broken. Make sure that both sides of any steps have handrails. Any raised decks and porches should have guardrails on the edges. Have any leaves, snow, or ice cleared regularly. Use sand or salt on walking paths during winter. Clean up any spills in your garage right away. This includes oil or grease spills. What can I do in the bathroom? Use night lights. Install grab bars by the toilet and in the tub and shower. Do not use towel bars as grab bars. Use non-skid mats or decals in the tub or shower. If you need to sit down in the shower, use a plastic, non-slip stool. Keep the floor dry. Clean up any water that spills on the floor as soon as it happens. Remove soap buildup in the tub or shower regularly. Attach bath mats securely with double-sided non-slip rug tape. Do not have throw rugs and other things on the floor that can make you trip. What can I do in the bedroom? Use night lights. Make sure that you have a light by your bed that is easy to reach. Do not use  any sheets or blankets that are too big for your bed. They should not hang down onto the floor. Have a firm chair that has side arms. You can use this for support while you get dressed. Do not have throw rugs and other things on the floor that can make you trip. What can I do in the kitchen? Clean up any spills right away. Avoid walking on wet floors. Keep items that you use a lot in easy-to-reach places. If you need to reach something above you, use a strong step stool that has a grab bar. Keep electrical cords out of the way. Do not use floor polish or wax that makes floors slippery. If you must use wax, use non-skid floor wax. Do not have throw rugs and other things on the floor that can make you trip. What can I do with my stairs? Do not leave any items on the stairs. Make sure that there are handrails on both sides of the stairs and use them. Fix handrails that are broken or loose. Make sure that handrails are as long as the stairways. Check any carpeting to make sure that it is firmly attached to the stairs. Fix any carpet that is loose or worn. Avoid having throw rugs at the top or bottom of the stairs. If you do have throw rugs, attach them  to the floor with carpet tape. Make sure that you have a light switch at the top of the stairs and the bottom of the stairs. If you do not have them, ask someone to add them for you. What else can I do to help prevent falls? Wear shoes that: Do not have high heels. Have rubber bottoms. Are comfortable and fit you well. Are closed at the toe. Do not wear sandals. If you use a stepladder: Make sure that it is fully opened. Do not climb a closed stepladder. Make sure that both sides of the stepladder are locked into place. Ask someone to hold it for you, if possible. Clearly mark and make sure that you can see: Any grab bars or handrails. First and last steps. Where the edge of each step is. Use tools that help you move around (mobility aids)  if they are needed. These include: Canes. Walkers. Scooters. Crutches. Turn on the lights when you go into a dark area. Replace any light bulbs as soon as they burn out. Set up your furniture so you have a clear path. Avoid moving your furniture around. If any of your floors are uneven, fix them. If there are any pets around you, be aware of where they are. Review your medicines with your doctor. Some medicines can make you feel dizzy. This can increase your chance of falling. Ask your doctor what other things that you can do to help prevent falls. This information is not intended to replace advice given to you by your health care provider. Make sure you discuss any questions you have with your health care provider. Document Released: 05/31/2009 Document Revised: 01/10/2016 Document Reviewed: 09/08/2014 Elsevier Interactive Patient Education  2017 [employer].

## 2021-10-03 NOTE — Progress Notes (Signed)
Subjective:   [name] is a 88 y.o. female who presents for Medicare Annual (Subsequent) preventive examination.  Review of Systems     Cardiac Risk Factors include: dyslipidemia;advanced age (>32men, >75 women);hypertension     Objective:    There were no vitals filed for this visit. There is no height or weight on file to calculate BMI.  Advanced Directives 10/03/2021 07/02/2021 04/08/2021 12/18/2020 09/28/2020 06/04/2020 01/30/2020  Does Patient Have a Medical Advance Directive? Yes Yes Yes Yes Yes Yes Yes  Type of Paramedic of [place];Out of facility DNR (pink MOST or yellow form) [place];Living will;Out of facility DNR (pink MOST or yellow form) Healthcare Power of [place];Out of facility DNR (pink MOST or yellow form) Healthcare Power of [place] of facility DNR (pink MOST or yellow form) Out of facility DNR (pink MOST or yellow form)  Does patient want to make changes to medical advance directive? No - Patient declined No - Patient declined No - Patient declined No - Patient declined No - Patient declined No - Patient declined No - Guardian declined  Copy of [place] in Chart? Yes - validated most recent copy scanned in chart (See row information) Yes - validated most recent copy scanned in chart (See row information) Yes - validated most recent copy scanned in chart (See row information) Yes - validated most recent copy scanned in chart (See row information) Yes - validated most recent copy scanned in chart (See row information) - -  Would patient like information on creating a medical advance directive? - - - - - - -  Pre-existing out of facility DNR order (yellow form or pink MOST form) Yellow form placed in chart (order not valid for inpatient use) Yellow form placed in chart (order not valid for inpatient use) - Yellow form placed in chart (order not valid for inpatient use) - - Pink  MOST/Yellow Form most recent copy in chart - Physician notified to receive inpatient order    Current Medications (verified) Outpatient Encounter Medications as of 10/03/2021  Medication Sig   BIOTIN PO Take by mouth daily.   brimonidine (ALPHAGAN) 0.15 % ophthalmic solution INT 1 GTT INTO OU BID   cholecalciferol (VITAMIN D) 1000 UNITS tablet Take 1,000 Units by mouth 2 (two) times daily.    diclofenac sodium (VOLTAREN) 1 % GEL Apply 2 g topically 4 (four) times daily.   dorzolamide-timolol (COSOPT) 22.3-6.8 MG/ML ophthalmic solution Place 1 drop into both eyes daily.   latanoprost (XALATAN) 0.005 % ophthalmic solution Place 1 drop into both eyes at bedtime.    lisinopril (ZESTRIL) 20 MG tablet Take 1 tablet (20 mg total) by mouth daily.   Menaquinone-7 (VITAMIN K2 PO) Take 1 capsule by mouth daily.   omeprazole (PRILOSEC) 20 MG capsule Take 1 capsule (20 mg total) by mouth daily.   sertraline (ZOLOFT) 100 MG tablet Take 1.5 tablets (150 mg total) by mouth daily.   No facility-administered encounter medications on file as of 10/03/2021.    Allergies (verified) Penicillins   History: Past Medical History:  Diagnosis Date   Arthritis    Osteoarthritis   Depression    GERD (gastroesophageal reflux disease)    Hyperlipidemia    Osteopenia    Past Surgical History:  Procedure Laterality Date   APPENDECTOMY  1936   CERVICAL FUSION  2004   COLON SURGERY  06/12/2010   Colonoscopy   FINGER SURGERY  1989  MELANOMA EXCISION  11/16/2017   forehead   SPINE SURGERY  2009   Back Fusion   TONSILLECTOMY  1941   History reviewed. No pertinent family history. Social History   Socioeconomic History   Marital status: Divorced    Spouse name: Not on file   Number of children: Not on file   Years of education: Not on file   Highest education level: Not on file  Occupational History   Not on file  Tobacco Use   Smoking status: Former   Smokeless tobacco: Never   Tobacco comments:     Quit at age 24   Vaping Use   Vaping Use: Never used  Substance and Sexual Activity   Alcohol use: Not Currently    Comment: Occasionally    Drug use: No   Sexual activity: Never  Other Topics Concern   Not on file  Social History Narrative   Not on file   Social Determinants of Health   Financial Resource Strain: Not on file  Food Insecurity: Not on file  Transportation Needs: Not on file  Physical Activity: Not on file  Stress: Not on file  Social Connections: Not on file    Tobacco Counseling Counseling given: Not Answered Tobacco comments: Quit at age 37    Clinical Intake:     Pain :  (back bothers her occasionally, no pain currently)     BMI - recorded: 19 Nutritional Status: BMI of 19-24  Normal Nutritional Risks: None  How often do you need to have someone help you when you read instructions, pamphlets, or other written materials from your doctor or pharmacy?: 1 - Never  Diabetic?no         Activities of Daily Living In your present state of health, do you have any difficulty performing the following activities: 10/03/2021  Hearing? N  Vision? N  Difficulty concentrating or making decisions? N  Walking or climbing stairs? N  Comment does not climb stairs  Dressing or bathing? N  Doing errands, shopping? N  Preparing Food and eating ? N  Using the Toilet? N  In the past six months, have you accidently leaked urine? Y  Do you have problems with loss of bowel control? N  Managing your Medications? N  Managing your Finances? N  Comment son Sales promotion account executive? N  Some recent data might be hidden    Patient Care Team: Wardell Honour, MD as PCP - General (Family Medicine) Warden Fillers, MD as Consulting Physician (Ophthalmology) Griselda Miner, MD as Consulting Physician (Dermatology)  Indicate any recent Medical Services you may have received from other than [facility] providers in the past year  (date may be approximate).     Assessment:   This is a routine wellness examination for [facility].  Hearing/Vision screen Hearing Screening - Comments:: Patient has no hearing problems. Vision Screening - Comments:: Patient has no vision problems. Patient wears glasses. Patient had las eye exam 3 months ago. Patient sees Dr. Warden Fillers.  Dietary issues and exercise activities discussed: Current Exercise Habits: The patient does not participate in regular exercise at present   Goals Addressed   None    Depression Screen PHQ 2/9 Scores 10/03/2021 09/28/2020 06/04/2020 01/30/2020 11/08/2019 09/26/2019 03/24/2019  PHQ - 2 Score 0 0 0 0 0 0 0    Fall Risk Fall Risk  10/03/2021 07/02/2021 05/14/2021 04/08/2021 03/13/2021  Falls in the past year? 1 1 0 1 0  Number falls  in past yr: 1 0 0 0 0  Injury with Fall? 0 0 0 0 0  Comment - - - - -  Risk for fall due to : History of fall(s) History of fall(s) History of fall(s) History of fall(s) History of fall(s)  Follow up Falls evaluation completed Falls evaluation completed;Education provided;Falls prevention discussed Falls evaluation completed;Education provided;Falls prevention discussed Falls evaluation completed Falls evaluation completed;Education provided;Falls prevention discussed    FALL RISK PREVENTION PERTAINING TO THE HOME:  Any stairs in or around the home? No  If so, are there any without handrails? No  Home free of loose throw rugs in walkways, pet beds, electrical cords, etc? Yes  Adequate lighting in your home to reduce risk of falls? Yes   ASSISTIVE DEVICES UTILIZED TO PREVENT FALLS:  Life alert? Yes  Use of a cane, walker or w/c? Yes  Grab bars in the bathroom? Yes  Shower chair or bench in shower? Yes  Elevated toilet seat or a handicapped toilet? Yes   TIMED UP AND GO:  Was the test performed? No .    Cognitive Function: MMSE - Mini Mental State Exam 09/28/2020 03/29/2018 01/24/2016  Orientation to time 5 3 5    Orientation to time comments 2022, Winter, 2/11, Friday, February - -  Orientation to Place 5 5 5   Orientation to Place-comments [place], [place], [place], [employer]. - -  Registration 3 3 3   Registration-comments DLOW - -  Attention/ Calculation 4 5 5   Recall 1 3 3   Language- name 2 objects 2 2 2   Language- repeat 1 1 1   Language- follow 3 step command 3 3 3   Language- read & follow direction 1 1 1   Write a sentence 1 1 1   Copy design 0 0 1  Total score 26 27 30      6CIT Screen 10/03/2021 09/27/2019  What Year? 0 points 4 points  What month? 0 points 0 points  What time? 0 points 0 points  Count back from 20 0 points 2 points  Months in reverse 0 points 0 points  Repeat phrase - 0 points  Total Score - 6    Immunizations Immunization History  Administered Date(s) Administered   DTaP 08/19/2007   Fluad Quad(high Dose 65+) 05/24/2019, 06/04/2020, 07/02/2021   Influenza, High Dose Seasonal PF 05/18/2017   Influenza,inj,Quad PF,6+ Mos 05/31/2013, 05/23/2014, 05/04/2015, 05/04/2015, 05/01/2016, 04/01/2018   Influenza-Unspecified 05/18/2010, 06/10/2011, 06/09/2012   Moderna Covid-19 Vaccine Bivalent Booster 78yrs & up 07/22/2021   Moderna Sars-Covid-2 Vaccination 10/11/2019, 11/17/2019, 10/03/2020   Pneumococcal Conjugate-13 01/22/2015   Pneumococcal Polysaccharide-23 08/19/2007   Tdap 03/03/2014   Zoster, Live 03/18/2013    TDAP status: Up to date  Flu Vaccine status: Up to date  Pneumococcal vaccine status: Up to date  Covid-19 vaccine status: Information provided on how to obtain vaccines.   Qualifies for Shingles Vaccine? Yes   Zostavax completed No   Shingrix Completed?: No.    Education has been provided regarding the importance of this vaccine. Patient has been advised to call insurance company to determine out of pocket expense if they have not yet received this vaccine. Advised may also receive vaccine at local pharmacy or Health Dept. Verbalized  acceptance and understanding.  Screening Tests Health Maintenance  Topic Date Due   FOOT EXAM  Never done   Zoster Vaccines- Shingrix (1 of 2) Never done   HEMOGLOBIN A1C  12/03/2020   OPHTHALMOLOGY EXAM  09/25/2021   TETANUS/TDAP  03/03/2024  Pneumonia Vaccine 72+ Years old  Completed   INFLUENZA VACCINE  Completed   DEXA SCAN  Completed   COVID-19 Vaccine  Completed   HPV VACCINES  Aged Out    Health Maintenance  Health Maintenance Due  Topic Date Due   FOOT EXAM  Never done   Zoster Vaccines- Shingrix (1 of 2) Never done   HEMOGLOBIN A1C  12/03/2020   OPHTHALMOLOGY EXAM  09/25/2021    Colorectal cancer screening: No longer required.   Mammogram status: No longer required due to age.  Declined bone density   Lung Cancer Screening: (Low Dose CT Chest recommended if Age 57-80 years, 30 pack-year currently smoking OR have quit w/in 15years.) does not qualify.   Lung Cancer Screening Referral: na  Additional Screening:  Hepatitis C Screening: does not qualify  Vision Screening: Recommended annual ophthalmology exams for early detection of glaucoma and other disorders of the eye. Is the patient up to date with their annual eye exam?  Yes  Who is the provider or what is the name of the office in which the patient attends annual eye exams? [facility]  If pt is not established with a provider, would they like to be referred to a provider to establish care? No .   Dental Screening: Recommended annual dental exams for proper oral hygiene  Community Resource Referral / Chronic Care Management: CRR required this visit?  No   CCM required this visit?  No      Plan:     I have personally reviewed and noted the following in the patients chart:   Medical and social history Use of alcohol, tobacco or illicit drugs  Current medications and supplements including opioid prescriptions.  Functional ability and status Nutritional status Physical activity Advanced  directives List of other physicians Hospitalizations, surgeries, and ER visits in previous 12 months Vitals Screenings to include cognitive, depression, and falls Referrals and appointments  In addition, I have reviewed and discussed with patient certain preventive protocols, quality metrics, and best practice recommendations. A written personalized care plan for preventive services as well as general preventive health recommendations were provided to patient.     Lauree Chandler, NP   10/03/2021    Virtual Visit via Telephone Note  I connected with patient 10/03/21 at  3:15 PM EST by telephone and verified that I am speaking with the correct person using two identifiers.  Location: Patient: home Provider: [facility]   I discussed the limitations, risks, security and privacy concerns of performing an evaluation and management service by telephone and the availability of in person appointments. I also discussed with the patient that there may be a patient responsible charge related to this service. The patient expressed understanding and agreed to proceed.   I discussed the assessment and treatment plan with the patient. The patient was provided an opportunity to ask questions and all were answered. The patient agreed with the plan and demonstrated an understanding of the instructions.   The patient was advised to call back or seek an in-person evaluation if the symptoms worsen or if the condition fails to improve as anticipated.  I provided 14 minutes of non-face-to-face time during this encounter.  Carlos American. Harle Battiest [employer] printed and mailed

## 2021-10-15 ENCOUNTER — Other Ambulatory Visit: Payer: Self-pay

## 2021-10-15 ENCOUNTER — Other Ambulatory Visit: Payer: No Typology Code available for payment source | Admitting: Hospice

## 2021-10-15 DIAGNOSIS — R2681 Unsteadiness on feet: Secondary | ICD-10-CM

## 2021-10-15 DIAGNOSIS — M545 Low back pain, unspecified: Secondary | ICD-10-CM

## 2021-10-15 DIAGNOSIS — F339 Major depressive disorder, recurrent, unspecified: Secondary | ICD-10-CM

## 2021-10-15 DIAGNOSIS — Z515 Encounter for palliative care: Secondary | ICD-10-CM

## 2021-10-15 DIAGNOSIS — E440 Moderate protein-calorie malnutrition: Secondary | ICD-10-CM

## 2021-10-15 NOTE — Progress Notes (Signed)
[facility] Consult Note Telephone: [phone]  Fax: [phone]  PATIENT NAME: [name] DOB: Aug 03, 1927 MRN: [id number]  PRIMARY CARE PROVIDER:   Wardell Honour, MD [name], [address] [place],  [place] [address]  REFERRING PROVIDER: Wardell Honour, MD [name], [place],  [place] [address]  RESPONSIBLE PARTY:  Self - [place] service coordinator [phone] office [phone] Contact Information     Name Relation Home Work Mobile   Christine, Carter   [phone]       Visit is to build trust and highlight Palliative Medicine as specialized medical care for people living with serious illness, aimed at facilitating better quality of life through symptoms relief, assisting with advance care planning and complex medical decision making.  This is a follow up visit.  RECOMMENDATIONS/PLAN:   Advance Care Planning/Code Status:Patient is a Do Not Resusucitate.  Goals of Care: Goals of care include to maximize quality of life and symptom management. Patient is interested in hospice service in the future when she qualifies for it.   Visit consisted of counseling and education dealing with the complex and emotionally intense issues of symptom management and palliative care in the setting of serious and potentially life-threatening illness. Palliative care team will continue to support patient, patient's family, and medical team.  Symptom management/Plan:  Protein Caloric Malnutrition. Current weight is 102 Ibs, weight loss of 3 Ib in the past 3 months. Continue with Boost BID,  4-6 small meals daily. She reports appetite started improving in the past week. Fu with PCP as planned Fall: no fall since last visit Nov 2022. Education on slow position changes and use of rolling walker for support. Fall precautions discussed. Depression: Managed with Zoloft.   Psych consult as  needed. Continue to participate socialization activities in the facility.  Low Back pain: Well managed with Diclofenac   Discussion with Facility Coordinator [name] to call for Meals on wheels for patients. She requested [facility] SW tel number for more resources that patient can benefit from.  Follow up: Palliative care will continue to follow for complex medical decision making, advance care planning, and clarification of goals. Return 6 weeks or prn.Encouraged to call provider sooner with any concerns.    Family/Caregiver/Community Supports: Patient lives in [place]; her son [name] lives in [place]. She participates in health and wellness programs and other socialization activities, per [employer] the Theme park manager.    HOSPICE ELIGIBILITY/DIAGNOSIS: TBD   Chief Complaint: Follow up visit   HISTORY OF PRESENT ILLNESS:  [name] is a 88 y.o. year old female  with multiple medical conditions including Protein caloric malnutrition with chronic weight loss. History of  arthritis, low back pain, hypertension, GERD fall. She denies pain/discomfort, no respiratory distress. She endorses  good appetite, no depressive mood. History obtained from review of EMR, discussion with primary team, family and/or patient. Records reviewed and summarized above. All 10 point systems reviewed and are negative except as documented in history of present illness above  Review and summarization of Epic records shows history from other than patient.   Palliative Care was asked to follow this patient o help address complex decision making in the context of advance care planning and goals of care clarification.   PHYSICAL EXAM  Height/Weight: 5 feet/102 Ibs Constitutional: NAD General: Well groomed, cooperative EYES: anicteric sclera, lids intact, no discharge  ENMT: Moist mucous membrane CV: S1 S2, RRR, no LE edema Pulmonary: LCTA, no increased work of breathing, no cough, Abdomen: active BS + 4 quadrants, soft and  non tender GU: no suprapubic tenderness MSK: weakness, sarcopenia, limited ROM, unsteady gait Skin: warm and dry, no rashes or wounds on visible skin Neuro:  weakness, otherwise non focal Psych: non-anxious affect Hem/lymph/immuno: no widespread bruising  PERTINENT MEDICATIONS:  Outpatient Encounter Medications as of 10/15/2021  Medication Sig   BIOTIN PO Take by mouth daily.   brimonidine (ALPHAGAN) 0.15 % ophthalmic solution INT 1 GTT INTO OU BID   cholecalciferol (VITAMIN D) 1000 UNITS tablet Take 1,000 Units by mouth 2 (two) times daily.    diclofenac sodium (VOLTAREN) 1 % GEL Apply 2 g topically 4 (four) times daily.   dorzolamide-timolol (COSOPT) 22.3-6.8 MG/ML ophthalmic solution Place 1 drop into both eyes daily.   latanoprost (XALATAN) 0.005 % ophthalmic solution Place 1 drop into both eyes at bedtime.    lisinopril (ZESTRIL) 20 MG tablet Take 1 tablet (20 mg total) by mouth daily.   Menaquinone-7 (VITAMIN K2 PO) Take 1 capsule by mouth daily.   omeprazole (PRILOSEC) 20 MG capsule Take 1 capsule (20 mg total) by mouth daily.   sertraline (ZOLOFT) 100 MG tablet Take 1.5 tablets (150 mg total) by mouth daily.   No facility-administered encounter medications on file as of 10/15/2021.    HOSPICE ELIGIBILITY/DIAGNOSIS: TBD  PAST MEDICAL HISTORY:  Past Medical History:  Diagnosis Date   Arthritis    Osteoarthritis   Depression    GERD (gastroesophageal reflux disease)    Hyperlipidemia    Osteopenia       ALLERGIES:  Allergies  Allergen Reactions   Penicillins Rash      I spent 50 minutes providing this consultation; this includes time spent with patient/family, chart review and documentation. More than 50% of the time in this consultation was spent on counseling and coordinating communication   Thank you for the opportunity to participate in the care of [name] Please call our office at [phone] if we can be of additional assistance.  Note: Portions of this  note were generated with [employer]. Dictation errors may occur despite best attempts at proofreading.  Christine Spray, NP

## 2021-12-09 ENCOUNTER — Other Ambulatory Visit: Payer: No Typology Code available for payment source | Admitting: Hospice

## 2021-12-09 DIAGNOSIS — F339 Major depressive disorder, recurrent, unspecified: Secondary | ICD-10-CM

## 2021-12-09 DIAGNOSIS — R3981 Functional urinary incontinence: Secondary | ICD-10-CM

## 2021-12-09 DIAGNOSIS — E440 Moderate protein-calorie malnutrition: Secondary | ICD-10-CM

## 2021-12-09 DIAGNOSIS — G8929 Other chronic pain: Secondary | ICD-10-CM

## 2021-12-09 NOTE — Progress Notes (Signed)
? ? ?Manufacturing engineer ?[facility] Consult Note ?Telephone: [phone]  ?Fax: [phone] ? ?PATIENT NAME: [name] ?DOB: 1926-12-03 ?MRN: [id number] ? ?PRIMARY CARE PROVIDER:   Wardell Honour, MD ?Wardell Honour, MD ?[address] ?[place],  [place] [address] ? ?REFERRING PROVIDER: Wardell Honour, MD ?Wardell Honour, MD ?[address] ?[place],  [place] [address] ? ?RESPONSIBLE PARTY:  Self - [phone] ?Loletha Carrow - facility service coordinator [phone] office [phone] ?Contact Information   ? ? Name Relation Home Work Mobile  ? Hyacinth, Marcelli   [phone]  ? ?  ? ? ?Visit is to build trust and highlight Palliative Medicine as specialized medical care for people living with serious illness, aimed at facilitating better quality of life through symptoms relief, assisting with advance care planning and complex medical decision making.  This is a follow up visit. ? ?RECOMMENDATIONS/PLAN:  ? ?Advance Care Planning/Code Status:Patient is a Do Not Resusucitate. ? ?Goals of Care: Goals of care include to maximize quality of life and symptom management. Patient is interested in hospice service in the future when she qualifies for it.  ? ?Visit consisted of counseling and education dealing with the complex and emotionally intense issues of symptom management and palliative care in the setting of serious and potentially life-threatening illness. Palliative care team will continue to support patient, patient's family, and medical team. ? ?Symptom management/Plan:  ?Protein Caloric Malnutrition. Current weight is 104 Ibs from 102 Ibs last month. She reports improving appetite. Continue with Boost BID,  4-6 small meals daily. She reports appetite started improving Fall: no fall since last visit Nov 2022. Education on slow position changes and use of rolling walker for support. Fall precautions discussed. ?Depression: Managed with Zoloft.   Psych consult as needed. Continue to participate  socialization activities in the facility.  ?Low Back pain: Well managed with Diclofenac  ?  ?Follow up: Palliative care will continue to follow for complex medical decision making, advance care planning, and clarification of goals. Return 6 weeks or prn.Encouraged to call provider sooner with any concerns.  ?  ?Family/Caregiver/Community Supports: Patient lives in [place]; her son [name] lives in [place]. She participates in health and wellness programs and other socialization activities, per [employer] the Theme [employer] manager. [name] to follow up on meals on wheels for patient.  ?  ?HOSPICE ELIGIBILITY/DIAGNOSIS: TBD ?  ?Chief Complaint: Follow up visit ?  ?HISTORY OF PRESENT ILLNESS:  [name] is a 88 y.o. year old female  with multiple medical conditions including Protein caloric malnutrition with chronic weight loss. History of  arthritis, low back pain, hypertension, GERD fall. She denies pain/discomfort, no respiratory distress. She endorses  good appetite, no depressive mood; endorses urinary incontinence while sleeping and mostly due to mobility/function limitation- ongoing for about 2 years. Encouraged limiting fluid after 6 pm, Kegel exercises and use of rolling walker to bathroom as fast as she can.  Patient denies urinary symptoms of frequency burning, no memory changes/confusion, no fever or chills.  History obtained from review of EMR, discussion with primary team, family and/or patient. Records reviewed and summarized above. All 10 point systems reviewed and are negative except as documented in history of present illness above ? ?Review and summarization of Epic records shows history from other than patient.  ? ?Palliative Care was asked to follow this patient o help address complex decision making in the context of advance care planning and goals of  care clarification.  ? ? ? ?PERTINENT MEDICATIONS:  ?Outpatient Encounter Medications as of 12/09/2021  ?Medication Sig  ? BIOTIN PO Take by mouth daily.   ? brimonidine (ALPHAGAN) 0.15 % ophthalmic solution INT 1 GTT INTO OU BID  ? cholecalciferol (VITAMIN D) 1000 UNITS tablet Take 1,000 Units by mouth 2 (two) times daily.   ? diclofenac sodium (VOLTAREN) 1 % GEL Apply 2 g topically 4 (four) times daily.  ? dorzolamide-timolol (COSOPT) 22.3-6.8 MG/ML ophthalmic solution Place 1 drop into both eyes daily.  ? latanoprost (XALATAN) 0.005 % ophthalmic solution Place 1 drop into both eyes at bedtime.   ? lisinopril (ZESTRIL) 20 MG tablet Take 1 tablet (20 mg total) by mouth daily.  ? Menaquinone-7 (VITAMIN K2 PO) Take 1 capsule by mouth daily.  ? omeprazole (PRILOSEC) 20 MG capsule Take 1 capsule (20 mg total) by mouth daily.  ? sertraline (ZOLOFT) 100 MG tablet Take 1.5 tablets (150 mg total) by mouth daily.  ? ?No facility-administered encounter medications on file as of 12/09/2021.  ? ? ?HOSPICE ELIGIBILITY/DIAGNOSIS: TBD ? ?PAST MEDICAL HISTORY:  ?Past Medical History:  ?Diagnosis Date  ? Arthritis   ? Osteoarthritis  ? Depression   ? GERD (gastroesophageal reflux disease)   ? Hyperlipidemia   ? Osteopenia   ?  ? ? ?ALLERGIES:  ?Allergies  ?Allergen Reactions  ? Penicillins Rash  ?   ? ?I spent 40 minutes providing this consultation; this includes time spent with patient/family, chart review and documentation. More than 50% of the time in this consultation was spent on counseling and coordinating communication  ? ?Thank you for the opportunity to participate in the care of [name] Please call our office at [phone] if we can be of additional assistance. ? ?Note: Portions of this note were generated with Lobbyist. Dictation errors may occur despite best attempts at proofreading. ? ?Teodoro Spray, NP ? ?  ?

## 2021-12-25 DIAGNOSIS — R69 Illness, unspecified: Secondary | ICD-10-CM | POA: Diagnosis not present

## 2021-12-31 ENCOUNTER — Telehealth: Payer: Self-pay

## 2021-12-31 NOTE — Telephone Encounter (Signed)
Message left on clinical intake voicemail:  ? ?Patient is requesting refills on Pantoprazole and Linzess (neither medication is on her active medication list). ? ?Call returned to patient, left message on voicemail requesting that she return call to schedule an appointment to further discuss via in person, telephone, or video visit  ?

## 2022-01-02 NOTE — Telephone Encounter (Signed)
I left another detailed message informing patient to call and schedule an appointment to discuss request

## 2022-01-10 DIAGNOSIS — R69 Illness, unspecified: Secondary | ICD-10-CM | POA: Diagnosis not present

## 2022-01-14 ENCOUNTER — Ambulatory Visit (INDEPENDENT_AMBULATORY_CARE_PROVIDER_SITE_OTHER): Payer: No Typology Code available for payment source | Admitting: Family Medicine

## 2022-01-14 ENCOUNTER — Encounter: Payer: Self-pay | Admitting: Family Medicine

## 2022-01-14 ENCOUNTER — Telehealth: Payer: Self-pay | Admitting: *Deleted

## 2022-01-14 VITALS — BP 104/62 | HR 71 | Temp 97.5°F | Resp 20 | Ht 61.0 in | Wt 109.0 lb

## 2022-01-14 DIAGNOSIS — I10 Essential (primary) hypertension: Secondary | ICD-10-CM | POA: Diagnosis not present

## 2022-01-14 DIAGNOSIS — E785 Hyperlipidemia, unspecified: Secondary | ICD-10-CM | POA: Diagnosis not present

## 2022-01-14 DIAGNOSIS — F3341 Major depressive disorder, recurrent, in partial remission: Secondary | ICD-10-CM | POA: Diagnosis not present

## 2022-01-14 DIAGNOSIS — R69 Illness, unspecified: Secondary | ICD-10-CM | POA: Diagnosis not present

## 2022-01-14 DIAGNOSIS — L659 Nonscarring hair loss, unspecified: Secondary | ICD-10-CM

## 2022-01-14 DIAGNOSIS — E1169 Type 2 diabetes mellitus with other specified complication: Secondary | ICD-10-CM | POA: Diagnosis not present

## 2022-01-14 NOTE — Telephone Encounter (Signed)
Dr. Sabra Heck requested PCS Form to be filled out per patient's request.   Printed form, filled out and Dr. Sabra Heck signed.  Faxed to [facility] Fax: [phone] Sent copy to scanning.

## 2022-01-14 NOTE — Progress Notes (Signed)
Provider:  Alain Honey, MD  Careteam: Patient Care Team: Wardell Honour, MD as PCP - General (Family Medicine) Warden Fillers, MD as Consulting Physician (Ophthalmology) Griselda Miner, MD as Consulting Physician (Dermatology)  PLACE OF SERVICE:  [facility] Directive information Does Patient Have a Medical Advance Directive?: Yes, Type of Advance Directive: [place], Does patient want to make changes to medical advance directive?: No - Patient declined  Allergies  Allergen Reactions   Penicillins Rash    Chief Complaint  Patient presents with   Medical Management of Chronic Issues    Patient is here for a follow up for chronic conditions, A1C, foot and eye exam needed as well      HPI: Patient is a 88 y.o. female patient presents today to follow-up chronic medical problems including depression, hypertension, and GERD. Blood pressures have been well controlled on 20 mg of lisinopril.  Given her age and size I suspect 10 mg of lisinopril would be a sufficient dose. Her weight is up 2 pounds from last visit to 109. She reports sleeping well.  She uses her walker for ambulation.  She is requesting home health aide to help around her apartment as she is having increasing difficulties with housework and mobility.  We will make that referral today She has no other new complaints except for some hair loss.  This is likely age-related but will check thyroid to be sure she is not hypothyroid Review of Systems:  Review of Systems  Constitutional:  Positive for malaise/fatigue.  Respiratory: Negative.    Cardiovascular: Negative.   Gastrointestinal:  Positive for diarrhea.  Genitourinary: Negative.   All other systems reviewed and are negative.  Past Medical History:  Diagnosis Date   Arthritis    Osteoarthritis   Depression    GERD (gastroesophageal reflux disease)    Hyperlipidemia    Osteopenia    Past Surgical History:   Procedure Laterality Date   APPENDECTOMY  1936   CERVICAL FUSION  2004   COLON SURGERY  06/12/2010   Colonoscopy   FINGER SURGERY  1989   MELANOMA EXCISION  11/16/2017   forehead   SPINE SURGERY  2009   Back Fusion   TONSILLECTOMY  1941   Social History:   reports that she has quit smoking. She has never used smokeless tobacco. She reports that she does not currently use alcohol. She reports that she does not use drugs.  History reviewed. No pertinent family history.  Medications: Patient's Medications  New Prescriptions   No medications on file  Previous Medications   BIOTIN PO    Take by mouth daily.   BRIMONIDINE (ALPHAGAN) 0.15 % OPHTHALMIC SOLUTION    INT 1 GTT INTO OU BID   CHOLECALCIFEROL (VITAMIN D) 1000 UNITS TABLET    Take 1,000 Units by mouth 2 (two) times daily.    DICLOFENAC SODIUM (VOLTAREN) 1 % GEL    Apply 2 g topically 4 (four) times daily.   DORZOLAMIDE-TIMOLOL (COSOPT) 22.3-6.8 MG/ML OPHTHALMIC SOLUTION    Place 1 drop into both eyes daily.   LATANOPROST (XALATAN) 0.005 % OPHTHALMIC SOLUTION    Place 1 drop into both eyes at bedtime.    LISINOPRIL (ZESTRIL) 20 MG TABLET    Take 1 tablet (20 mg total) by mouth daily.   MENAQUINONE-7 (VITAMIN K2 PO)    Take 1 capsule by mouth daily.   OMEPRAZOLE (PRILOSEC) 20 MG CAPSULE    Take 1 capsule (20 mg  total) by mouth daily.   SERTRALINE (ZOLOFT) 100 MG TABLET    Take 1.5 tablets (150 mg total) by mouth daily.  Modified Medications   No medications on file  Discontinued Medications   No medications on file    Physical Exam:  Vitals:   01/14/22 1409  BP: 104/62  Pulse: 71  Resp: 20  Temp: (!) 97.5 F (36.4 C)  TempSrc: Tympanic  SpO2: 92%  Weight: 109 lb (49.4 kg)  Height: '5\' 1"'$  (1.549 m)   Body mass index is 20.6 kg/m. Wt Readings from Last 3 Encounters:  01/14/22 109 lb (49.4 kg)  07/02/21 107 lb 6.4 oz (48.7 kg)  05/14/21 105 lb 12.8 oz (48 kg)    Physical Exam Vitals and nursing note  reviewed.  Constitutional:      Appearance: Normal appearance.  Cardiovascular:     Rate and Rhythm: Normal rate and regular rhythm.  Pulmonary:     Effort: Pulmonary effort is normal.     Breath sounds: Normal breath sounds.  Musculoskeletal:     Comments: Uses walker for ambulation and does well with that.  Neurological:     General: No focal deficit present.     Mental Status: She is alert and oriented to person, place, and time.    Labs reviewed: Basic Metabolic Panel: No results for input(s): NA, K, CL, CO2, GLUCOSE, BUN, CREATININE, CALCIUM, MG, PHOS, TSH in the last 8760 hours. Liver Function Tests: No results for input(s): AST, ALT, ALKPHOS, BILITOT, PROT, ALBUMIN in the last 8760 hours. No results for input(s): LIPASE, AMYLASE in the last 8760 hours. No results for input(s): AMMONIA in the last 8760 hours. CBC: No results for input(s): WBC, NEUTROABS, HGB, HCT, MCV, PLT in the last 8760 hours. Lipid Panel: No results for input(s): CHOL, HDL, LDLCALC, TRIG, CHOLHDL, LDLDIRECT in the last 8760 hours. TSH: No results for input(s): TSH in the last 8760 hours. A1C: Lab Results  Component Value Date   HGBA1C 5.6 06/04/2020     Assessment/Plan  1. Hair loss No particular pattern and no breakage of hairs.  Suspect age-related but will check TSH  2. Essential hypertension, benign I have suggested patient decrease her lisinopril from 20 to 10 mg and we will follow  3. Hyperlipidemia associated with type 2 diabetes mellitus (HCC) LDL cholesterol was elevated at 143 when last checked.  Patient does not want any more blood work or would not like to start cholesterol medicine at this point in life  4. Recurrent major depressive disorder, in partial remission ([place]) Sertraline was started when her daughter died 2 years ago.  Since her last visit she has decreased dose from 150 mg to 100 mg and sees no difference.  Might be worth trying to taper her off of that going  forward   Alain Honey, MD [facility] [phone]

## 2022-01-15 DIAGNOSIS — R69 Illness, unspecified: Secondary | ICD-10-CM | POA: Diagnosis not present

## 2022-01-15 LAB — TSH: TSH: 0.33 mIU/L — ABNORMAL LOW (ref 0.40–4.50)

## 2022-01-29 DIAGNOSIS — R69 Illness, unspecified: Secondary | ICD-10-CM | POA: Diagnosis not present

## 2022-01-30 DIAGNOSIS — R69 Illness, unspecified: Secondary | ICD-10-CM | POA: Diagnosis not present

## 2022-02-14 ENCOUNTER — Other Ambulatory Visit: Payer: No Typology Code available for payment source | Admitting: Hospice

## 2022-02-14 DIAGNOSIS — R3981 Functional urinary incontinence: Secondary | ICD-10-CM

## 2022-02-14 DIAGNOSIS — Z515 Encounter for palliative care: Secondary | ICD-10-CM

## 2022-02-14 DIAGNOSIS — G8929 Other chronic pain: Secondary | ICD-10-CM

## 2022-02-14 DIAGNOSIS — M545 Low back pain, unspecified: Secondary | ICD-10-CM

## 2022-02-14 NOTE — Progress Notes (Signed)
[facility] Consult Note Telephone: [phone]  Fax: [phone]  PATIENT NAME: [name] DOB: [birth year] MRN: [id number]  PRIMARY CARE PROVIDER:   Wardell Honour, MD [name], [place],  [place] [address]  REFERRING PROVIDER: Wardell Honour, MD [name], [place],  [place] [address]  RESPONSIBLE PARTY:  Self - [place] service coordinator [phone] office [phone] Contact Information     Name Relation Home Work Mobile   [name]   [phone]       Visit is to build trust and highlight Palliative Medicine as specialized medical care for people living with serious illness, aimed at facilitating better quality of life through symptoms relief, assisting with advance care planning and complex medical decision making.  This is a follow up visit.  RECOMMENDATIONS/PLAN:   Advance Care Planning/Code Status:Patient is a Do Not Resusucitate.  Goals of Care: Goals of care include to maximize quality of life and symptom management. Patient is interested in hospice service in the future when she qualifies for it.   Visit consisted of counseling and education dealing with the complex and emotionally intense issues of symptom management and palliative care in the setting of serious and potentially life-threatening illness. Palliative care team will continue to support patient, patient's family, and medical team.  Symptom management/Plan:  Urinary incontinence, functional: Improved with kegel exercises. Continue Kegel exercise and participation in facility restorative/wellness program.  Hypertension: Stable. Continue Lisinopril. BP today 110/68 Protein Caloric Malnutrition. Current weight is 109 Ibs from 102 Ibs April '23. Height 5 feet 1 inch. She reports improving appetite. Continue with Boost BID,  4-6 small meals daily. Routine CBC CMP. Fall: no fall  since last visit Nov 2022. Education on slow position changes and use of rolling walker for support. Fall precautions discussed. Depression: Stable. Managed with Zoloft.   Psych consult as needed.  Encourage socialization. Low Back pain: Related to Osteoarthritis. Well managed with Diclofenac    Follow up: Palliative care will continue to follow for complex medical decision making, advance care planning, and clarification of goals. Return 6 weeks or prn.Encouraged to call provider sooner with any concerns.    Family/Caregiver/Community Supports: Patient lives in [place]; her son [name] lives in [place]. She participates in health and wellness programs and other socialization activities,in the independent living facility. PCP's office had put in referral for PCS for patient. She will call to follow up.   HOSPICE ELIGIBILITY/DIAGNOSIS: TBD   Chief Complaint: Follow up visit   HISTORY OF PRESENT ILLNESS:  [name] is a 88 y.o. year old female  with multiple medical conditions including Protein caloric malnutrition with chronic weight loss. History of  arthritis, low back pain, hypertension, GERD fall. She denies pain/discomfort, no respiratory distress. She endorses  good appetite, no depressive mood; endorses urinary incontinence while sleeping and mostly due to mobility/function limitation- ongoing for about 2 years. Encouraged limiting fluid after 6 pm, Kegel exercises and use of rolling walker to bathroom as fast as she can.  Patient denies urinary symptoms of frequency burning, no memory changes/confusion, no fever or chills.  History obtained from review of EMR, discussion with primary team, family and/or patient. Records reviewed and summarized above. All 10 point systems reviewed and are negative except as documented in history of present illness above  Review and summarization of Epic records shows history from  other than patient.   Palliative Care was asked to follow this patient o help  address complex decision making in the context of advance care planning and goals of care clarification.     PERTINENT MEDICATIONS:  Outpatient Encounter Medications as of 02/14/2022  Medication Sig   BIOTIN PO Take by mouth daily.   brimonidine (ALPHAGAN) 0.15 % ophthalmic solution INT 1 GTT INTO OU BID   cholecalciferol (VITAMIN D) 1000 UNITS tablet Take 1,000 Units by mouth 2 (two) times daily.    diclofenac sodium (VOLTAREN) 1 % GEL Apply 2 g topically 4 (four) times daily.   dorzolamide-timolol (COSOPT) 22.3-6.8 MG/ML ophthalmic solution Place 1 drop into both eyes daily.   latanoprost (XALATAN) 0.005 % ophthalmic solution Place 1 drop into both eyes at bedtime.    lisinopril (ZESTRIL) 20 MG tablet Take 1 tablet (20 mg total) by mouth daily.   Menaquinone-7 (VITAMIN K2 PO) Take 1 capsule by mouth daily.   omeprazole (PRILOSEC) 20 MG capsule Take 1 capsule (20 mg total) by mouth daily.   sertraline (ZOLOFT) 100 MG tablet Take 1.5 tablets (150 mg total) by mouth daily.   No facility-administered encounter medications on file as of 02/14/2022.    HOSPICE ELIGIBILITY/DIAGNOSIS: TBD  PAST MEDICAL HISTORY:  Past Medical History:  Diagnosis Date   Arthritis    Osteoarthritis   Depression    GERD (gastroesophageal reflux disease)    Hyperlipidemia    Osteopenia       ALLERGIES:  Allergies  Allergen Reactions   Penicillins Rash      I spent 60 minutes providing this consultation; this includes time spent with patient/family, chart review and documentation. More than 50% of the time in this consultation was spent on counseling and coordinating communication   Thank you for the opportunity to participate in the care of [name] Please call our office at [phone] if we can be of additional assistance.  Note: Portions of this note were generated with Lobbyist. Dictation errors may occur despite best attempts at proofreading.  Teodoro Spray, NP

## 2022-02-17 ENCOUNTER — Telehealth: Payer: Self-pay

## 2022-02-17 NOTE — Telephone Encounter (Signed)
Patient called checking on status of home health referral. She was advised that a referral was not placed and states that [facility] was going to do one back in [id number] for her. She wanted a message to send to Dr.Miller about the referral and see if he could place the referral for home health to come out for her. [name] the service coordinator states that patients insurance does cover home health aids to come out. Patient she aware Dr.Miller is out the office until 02/19/22 and states she still want a message send to him only.

## 2022-02-19 ENCOUNTER — Telehealth: Payer: Self-pay

## 2022-02-19 DIAGNOSIS — M159 Polyosteoarthritis, unspecified: Secondary | ICD-10-CM

## 2022-02-19 NOTE — Telephone Encounter (Signed)
Patient called and stated the order that was placed in May,2023 was not approved because she doesn't have medicaid. She stated Medicare stated the order would have to be placed as home health. Order was placed for pending for Dr.Miller to review and sign off on.

## 2022-02-20 DIAGNOSIS — R69 Illness, unspecified: Secondary | ICD-10-CM | POA: Diagnosis not present

## 2022-02-21 DIAGNOSIS — R69 Illness, unspecified: Secondary | ICD-10-CM | POA: Diagnosis not present

## 2022-03-03 ENCOUNTER — Other Ambulatory Visit: Payer: No Typology Code available for payment source | Admitting: Hospice

## 2022-03-03 DIAGNOSIS — Z515 Encounter for palliative care: Secondary | ICD-10-CM

## 2022-03-03 DIAGNOSIS — E440 Moderate protein-calorie malnutrition: Secondary | ICD-10-CM

## 2022-03-03 DIAGNOSIS — R3981 Functional urinary incontinence: Secondary | ICD-10-CM

## 2022-03-03 DIAGNOSIS — F339 Major depressive disorder, recurrent, unspecified: Secondary | ICD-10-CM

## 2022-03-03 DIAGNOSIS — W19XXXD Unspecified fall, subsequent encounter: Secondary | ICD-10-CM

## 2022-03-03 DIAGNOSIS — G8929 Other chronic pain: Secondary | ICD-10-CM

## 2022-03-03 NOTE — Progress Notes (Signed)
[facility] Consult Note Telephone: [phone]  Fax: [phone]  PATIENT NAME: [name] DOB: [birth year] MRN: [id number]  PRIMARY CARE PROVIDER:   Wardell Honour, MD Wardell Honour, [place],  [place] [address]  REFERRING PROVIDER: Wardell Honour, MD Wardell Honour, [place],  [place] [address]  RESPONSIBLE PARTY:  Self - [place] service coordinator [phone] office [phone] Contact Information     Name Relation Home Work Mobile   Reah, Justo   [phone]       Visit is to build trust and highlight Palliative Medicine as specialized medical care for people living with serious illness, aimed at facilitating better quality of life through symptoms relief, assisting with advance care planning and complex medical decision making.  This is a follow up visit.Facility Coordinator [name] is with patient during visit.   RECOMMENDATIONS/PLAN:   Advance Care Planning/Code Status:Patient is a Do Not Resusucitate.  Goals of Care: Goals of care include to maximize quality of life and symptom management. Patient is interested in hospice service in the future when she qualifies for it.   Visit consisted of counseling and education dealing with the complex and emotionally intense issues of symptom management and palliative care in the setting of serious and potentially life-threatening illness. Palliative care team will continue to support patient, patient's family, and medical team.  Symptom management/Plan:  Fall: Recent fall 2 weeks ago, no injuries. High fall risk.  Education on slow position changes and use of rolling walker for support. Fall precautions discussed. Recommendation: Home Health PT/OT for strengthening and gait training, nursing aid for assistance with ADLs.  Patient reports home health order was put in by PCP and wanted to know when services would likely  start.  NP sent secure message to PCP and also called his office and left voicemail with callback number. Depression: Stable. Managed with Zoloft.   Psych consult as needed.  Encourage socialization. Urinary incontinence, functional: Improved with kegel exercises. Continue Kegel exercise and participation in facility restorative/wellness program.  Hypertension: Stable. Continue Lisinopril. BP today 120/88 Protein Caloric Malnutrition. Current weight is 109 Ibs up from 102 Ibs April '23. Height 5 feet 1 inch. She reports improving appetite. Continue with Boost BID,  4-6 small meals daily. Routine CBC CMP. Low Back pain: Related to Osteoarthritis. Well managed with Diclofenac    Follow up: Palliative care will continue to follow for complex medical decision making, advance care planning, and clarification of goals. Return 6 weeks or prn.Encouraged to call provider sooner with any concerns.    Family/Caregiver/Community Supports: Patient lives in [place]; her son [name] lives in [place]. She participates in health and wellness programs and other socialization activities in the independent living facility. PCP's office had put in referral for PCS for patient.  HOSPICE ELIGIBILITY/DIAGNOSIS: TBD   Chief Complaint: Follow up visit   HISTORY OF PRESENT ILLNESS:  [name] is a 88 y.o. year old female  with multiple medical conditions including Fall secondary to gait disturbance, Protein caloric malnutrition, weight loss. History of  arthritis, low back pain, hypertension, GERD. She denies pain/discomfort, no respiratory distress.  History obtained from review of EMR, discussion with primary team, family and/or patient. Records reviewed and summarized above. All 10 point systems reviewed and are negative except as documented in history of present illness above  Review and summarization of Epic records  shows history from other than patient.   Palliative Care was asked to follow this patient o help  address complex decision making in the context of advance care planning and goals of care clarification.     PERTINENT MEDICATIONS:  Outpatient Encounter Medications as of 03/03/2022  Medication Sig   BIOTIN PO Take by mouth daily.   brimonidine (ALPHAGAN) 0.15 % ophthalmic solution INT 1 GTT INTO OU BID   cholecalciferol (VITAMIN D) 1000 UNITS tablet Take 1,000 Units by mouth 2 (two) times daily.    diclofenac sodium (VOLTAREN) 1 % GEL Apply 2 g topically 4 (four) times daily.   dorzolamide-timolol (COSOPT) 22.3-6.8 MG/ML ophthalmic solution Place 1 drop into both eyes daily.   latanoprost (XALATAN) 0.005 % ophthalmic solution Place 1 drop into both eyes at bedtime.    lisinopril (ZESTRIL) 20 MG tablet Take 1 tablet (20 mg total) by mouth daily.   Menaquinone-7 (VITAMIN K2 PO) Take 1 capsule by mouth daily.   omeprazole (PRILOSEC) 20 MG capsule Take 1 capsule (20 mg total) by mouth daily.   sertraline (ZOLOFT) 100 MG tablet Take 1.5 tablets (150 mg total) by mouth daily.   No facility-administered encounter medications on file as of 03/03/2022.    HOSPICE ELIGIBILITY/DIAGNOSIS: TBD  PAST MEDICAL HISTORY:  Past Medical History:  Diagnosis Date   Arthritis    Osteoarthritis   Depression    GERD (gastroesophageal reflux disease)    Hyperlipidemia    Osteopenia       ALLERGIES:  Allergies  Allergen Reactions   Penicillins Rash      I spent 60 minutes providing this consultation; this includes time spent with patient/family, chart review and documentation. More than 50% of the time in this consultation was spent on counseling and coordinating communication   Thank you for the opportunity to participate in the care of [name] Please call our office at [phone] if we can be of additional assistance.  Note: Portions of this note were generated with Lobbyist. Dictation errors may occur despite best attempts at proofreading.  Teodoro Spray, NP

## 2022-03-07 ENCOUNTER — Telehealth: Payer: No Typology Code available for payment source

## 2022-03-07 NOTE — Telephone Encounter (Signed)
Fax number [phone]

## 2022-03-07 NOTE — Telephone Encounter (Signed)
Patient called and states that she needs letter written for apartment "[facility]". Stating that patient needs her over the counter medication, and incontinence supplies. She states that facility takes off some money off rent due to medications, etc. Letter will need to be faxed to [place]. I called patient and left voicemail for her to give office a call back and provide fax number. Message routed to PCP Sabra Heck Lillette Boxer, MD

## 2022-03-10 ENCOUNTER — Encounter: Payer: Self-pay | Admitting: Family Medicine

## 2022-03-10 NOTE — Telephone Encounter (Signed)
Patient needed letter faxed today due to Otila Kluver going on leave.  Copied previous letter from 2022 and faxed to [place] at [phone]

## 2022-03-18 ENCOUNTER — Other Ambulatory Visit: Payer: Self-pay | Admitting: Family Medicine

## 2022-03-18 DIAGNOSIS — I10 Essential (primary) hypertension: Secondary | ICD-10-CM

## 2022-03-26 DIAGNOSIS — R69 Illness, unspecified: Secondary | ICD-10-CM | POA: Diagnosis not present

## 2022-03-27 DIAGNOSIS — R69 Illness, unspecified: Secondary | ICD-10-CM | POA: Diagnosis not present

## 2022-04-11 ENCOUNTER — Telehealth: Payer: Self-pay | Admitting: *Deleted

## 2022-04-11 NOTE — Telephone Encounter (Signed)
Received email information to fill out for referral for [facility] from [facility] [phone]  Filled out and placed in Dr. Ammie Ferrier folder to review. To be faxed once completed to              Fax: [phone]

## 2022-04-11 NOTE — Telephone Encounter (Signed)
Nadine with [facility] [phone] called and stated that patient is requesting their services and in order for insurance to cover they need a referral with specific information.   Nadine with [facility] is going to email the information needed and once completed we are to fax it back to her.   Awaiting email.

## 2022-04-14 ENCOUNTER — Other Ambulatory Visit: Payer: No Typology Code available for payment source | Admitting: Hospice

## 2022-04-14 DIAGNOSIS — F339 Major depressive disorder, recurrent, unspecified: Secondary | ICD-10-CM

## 2022-04-14 DIAGNOSIS — W19XXXD Unspecified fall, subsequent encounter: Secondary | ICD-10-CM

## 2022-04-14 DIAGNOSIS — Z515 Encounter for palliative care: Secondary | ICD-10-CM

## 2022-04-14 DIAGNOSIS — E440 Moderate protein-calorie malnutrition: Secondary | ICD-10-CM

## 2022-04-14 DIAGNOSIS — R3981 Functional urinary incontinence: Secondary | ICD-10-CM

## 2022-04-14 NOTE — Progress Notes (Signed)
[facility] Consult Note Telephone: [phone]  Fax: [phone]  PATIENT NAME: [name] DOB: 10/14/1926 MRN: [id number]  PRIMARY CARE PROVIDER:   Wardell Honour, MD Wardell Honour, [place],  [place] [address]  REFERRING PROVIDER: Wardell Honour, MD Wardell Honour, [place],  [place] [address]  RESPONSIBLE PARTY:  Self [phone] [place] service coordinator [phone] office [phone] Contact Information     Name Relation Home Work Mobile   [place] Son   [phone]      TELEHEALTH VISIT STATEMENT Due to the COVID-19 crisis, this visit was done via telemedicine from my office and it was initiated and consent by this patient and or family.  I connected with patient OR PROXY by a telephone/video  and verified that I am speaking with the correct person. I discussed the limitations of evaluation and management by telemedicine. Patient/proxy expressed understanding and agreed to proceed. Palliative Care was asked to follow this patient to address advance care planning, complex medical decision making and goals of care clarification.    Visit consisted of counseling and education dealing with the complex and emotionally intense issues of symptom management and palliative care in the setting of serious and potentially life-threatening illness. Palliative care team will continue to support patient, patient's family, and medical team.  RECOMMENDATIONS/PLAN:   Advance Care Planning/Code Status:Patient is a Do Not Resusucitate.  Goals of Care: Goals of care include to maximize quality of life and symptom management. Patient is interested in hospice service in the future when she qualifies for it.   Visit consisted of counseling and education dealing with the complex and emotionally intense issues of symptom management and palliative care in the setting of serious and potentially  life-threatening illness. Palliative care team will continue to support patient, patient's family, and medical team.  Symptom management/Plan:  Fall: Recent fall related to unsteady gait; no injuries. High fall risk.  Education on slow position changes. Use rolling walker for support. Fall precautions reiterated.  Recommendation: Home Health PT/OT for strengthening and gait training, nursing aid for assistance with ADLs. Patient reports home health order was put in by PCP. ACC SW and RN to follow up with PCP office.   Depression: Stable. Continue Zoloft.   Psych consult as needed.  Encourage socialization.  Urinary incontinence, functional: Improved with kegel exercises. Continue Kegel exercise and participation in facility restorative/wellness program.   Protein Caloric Malnutrition. Current weight is 109 Ibs up from 102 Ibs April '23. Height 5 feet 1 inch. She reports improving appetite. Continue with Boost BID,  4-6 small meals daily. Routine CBC CMP. Low Back pain: Related to Osteoarthritis. Well managed with Diclofenac    Follow up: Palliative care will continue to follow for complex medical decision making, advance care planning, and clarification of goals. Return 6 weeks or prn.Encouraged to call provider sooner with any concerns.    Family/Caregiver/Community Supports: Patient lives in [place]; her son [name] lives in [place]. She participates in health and wellness programs and other socialization activities in the independent living facility.  HOSPICE ELIGIBILITY/DIAGNOSIS: TBD   Chief Complaint: Follow up visit   HISTORY OF PRESENT ILLNESS:  [name] is a 88 y.o. year old female  with multiple medical conditions including Fall secondary to gait disturbance, Protein caloric malnutrition, weight loss. History of  arthritis, low back pain, hypertension, GERD. She denies  pain/discomfort, no respiratory distress.  History obtained from review of EMR, discussion with primary team,  family and/or patient. Records reviewed and summarized above. All 10 point systems reviewed and are negative except as documented in history of present illness above  Review and summarization of Epic records shows history from other than patient.   Palliative Care was asked to follow this patient o help address complex decision making in the context of advance care planning and goals of care clarification.     PERTINENT MEDICATIONS:  Outpatient Encounter Medications as of 04/14/2022  Medication Sig   BIOTIN PO Take by mouth daily.   brimonidine (ALPHAGAN) 0.15 % ophthalmic solution INT 1 GTT INTO OU BID   cholecalciferol (VITAMIN D) 1000 UNITS tablet Take 1,000 Units by mouth 2 (two) times daily.    diclofenac sodium (VOLTAREN) 1 % GEL Apply 2 g topically 4 (four) times daily.   dorzolamide-timolol (COSOPT) 22.3-6.8 MG/ML ophthalmic solution Place 1 drop into both eyes daily.   latanoprost (XALATAN) 0.005 % ophthalmic solution Place 1 drop into both eyes at bedtime.    lisinopril (ZESTRIL) 20 MG tablet TAKE 1 TABLET DAILY   Menaquinone-7 (VITAMIN K2 PO) Take 1 capsule by mouth daily.   omeprazole (PRILOSEC) 20 MG capsule Take 1 capsule (20 mg total) by mouth daily.   sertraline (ZOLOFT) 100 MG tablet Take 1.5 tablets (150 mg total) by mouth daily.   No facility-administered encounter medications on file as of 04/14/2022.    HOSPICE ELIGIBILITY/DIAGNOSIS: TBD  PAST MEDICAL HISTORY:  Past Medical History:  Diagnosis Date   Arthritis    Osteoarthritis   Depression    GERD (gastroesophageal reflux disease)    Hyperlipidemia    Osteopenia       ALLERGIES:  Allergies  Allergen Reactions   Penicillins Rash      I spent 40 minutes providing this consultation; this includes time spent with patient/family, chart review and documentation. More than 50% of the time in this consultation was spent on counseling and coordinating communication   Thank you for the opportunity to  participate in the care of [name] Please call our office at [phone] if we can be of additional assistance.  Note: Portions of this note were generated with Lobbyist. Dictation errors may occur despite best attempts at proofreading.  Teodoro Spray, NP

## 2022-04-22 ENCOUNTER — Telehealth: Payer: Self-pay | Admitting: *Deleted

## 2022-04-22 NOTE — Telephone Encounter (Signed)
[name], Theme park manager with [employer] Called and stated that Palliative Care recommended patient to have PT in home. Requesting an order to be placed.   Also stated that patient took a fall last week and is sore. Thinks PT will help this also.   Please Advise.

## 2022-04-24 ENCOUNTER — Other Ambulatory Visit: Payer: Self-pay | Admitting: Family Medicine

## 2022-04-24 ENCOUNTER — Telehealth: Payer: Self-pay | Admitting: Family Medicine

## 2022-04-24 NOTE — Telephone Encounter (Signed)
Spoke with patient, gabapentin is not on her active medication list . Scheduled appointment to further discuss with Dr.Miller's NP tomorrow

## 2022-04-24 NOTE — Telephone Encounter (Signed)
Please help with process.

## 2022-04-24 NOTE — Telephone Encounter (Signed)
Pt requesting refill of GABAPENTIN. Pt states she needs a new prescription sent.  Pt says Dr. Sabra Heck prescribes for her.

## 2022-04-25 ENCOUNTER — Encounter: Payer: Self-pay | Admitting: Adult Health

## 2022-04-25 ENCOUNTER — Telehealth (INDEPENDENT_AMBULATORY_CARE_PROVIDER_SITE_OTHER): Payer: No Typology Code available for payment source | Admitting: Adult Health

## 2022-04-25 ENCOUNTER — Telehealth: Payer: Self-pay

## 2022-04-25 ENCOUNTER — Other Ambulatory Visit: Payer: Self-pay | Admitting: Family Medicine

## 2022-04-25 VITALS — Ht 61.0 in | Wt 109.0 lb

## 2022-04-25 DIAGNOSIS — I10 Essential (primary) hypertension: Secondary | ICD-10-CM

## 2022-04-25 DIAGNOSIS — F339 Major depressive disorder, recurrent, unspecified: Secondary | ICD-10-CM

## 2022-04-25 DIAGNOSIS — K219 Gastro-esophageal reflux disease without esophagitis: Secondary | ICD-10-CM

## 2022-04-25 DIAGNOSIS — G6289 Other specified polyneuropathies: Secondary | ICD-10-CM | POA: Diagnosis not present

## 2022-04-25 MED ORDER — GABAPENTIN 300 MG PO CAPS: 300.0000 mg | ORAL_CAPSULE | Freq: Two times a day (BID) | ORAL | 3 refills | Status: DC

## 2022-04-25 NOTE — Patient Instructions (Signed)
Peripheral Neuropathy Peripheral neuropathy is a type of nerve damage. It affects nerves that carry signals between the spinal cord and the arms, legs, and the rest of the body (peripheral nerves). It does not affect nerves in the spinal cord or brain. In peripheral neuropathy, one nerve or a group of nerves may be damaged. Peripheral neuropathy is a broad category that includes many specific nerve disorders, like diabetic neuropathy, hereditary neuropathy, and carpal tunnel syndrome. What are the causes? This condition may be caused by: Certain diseases, such as: Diabetes. This is the most common cause of peripheral neuropathy. Autoimmune diseases, such as rheumatoid arthritis and systemic lupus erythematosus. Nerve diseases that are passed from parent to child (inherited). Kidney disease. Thyroid disease. Other causes may include: Nerve injury. Pressure or stress on a nerve that lasts a long time. Lack (deficiency) of B vitamins. This can result from alcoholism, poor diet, or a restricted diet. Infections. Some medicines, such as cancer medicines (chemotherapy). Poisonous (toxic) substances, such as lead and mercury. Too little blood flowing to the legs. In some cases, the cause of this condition is not known. What are the signs or symptoms? Symptoms of this condition depend on which of your nerves is damaged. Symptoms in the legs, hands, and arms can include: Loss of feeling (numbness) in the feet, hands, or both. Tingling in the feet, hands, or both. Burning pain. Very sensitive skin. Weakness. Not being able to move a part of the body (paralysis). Clumsiness or poor coordination. Muscle twitching. Loss of balance. Symptoms in other parts of the body can include: Not being able to control your bladder. Feeling dizzy. Sexual problems. How is this diagnosed? Diagnosing and finding the cause of peripheral neuropathy can be difficult. Your health care provider will take your  medical history and do a physical exam. A neurological exam will also be done. This involves checking things that are affected by your brain, spinal cord, and nerves (nervous system). For example, your health care provider will check your reflexes, how you move, and what you can feel. You may have other tests, such as: Blood tests. Electromyogram (EMG) and nerve conduction tests. These tests check nerve function and how well the nerves are controlling the muscles. Imaging tests, such as a CT scan or MRI, to rule out other causes of your symptoms. Removing a small piece of nerve to be examined in a lab (nerve biopsy). Removing and examining a small amount of the fluid that surrounds the brain and spinal cord (lumbar puncture). How is this treated? Treatment for this condition may involve: Treating the underlying cause of the neuropathy, such as diabetes, kidney disease, or vitamin deficiencies. Stopping medicines that can cause neuropathy, such as chemotherapy. Medicine to help relieve pain. Medicines may include: Prescription or over-the-counter pain medicine. Anti-seizure medicine. Antidepressants. Pain-relieving patches that are applied to painful areas of skin. Surgery to relieve pressure on a nerve or to destroy a nerve that is causing pain. Physical therapy to help improve movement and balance. Devices to help you move around (assistive devices). Follow these instructions at home: Medicines Take over-the-counter and prescription medicines only as told by your health care provider. Do not take any other medicines without first asking your health care provider. Ask your health care provider if the medicine prescribed to you requires you to avoid driving or using machinery. Lifestyle  Do not use any products that contain nicotine or tobacco. These products include cigarettes, chewing tobacco, and vaping devices, such as e-cigarettes. Smoking keeps   blood from reaching damaged nerves. If you  need help quitting, ask your health care provider. Avoid or limit alcohol. Too much alcohol can cause a vitamin B deficiency, and vitamin B is needed for healthy nerves. Eat a healthy diet. This includes: Eating foods that are high in fiber, such as beans, whole grains, and fresh fruits and vegetables. Limiting foods that are high in fat and processed sugars, such as fried or sweet foods. General instructions  If you have diabetes, work closely with your health care provider to keep your blood sugar under control. If you have numbness in your feet: Check every day for signs of injury or infection. Watch for redness, warmth, and swelling. Wear padded socks and comfortable shoes. These help protect your feet. Develop a good support system. Living with peripheral neuropathy can be stressful. Consider talking with a mental health specialist or joining a support group. Use assistive devices and attend physical therapy as told by your health care provider. This may include using a walker or a cane. Keep all follow-up visits. This is important. Where to find more information National Institute of Neurological Disorders: www.ninds.nih.gov Contact a health care provider if: You have new signs or symptoms of peripheral neuropathy. You are struggling emotionally from dealing with peripheral neuropathy. Your pain is not well controlled. Get help right away if: You have an injury or infection that is not healing normally. You develop new weakness in an arm or leg. You have fallen or do so frequently. Summary Peripheral neuropathy is when the nerves in the arms or legs are damaged, resulting in numbness, weakness, or pain. There are many causes of peripheral neuropathy, including diabetes, pinched nerves, vitamin deficiencies, autoimmune disease, and hereditary conditions. Diagnosing and finding the cause of peripheral neuropathy can be difficult. Your health care provider will take your medical  history, do a physical exam, and do tests, including blood tests and nerve function tests. Treatment involves treating the underlying cause of the neuropathy and taking medicines to help control pain. Physical therapy and assistive devices may also help. This information is not intended to replace advice given to you by your health care provider. Make sure you discuss any questions you have with your health care provider. Document Revised: 04/09/2021 Document Reviewed: 04/09/2021 Elsevier Patient Education  2023 [employer].  

## 2022-04-25 NOTE — Progress Notes (Signed)
Ms. [name], knueppel are scheduled for a virtual visit with your provider today.    Just as we do with appointments in the office, we must obtain your consent to participate.  Your consent will be active for this visit and any virtual visit you may have with one of our providers in the next 365 days.    If you have a MyChart account, I can also send a copy of this consent to you electronically.  All virtual visits are billed to your insurance company just like a traditional visit in the office.  As this is a virtual visit, video technology does not allow for your provider to perform a traditional examination.  This may limit your provider's ability to fully assess your condition.  If your provider identifies any concerns that need to be evaluated in person or the need to arrange testing such as labs, EKG, etc, we will make arrangements to do so.    Although advances in technology are sophisticated, we cannot ensure that it will always work on either your end or our end.  If the connection with a video visit is poor, we may have to switch to a telephone visit.  With either a video or telephone visit, we are not always able to ensure that we have a secure connection.   I need to obtain your verbal consent now.   Are you willing to proceed with your visit today?   CAOILAINN SACKS has provided verbal consent on 04/25/2022 for a virtual visit (video or telephone).   Debe Coder, [place] 04/25/2022  12:56 PM dermatitisMs. Carter,you are scheduled for a virtual visit with your provider today.    This service is provided via telemedicine  No vital signs collected/recorded due to the encounter was a telemedicine visit.   Location of patient (ex: home, work):  home    Patient consents to a telephone visit:  yes  Location of the provider (ex: office, home):  [facility]  Name of any referring provider:  none  Names of all persons participating in the telemedicine service and their role  in the encounter:  patient, ,Earl Gala [facility], Durenda Age, NP  Time spent on call:  5     DATE:  04/25/2022 MRN:  [id number]  BIRTHDAY: 1926-11-13   Contact Information     Name Relation Home Work [place] Son   [phone]        Code Status History     Date Active Date Inactive Code Status Order ID Comments User Context   01/22/2015 1451 06/30/2016 1117 DNR [id number]  Gayland Curry, DO Outpatient        Chief Complaint  Patient presents with   Acute Visit    Patient wants to discuss gabapentin medication     HISTORY OF PRESENT ILLNESS: This is a 88 year old female who requested to have a telephone visit regarding Gabapentin. She has a PMH of depression, hypertension and GERD. She complains of her feet being numb, occasionally on both hands. She thinks she has arthritis as well.  She is requesting a refill on her Gabapentin 300 mg Q AM and HS for her neuropathy. She lives alone and has a son who lives in [place]. She stopped driving 5 years ago. She has a daughter who died 2 years ago. She Sertraline for depression. She is followed by palliative care.   PAST MEDICAL HISTORY:  Past Medical History:  Diagnosis Date   Arthritis  Osteoarthritis   Depression    GERD (gastroesophageal reflux disease)    Hyperlipidemia    Osteopenia      CURRENT MEDICATIONS: Reviewed  Patient's Medications  New Prescriptions   No medications on file  Previous Medications   BIOTIN PO    Take by mouth daily.   BRIMONIDINE (ALPHAGAN) 0.15 % OPHTHALMIC SOLUTION    INT 1 GTT INTO OU BID   CHOLECALCIFEROL (VITAMIN D) 1000 UNITS TABLET    Take 1,000 Units by mouth 2 (two) times daily.    DICLOFENAC SODIUM (VOLTAREN) 1 % GEL    Apply 2 g topically 4 (four) times daily.   DORZOLAMIDE-TIMOLOL (COSOPT) 22.3-6.8 MG/ML OPHTHALMIC SOLUTION    Place 1 drop into both eyes daily.   LATANOPROST (XALATAN) 0.005 % OPHTHALMIC SOLUTION    Place 1 drop into both eyes at  bedtime.    LISINOPRIL (ZESTRIL) 20 MG TABLET    TAKE 1 TABLET DAILY   MENAQUINONE-7 (VITAMIN K2 PO)    Take 1 capsule by mouth daily.   OMEPRAZOLE (PRILOSEC) 20 MG CAPSULE    Take 1 capsule (20 mg total) by mouth daily.   SERTRALINE (ZOLOFT) 100 MG TABLET    Take 1.5 tablets (150 mg total) by mouth daily.  Modified Medications   No medications on file  Discontinued Medications   No medications on file     Allergies  Allergen Reactions   Penicillins Rash     REVIEW OF SYSTEMS:  GENERAL: no change in appetite, no fatigue, no weight changes, no fever, chills or weakness SKIN: Denies rash, itching, wounds, ulcer sores, or nail abnormality EYES: Denies change in vision, dry eyes, eye pain, itching or discharge EARS: Denies change in hearing, ringing in ears, or earache NOSE: Denies nasal congestion or epistaxis MOUTH and THROAT: Denies oral discomfort, gingival pain or bleeding, pain from teeth or hoarseness   RESPIRATORY: no cough, SOB, DOE, wheezing, hemoptysis CARDIAC: no chest pain, edema or palpitations GI: no abdominal pain, diarrhea, constipation, heart burn, nausea or vomiting GU: Denies dysuria, frequency, hematuria, incontinence, or discharge MUSCULOSKELETAL: Denies joit pain, muscle pain, back pain, restricted movement, or unusual weakness NEUROLOGICAL: Has numbness of bilateral feet PSYCHIATRIC: Denies feeling of depression or anxiety. No report of hallucinations, insomnia, paranoia, or agitation    LABS/RADIOLOGY: Labs reviewed: Basic Metabolic Panel: No results for input(s): "NA", "K", "CL", "CO2", "GLUCOSE", "BUN", "CREATININE", "CALCIUM", "MG", "PHOS" in the last 8760 hours. Liver Function Tests: No results for input(s): "AST", "ALT", "ALKPHOS", "BILITOT", "PROT", "ALBUMIN" in the last 8760 hours. No results for input(s): "LIPASE", "AMYLASE" in the last 8760 hours. No results for input(s): "AMMONIA" in the last 8760 hours. CBC: No results for input(s): "WBC",  "NEUTROABS", "HGB", "HCT", "MCV", "PLT" in the last 8760 hours. A1C: Invalid input(s): "A1C" Lipid Panel: No results for input(s): "HDL" in the last 8760 hours.  Invalid input(s): "LDL", "TRIGLYCERIDE", "TOTALCHOL" Cardiac Enzymes: No results for input(s): "CKTOTAL", "CKMB", "CKMBINDEX", "TROPONINI" in the last 8760 hours. BNP: Invalid input(s): "POCBNP" CBG: No results for input(s): "GLUCAP" in the last 8760 hours.    No results found.  ASSESSMENT/PLAN:  1. Other polyneuropathy -  stable - gabapentin (NEURONTIN) 300 MG capsule; Take 1 capsule (300 mg total) by mouth 2 (two) times daily.  Dispense: 90 capsule; Refill: 3  2. Essential hypertension, benign -  continue Lisinopril  3. GERD without esophagitis -  continue Omeprazole  4. Major depression, recurrent, chronic (HCC) -   mood is stable, continue Sertraline  Time spent on non face to face visit:  14 minutes  The patient gave consent to this telephone visit. Explained to the patient the risk and privacy issue that was involved with this telephone call.   The patient was advised to call back and ask for an in-person evaluation if the symptoms worsen or if the condition fails to improve.   Durenda Age, NP [employer] [phone]

## 2022-04-25 NOTE — Telephone Encounter (Signed)
I connected with  [name] on 04/25/22 by a video enabled telemedicine application and verified that I am speaking with the correct person using two identifiers.   I discussed the limitations of evaluation and management by telemedicine. The patient expressed understanding and agreed to proceed.  See consent 09/05/2021

## 2022-04-30 DIAGNOSIS — Z85828 Personal history of other malignant neoplasm of skin: Secondary | ICD-10-CM | POA: Diagnosis not present

## 2022-04-30 DIAGNOSIS — C44329 Squamous cell carcinoma of skin of other parts of face: Secondary | ICD-10-CM | POA: Diagnosis not present

## 2022-04-30 DIAGNOSIS — D485 Neoplasm of uncertain behavior of skin: Secondary | ICD-10-CM | POA: Diagnosis not present

## 2022-04-30 DIAGNOSIS — L218 Other seborrheic dermatitis: Secondary | ICD-10-CM | POA: Diagnosis not present

## 2022-04-30 DIAGNOSIS — R69 Illness, unspecified: Secondary | ICD-10-CM | POA: Diagnosis not present

## 2022-04-30 DIAGNOSIS — L720 Epidermal cyst: Secondary | ICD-10-CM | POA: Diagnosis not present

## 2022-05-01 DIAGNOSIS — M159 Polyosteoarthritis, unspecified: Secondary | ICD-10-CM | POA: Diagnosis not present

## 2022-05-01 DIAGNOSIS — R69 Illness, unspecified: Secondary | ICD-10-CM | POA: Diagnosis not present

## 2022-05-14 DIAGNOSIS — H401113 Primary open-angle glaucoma, right eye, severe stage: Secondary | ICD-10-CM | POA: Diagnosis not present

## 2022-05-14 DIAGNOSIS — H401122 Primary open-angle glaucoma, left eye, moderate stage: Secondary | ICD-10-CM | POA: Diagnosis not present

## 2022-05-14 DIAGNOSIS — Z961 Presence of intraocular lens: Secondary | ICD-10-CM | POA: Diagnosis not present

## 2022-05-14 DIAGNOSIS — H43813 Vitreous degeneration, bilateral: Secondary | ICD-10-CM | POA: Diagnosis not present

## 2022-05-14 DIAGNOSIS — R69 Illness, unspecified: Secondary | ICD-10-CM | POA: Diagnosis not present

## 2022-05-15 DIAGNOSIS — R69 Illness, unspecified: Secondary | ICD-10-CM | POA: Diagnosis not present

## 2022-05-23 ENCOUNTER — Other Ambulatory Visit: Payer: Self-pay | Admitting: Nurse Practitioner

## 2022-05-23 DIAGNOSIS — K219 Gastro-esophageal reflux disease without esophagitis: Secondary | ICD-10-CM

## 2022-06-25 DIAGNOSIS — H401122 Primary open-angle glaucoma, left eye, moderate stage: Secondary | ICD-10-CM | POA: Diagnosis not present

## 2022-06-25 DIAGNOSIS — R69 Illness, unspecified: Secondary | ICD-10-CM | POA: Diagnosis not present

## 2022-06-25 DIAGNOSIS — H43813 Vitreous degeneration, bilateral: Secondary | ICD-10-CM | POA: Diagnosis not present

## 2022-06-25 DIAGNOSIS — Z961 Presence of intraocular lens: Secondary | ICD-10-CM | POA: Diagnosis not present

## 2022-06-25 DIAGNOSIS — H401113 Primary open-angle glaucoma, right eye, severe stage: Secondary | ICD-10-CM | POA: Diagnosis not present

## 2022-06-26 DIAGNOSIS — R69 Illness, unspecified: Secondary | ICD-10-CM | POA: Diagnosis not present

## 2022-08-18 HISTORY — PX: EYE SURGERY: SHX253

## 2022-09-09 DIAGNOSIS — H401113 Primary open-angle glaucoma, right eye, severe stage: Secondary | ICD-10-CM | POA: Diagnosis not present

## 2022-09-09 DIAGNOSIS — Z961 Presence of intraocular lens: Secondary | ICD-10-CM | POA: Diagnosis not present

## 2022-09-09 DIAGNOSIS — H43813 Vitreous degeneration, bilateral: Secondary | ICD-10-CM | POA: Diagnosis not present

## 2022-09-09 DIAGNOSIS — H401122 Primary open-angle glaucoma, left eye, moderate stage: Secondary | ICD-10-CM | POA: Diagnosis not present

## 2022-09-26 ENCOUNTER — Telehealth: Payer: Self-pay | Admitting: Hospice

## 2022-09-26 DIAGNOSIS — Z515 Encounter for palliative care: Secondary | ICD-10-CM

## 2022-09-26 NOTE — Telephone Encounter (Signed)
NP called patient to check in, and left her a voicemail with call back number.

## 2022-10-09 ENCOUNTER — Encounter: Payer: No Typology Code available for payment source | Admitting: Nurse Practitioner

## 2022-10-10 ENCOUNTER — Encounter: Payer: Medicare Other | Admitting: Nurse Practitioner

## 2022-10-16 DIAGNOSIS — H401123 Primary open-angle glaucoma, left eye, severe stage: Secondary | ICD-10-CM | POA: Diagnosis not present

## 2022-11-03 ENCOUNTER — Other Ambulatory Visit: Payer: Self-pay

## 2022-11-03 DIAGNOSIS — I10 Essential (primary) hypertension: Secondary | ICD-10-CM

## 2022-11-03 MED ORDER — LISINOPRIL 20 MG PO TABS: 20.0000 mg | ORAL_TABLET | Freq: Every day | ORAL | 1 refills | Status: DC

## 2022-11-03 NOTE — Telephone Encounter (Signed)
Refill request received from pharmacy °

## 2022-11-06 ENCOUNTER — Telehealth: Payer: Self-pay

## 2022-11-06 NOTE — Telephone Encounter (Signed)
Left message on voicemail for patient to return call when available, reason for call: incoming fax received from [facility] for a refill for Lisinopril. [facility] and I need to clarify which mail order pharmacy patient is using.

## 2022-11-06 NOTE — Telephone Encounter (Signed)
Patient returned called and stated that she is still using [facility] as far as she knows and I can discard the request from [place]

## 2022-11-18 ENCOUNTER — Other Ambulatory Visit: Payer: No Typology Code available for payment source | Admitting: Hospice

## 2022-11-18 DIAGNOSIS — F339 Major depressive disorder, recurrent, unspecified: Secondary | ICD-10-CM

## 2022-11-18 DIAGNOSIS — G8929 Other chronic pain: Secondary | ICD-10-CM | POA: Diagnosis not present

## 2022-11-18 DIAGNOSIS — E440 Moderate protein-calorie malnutrition: Secondary | ICD-10-CM | POA: Diagnosis not present

## 2022-11-18 DIAGNOSIS — Z515 Encounter for palliative care: Secondary | ICD-10-CM

## 2022-11-18 DIAGNOSIS — M545 Low back pain, unspecified: Secondary | ICD-10-CM | POA: Diagnosis not present

## 2022-11-18 NOTE — Progress Notes (Signed)
[facility] Consult Note Telephone: [phone]  Fax: [phone]  PATIENT NAME: [name] DOB: 28-May-1927 MRN: [id number]  PRIMARY CARE PROVIDER:   Wardell Honour, MD Wardell Honour, [place],  [place] [address]  REFERRING PROVIDER: Wardell Honour, MD Wardell Honour, MD [place],  [place] [address]  RESPONSIBLE PARTY:  Self [phone] [place] service coordinator [phone] office [phone] Caregiver Bretta Bang [phone] - Tues and Thurs 1-3pm Contact Information     Name Relation Home Work Mobile   [place]   [phone]      I met face to face with patient in her [place]. Caregiver Bretta Bang  ([place]) is with patient during visit.  Palliative Care was asked to follow this patient to address advance care planning, complex medical decision making and goals of care clarification.    Visit consisted of counseling and education dealing with the complex and emotionally intense issues of symptom management and palliative care in the setting of serious and potentially life-threatening illness.  Patient shared her apartment was repainted and it has been a challenge unpacking and getting things organized. She is getting help from [place], [place]. Therapeutic listening and ample emotional support provided. Palliative care team will continue to support patient, patient's family, and medical team. RECOMMENDATIONS/PLAN:   Advance Care Planning/Code Status:Patient is a Do Not Resusucitate.  Goals of Care: Goals of care include to maximize quality of life and symptom management. Patient is interested in hospice service in the future when she qualifies for it.   Symptom management/Plan:  Fall: No fall since last visit. Use rolling walker for support. Education on slow position changes. Fall precautions reiterated.   Depression: Stable. Continue Zoloft.   Psych consult as needed.  Encourage  socialization. And participation in facility activities.  Patient said she will start Tai chi class organized by the facility.   Protein Caloric Malnutrition. Current weight is 101 Ibs, 102 Ibs April '23. Height 5 feet 1 inch. Continue with Boost BID, healthy snacks in between meals. Routine CBC CMP. Low Back pain: Stable. Patient reports she uses OTC Voltaren gel prn and it is effective.    Follow up: Palliative care will continue to follow for complex medical decision making, advance care planning, and clarification of goals. Return 6 weeks or prn.Encouraged to call provider sooner with any concerns.    Family/Caregiver/Community Supports: Patient lives in [place]; her son [name] lives in [place]. She participates in health and wellness programs Patient has personal care services from [place]. Bretta Bang is with patient today.  HOSPICE ELIGIBILITY/DIAGNOSIS: TBD   Chief Complaint: Follow up visit   HISTORY OF PRESENT ILLNESS:  [name] is a 89 y.o. year old female  with multiple medical conditions including Fall secondary to gait disturbance, Protein caloric malnutrition, weight loss. History of  arthritis, low back pain, osteoarthritis, hypertension, GERD. She denies pain/discomfort, no respiratory distress.  She reports she has a stent put in her left eye last month, plan is to do the right eye next week. History obtained from review of EMR, discussion with primary team, family and/or patient. Records reviewed and summarized above. All 10 point systems reviewed and are negative except as documented in history of present illness above  Review and summarization of Epic records shows history from other than patient.   Palliative Care was asked to follow this patient o  help address complex decision making in the context of advance care planning and goals of care clarification.   PHYSICAL EXAM: GEN: in no acute distress  Cardiac: S1 S2, no LE edema feet/ankles  Respiratory:  clear to  auscultation bilaterally, GI: soft, nontender, nondistended, + BS   MS: Moving all extremities, ambulatory with rolling walker, no edema to BLE   Neuro: generalized weakness Psych: non-anxious affect, cooperative    PERTINENT MEDICATIONS:  Outpatient Encounter Medications as of 11/18/2022  Medication Sig   BIOTIN PO Take by mouth daily.   brimonidine (ALPHAGAN) 0.15 % ophthalmic solution INT 1 GTT INTO OU BID   cholecalciferol (VITAMIN D) 1000 UNITS tablet Take 1,000 Units by mouth 2 (two) times daily.    diclofenac sodium (VOLTAREN) 1 % GEL Apply 2 g topically 4 (four) times daily.   dorzolamide-timolol (COSOPT) 22.3-6.8 MG/ML ophthalmic solution Place 1 drop into both eyes daily.   gabapentin (NEURONTIN) 300 MG capsule Take 1 capsule (300 mg total) by mouth 2 (two) times daily.   latanoprost (XALATAN) 0.005 % ophthalmic solution Place 1 drop into both eyes at bedtime.    lisinopril (ZESTRIL) 20 MG tablet Take 1 tablet (20 mg total) by mouth daily.   Menaquinone-7 (VITAMIN K2 PO) Take 1 capsule by mouth daily.   omeprazole (PRILOSEC) 20 MG capsule TAKE 1 CAPSULE DAILY       (DISCONTINUE PREVACID)   sertraline (ZOLOFT) 100 MG tablet Take 1.5 tablets (150 mg total) by mouth daily.   No facility-administered encounter medications on file as of 11/18/2022.    HOSPICE ELIGIBILITY/DIAGNOSIS: TBD  PAST MEDICAL HISTORY:  Past Medical History:  Diagnosis Date   Arthritis    Osteoarthritis   Depression    GERD (gastroesophageal reflux disease)    Hyperlipidemia    Osteopenia       ALLERGIES:  Allergies  Allergen Reactions   Penicillins Rash      I spent 60 minutes providing this consultation; this includes time spent with patient/family, chart review and documentation. More than 50% of the time in this consultation was spent on counseling and coordinating communication   Thank you for the opportunity to participate in the care of [name] Please call our office at [phone]  if we can be of additional assistance.  Note: Portions of this note were generated with [employer]. Dictation errors may occur despite best attempts at proofreading.  Teodoro Spray, NP

## 2022-11-20 ENCOUNTER — Telehealth: Payer: Self-pay

## 2022-11-20 DIAGNOSIS — I10 Essential (primary) hypertension: Secondary | ICD-10-CM

## 2022-11-20 DIAGNOSIS — K219 Gastro-esophageal reflux disease without esophagitis: Secondary | ICD-10-CM

## 2022-11-20 DIAGNOSIS — G6289 Other specified polyneuropathies: Secondary | ICD-10-CM

## 2022-11-20 MED ORDER — SERTRALINE HCL 100 MG PO TABS: 150.0000 mg | ORAL_TABLET | Freq: Every day | ORAL | 0 refills | Status: DC

## 2022-11-20 MED ORDER — OMEPRAZOLE 20 MG PO CPDR: DELAYED_RELEASE_CAPSULE | ORAL | 0 refills | Status: DC

## 2022-11-20 MED ORDER — LISINOPRIL 20 MG PO TABS: 20.0000 mg | ORAL_TABLET | Freq: Every day | ORAL | 0 refills | Status: DC

## 2022-11-20 MED ORDER — GABAPENTIN 300 MG PO CAPS: 300.0000 mg | ORAL_CAPSULE | Freq: Two times a day (BID) | ORAL | 0 refills | Status: DC

## 2022-11-20 NOTE — Telephone Encounter (Signed)
This encounter was created in error - please disregard.

## 2022-11-20 NOTE — Telephone Encounter (Signed)
[facility] pharmacy until mail order comes in

## 2022-11-21 ENCOUNTER — Encounter: Payer: Medicare Other | Admitting: Nurse Practitioner

## 2022-11-21 ENCOUNTER — Other Ambulatory Visit: Payer: Self-pay

## 2022-11-21 DIAGNOSIS — K219 Gastro-esophageal reflux disease without esophagitis: Secondary | ICD-10-CM

## 2022-11-21 DIAGNOSIS — G6289 Other specified polyneuropathies: Secondary | ICD-10-CM

## 2022-11-21 DIAGNOSIS — I10 Essential (primary) hypertension: Secondary | ICD-10-CM

## 2022-11-21 MED ORDER — GABAPENTIN 300 MG PO CAPS
300.0000 mg | ORAL_CAPSULE | Freq: Two times a day (BID) | ORAL | 1 refills | Status: DC
Start: 2022-11-21 — End: 2023-01-14

## 2022-11-21 MED ORDER — SERTRALINE HCL 100 MG PO TABS: 150.0000 mg | ORAL_TABLET | Freq: Every day | ORAL | 1 refills | Status: DC

## 2022-11-21 MED ORDER — LISINOPRIL 20 MG PO TABS
20.0000 mg | ORAL_TABLET | Freq: Every day | ORAL | 1 refills | Status: DC
Start: 2022-11-21 — End: 2023-01-23

## 2022-11-21 MED ORDER — OMEPRAZOLE 20 MG PO CPDR
DELAYED_RELEASE_CAPSULE | ORAL | 1 refills | Status: DC
Start: 2022-11-21 — End: 2023-01-14

## 2022-11-22 ENCOUNTER — Other Ambulatory Visit: Payer: Self-pay

## 2022-11-22 ENCOUNTER — Encounter (HOSPITAL_COMMUNITY): Payer: Self-pay

## 2022-11-22 ENCOUNTER — Emergency Department (HOSPITAL_COMMUNITY): Payer: Medicare Other

## 2022-11-22 ENCOUNTER — Emergency Department (HOSPITAL_COMMUNITY)
Admission: EM | Admit: 2022-11-22 | Discharge: 2022-11-22 | Disposition: A | Discharge status: Home / Self Care | Payer: Medicare Other | Attending: Emergency Medicine | Admitting: Emergency Medicine

## 2022-11-22 DIAGNOSIS — Y92009 Unspecified place in unspecified non-institutional (private) residence as the place of occurrence of the external cause: Secondary | ICD-10-CM | POA: Diagnosis not present

## 2022-11-22 DIAGNOSIS — S0990XA Unspecified injury of head, initial encounter: Secondary | ICD-10-CM | POA: Insufficient documentation

## 2022-11-22 DIAGNOSIS — R001 Bradycardia, unspecified: Secondary | ICD-10-CM | POA: Diagnosis not present

## 2022-11-22 DIAGNOSIS — I10 Essential (primary) hypertension: Secondary | ICD-10-CM | POA: Diagnosis not present

## 2022-11-22 DIAGNOSIS — R262 Difficulty in walking, not elsewhere classified: Secondary | ICD-10-CM | POA: Diagnosis not present

## 2022-11-22 DIAGNOSIS — R531 Weakness: Secondary | ICD-10-CM | POA: Diagnosis not present

## 2022-11-22 DIAGNOSIS — Z7401 Bed confinement status: Secondary | ICD-10-CM | POA: Diagnosis not present

## 2022-11-22 DIAGNOSIS — W19XXXA Unspecified fall, initial encounter: Secondary | ICD-10-CM | POA: Diagnosis not present

## 2022-11-22 DIAGNOSIS — R0902 Hypoxemia: Secondary | ICD-10-CM | POA: Diagnosis not present

## 2022-11-22 LAB — URINALYSIS, ROUTINE W REFLEX MICROSCOPIC
Bilirubin Urine: NEGATIVE
Glucose, UA: NEGATIVE mg/dL
Hgb urine dipstick: NEGATIVE
Ketones, ur: NEGATIVE mg/dL
Leukocytes,Ua: NEGATIVE
Nitrite: NEGATIVE
Protein, ur: NEGATIVE mg/dL
Specific Gravity, Urine: 1.012 (ref 1.005–1.030)
pH: 7 (ref 5.0–8.0)

## 2022-11-22 LAB — BASIC METABOLIC PANEL
Anion gap: 10 (ref 5–15)
BUN: 16 mg/dL (ref 8–23)
CO2: 27 mmol/L (ref 22–32)
Calcium: 9.8 mg/dL (ref 8.9–10.3)
Chloride: 103 mmol/L (ref 98–111)
Creatinine, Ser: 0.66 mg/dL (ref 0.44–1.00)
GFR, Estimated: >60 mL/min (ref >60)
Glucose, Bld: 128 mg/dL — ABNORMAL HIGH (ref 70–99)
Potassium: 3.5 mmol/L (ref 3.5–5.1)
Sodium: 140 mmol/L (ref 135–145)

## 2022-11-22 LAB — CBC WITH DIFFERENTIAL/PLATELET
Abs Immature Granulocytes: 0 10*3/uL (ref 0.00–0.07)
Basophils Absolute: 0 10*3/uL (ref 0.0–0.1)
Basophils Relative: 1 %
Eosinophils Absolute: 0.1 10*3/uL (ref 0.0–0.5)
Eosinophils Relative: 1 %
HCT: 41.5 % (ref 36.0–46.0)
Hemoglobin: 13.1 g/dL (ref 12.0–15.0)
Immature Granulocytes: 0 %
Lymphocytes Relative: 30 %
Lymphs Abs: 1.8 10*3/uL (ref 0.7–4.0)
MCH: 28.3 pg (ref 26.0–34.0)
MCHC: 31.6 g/dL (ref 30.0–36.0)
MCV: 89.6 fL (ref 80.0–100.0)
Monocytes Absolute: 0.5 10*3/uL (ref 0.1–1.0)
Monocytes Relative: 8 %
Neutro Abs: 3.6 10*3/uL (ref 1.7–7.7)
Neutrophils Relative %: 60 %
Platelets: 186 10*3/uL (ref 150–400)
RBC: 4.63 MIL/uL (ref 3.87–5.11)
RDW: 14.3 % (ref 11.5–15.5)
WBC: 5.9 10*3/uL (ref 4.0–10.5)
nRBC: 0 % (ref 0.0–0.2)

## 2022-11-22 MED ORDER — SODIUM CHLORIDE 0.9 % IV BOLUS
500.0000 mL | Freq: Once | INTRAVENOUS | Status: AC
  Administered 2022-11-22: 500 mL via INTRAVENOUS

## 2022-11-22 NOTE — ED Notes (Signed)
Patient ambulatory around the unit with a walker without assistance but with tech present. Patient stable, denies dizziness/sob.

## 2022-11-22 NOTE — Discharge Instructions (Addendum)
Please use your walker when ambulating. follow-up with your regular doctor.  Return to the emergency department if any worsening or concerning symptoms

## 2022-11-22 NOTE — ED Triage Notes (Signed)
Pt to the ed from home via ems with a CC of fall.  EMS had been to pt home 3 times in the last 2 hours for fall. Pt relay she walks with a walker but has been tripping over things and falling. Pt denies dizziness, cp, sob, loc at this time. Pt live at home alone

## 2022-11-22 NOTE — ED Notes (Signed)
[facility] called, No ETA 

## 2022-11-22 NOTE — ED Provider Notes (Signed)
[place] EMERGENCY DEPARTMENT AT [facility] Provider Note   CSN: [id number] Arrival date & time: 11/22/22  1857     History {Add pertinent medical, surgical, social history, OB history to HPI:1} Chief Complaint  Patient presents with   Titus Mould is a 89 y.o. female.  She lives at home alone.  She said she usually ambulates with a walker.  She had 2 falls today which is unusual for her, she said she usually falls once every month or 2.  She denies any injury from the fall.  She did not want to come but the ambulance crew told her that her gait was unsteady so she elected to be transported here.  She denies any complaints, no headache or neck pain and no recent illness.  She said she does not have a great appetite at baseline and so that she may be dehydrated.  She would like to return home once her evaluation is done  The history is provided by the patient.  Fall This is a recurrent problem. The problem occurs rarely. The problem has been resolved. Pertinent negatives include no chest pain, no abdominal pain, no headaches and no shortness of breath. The symptoms are aggravated by walking. Nothing relieves the symptoms. She has tried rest for the symptoms. The treatment provided no relief.       Home Medications Prior to Admission medications   Medication Sig Start Date End Date Taking? Authorizing Provider  BIOTIN PO Take by mouth daily.    [provider]  brimonidine (ALPHAGAN) 0.15 % ophthalmic solution INT 1 GTT INTO OU BID 01/08/18   [provider]  cholecalciferol (VITAMIN D) 1000 UNITS tablet Take 1,000 Units by mouth 2 (two) times daily.     [provider]  diclofenac sodium (VOLTAREN) 1 % GEL Apply 2 g topically 4 (four) times daily. 03/25/19   Kerrin Champagne, MD  dorzolamide-timolol (COSOPT) 22.3-6.8 MG/ML ophthalmic solution Place 1 drop into both eyes daily.    [provider]  gabapentin (NEURONTIN) 300 MG capsule  Take 1 capsule (300 mg total) by mouth 2 (two) times daily. 11/21/22   Frederica Kuster, MD  latanoprost (XALATAN) 0.005 % ophthalmic solution Place 1 drop into both eyes at bedtime.  08/31/12   [provider]  lisinopril (ZESTRIL) 20 MG tablet Take 1 tablet (20 mg total) by mouth daily. 11/21/22   Frederica Kuster, MD  Menaquinone-7 (VITAMIN K2 PO) Take 1 capsule by mouth daily.    [provider]  omeprazole (PRILOSEC) 20 MG capsule TAKE 1 CAPSULE DAILY       (DISCONTINUE PREVACID) 11/21/22   Frederica Kuster, MD  sertraline (ZOLOFT) 100 MG tablet Take 1.5 tablets (150 mg total) by mouth daily. 11/21/22   Frederica Kuster, MD      Allergies    Penicillins    Review of Systems   Review of Systems  Constitutional:  Negative for fever.  HENT:  Negative for sore throat.   Eyes:  Negative for visual disturbance.  Respiratory:  Negative for shortness of breath.   Cardiovascular:  Negative for chest pain.  Gastrointestinal:  Negative for abdominal pain.  Genitourinary:  Negative for dysuria.  Musculoskeletal:  Negative for back pain and neck pain.  Skin:  Negative for rash.  Neurological:  Negative for headaches.    Physical Exam Updated Vital Signs BP 124/84   Pulse 77   Temp 98.3 F (36.8 C) (  Oral)   Resp 16   Ht 5' (1.524 m)   Wt 43.5 kg   BMI 18.75 kg/m  Physical Exam Vitals and nursing note reviewed.  Constitutional:      General: She is not in acute distress.    Appearance: Normal appearance. She is well-developed.  HENT:     Head: Normocephalic and atraumatic.  Eyes:     Conjunctiva/sclera: Conjunctivae normal.  Cardiovascular:     Rate and Rhythm: Normal rate and regular rhythm.     Heart sounds: No murmur heard. Pulmonary:     Effort: Pulmonary effort is normal. No respiratory distress.     Breath sounds: Normal breath sounds.  Abdominal:     Palpations: Abdomen is soft.     Tenderness: There is no abdominal tenderness. There is no guarding or  rebound.  Musculoskeletal:        General: No tenderness or deformity. Normal range of motion.     Cervical back: Neck supple.     Right lower leg: No edema.     Left lower leg: No edema.  Skin:    General: Skin is warm and dry.     Capillary Refill: Capillary refill takes less than 2 seconds.  Neurological:     General: No focal deficit present.     Mental Status: She is alert and oriented to person, place, and time.     Cranial Nerves: No cranial nerve deficit.     Sensory: No sensory deficit.     Motor: No weakness.     ED Results / Procedures / Treatments   Labs (all labs ordered are listed, but only abnormal results are displayed) Labs Reviewed  BASIC METABOLIC PANEL  CBC WITH DIFFERENTIAL/PLATELET  URINALYSIS, ROUTINE W REFLEX MICROSCOPIC    EKG None  Radiology No results found.  Procedures Procedures  {Document cardiac monitor, telemetry assessment procedure when appropriate:1}  Medications Ordered in ED Medications  sodium chloride 0.9 % bolus 500 mL (has no administration in time range)    ED Course/ Medical Decision Making/ A&P   {   Click here for ABCD2, HEART and other calculatorsREFRESH Note before signing :1}                          Medical Decision Making Amount and/or Complexity of Data Reviewed Labs: ordered. Radiology: ordered.   This patient complains of ***; this involves an extensive number of treatment Options and is a complaint that carries with it a high risk of complications and morbidity. The differential includes ***  I ordered, reviewed and interpreted labs, which included *** I ordered medication *** and reviewed PMP when indicated. I ordered imaging studies which included *** and I independently    visualized and interpreted imaging which showed *** Additional history obtained from *** Previous records obtained and reviewed *** I consulted *** and discussed lab and imaging findings and discussed disposition.  Cardiac  monitoring reviewed, *** Social determinants considered, *** Critical Interventions: ***  After the interventions stated above, I reevaluated the patient and found *** Admission and further testing considered, ***   {Document critical care time when appropriate:1} {Document review of labs and clinical decision tools ie heart score, Chads2Vasc2 etc:1}  {Document your independent review of radiology images, and any outside records:1} {Document your discussion with family members, caretakers, and with consultants:1} {Document social determinants of health affecting pt's care:1} {Document your decision making why or why not admission, treatments were needed:1}  Final Clinical Impression(s) / ED Diagnoses Final diagnoses:  None    Rx / DC Orders ED Discharge Orders     None

## 2022-11-22 NOTE — ED Notes (Signed)
PT dispo to [facility] for transport home Son rick notified via phone at [phone] all paperwork completed prior to discharge

## 2022-11-22 NOTE — ED Notes (Signed)
PT pending [facility] ride home has been given dc instructions

## 2022-11-27 ENCOUNTER — Other Ambulatory Visit: Payer: Medicare Other | Admitting: Hospice

## 2022-11-27 DIAGNOSIS — F339 Major depressive disorder, recurrent, unspecified: Secondary | ICD-10-CM

## 2022-11-27 DIAGNOSIS — Z515 Encounter for palliative care: Secondary | ICD-10-CM | POA: Diagnosis not present

## 2022-11-27 DIAGNOSIS — M545 Low back pain, unspecified: Secondary | ICD-10-CM | POA: Diagnosis not present

## 2022-11-27 DIAGNOSIS — W19XXXD Unspecified fall, subsequent encounter: Secondary | ICD-10-CM

## 2022-11-27 DIAGNOSIS — G8929 Other chronic pain: Secondary | ICD-10-CM

## 2022-11-27 DIAGNOSIS — E440 Moderate protein-calorie malnutrition: Secondary | ICD-10-CM | POA: Diagnosis not present

## 2022-11-27 NOTE — Progress Notes (Signed)
Therapist, nutritional Palliative Care Consult Note Telephone: [phone]  Fax: [phone]  PATIENT NAME: [name] DOB: 1927/02/06 MRN: [id number]  PRIMARY CARE PROVIDER:   Frederica Kuster, MD Frederica Kuster, MD [address] [place],  [place] [address]  REFERRING PROVIDER: Frederica Kuster, MD Frederica Kuster, MD [address] [place],  [place] [address]  RESPONSIBLE PARTY:  Self [phone] [facility] - facility service coordinator [phone] office [phone] Caregiver [name] [phone] - Tues and Thurs 1-3pm Contact Information     Name Relation Home Work Mobile   Cashiers Son   [phone]      TELEHEALTH VISIT STATEMENT This visit was done via telemedicine from my office and it was initiated and consent by this patient and or family.  I connected with patient OR PROXY by a telephone/video  and verified that I am speaking with the correct person. I discussed the limitations of evaluation and management by telemedicine. Patient/proxy expressed understanding and agreed to proceed. IL supervisor - [name] initiated this telehealth visit to report patient's fall.  Palliative Care was asked to follow this patient to address advance care planning, complex medical decision making and goals of care clarification.   Visit consisted of counseling and education dealing with the complex and emotionally intense issues of symptom management and palliative care in the setting of serious and potentially life-threatening illness.  Patient shared her apartment was repainted and it has been a challenge unpacking and getting things organized. She said she is still upset by it and this could have led to her fall.  Therapeutic listening and ample emotional support provided. She agreed to let go of her upset and move on with her happy self.  Palliative care team will continue to support patient, patient's family, and medical team. RECOMMENDATIONS/PLAN:   Advance  Care Planning/Code Status:Patient is a Do Not Resusucitate.  Goals of Care: Goals of care include to maximize quality of life and symptom management. Patient is interested in hospice service in the future when she qualifies for it.   Symptom management/Plan:  Fall: Fall 11/22/22, seen in the ED, no injuries, not admitted. Use rolling walker for support. Education de clutter, on slow position changes. Fall precautions reiterated. Appointment to see Neurologist in May.  Depression: Mood is stable.  Continue Zoloft. Psych consult as needed.  Encourage socialization. And participation in facility activities.  Patient said she will start Tai chi class organized by the facility.   Protein Caloric Malnutrition. Current weight is 96 Ibs 11/21/22, 101 Ibs, 102 Ibs April '23. Height 5 feet 1 inch. Continue with Boost BID, healthy snacks in between meals. Routine CBC CMP. Low Back pain: Stable. Patient reports she uses OTC Voltaren gel prn and it is effective.    Follow up: Palliative care will continue to follow for complex medical decision making, advance care planning, and clarification of goals. Return 6 weeks or prn.Encouraged to call provider sooner with any concerns.    Family/Caregiver/Community Supports: Patient lives in [place]; her son [name] lives in [place]. She participates in health and wellness programs Patient has personal care services from [place].   HOSPICE ELIGIBILITY/DIAGNOSIS: TBD   Chief Complaint: Follow up visit   HISTORY OF PRESENT ILLNESS:  [name] is a 89 y.o. year old female  with multiple medical conditions including  recent fall secondary to gait disturbance for which she was seen in the ED, no  injuries, not admitted. She denies pain/discomfort, no chest pain, no abdominal pain, no dizziness, no headaches and no shortness of breath, no memory loss/confusion. History of protein caloric malnutrition, weight loss. History of  arthritis, low back pain, osteoarthritis,  hypertension, GERD. She denies pain/discomfort, no respiratory distress.  History obtained from review of EMR, discussion with primary team, family and/or patient. Records reviewed and summarized above. All 10 point systems reviewed and are negative except as documented in history of present illness above  Review and summarization of Epic records shows history from other than patient.   Palliative Care was asked to follow this patient o help address complex decision making in the context of advance care planning and goals of care clarification.   PERTINENT MEDICATIONS:  Outpatient Encounter Medications as of 11/18/2022  Medication Sig   BIOTIN PO Take by mouth daily.   brimonidine (ALPHAGAN) 0.15 % ophthalmic solution INT 1 GTT INTO OU BID   cholecalciferol (VITAMIN D) 1000 UNITS tablet Take 1,000 Units by mouth 2 (two) times daily.    diclofenac sodium (VOLTAREN) 1 % GEL Apply 2 g topically 4 (four) times daily.   dorzolamide-timolol (COSOPT) 22.3-6.8 MG/ML ophthalmic solution Place 1 drop into both eyes daily.   gabapentin (NEURONTIN) 300 MG capsule Take 1 capsule (300 mg total) by mouth 2 (two) times daily.   latanoprost (XALATAN) 0.005 % ophthalmic solution Place 1 drop into both eyes at bedtime.    lisinopril (ZESTRIL) 20 MG tablet Take 1 tablet (20 mg total) by mouth daily.   Menaquinone-7 (VITAMIN K2 PO) Take 1 capsule by mouth daily.   omeprazole (PRILOSEC) 20 MG capsule TAKE 1 CAPSULE DAILY       (DISCONTINUE PREVACID)   sertraline (ZOLOFT) 100 MG tablet Take 1.5 tablets (150 mg total) by mouth daily.   No facility-administered encounter medications on file as of 11/18/2022.    HOSPICE ELIGIBILITY/DIAGNOSIS: TBD  PAST MEDICAL HISTORY:  Past Medical History:  Diagnosis Date   Arthritis    Osteoarthritis   Depression    GERD (gastroesophageal reflux disease)    Hyperlipidemia    Osteopenia       ALLERGIES:  Allergies  Allergen Reactions   Penicillins Rash      I spent  30 minutes providing this consultation; this includes time spent with patient/family, chart review and documentation. More than 50% of the time in this consultation was spent on counseling and coordinating communication   Thank you for the opportunity to participate in the care of [name] Please call our office at [phone] if we can be of additional assistance.  Note: Portions of this note were generated with Scientist, clinical (histocompatibility and immunogenetics). Dictation errors may occur despite best attempts at proofreading.  Rosaura Carpenter, NP

## 2022-12-02 ENCOUNTER — Telehealth: Payer: Self-pay

## 2022-12-02 NOTE — Telephone Encounter (Signed)
Message left on clinical intake voicemail from patient's neighbor,Bonnie,(name not on patient's chart) stating that patient was still waiting on medication from [facility]. Medications were sent 11/21/22. [facility] and I was informed that patient just needs to call and give the consent for them to send the medications.  I called patient back, but there was no answer. I left message for patient with above information along with the telephone number to contact [facility] [phone]. I also left our office number if patient had any further questions or concerns

## 2022-12-05 ENCOUNTER — Telehealth: Payer: Self-pay

## 2022-12-05 NOTE — Telephone Encounter (Signed)
Patient called and would like order for rollator walker with seat.  Message routed to Dr. Jacalyn Lefevre

## 2022-12-09 NOTE — Telephone Encounter (Signed)
Approve request for rollator walker

## 2022-12-11 ENCOUNTER — Telehealth: Payer: Self-pay

## 2022-12-11 NOTE — Telephone Encounter (Signed)
I am not aware of order being sent for rollator walker. I was not the one that faxed the order. I don't see orde for rollator walker. I will fowrd message to Dr. Jacalyn Lefevre and Cheron Every, CMA

## 2022-12-11 NOTE — Telephone Encounter (Signed)
[name] from [facility] because they received a cover sheet stating that the patient need a Rollator walker with seat and they still need the order and also last office notes faxed to [phone].

## 2022-12-12 ENCOUNTER — Other Ambulatory Visit: Payer: Self-pay

## 2022-12-12 DIAGNOSIS — M159 Polyosteoarthritis, unspecified: Secondary | ICD-10-CM

## 2022-12-12 NOTE — Telephone Encounter (Signed)
Did she state who was the ordering provider or when was the order placed? I do not see any orders in patients chart about a walker. Patient did have a fall and was send to ED on 11/22/22 and saw hospice on 11/27/22.

## 2022-12-12 NOTE — Addendum Note (Signed)
Addended by: Elveria Royals on: 12/12/2022 02:03 PM   Modules accepted: Orders

## 2022-12-18 ENCOUNTER — Other Ambulatory Visit: Payer: Self-pay | Admitting: Family Medicine

## 2022-12-18 DIAGNOSIS — K219 Gastro-esophageal reflux disease without esophagitis: Secondary | ICD-10-CM

## 2022-12-18 DIAGNOSIS — I10 Essential (primary) hypertension: Secondary | ICD-10-CM

## 2022-12-18 DIAGNOSIS — M6281 Muscle weakness (generalized): Secondary | ICD-10-CM | POA: Diagnosis not present

## 2022-12-18 DIAGNOSIS — M1991 Primary osteoarthritis, unspecified site: Secondary | ICD-10-CM | POA: Diagnosis not present

## 2022-12-22 ENCOUNTER — Encounter: Payer: Medicare Other | Admitting: Nurse Practitioner

## 2022-12-25 ENCOUNTER — Other Ambulatory Visit: Payer: Medicare Other | Admitting: Hospice

## 2022-12-25 DIAGNOSIS — E440 Moderate protein-calorie malnutrition: Secondary | ICD-10-CM

## 2022-12-25 DIAGNOSIS — M545 Low back pain, unspecified: Secondary | ICD-10-CM

## 2022-12-25 DIAGNOSIS — G8929 Other chronic pain: Secondary | ICD-10-CM | POA: Diagnosis not present

## 2022-12-25 DIAGNOSIS — Z515 Encounter for palliative care: Secondary | ICD-10-CM | POA: Diagnosis not present

## 2022-12-25 DIAGNOSIS — F339 Major depressive disorder, recurrent, unspecified: Secondary | ICD-10-CM

## 2022-12-25 DIAGNOSIS — W19XXXD Unspecified fall, subsequent encounter: Secondary | ICD-10-CM

## 2022-12-25 NOTE — Progress Notes (Signed)
Therapist, nutritional Palliative Care Consult Note Telephone: [phone]  Fax: [phone]  PATIENT NAME: [name] DOB: [birth year] MRN: [id number]  PRIMARY CARE PROVIDER:   Frederica Kuster, MD Frederica Kuster, MD [address] [place],  [place] [address]  REFERRING PROVIDER: Frederica Kuster, MD Frederica Kuster, MD [address] [place],  [place] [address]  RESPONSIBLE PARTY:  Self [phone] [facility] - facility service coordinator [phone] office [phone] Caregiver [name] [phone] - Tues and Thurs 1-3pm Contact Information     Name Relation Home Work Mobile   [name] Son   [phone]       Palliative Care was asked to follow this patient to address advance care planning, complex medical decision making and goals of care clarification.   Visit consisted of counseling and education dealing with the complex and emotionally intense issues of symptom management and palliative care in the setting of serious and potentially life-threatening illness. Therapeutic presence/listening and ample emotional support provided. Palliative care team will continue to support patient, patient's family, and medical team. RECOMMENDATIONS/PLAN:   Advance Care Planning/Code Status:Patient is a Do Not Resusucitate.  Goals of Care: Goals of care include to maximize quality of life and symptom management. Patient is interested in hospice service in the future when she qualifies for it.   Symptom management/Plan:  Fall: Fall twice 11/22/22, seen in the ED, no injuries, not admitted.  OT/Pt is recommended for strengthening and mobility. Use rolling walker for support. Education to declutter, on slow position changes.  Fall precautions. Appointment with PCP 12/31/22. Neurologist in Jan 14 2023.   Depression:  Managed with Zoloft.   Psych consult as needed.   Encourage socialization and participation in facility activities - Tai chi, art class, book club and  others.  Patient said she will start Tai chi class organized by the facility.   Protein Caloric Malnutrition. Current weight (self reported) is 104 Ibs 12/24/22, 96 Ibs April '24, 102 Ibs April '23. Height 5 feet 1 inch. Continue with Boost BID, healthy snacks in between meals. Routine CBC CMP.  Low Back pain: Stable. Patient reports she uses OTC Voltaren gel prn and it is effective.    Follow up: Palliative care will continue to follow for complex medical decision making, advance care planning, and clarification of goals. Return 6 weeks or prn.Encouraged to call provider sooner with any concerns.    Family/Caregiver/Community Supports: Patient lives in [place]; her son [name] lives in [place]. She participates in health and wellness programs Patient has limited hours personal care services from [facility].   HOSPICE ELIGIBILITY/DIAGNOSIS: TBD   Chief Complaint: Follow up visit   HISTORY OF PRESENT ILLNESS:  [name] is a 88 y.o. year old female  with multiple medical conditions including  recent fall secondary to gait disturbance for which she was seen in the ED, no injuries, not admitted. She denies pain/discomfort, no chest pain, no abdominal pain, no dizziness, no headaches and no shortness of breath, no memory loss/confusion no dizziness. History of protein caloric malnutrition, weight loss. Recent left eye surgery end of April '24 for glaucoma. She endorsed weakness and sometimes moody. No suicidal ideation. She said she will start the Tai chi class from Tuesday next week.  History of  arthritis, low back pain, osteoarthritis, hypertension, GERD. History obtained from review of EMR, discussion with primary team, family and/or patient. Records reviewed and summarized above. All  10 point systems reviewed and are negative except as documented in history of present illness above  Review and summarization of Epic records shows history from other than patient.   Palliative Care was asked to  follow this patient to help address complex decision making in the context of advance care planning and goals of care clarification.   PHYSICAL EXAM:   GEN: Frail, in no acute distress  Cardiac: S1 S2, no edema in BLE Respiratory: clear to auscultation, no wheezing, no cough GI: soft, nontender, nondistended, + BS   MS: ambulatory with rolling walker, unsteady gait  Skin: warm and dry, no rash to visible skin Neuro: generalized weakness, A and O x 3 Psych: cooperative  PERTINENT MEDICATIONS:  Outpatient Encounter Medications as of 12/25/2022  Medication Sig   BIOTIN PO Take by mouth daily.   brimonidine (ALPHAGAN) 0.15 % ophthalmic solution INT 1 GTT INTO OU BID   cholecalciferol (VITAMIN D) 1000 UNITS tablet Take 1,000 Units by mouth 2 (two) times daily.    diclofenac sodium (VOLTAREN) 1 % GEL Apply 2 g topically 4 (four) times daily.   dorzolamide-timolol (COSOPT) 22.3-6.8 MG/ML ophthalmic solution Place 1 drop into both eyes daily.   gabapentin (NEURONTIN) 300 MG capsule Take 1 capsule (300 mg total) by mouth 2 (two) times daily.   latanoprost (XALATAN) 0.005 % ophthalmic solution Place 1 drop into both eyes at bedtime.    lisinopril (ZESTRIL) 20 MG tablet Take 1 tablet (20 mg total) by mouth daily.   Menaquinone-7 (VITAMIN K2 PO) Take 1 capsule by mouth daily.   omeprazole (PRILOSEC) 20 MG capsule TAKE 1 CAPSULE DAILY       (DISCONTINUE PREVACID)   sertraline (ZOLOFT) 100 MG tablet Take 1.5 tablets (150 mg total) by mouth daily.   No facility-administered encounter medications on file as of 12/25/2022.    HOSPICE ELIGIBILITY/DIAGNOSIS: TBD  PAST MEDICAL HISTORY:  Past Medical History:  Diagnosis Date   Arthritis    Osteoarthritis   Depression    GERD (gastroesophageal reflux disease)    Hyperlipidemia    Osteopenia       ALLERGIES:  Allergies  Allergen Reactions   Penicillins Rash      I spent 60 minutes providing this consultation; this includes time spent with  patient/family, chart review and documentation. More than 50% of the time in this consultation was spent on counseling and coordinating communication   Thank you for the opportunity to participate in the care of [name] Please call our office at [phone] if we can be of additional assistance.  Note: Portions of this note were generated with Scientist, clinical (histocompatibility and immunogenetics). Dictation errors may occur despite best attempts at proofreading.  Rosaura Carpenter, NP

## 2022-12-30 ENCOUNTER — Telehealth: Payer: Self-pay

## 2022-12-30 DIAGNOSIS — I10 Essential (primary) hypertension: Secondary | ICD-10-CM

## 2022-12-30 DIAGNOSIS — E1169 Type 2 diabetes mellitus with other specified complication: Secondary | ICD-10-CM

## 2022-12-31 ENCOUNTER — Ambulatory Visit: Payer: Medicare Other | Admitting: Family Medicine

## 2023-01-01 ENCOUNTER — Telehealth: Payer: Self-pay | Admitting: *Deleted

## 2023-01-01 NOTE — Progress Notes (Signed)
  Care Coordination   Note   01/01/2023 Name: ADILENNE AHLES MRN: [id number] DOB: [birth year]  AMANDAMARIE FRANKFORD is a 89 y.o. year old female who sees Frederica Kuster, MD for primary care. I reached out to [name] by phone today to offer care coordination services.  Ms. Delacueva was given information about Care Coordination services today including:   The Care Coordination services include support from the care team which includes your Nurse Coordinator, Clinical Social Worker, or Pharmacist.  The Care Coordination team is here to help remove barriers to the health concerns and goals most important to you. Care Coordination services are voluntary, and the patient may decline or stop services at any time by request to their care team member.   Care Coordination Consent Status: Patient agreed to services and verbal consent obtained.   Follow up plan:  Telephone appointment with care coordination team member scheduled for:  01/02/23  Encounter Outcome:  Pt. Scheduled [facility] Coordination Care Guide  Direct Dial: [phone]

## 2023-01-02 ENCOUNTER — Other Ambulatory Visit: Payer: Medicare Other | Admitting: Pharmacist

## 2023-01-02 ENCOUNTER — Ambulatory Visit: Payer: Self-pay | Admitting: Licensed Clinical Social Worker

## 2023-01-02 NOTE — Progress Notes (Signed)
01/02/2023 Name: [name] MRN: [id number] DOB: 02-10-1927  Chief Complaint  Patient presents with   Medication Management   [name] is a 89 y.o. year old female who presented for a telephone visit.   They were referred to the pharmacist by their Case Management Team  for assistance in managing medication access and pill pack request .   Subjective:  Care Team: Primary Care Provider: Frederica Kuster, MD  Medication Access/Adherence  Current Pharmacy:  [facility] - [place], [place] - [address] [place] [address] Phone: [phone] Fax: [phone]  [facility]- [place], [place] - [address] [place] [address] Phone: [phone] Fax: [phone]  [facility] [phone] [place], [place] - [address] [facility] AT [facility] [address] [place] [address] [place] [address] Phone: [phone] Fax: [phone]  [facility] - [place], [place] - [facility] AT Portal to Registered [address] [place] [address] Phone: [phone] Fax: [phone]  [facility] - [place], [place] - [address] [place] [address] Phone: [phone] Fax: [phone]  [id number] [place], [place] - [address] AT [facility] [address] [place] [address] Phone: [phone] Fax: [phone]   Patient reports affordability concerns with their medications: Yes  Patient reports access/transportation concerns to their pharmacy: Yes  Patient reports adherence concerns with their medications:  No     Medication Management:  Current adherence strategy: pulls out of bottles one by one, but is difficult for her dexterity and notes she struggles to open bottles herself. Today she is interested in  pillpack  Patient reports Good adherence to medications  Patient reports the following barriers to adherence: none at present   Objective:  Lab Results  Component Value Date   HGBA1C 5.6 06/04/2020    Lab Results  Component Value Date   CREATININE 0.66 11/22/2022   BUN 16 11/22/2022   NA 140 11/22/2022   K 3.5 11/22/2022   CL 103 11/22/2022   CO2 27 11/22/2022    Lab Results  Component Value Date   CHOL 229 (H) 06/04/2020   HDL 53 06/04/2020   LDLCALC 143 (H) 06/04/2020   TRIG 191 (H) 06/04/2020   CHOLHDL 4.3 06/04/2020    Medications Reviewed Today     Reviewed by [name], RPH (Pharmacist) on 01/02/23 at 1440  Med List Status: <None>   Medication Order Taking? Sig Documenting Provider Last Dose Status Informant  BIOTIN PO [id number] Yes Take by mouth daily. [provider] Taking Active   brimonidine (ALPHAGAN) 0.15 % ophthalmic solution [id number] Yes INT 1 GTT INTO OU BID [provider] Taking Active   cholecalciferol (VITAMIN D) 1000 UNITS tablet [id number] Yes Take 1,000 Units by mouth 2 (two) times daily.  [provider] Taking Active Self  diclofenac sodium (VOLTAREN) 1 % GEL [id number] Yes Apply 2 g topically 4 (four) times daily. Kerrin Champagne, MD Taking Active   dorzolamide-timolol (COSOPT) 22.3-6.8 MG/ML ophthalmic solution [id number] Yes Place 1 drop into both eyes daily. [provider] Taking Active Self  gabapentin (NEURONTIN) 300 MG capsule [id number] Yes Take 1 capsule (300 mg total) by mouth 2 (two) times daily. Frederica Kuster, MD Taking Active   latanoprost (XALATAN) 0.005 % ophthalmic solution [id number] Yes Place 1  drop into both eyes at bedtime.  [provider] Taking Active Self  lisinopril (ZESTRIL) 20 MG tablet [id number] Yes Take 1 tablet (20 mg total) by mouth daily. Frederica Kuster, MD Taking Active   omeprazole (PRILOSEC) 20 MG capsule [id number] Yes TAKE 1 CAPSULE DAILY       (DISCONTINUE  PREVACID) Frederica Kuster, MD Taking Active   sertraline (ZOLOFT) 100 MG tablet [id number] Yes Take 1.5 tablets (150 mg total) by mouth daily. Frederica Kuster, MD Taking Active               Assessment/Plan:   Medication Management: - Currently strategy sufficient to maintain appropriate adherence to prescribed medication regimen - Coordinating with pharmacy team to arrange pillpack through [facility] health dispensing pharmacy  - As a backup, will [facility] to arrange for easy open pill bottles if pillpack has any delays with arrangement. - Discussed collaboration with local pharmacies for adherence packaging. Will coordinate.    Follow Up Plan: 3-5 days  Lynnda Shields, PharmD, BCPS Clinical Pharmacist [facility]

## 2023-01-05 ENCOUNTER — Other Ambulatory Visit (HOSPITAL_COMMUNITY): Payer: Self-pay

## 2023-01-05 ENCOUNTER — Other Ambulatory Visit: Payer: Self-pay

## 2023-01-05 NOTE — Patient Instructions (Signed)
Visit Information  Thank you for taking time to visit with me today. Please don't hesitate to contact me if I can be of assistance to you.   Following are the goals we discussed today:   Goals Addressed             This Visit's Progress    LCSW-Obtain Supportive Resources   On track    Activities and task to complete in order to accomplish goals.   Keep all upcoming appointments discussed today Continue with compliance of taking medication prescribed by Doctor Implement healthy coping skills discussed to assist with management of symptoms Continue working with [facility] care team to assist with goals identified          Our next appointment is by telephone on 5/24 at 11:45 AM  Please call the care guide team at [phone] if you need to cancel or reschedule your appointment.   If you are experiencing a Mental Health or Behavioral Health Crisis or need someone to talk to, please call the Suicide and Crisis Lifeline: 988 call 911   Patient verbalizes understanding of instructions and care plan provided today and agrees to view in MyChart. Active MyChart status and patient understanding of how to access instructions and care plan via MyChart confirmed with patient.     [name], MSW, LCSW [facility] Care Management [place]  [email or web address] Phone [phone] 6:59 PM

## 2023-01-05 NOTE — Patient Outreach (Signed)
  Care Coordination   Initial Visit Note   01/05/2023 Name: [name] MRN: [id number] DOB: 10-12-1926  [name] is a 89 y.o. year old female who sees Frederica Kuster, MD for primary care. I spoke with  [name] by phone today.  What matters to the patients health and wellness today?  Supportive resources    Goals Addressed             This Visit's Progress    LCSW-Obtain Supportive Resources   On track    Activities and task to complete in order to accomplish goals.   Keep all upcoming appointments discussed today Continue with compliance of taking medication prescribed by Doctor Implement healthy coping skills discussed to assist with management of symptoms Continue working with [facility] care team to assist with goals identified          SDOH assessments and interventions completed:  Yes  SDOH Interventions Today    Flowsheet Row Most Recent Value  SDOH Interventions   Food Insecurity Interventions Intervention Not Indicated  Housing Interventions Intervention Not Indicated  Transportation Interventions Intervention Not Indicated        Care Coordination Interventions:  Yes, provided  Interventions Today    Flowsheet Row Most Recent Value  Chronic Disease   Chronic disease during today's visit Hypertension (HTN), Diabetes  [MDD and GAD]  General Interventions   General Interventions Discussed/Reviewed General Interventions Discussed, Doctor Visits  [LCSW introduced self and explained role in care coordination. Reviewed upcoming appts. Assessed support system and discussed strategies to strengthen. Receives transporation through [employer]]  Doctor Visits Discussed/Reviewed Doctor Visits Discussed  Mental Health Interventions   Mental Health Discussed/Reviewed Mental Health Discussed, Anxiety, Depression, Coping Strategies  Nutrition Interventions   Nutrition Discussed/Reviewed Nutrition Discussed  [Son has groceries delivered to patient and she has an aid to  provide assistance with prepping food, if needed]  Pharmacy Interventions   Pharmacy Dicussed/Reviewed Pharmacy Topics Discussed, Medication Adherence  [Pt reports interest in pill pack due to difficulty opening pill bottles. Has an upcoming appt with Pharm D]  Safety Interventions   Safety Discussed/Reviewed Safety Discussed       Follow up plan: Follow up call scheduled for 2-4 weeks    Encounter Outcome:  Pt. Visit Completed   [name], MSW, LCSW [facility] Care Management [facility]  [facility] [email or web address] Phone [phone] 6:58 PM

## 2023-01-06 ENCOUNTER — Other Ambulatory Visit (HOSPITAL_COMMUNITY): Payer: Self-pay

## 2023-01-06 MED ORDER — ROCKLATAN 0.02-0.005 % OP SOLN
1.0000 [drp] | Freq: Every evening | OPHTHALMIC | 2 refills | Status: AC
  Filled 2023-01-06: qty 2.5, 50d supply, fill #0
  Filled 2023-01-15: qty 5, 100d supply, fill #0
  Filled 2023-01-26 – 2023-05-05 (×4): qty 5, 90d supply, fill #0

## 2023-01-06 MED ORDER — BRIMONIDINE TARTRATE 0.2 % OP SOLN
1.0000 [drp] | Freq: Two times a day (BID) | OPHTHALMIC | 2 refills | Status: AC
  Filled 2023-01-06: qty 5, 50d supply, fill #0
  Filled 2023-01-15: qty 20, 200d supply, fill #0
  Filled 2023-01-26: qty 10, 90d supply, fill #0
  Filled 2023-03-03 – 2023-03-11 (×3): qty 5, 50d supply, fill #0
  Filled 2023-04-27: qty 5, 50d supply, fill #1
  Filled 2023-06-13: qty 5, 50d supply, fill #2
  Filled 2023-08-03: qty 5, 50d supply, fill #3

## 2023-01-07 ENCOUNTER — Other Ambulatory Visit (HOSPITAL_COMMUNITY): Payer: Self-pay

## 2023-01-07 ENCOUNTER — Encounter: Payer: Self-pay | Admitting: Pharmacist

## 2023-01-07 ENCOUNTER — Other Ambulatory Visit: Payer: Self-pay

## 2023-01-07 MED ORDER — OMEPRAZOLE 20 MG PO CPDR
20.0000 mg | DELAYED_RELEASE_CAPSULE | Freq: Every day | ORAL | 1 refills | Status: DC
  Filled 2023-01-15: qty 30, 30d supply, fill #0
  Filled 2023-01-26 – 2023-01-28 (×2): qty 90, 90d supply, fill #0
  Filled 2023-03-03 (×2): qty 30, 30d supply, fill #0
  Filled 2023-04-02: qty 30, 30d supply, fill #1

## 2023-01-07 MED ORDER — GABAPENTIN 300 MG PO CAPS
300.0000 mg | ORAL_CAPSULE | Freq: Two times a day (BID) | ORAL | 1 refills | Status: DC
  Filled 2023-01-15: qty 60, 30d supply, fill #0
  Filled 2023-01-26 – 2023-01-28 (×2): qty 180, 90d supply, fill #0
  Filled 2023-03-03 (×2): qty 60, 30d supply, fill #0
  Filled 2023-04-02: qty 60, 30d supply, fill #1

## 2023-01-07 MED ORDER — LISINOPRIL 20 MG PO TABS: 20.0000 mg | ORAL_TABLET | Freq: Every day | ORAL | 1 refills | Status: DC

## 2023-01-07 MED ORDER — DORZOLAMIDE HCL-TIMOLOL MAL 2-0.5 % OP SOLN
1.0000 [drp] | Freq: Two times a day (BID) | OPHTHALMIC | 2 refills | Status: AC
  Filled 2023-01-07: qty 10, 50d supply, fill #0
  Filled 2023-01-15: qty 30, 300d supply, fill #0
  Filled 2023-01-26: qty 10, 100d supply, fill #0
  Filled 2023-03-03: qty 10, 90d supply, fill #0
  Filled 2023-05-05 – 2023-05-11 (×2): qty 10, 90d supply, fill #1
  Filled 2023-08-24: qty 10, 90d supply, fill #2

## 2023-01-07 MED ORDER — SERTRALINE HCL 100 MG PO TABS: 150.0000 mg | ORAL_TABLET | Freq: Every day | ORAL | 1 refills | Status: DC

## 2023-01-09 ENCOUNTER — Telehealth: Payer: Self-pay | Admitting: *Deleted

## 2023-01-09 ENCOUNTER — Encounter: Payer: Self-pay | Admitting: Licensed Clinical Social Worker

## 2023-01-09 ENCOUNTER — Other Ambulatory Visit: Payer: Self-pay

## 2023-01-09 NOTE — Progress Notes (Signed)
  Care Coordination Note  01/09/2023 Name: [name] MRN: [id number] DOB: Mar 19, 1927  [name] is a 89 y.o. year old female who is a primary care patient of Frederica Kuster, MD and is actively engaged with the care management team. I reached out to [name] by phone today to assist with re-scheduling a follow up visit with the Licensed Clinical Social Worker  Follow up plan: Unsuccessful telephone outreach attempt made. A HIPAA compliant phone message was left for the patient providing contact information and requesting a return call.   [facility]  Care Coordination Care Guide  Direct Dial: [phone]

## 2023-01-09 NOTE — Progress Notes (Signed)
  Care Coordination Note  01/09/2023 Name: [name] MRN: [id number] DOB: Jul 15, 1927  [name] is a 89 y.o. year old female who is a primary care patient of Frederica Kuster, MD and is actively engaged with the care management team. I reached out to [name] by phone today to assist with re-scheduling a follow up visit with the Licensed Clinical Social Worker  Follow up plan: Telephone appointment with care management team member scheduled for:01/22/23  [facility]  Care Coordination Care Guide  Direct Dial: [phone]

## 2023-01-14 ENCOUNTER — Ambulatory Visit: Payer: Medicare Other | Admitting: Neurology

## 2023-01-14 ENCOUNTER — Encounter: Payer: Self-pay | Admitting: Neurology

## 2023-01-14 VITALS — BP 138/74 | HR 74 | Ht 60.0 in | Wt 98.5 lb

## 2023-01-14 DIAGNOSIS — G3184 Mild cognitive impairment, so stated: Secondary | ICD-10-CM | POA: Diagnosis not present

## 2023-01-14 NOTE — Progress Notes (Signed)
[facility]  PATIENT: [name] DOB: 12-Jan-1927  REQUESTING CLINICIAN: Terrilee Files, MD HISTORY FROM: Patient  REASON FOR VISIT: Memory loss    HISTORICAL  CHIEF COMPLAINT:  Chief Complaint  Patient presents with   New Patient (Initial Visit)    Rm12, alone Memory loss: stated son thinks she has issues w/remembering, MMSE:23      HISTORY OF PRESENT ILLNESS:  This is a 89 year old woman past medical history of hypertension, hyperlipidemia, osteoarthritis, GERD, depression who is presenting at the request of his son to be evaluated for memory.  Patient reports that she noticed that her memory is not what it used to be, she is forgetful she misplaces items but she is independent.  She does live at the independent living facility and has a nurse/helper that comes twice a week for 2 hours.  She reports that he son got concerned when she was telling him that one of the nurse helper was hiding things from her, writing notes and just hiding her belongings.  She told her son but reported her son did not believe her and her son wanted her to be evaluated She reports at baseline she is independent, she is limited by pain and she does not drive, last time she drove was more than 7 years ago, she just gave up driving, denies any accident or being lost in familiar places.  She does not need any help with cooking, bathing, self-care or paying her bills.    TBI:   No past history of TBI Stroke:   no past history of stroke Seizures:   no past history of seizures Sleep:   no history of sleep apnea.  Mood: Yes, she is on Sertraline  Family history of Dementia:   Denies  Functional status: independent in all ADLs and IADLs but limited by pain  Patient lives alone in an independent living facility  Cooking: patient  Cleaning: has a helper twice a week for 2 hours  Shopping: Son Bathing: patient  Toileting: patient  Driving: Not driving, last time was 7 year ago   Bills: patient  Medications: patient  Ever left the stove on by accident?: Denies  Forget how to use items around the house?: Denies  Getting lost going to familiar places?: Denies  Forgetting loved ones names?: Denies  Word finding difficulty? Denies  Sleep: OK   OTHER MEDICAL CONDITIONS: Hypertension, Osteoarthritis, GERD    REVIEW OF SYSTEMS: Full 14 system review of systems performed and negative with exception of: As noted in the HPI   ALLERGIES: Allergies  Allergen Reactions   Penicillins Rash    HOME MEDICATIONS: Outpatient Medications Prior to Visit  Medication Sig Dispense Refill   BIOTIN PO Take by mouth daily.     brimonidine (ALPHAGAN) 0.15 % ophthalmic solution INT 1 GTT INTO OU BID  3   brimonidine (ALPHAGAN) 0.2 % ophthalmic solution Place 1 drop into the right eye 2 (two) times daily. 20 mL 2   cholecalciferol (VITAMIN D) 1000 UNITS tablet Take 1,000 Units by mouth 2 (two) times daily.      diclofenac sodium (VOLTAREN) 1 % GEL Apply 2 g topically 4 (four) times daily. 350 g 2   dorzolamide-timolol (COSOPT) 2-0.5 % ophthalmic solution Place 1 drop into the right eye 2 (two) times daily. 30 mL 2   dorzolamide-timolol (COSOPT) 22.3-6.8 MG/ML ophthalmic solution Place 1 drop into both eyes daily.     gabapentin (NEURONTIN) 300 MG capsule Take 1 capsule (  300 mg total) by mouth 2 (two) times daily. 180 capsule 1   latanoprost (XALATAN) 0.005 % ophthalmic solution Place 1 drop into both eyes at bedtime.      lisinopril (ZESTRIL) 20 MG tablet Take 1 tablet (20 mg total) by mouth daily. 90 tablet 1   Netarsudil-Latanoprost (ROCKLATAN) 0.02-0.005 % SOLN Place 1 drop into the right eye every evening. 5 mL 2   omeprazole (PRILOSEC) 20 MG capsule Take 1 capsule (20 mg total) by mouth daily. 90 capsule 1   sertraline (ZOLOFT) 100 MG tablet Take 1.5 tablets (150 mg total) by mouth daily. 135 tablet 1   gabapentin (NEURONTIN) 300 MG capsule Take 1 capsule (300 mg total) by  mouth 2 (two) times daily. 180 capsule 1   lisinopril (ZESTRIL) 20 MG tablet Take 1 tablet (20 mg total) by mouth daily. 90 tablet 1   omeprazole (PRILOSEC) 20 MG capsule TAKE 1 CAPSULE DAILY       (DISCONTINUE PREVACID) 90 capsule 1   sertraline (ZOLOFT) 100 MG tablet Take 1.5 tablets (150 mg total) by mouth daily. 135 tablet 1   No facility-administered medications prior to visit.    PAST MEDICAL HISTORY: Past Medical History:  Diagnosis Date   Arthritis    Osteoarthritis   Depression    GERD (gastroesophageal reflux disease)    Hyperlipidemia    Osteopenia     PAST SURGICAL HISTORY: Past Surgical History:  Procedure Laterality Date   APPENDECTOMY  1936   CERVICAL FUSION  2004   COLON SURGERY  06/12/2010   Colonoscopy   EYE SURGERY Left 2024   FINGER SURGERY  1989   MELANOMA EXCISION  11/16/2017   forehead   SPINE SURGERY  2009   Back Fusion   TONSILLECTOMY  1941    FAMILY HISTORY: History reviewed. No pertinent family history.  SOCIAL HISTORY: Social History   Socioeconomic History   Marital status: Divorced    Spouse name: Not on file   Number of children: 2   Years of education: Not on file   Highest education level: Not on file  Occupational History   Not on file  Tobacco Use   Smoking status: Former   Smokeless tobacco: Never   Tobacco comments:    Quit at age 83   Vaping Use   Vaping Use: Never used  Substance and Sexual Activity   Alcohol use: Not Currently    Comment: Occasionally    Drug use: No   Sexual activity: Not Currently  Other Topics Concern   Not on file  Social History Narrative   Not on file   Social Determinants of Health   Financial Resource Strain: Low Risk  (03/29/2018)   Overall Financial Resource Strain (CARDIA)    Difficulty of Paying Living Expenses: Not hard at all  Food Insecurity: No Food Insecurity (01/02/2023)   Hunger Vital Sign    Worried About Running Out of Food in the Last Year: Never true    Ran Out of  Food in the Last Year: Never true  Transportation Needs: No Transportation Needs (01/02/2023)   PRAPARE - Administrator, Civil Service (Medical): No    Lack of Transportation (Non-Medical): No  Physical Activity: Inactive (03/29/2018)   Exercise Vital Sign    Days of Exercise per Week: 0 days    Minutes of Exercise per Session: 0 min  Stress: No Stress Concern Present (03/29/2018)   [employer] of Occupational Health - Occupational Stress Questionnaire  Feeling of Stress : Only a little  Social Connections: Moderately Isolated (03/29/2018)   Social Connection and Isolation Panel [NHANES]    Frequency of Communication with Friends and Family: More than three times a week    Frequency of Social Gatherings with Friends and Family: More than three times a week    Attends Religious Services: Never    Database administrator or Organizations: No    Attends Banker Meetings: Never    Marital Status: Widowed  Intimate Partner Violence: Not At Risk (03/29/2018)   Humiliation, Afraid, Rape, and Kick questionnaire    Fear of Current or Ex-Partner: No    Emotionally Abused: No    Physically Abused: No    Sexually Abused: No     PHYSICAL EXAM     GENERAL EXAM/CONSTITUTIONAL: Vitals:  Vitals:   01/14/23 1038  BP: 138/74  Pulse: 74  Weight: 98 lb 8 oz (44.7 kg)  Height: 5' (1.524 m)   Body mass index is 19.24 kg/m. Wt Readings from Last 3 Encounters:  01/14/23 98 lb 8 oz (44.7 kg)  11/22/22 96 lb (43.5 kg)  04/25/22 109 lb (49.4 kg)   Patient is in no distress; well developed, nourished and groomed; neck is supple  MUSCULOSKELETAL: Gait, strength, tone, movements noted in Neurologic exam below  NEUROLOGIC: MENTAL STATUS:     01/14/2023   10:39 AM 09/28/2020   11:39 AM 03/29/2018   11:13 AM  MMSE - Mini Mental State Exam  Orientation to time 5 5 3   Orientation to time comments  2022, Winter, 2/11, Friday, February   Orientation to Place 5 5 5    Orientation to Place-comments  [place], [place], [place], [employer].   Registration 3 3 3   Registration-comments  DLOW   Attention/ Calculation 1 4 5   Recall 2 1 3   Language- name 2 objects 2 2 2   Language- repeat 1 1 1   Language- follow 3 step command 2 3 3   Language- read & follow direction 1 1 1   Write a sentence 1 1 1   Copy design 0 0 0  Total score 23 26 27     CRANIAL NERVE:  2nd, 3rd, 4th, 6th- visual fields full to confrontation, extraocular muscles intact, no nystagmus 5th - facial sensation symmetric 7th - facial strength symmetric 8th - hearing intact 9th - palate elevates symmetrically, uvula midline 11th - shoulder shrug symmetric 12th - tongue protrusion midline  MOTOR:  normal bulk and tone, full strength in the BUE, BLE  SENSORY:  normal and symmetric to light touch  COORDINATION:  finger-nose-finger, fine finger movements normal  GAIT/STATION:  Slow, uses a walker      DIAGNOSTIC DATA (LABS, IMAGING, TESTING) - I reviewed patient records, labs, notes, testing and imaging myself where available.  Lab Results  Component Value Date   WBC 5.9 11/22/2022   HGB 13.1 11/22/2022   HCT 41.5 11/22/2022   MCV 89.6 11/22/2022   PLT 186 11/22/2022      Component Value Date/Time   NA 140 11/22/2022 1924   NA 143 01/21/2016 1011   K 3.5 11/22/2022 1924   CL 103 11/22/2022 1924   CO2 27 11/22/2022 1924   GLUCOSE 128 (H) 11/22/2022 1924   BUN 16 11/22/2022 1924   BUN 16 01/21/2016 1011   CREATININE 0.66 11/22/2022 1924   CREATININE 0.65 06/04/2020 1402   CALCIUM 9.8 11/22/2022 1924   PROT 5.9 (L) 06/04/2020 1402   PROT 5.8 (L) 01/21/2016 1011  ALBUMIN 4.0 12/02/2016 1012   ALBUMIN 4.0 01/21/2016 1011   AST 19 06/04/2020 1402   ALT 17 06/04/2020 1402   ALKPHOS 43 12/02/2016 1012   BILITOT 0.4 06/04/2020 1402   BILITOT 0.2 01/21/2016 1011   GFRNONAA >60 11/22/2022 1924   GFRNONAA 78 09/21/2019 1003   GFRAA 91 09/21/2019 1003   Lab  Results  Component Value Date   CHOL 229 (H) 06/04/2020   HDL 53 06/04/2020   LDLCALC 143 (H) 06/04/2020   TRIG 191 (H) 06/04/2020   CHOLHDL 4.3 06/04/2020   Lab Results  Component Value Date   HGBA1C 5.6 06/04/2020   Lab Results  Component Value Date   VITAMINB12 955 05/01/2016   Lab Results  Component Value Date   TSH 0.33 (L) 01/14/2022    CT Head 11/22/2022 1. No acute intracranial abnormality.    ASSESSMENT AND PLAN  89 y.o. year old female with history of hypothyroidism, hypertension, hyperlipidemia, GERD, osteoarthritis, depression who is presenting with complaint of memory problem and misplacing items.  Her sons believe that she has delusions about her helper hiding things from and writing her notes.  Today on exam she scored a 23 out of 30 on the MoCA negative indicative of impairment but she was also able to give me a honest history.  I do believe the patient does have mild cognitive impairment, we will get some dementia labs to rule out Alzheimer disease as etiology.  Her recent CT scan did not show any acute abnormality. I will contact the patient to go over the results otherwise I will see her in 1 year for follow-up.   1. Mild cognitive impairment      Patient Instructions  Dementia labs including TSH, B12, ATN profile to look for Alzheimer disease biomarker I will contact you to go over the results. Continue your other medications Continue to follow with PCP Follow-up in 1 year or sooner if worse.   There are well-accepted and sensible ways to reduce risk for Alzheimers disease and other degenerative brain disorders .  Exercise Daily Walk A daily 20 minute walk should be part of your routine. Disease related apathy can be a significant roadblock to exercise and the only way to overcome this is to make it a daily routine and perhaps have a reward at the end (something your loved one loves to eat or drink perhaps) or a personal trainer coming to the home can  also be very useful. Most importantly, the patient is much more likely to exercise if the caregiver / spouse does it with him/her. In general a structured, repetitive schedule is best.  General Health: Any diseases which effect your body will effect your brain such as a pneumonia, urinary infection, blood clot, heart attack or stroke. Keep contact with your primary care doctor for regular follow ups.  Sleep. A good nights sleep is healthy for the brain. Seven hours is recommended. If you have insomnia or poor sleep habits we can give you some instructions. If you have sleep apnea wear your mask.  Diet: Eating a heart healthy diet is also a good idea; fish and poultry instead of red meat, nuts (mostly non-peanuts), vegetables, fruits, olive oil or canola oil (instead of butter), minimal salt (use other spices to flavor foods), whole grain rice, bread, cereal and pasta and wine in moderation.Research is now showing that the MIND diet, which is a combination of The Mediterranean diet and the DASH diet, is beneficial for cognitive processing and  longevity. Information about this diet can be found in The MIND Diet, a book by Alonna Minium, MS, RDN, and online at [employer]Wild[employer].es  Finances, Power of [address] and Advance Directives: You should consider putting legal safeguards in place with regard to financial and medical decision making. While the spouse always has power of attorney for medical and financial issues in the absence of any form, you should consider what you want in case the spouse / caregiver is no longer around or capable of making decisions.    Orders Placed This Encounter  Procedures   Thyroid Panel With TSH   Vitamin B12   ATN PROFILE    No orders of the defined types were placed in this encounter.   Return in 1 year (on 01/14/2024).    Windell Norfolk, MD 01/14/2023, 10:29 PM  [facility] [address], [address] [place], [place]  [address] [phone]

## 2023-01-14 NOTE — Patient Instructions (Signed)
Dementia labs including TSH, B12, ATN profile to look for Alzheimer disease biomarker I will contact you to go over the results. Continue your other medications Continue to follow with PCP Follow-up in 1 year or sooner if worse.   There are well-accepted and sensible ways to reduce risk for Alzheimers disease and other degenerative brain disorders .  Exercise Daily Walk A daily 20 minute walk should be part of your routine. Disease related apathy can be a significant roadblock to exercise and the only way to overcome this is to make it a daily routine and perhaps have a reward at the end (something your loved one loves to eat or drink perhaps) or a personal trainer coming to the home can also be very useful. Most importantly, the patient is much more likely to exercise if the caregiver / spouse does it with him/her. In general a structured, repetitive schedule is best.  General Health: Any diseases which effect your body will effect your brain such as a pneumonia, urinary infection, blood clot, heart attack or stroke. Keep contact with your primary care doctor for regular follow ups.  Sleep. A good nights sleep is healthy for the brain. Seven hours is recommended. If you have insomnia or poor sleep habits we can give you some instructions. If you have sleep apnea wear your mask.  Diet: Eating a heart healthy diet is also a good idea; fish and poultry instead of red meat, nuts (mostly non-peanuts), vegetables, fruits, olive oil or canola oil (instead of butter), minimal salt (use other spices to flavor foods), whole grain rice, bread, cereal and pasta and wine in moderation.Research is now showing that the MIND diet, which is a combination of The Mediterranean diet and the DASH diet, is beneficial for cognitive processing and longevity. Information about this diet can be found in The MIND Diet, a book by Alonna Minium, MS, RDN, and online at [employer]  Finances,  Power of [address] and Advance Directives: You should consider putting legal safeguards in place with regard to financial and medical decision making. While the spouse always has power of attorney for medical and financial issues in the absence of any form, you should consider what you want in case the spouse / caregiver is no longer around or capable of making decisions.

## 2023-01-15 ENCOUNTER — Other Ambulatory Visit: Payer: Self-pay

## 2023-01-15 ENCOUNTER — Telehealth: Payer: Self-pay

## 2023-01-15 ENCOUNTER — Other Ambulatory Visit: Payer: Medicare Other | Admitting: Hospice

## 2023-01-15 DIAGNOSIS — Z515 Encounter for palliative care: Secondary | ICD-10-CM | POA: Diagnosis not present

## 2023-01-15 DIAGNOSIS — W19XXXD Unspecified fall, subsequent encounter: Secondary | ICD-10-CM

## 2023-01-15 DIAGNOSIS — F339 Major depressive disorder, recurrent, unspecified: Secondary | ICD-10-CM

## 2023-01-15 DIAGNOSIS — G3184 Mild cognitive impairment, so stated: Secondary | ICD-10-CM | POA: Diagnosis not present

## 2023-01-15 DIAGNOSIS — E440 Moderate protein-calorie malnutrition: Secondary | ICD-10-CM

## 2023-01-15 LAB — THYROID PANEL WITH TSH
Free Thyroxine Index: 1.7 (ref 1.2–4.9)
T3 Uptake Ratio: 30 % (ref 24–39)
T4, Total: 5.6 ug/dL (ref 4.5–12.0)
TSH: 0.457 u[IU]/mL (ref 0.450–4.500)

## 2023-01-15 LAB — VITAMIN B12: Vitamin B-12: 428 pg/mL (ref 232–1245)

## 2023-01-15 NOTE — Progress Notes (Signed)
Therapist, nutritional Palliative Care Consult Note Telephone: [phone]  Fax: [phone]  PATIENT NAME: [name] DOB: 17-Dec-1926 MRN: [id number]  PRIMARY CARE PROVIDER:   Frederica Kuster, MD Frederica Kuster, MD [address] [place],  [place] [address]  REFERRING PROVIDER: Frederica Kuster, MD Frederica Kuster, MD [address] [place],  [place] [address]  RESPONSIBLE PARTY:  Self [phone] [facility] - facility service coordinator [phone] office [phone] Caregiver [name] [phone] - Tues and Thurs 1-3pm Contact Information     Name Relation Home Work Mobile   [place] Son   [phone]      TELEHEALTH VISIT STATEMENT This visit was done via telemedicine from my office and it was initiated and consent by this patient and or family. Video-audio (telehealth) contact was unable to be done due to technical barriers from the patient's side. I connected with patient by a telephone  and verified that I am speaking with the correct person. I discussed the limitations of evaluation and management by telemedicine. The patient expressed understanding and agreed to proceed. Palliative Care was asked to follow this patient to address advance care planning, complex medical decision making and goals of care clarification.   Visit consisted of counseling and education dealing with the complex and emotionally intense issues of symptom management and palliative care in the setting of serious and potentially life-threatening illness. Therapeutic presence/listening and ample emotional support provided. Palliative care team will continue to support patient, patient's family, and medical team. RECOMMENDATIONS/PLAN:   Advance Care Planning/Code Status:Patient is a Do Not Resusucitate.  Goals of Care: Goals of care include to maximize quality of life and symptom management. Patient is interested in hospice service in the future when she qualifies for it.    Symptom management/Plan:  Fall: No fall since last visit.  Recent fall twice 11/22/22, seen in the ED, no injuries, not admitted.  OT/PT is recommended for strengthening and mobility. Use rolling walker for support. Education to declutter, on slow position changes.  Fall precautions. Appointment with PCP 01/22/23. Neurologist in Jan 14 2023.   Depression:  Managed with Zoloft.   Psych consult as needed.   Encourage socialization and participation in facility activities - Tai chi, art class, book club and others.  Patient said she will start Tai chi class organized by the facility.   Mild cognitive impairment: Visit with neurologist yesterday showed MMSE of 23.  Patient remains independent in ADLs.  Patient saw neurologist because son thought she was having issues with remembering.  No medication started by neurologist and no follow-up appointment at this time. Continue with reading, participating in facility activities, socialization, puzzles, reminiscence.  Protein Caloric Malnutrition. Current weight  98.9 Ibs 01/14/23 104 Ibs 12/24/22, 96 Ibs April '24, 102 Ibs April '23.  Height 5 feet 1 inch.  Continue with Boost BID, healthy snacks in between meals. Routine CBC CMP.  Low Back pain: Stable. Patient reports she uses OTC Voltaren gel prn and it is effective.    Follow up: Palliative care will continue to follow for complex medical decision making, advance care planning, and clarification of goals. Return 6 weeks or prn.Encouraged to call provider sooner with any concerns.    Family/Caregiver/Community Supports: Patient lives in [place]; her son [name] lives in [place]. She participates in health and wellness programs Patient has limited hours personal care services from [place].   HOSPICE ELIGIBILITY/DIAGNOSIS: TBD  Chief Complaint: Follow up visit   HISTORY OF PRESENT ILLNESS:  [name] is a 89 y.o. year old female  with multiple medical conditions including hypertension,  osteoarthritis, low back pain, GERD.  Recent fall secondary to gait disturbance for which she was seen in the ED in April 2024, no injuries, not admitted. She denies pain/discomfort, no dizziness. History of protein caloric malnutrition, weight loss. Recent left eye surgery end of April '24 for glaucoma. She reports good mood and GI as she was cleared by neurologist yesterday with no dementia.  History obtained from review of EMR, discussion with primary team, family and/or patient. Records reviewed and summarized above. All 10 point systems reviewed and are negative except as documented in history of present illness above  Review and summarization of Epic records shows history from other than patient.   Palliative Care was asked to follow this patient to help address complex decision making in the context of advance care planning and goals of care clarification.    PERTINENT MEDICATIONS:  Outpatient Encounter Medications as of 01/15/2023  Medication Sig   BIOTIN PO Take by mouth daily.   brimonidine (ALPHAGAN) 0.2 % ophthalmic solution Place 1 drop into the right eye 2 (two) times daily.   cholecalciferol (VITAMIN D) 1000 UNITS tablet Take 1,000 Units by mouth 2 (two) times daily.    diclofenac sodium (VOLTAREN) 1 % GEL Apply 2 g topically 4 (four) times daily.   dorzolamide-timolol (COSOPT) 2-0.5 % ophthalmic solution Place 1 drop into the right eye 2 (two) times daily.   gabapentin (NEURONTIN) 300 MG capsule Take 1 capsule (300 mg total) by mouth 2 (two) times daily.   lisinopril (ZESTRIL) 20 MG tablet Take 1 tablet (20 mg total) by mouth daily.   Netarsudil-Latanoprost (ROCKLATAN) 0.02-0.005 % SOLN Place 1 drop into the right eye every evening.   omeprazole (PRILOSEC) 20 MG capsule Take 1 capsule (20 mg total) by mouth daily.   sertraline (ZOLOFT) 100 MG tablet Take 1.5 tablets (150 mg total) by mouth daily.   No facility-administered encounter medications on file as of 01/15/2023.     HOSPICE ELIGIBILITY/DIAGNOSIS: TBD  PAST MEDICAL HISTORY:  Past Medical History:  Diagnosis Date   Arthritis    Osteoarthritis   Depression    GERD (gastroesophageal reflux disease)    Hyperlipidemia    Osteopenia       ALLERGIES:  Allergies  Allergen Reactions   Penicillins Rash      I spent  25 minutes providing this consultation; this includes time spent with patient/family, chart review and documentation. More than 50% of the time in this consultation was spent on counseling and coordinating communication   Thank you for the opportunity to participate in the care of TANIYLA DEARDEN Please call our office at [phone] if we can be of additional assistance.  Note: Portions of this note were generated with Scientist, clinical (histocompatibility and immunogenetics). Dictation errors may occur despite best attempts at proofreading.  Rosaura Carpenter, NP

## 2023-01-15 NOTE — Progress Notes (Signed)
   01/15/2023  Patient ID: [name], female   DOB: 09-12-1926, 89 y.o.   MRN: [id number]  Outreach attempt to follow-up in regard to medication adherence packaging and mail order through [facility].  Needing to verify active prescriptions to make sure pharmacy received scripts for everything.   Unable to reach patient but left voicemail with my direct number, so she can return my call at her convenience.  Will call again tomorrow if I do not hear back.  [name], PharmD, DPLA

## 2023-01-16 ENCOUNTER — Telehealth: Payer: Self-pay

## 2023-01-16 LAB — ATN PROFILE

## 2023-01-16 NOTE — Progress Notes (Unsigned)
   01/16/2023  Patient ID: [name], female   DOB: Jul 09, 1927, 89 y.o.   MRN: [id number]  Patient outreach attempt to finalize set up with [facility] for adherence packaging and mail order.  Unable to connect with patient, but left a voicemail with my direct number and also sending MyChart message for her to contact me at her convenience.  [name], PharmD, DPLA

## 2023-01-21 ENCOUNTER — Ambulatory Visit (INDEPENDENT_AMBULATORY_CARE_PROVIDER_SITE_OTHER): Payer: Medicare Other | Admitting: Family Medicine

## 2023-01-21 ENCOUNTER — Encounter: Payer: Self-pay | Admitting: Family Medicine

## 2023-01-21 VITALS — BP 138/82 | HR 92 | Temp 96.7°F | Resp 20 | Ht 60.0 in | Wt 97.4 lb

## 2023-01-21 DIAGNOSIS — I872 Venous insufficiency (chronic) (peripheral): Secondary | ICD-10-CM

## 2023-01-21 DIAGNOSIS — I10 Essential (primary) hypertension: Secondary | ICD-10-CM | POA: Diagnosis not present

## 2023-01-21 DIAGNOSIS — H409 Unspecified glaucoma: Secondary | ICD-10-CM | POA: Diagnosis not present

## 2023-01-21 LAB — ATN PROFILE

## 2023-01-21 NOTE — Progress Notes (Signed)
Provider:  Jacalyn Lefevre, MD  Careteam: Patient Care Team: Frederica Kuster, MD as PCP - General (Family Medicine) Sallye Lat, MD as Consulting Physician (Ophthalmology) Mathews Robinsons, MD as Consulting Physician (Dermatology) Bridgett Larsson, LCSW as Social Worker (Licensed Clinical Social Worker)  PLACE OF SERVICE:  [facility]  Advanced Directive information    Allergies  Allergen Reactions   Penicillins Rash    No chief complaint on file.    HPI: Patient is a 89 y.o. female this is a 1 year follow-up visit for this 89 year old female.  She lives in an apartment independently.  Does her own cooking.  Uses walker for ambulation there have been no falls weight is stable she has had several evaluations by neurology is also followed by palliative care. Evaluation at neurology was encouraged by her son who reported that he was concerned when patient told him her nurse helper was hiding things from her, writing notes and hiding her belongings.  Continues to be independent in all ADLs but ADLs are limited.  He no longer drives.  She is compliant with taking her medicines besides eyedrops she is on sertraline, omeprazole, lisinopril, and gabapentin.  Recent neurology visit demonstrated MMSE of 23/30.  Labs including thyroid and B12 are normal but blood work for Alzheimer's is still pending  Review of Systems:  Review of Systems  Constitutional: Negative.   HENT: Negative.    Respiratory: Negative.    Cardiovascular: Negative.   Musculoskeletal: Negative.   Skin: Negative.   Psychiatric/Behavioral:  Positive for depression and memory loss. The patient is nervous/anxious.   All other systems reviewed and are negative.   Past Medical History:  Diagnosis Date   Arthritis    Osteoarthritis   Depression    GERD (gastroesophageal reflux disease)    Hyperlipidemia    Osteopenia    Past Surgical History:  Procedure Laterality Date   APPENDECTOMY  1936   CERVICAL  FUSION  2004   COLON SURGERY  06/12/2010   Colonoscopy   EYE SURGERY Left 2024   FINGER SURGERY  1989   MELANOMA EXCISION  11/16/2017   forehead   SPINE SURGERY  2009   Back Fusion   TONSILLECTOMY  1941   Social History:   reports that she has quit smoking. She has never used smokeless tobacco. She reports that she does not currently use alcohol. She reports that she does not use drugs.  No family history on file.  Medications: Patient's Medications  New Prescriptions   No medications on file  Previous Medications   BIOTIN PO    Take by mouth daily.   BRIMONIDINE (ALPHAGAN) 0.2 % OPHTHALMIC SOLUTION    Place 1 drop into the right eye 2 (two) times daily.   CHOLECALCIFEROL (VITAMIN D) 1000 UNITS TABLET    Take 1,000 Units by mouth 2 (two) times daily.    DICLOFENAC SODIUM (VOLTAREN) 1 % GEL    Apply 2 g topically 4 (four) times daily.   DORZOLAMIDE-TIMOLOL (COSOPT) 2-0.5 % OPHTHALMIC SOLUTION    Place 1 drop into the right eye 2 (two) times daily.   GABAPENTIN (NEURONTIN) 300 MG CAPSULE    Take 1 capsule (300 mg total) by mouth 2 (two) times daily.   LISINOPRIL (ZESTRIL) 20 MG TABLET    Take 1 tablet (20 mg total) by mouth daily.   NETARSUDIL-LATANOPROST (ROCKLATAN) 0.02-0.005 % SOLN    Place 1 drop into the right eye every evening.   OMEPRAZOLE (PRILOSEC) 20  MG CAPSULE    Take 1 capsule (20 mg total) by mouth daily.   SERTRALINE (ZOLOFT) 100 MG TABLET    Take 1.5 tablets (150 mg total) by mouth daily.  Modified Medications   No medications on file  Discontinued Medications   No medications on file    Physical Exam:  There were no vitals filed for this visit. There is no height or weight on file to calculate BMI. Wt Readings from Last 3 Encounters:  01/14/23 98 lb 8 oz (44.7 kg)  11/22/22 96 lb (43.5 kg)  04/25/22 109 lb (49.4 kg)    Physical Exam Vitals and nursing note reviewed.  Constitutional:      Appearance: Normal appearance.  Eyes:     Extraocular Movements:  Extraocular movements intact.     Conjunctiva/sclera: Conjunctivae normal.  Cardiovascular:     Rate and Rhythm: Normal rate and regular rhythm.  Pulmonary:     Effort: Pulmonary effort is normal.     Breath sounds: Normal breath sounds.  Abdominal:     General: Bowel sounds are normal.     Palpations: Abdomen is soft.  Neurological:     General: No focal deficit present.     Mental Status: She is alert and oriented to person, place, and time.  Psychiatric:        Mood and Affect: Mood normal.        Behavior: Behavior normal.     Labs reviewed: Basic Metabolic Panel: Recent Labs    11/22/22 1924 01/14/23 1112  NA 140  --   K 3.5  --   CL 103  --   CO2 27  --   GLUCOSE 128*  --   BUN 16  --   CREATININE 0.66  --   CALCIUM 9.8  --   TSH  --  0.457   Liver Function Tests: No results for input(s): "AST", "ALT", "ALKPHOS", "BILITOT", "PROT", "ALBUMIN" in the last 8760 hours. No results for input(s): "LIPASE", "AMYLASE" in the last 8760 hours. No results for input(s): "AMMONIA" in the last 8760 hours. CBC: Recent Labs    11/22/22 1924  WBC 5.9  NEUTROABS 3.6  HGB 13.1  HCT 41.5  MCV 89.6  PLT 186   Lipid Panel: No results for input(s): "CHOL", "HDL", "LDLCALC", "TRIG", "CHOLHDL", "LDLDIRECT" in the last 8760 hours. TSH: Recent Labs    01/14/23 1112  TSH 0.457   A1C: Lab Results  Component Value Date   HGBA1C 5.6 06/04/2020     Assessment/Plan  1. Chronic venous insufficiency Patient does have some venous insufficiency and edema suggested behavioral changes such as decreased salt and elevation of feet and walking as tolerated  2. Essential hypertension, benign Pressure good today 138/82 on lisinopril 20 mg  3. Glaucoma of both eyes, unspecified glaucoma type Had ophthalmologic procedure some stenting to help pressures and her right eye.  A neighbor stayed with her but now she is suspicious about the neighbor stealing and taking things from her so  that potential for help is no longer present   Jacalyn Lefevre, MD [facility] [phone]

## 2023-01-22 ENCOUNTER — Encounter (HOSPITAL_COMMUNITY): Payer: Self-pay

## 2023-01-22 ENCOUNTER — Other Ambulatory Visit: Payer: Self-pay

## 2023-01-22 ENCOUNTER — Emergency Department (HOSPITAL_COMMUNITY): Payer: Medicare Other

## 2023-01-22 ENCOUNTER — Ambulatory Visit: Payer: Self-pay | Admitting: Licensed Clinical Social Worker

## 2023-01-22 ENCOUNTER — Emergency Department (HOSPITAL_COMMUNITY)
Admission: EM | Admit: 2023-01-22 | Discharge: 2023-01-23 | Disposition: A | Discharge status: Home / Self Care | Payer: Medicare Other | Attending: Emergency Medicine | Admitting: Emergency Medicine

## 2023-01-22 DIAGNOSIS — C4339 Malignant melanoma of other parts of face: Secondary | ICD-10-CM | POA: Diagnosis not present

## 2023-01-22 DIAGNOSIS — E1165 Type 2 diabetes mellitus with hyperglycemia: Secondary | ICD-10-CM | POA: Insufficient documentation

## 2023-01-22 DIAGNOSIS — M546 Pain in thoracic spine: Secondary | ICD-10-CM | POA: Insufficient documentation

## 2023-01-22 DIAGNOSIS — M4316 Spondylolisthesis, lumbar region: Secondary | ICD-10-CM | POA: Diagnosis not present

## 2023-01-22 DIAGNOSIS — W19XXXA Unspecified fall, initial encounter: Secondary | ICD-10-CM | POA: Diagnosis not present

## 2023-01-22 DIAGNOSIS — M545 Low back pain, unspecified: Secondary | ICD-10-CM | POA: Diagnosis not present

## 2023-01-22 DIAGNOSIS — I10 Essential (primary) hypertension: Secondary | ICD-10-CM | POA: Insufficient documentation

## 2023-01-22 DIAGNOSIS — S0990XA Unspecified injury of head, initial encounter: Secondary | ICD-10-CM | POA: Insufficient documentation

## 2023-01-22 DIAGNOSIS — Z87891 Personal history of nicotine dependence: Secondary | ICD-10-CM | POA: Diagnosis not present

## 2023-01-22 DIAGNOSIS — R42 Dizziness and giddiness: Secondary | ICD-10-CM | POA: Diagnosis not present

## 2023-01-22 DIAGNOSIS — S79911A Unspecified injury of right hip, initial encounter: Secondary | ICD-10-CM | POA: Diagnosis not present

## 2023-01-22 DIAGNOSIS — M40204 Unspecified kyphosis, thoracic region: Secondary | ICD-10-CM | POA: Diagnosis not present

## 2023-01-22 DIAGNOSIS — Z043 Encounter for examination and observation following other accident: Secondary | ICD-10-CM | POA: Diagnosis not present

## 2023-01-22 DIAGNOSIS — M47814 Spondylosis without myelopathy or radiculopathy, thoracic region: Secondary | ICD-10-CM | POA: Diagnosis not present

## 2023-01-22 LAB — COMPREHENSIVE METABOLIC PANEL
ALT: 17 U/L (ref 0–44)
AST: 18 U/L (ref 15–41)
Albumin: 3.8 g/dL (ref 3.5–5.0)
Alkaline Phosphatase: 48 U/L (ref 38–126)
Anion gap: 11 (ref 5–15)
BUN: 26 mg/dL — ABNORMAL HIGH (ref 8–23)
CO2: 22 mmol/L (ref 22–32)
Calcium: 9.3 mg/dL (ref 8.9–10.3)
Chloride: 106 mmol/L (ref 98–111)
Creatinine, Ser: 0.65 mg/dL (ref 0.44–1.00)
GFR, Estimated: >60 mL/min (ref >60)
Glucose, Bld: 85 mg/dL (ref 70–99)
Potassium: 3.8 mmol/L (ref 3.5–5.1)
Sodium: 139 mmol/L (ref 135–145)
Total Bilirubin: 0.5 mg/dL (ref 0.3–1.2)
Total Protein: 6.5 g/dL (ref 6.5–8.1)

## 2023-01-22 LAB — ATN PROFILE
Beta-amyloid 40: 160.47 pg/mL
N -- NfL, Plasma: 13.4 pg/mL — ABNORMAL HIGH (ref 0.00–11.55)

## 2023-01-22 LAB — CBC
HCT: 40.9 % (ref 36.0–46.0)
Hemoglobin: 13.1 g/dL (ref 12.0–15.0)
MCH: 29.2 pg (ref 26.0–34.0)
MCHC: 32 g/dL (ref 30.0–36.0)
MCV: 91.1 fL (ref 80.0–100.0)
Platelets: 161 10*3/uL (ref 150–400)
RBC: 4.49 MIL/uL (ref 3.87–5.11)
RDW: 13.7 % (ref 11.5–15.5)
WBC: 5 10*3/uL (ref 4.0–10.5)
nRBC: 0 % (ref 0.0–0.2)

## 2023-01-22 MED ORDER — ACETAMINOPHEN 325 MG PO TABS
650.0000 mg | ORAL_TABLET | Freq: Once | ORAL | Status: AC
  Administered 2023-01-22: 650 mg via ORAL
  Filled 2023-01-22: qty 2

## 2023-01-22 NOTE — ED Provider Notes (Signed)
[place] EMERGENCY DEPARTMENT AT [facility] Provider Note  CSN: [id number] Arrival date & time: 01/22/23 1904  Chief Complaint(s) Fall  HPI [name] is a 89 y.o. female with past medical history as below, significant for arthritis, depression, GERD, HLD, walker dependence who presents to the ED with complaint of fall.  Patient resides at home, she lives alone.  Reports that she was doing some chores in her house and that her walker to the bathroom.  She attempted to ambulate to the bathroom unassisted, her knees felt weak, she attempted to grab the shower curtain but fell she fell to the ground.  Hit the back of her head, upper part of her neck, toilet.  No LOC.  Pain to her right hip.  She has chronic weakness in her knees and uses a walker at baseline.  Denies LOC, no chest pain, nausea or vomiting.  She has discomfort at the back of her head where she hit the toilet.  Back pain diffusely.  Pain to her right hip.  No numbness or tingling.  Past Medical History Past Medical History:  Diagnosis Date   Arthritis    Osteoarthritis   Depression    GERD (gastroesophageal reflux disease)    Hyperlipidemia    Osteopenia    Patient Active Problem List   Diagnosis Date Noted   GERD without esophagitis 03/08/2021   Chronic idiopathic constipation 03/08/2021   Moderate episode of recurrent major depressive disorder (HCC) 08/15/2019   Generalized anxiety disorder 08/15/2019   Recurrent major depressive disorder, in partial remission (HCC) 03/24/2019   Generalized osteoarthritis of multiple sites 03/24/2019   Hyperlipidemia associated with type 2 diabetes mellitus (HCC) 10/11/2018   Need for influenza vaccination 04/01/2018   Malignant melanoma of left temple (HCC) 11/26/2017   Chronic venous insufficiency 07/26/2015   Urge incontinence of urine 07/26/2015   Glaucoma 07/26/2015   Postmenopausal estrogen deficiency 07/26/2015   Primary open angle glaucoma 01/02/2014    Hyperglycemia 11/18/2012   Hypertriglyceridemia, essential 11/18/2012   Essential hypertension, benign 11/18/2012   Peripheral neuropathy 11/18/2012   Home Medication(s) Prior to Admission medications   Medication Sig Start Date End Date Taking? Authorizing Provider  acetaminophen (TYLENOL) 325 MG tablet Take 2 tablets (650 mg total) by mouth every 6 (six) hours as needed. 01/23/23  Yes Tanda Rockers A, DO  ADVIL 200 MG CAPS Take 200 mg by mouth every 8 (eight) hours as needed (for pain or headaches).   Yes [provider]  BIOTIN PO Take 1 capsule by mouth daily.   Yes [provider]  brimonidine (ALPHAGAN) 0.2 % ophthalmic solution Place 1 drop into the right eye 2 (two) times daily. 12/02/22  Yes   diclofenac sodium (VOLTAREN) 1 % GEL Apply 2 g topically 4 (four) times daily. Patient taking differently: Apply 2 g topically every 4 (four) hours as needed (for pain- affected sites). 03/25/19  Yes Kerrin Champagne, MD  dorzolamide-timolol (COSOPT) 2-0.5 % ophthalmic solution Place 1 drop into the right eye 2 (two) times daily. 12/02/22  Yes   gabapentin (NEURONTIN) 300 MG capsule Take 1 capsule (300 mg total) by mouth 2 (two) times daily. Patient taking differently: Take 300-600 mg by mouth See admin instructions. Take 300 mg by mouth in the morning and 600 mg at bedtime 11/21/22  Yes Frederica Kuster, MD  lisinopril (ZESTRIL) 20 MG tablet Take 1 tablet (20 mg total) by mouth daily. 11/21/22  Yes Frederica Kuster, MD  methocarbamol (  ROBAXIN) 500 MG tablet Take 2 tablets (1,000 mg total) by mouth 2 (two) times daily for 5 days. 01/23/23 01/28/23 Yes Tanda Rockers A, DO  Netarsudil-Latanoprost (ROCKLATAN) 0.02-0.005 % SOLN Place 1 drop into the right eye every evening. Patient taking differently: Place 1 drop into the right eye at bedtime. 12/02/22  Yes   omeprazole (PRILOSEC) 20 MG capsule Take 1 capsule (20 mg total) by mouth daily. Patient taking differently: Take 20 mg by mouth daily  before breakfast. 11/21/22  Yes Frederica Kuster, MD  prednisoLONE acetate (PRED FORTE) 1 % ophthalmic suspension Place 1 drop into the left eye 2 (two) times daily.   Yes [provider]  sertraline (ZOLOFT) 100 MG tablet Take 1.5 tablets (150 mg total) by mouth daily. Patient taking differently: Take 100 mg by mouth at bedtime. 11/21/22  Yes Frederica Kuster, MD                                                                                                                                    Past Surgical History Past Surgical History:  Procedure Laterality Date   APPENDECTOMY  1936   CERVICAL FUSION  2004   COLON SURGERY  06/12/2010   Colonoscopy   EYE SURGERY Left 2024   FINGER SURGERY  1989   MELANOMA EXCISION  11/16/2017   forehead   SPINE SURGERY  2009   Back Fusion   TONSILLECTOMY  1941   Family History History reviewed. No pertinent family history.  Social History Social History   Tobacco Use   Smoking status: Former   Smokeless tobacco: Never   Tobacco comments:    Quit at age 50   Vaping Use   Vaping Use: Never used  Substance Use Topics   Alcohol use: Not Currently    Comment: Occasionally    Drug use: No   Allergies Penicillins  Review of Systems Review of Systems  Constitutional:  Negative for activity change and fever.  HENT:  Negative for facial swelling and trouble swallowing.   Eyes:  Negative for discharge and redness.  Respiratory:  Negative for cough and shortness of breath.   Cardiovascular:  Negative for chest pain and palpitations.  Gastrointestinal:  Negative for abdominal pain and nausea.  Genitourinary:  Negative for dysuria and flank pain.  Musculoskeletal:  Positive for arthralgias and back pain. Negative for gait problem.  Skin:  Negative for pallor and rash.  Neurological:  Positive for headaches. Negative for dizziness and syncope.    Physical Exam Vital Signs  I have reviewed the triage vital signs BP (!) 97/57   Pulse  (!) 55   Temp 98.2 F (36.8 C) (Oral)   Resp 16   Ht 5' (1.524 m)   Wt 44.2 kg   SpO2 98%   BMI 19.02 kg/m  Physical Exam Vitals and nursing note reviewed.  Constitutional:      General: She is  not in acute distress.    Appearance: Normal appearance. She is not ill-appearing.  HENT:     Head: Normocephalic and atraumatic.     Right Ear: External ear normal.     Left Ear: External ear normal.     Nose: Nose normal.     Mouth/Throat:     Mouth: Mucous membranes are moist.  Eyes:     General: No scleral icterus.       Right eye: No discharge.        Left eye: No discharge.     Extraocular Movements: Extraocular movements intact.     Pupils: Pupils are equal, round, and reactive to light.  Cardiovascular:     Rate and Rhythm: Normal rate and regular rhythm.     Pulses: Normal pulses.     Heart sounds: Normal heart sounds.  Pulmonary:     Effort: Pulmonary effort is normal. No respiratory distress.     Breath sounds: Normal breath sounds.  Abdominal:     General: Abdomen is flat.     Tenderness: There is no abdominal tenderness.  Musculoskeletal:        General: Normal range of motion.       Arms:     Cervical back: No rigidity.     Right lower leg: No edema.     Left lower leg: No edema.     Comments: She has generalized discomfort to essentially her entire thoracic spine and portion of her lumbar spine.  No step-off or crepitance.  Skin:    General: Skin is warm and dry.     Capillary Refill: Capillary refill takes less than 2 seconds.  Neurological:     Mental Status: She is alert and oriented to person, place, and time.     GCS: GCS eye subscore is 4. GCS verbal subscore is 5. GCS motor subscore is 6.     Cranial Nerves: Cranial nerves 2-12 are intact. No facial asymmetry.     Sensory: Sensation is intact. No sensory deficit.     Motor: Motor function is intact. No tremor or pronator drift.     Coordination: Coordination is intact. Finger-Nose-Finger Test normal.      Comments: Gait testing deferred secondary to patient safety  Psychiatric:        Mood and Affect: Mood normal.        Behavior: Behavior normal.     ED Results and Treatments Labs (all labs ordered are listed, but only abnormal results are displayed) Labs Reviewed  COMPREHENSIVE METABOLIC PANEL - Abnormal; Notable for the following components:      Result Value   BUN 26 (*)    All other components within normal limits  CBC                                                                                                                          Radiology No results found.  Pertinent labs & imaging results that were  available during my care of the patient were reviewed by me and considered in my medical decision making (see MDM for details).  Medications Ordered in ED Medications  acetaminophen (TYLENOL) tablet 650 mg (650 mg Oral Given 01/22/23 2052)                                                                                                                                     Procedures Procedures  (including critical care time)  Medical Decision Making / ED Course    Medical Decision Making:    [name] is a 89 y.o. female with past medical history as below, significant for arthritis, depression, GERD, HLD, walker dependence who presents to the ED with complaint of fall.. The complaint involves an extensive differential diagnosis and also carries with it a high risk of complications and morbidity.  Serious etiology was considered. Ddx includes but is not limited to: Differential diagnoses for head trauma includes subdural hematoma, epidural hematoma, acute concussion, traumatic subarachnoid hemorrhage, cerebral contusions, etc. Differential diagnosis includes but is not exclusive to musculoskeletal back pain, renal colic, urinary tract infection, pyelonephritis, intra-abdominal causes of back pain, aortic aneurysm or dissection, cauda equina syndrome, sciatica, lumbar  disc disease, thoracic disc disease, etc.   Complete initial physical exam performed, notably the patient  was no acute distress, resting comfortably, neuroexam is nonfocal.    Reviewed and confirmed nursing documentation for past medical history, family history, social history.  Vital signs reviewed.   No thinners    Labs reviewed, these are stable  CT imaging reviewed and is stable. Mild edema noted to right gluteal area on CT, compartment is soft.  Patient is feeling much better on recheck.  Neuroexam is nonfocal.  Stable for discharge with close outpatient follow-up.  Recommend supportive care at home. HDS at time of discharge. Pending transport home.   The patient improved significantly and was discharged in stable condition. Detailed discussions were had with the patient regarding current findings, and need for close f/u with PCP or on call doctor. The patient has been instructed to return immediately if the symptoms worsen in any way for re-evaluation. Patient verbalized understanding and is in agreement with current care plan. All questions answered prior to discharge.        Additional history obtained: -Additional history obtained from na -External records from outside source obtained and reviewed including: Chart review including previous notes, labs, imaging, consultation notes including home medications, prior ED visits, prior labs and imaging. She was seen 4/6 secondary to fall   Lab Tests: -I ordered, reviewed, and interpreted labs.   The pertinent results include:   Labs Reviewed  COMPREHENSIVE METABOLIC PANEL - Abnormal; Notable for the following components:      Result Value   BUN 26 (*)    All other components within normal limits  CBC    Notable for BUN mildly elevated  EKG  EKG Interpretation  Date/Time:  Thursday January 22 2023 19:13:39 EDT Ventricular Rate:  69 PR Interval:  208 QRS Duration: 93 QT Interval:  389 QTC Calculation: 417 R  Axis:   -12 Text Interpretation: Sinus rhythm Anterior infarct, old similar to prior, no stemi Confirmed by Tanda Rockers ([id number]) on 01/22/2023 10:43:10 PM         Imaging Studies ordered: I ordered imaging studies including CT head and spine and x-ray of hip and shoulder I independently visualized the following imaging with scope of interpretation limited to determining acute life threatening conditions related to emergency care; findings noted above, significant for chronic changes, no fracture I independently visualized and interpreted imaging. I agree with the radiologist interpretation   Medicines ordered and prescription drug management: Meds ordered this encounter  Medications   acetaminophen (TYLENOL) tablet 650 mg   acetaminophen (TYLENOL) 325 MG tablet    Sig: Take 2 tablets (650 mg total) by mouth every 6 (six) hours as needed.    Dispense:  36 tablet    Refill:  0   methocarbamol (ROBAXIN) 500 MG tablet    Sig: Take 2 tablets (1,000 mg total) by mouth 2 (two) times daily for 5 days.    Dispense:  20 tablet    Refill:  0    -I have reviewed the patients home medicines and have made adjustments as needed   Consultations Obtained: na   Cardiac Monitoring: The patient was maintained on a cardiac monitor.  I personally viewed and interpreted the cardiac monitored which showed an underlying rhythm of: NSR  Social Determinants of Health:  Diagnosis or treatment significantly limited by social determinants of health: former smoker   Reevaluation: After the interventions noted above, I reevaluated the patient and found that they have improved  Co morbidities that complicate the patient evaluation  Past Medical History:  Diagnosis Date   Arthritis    Osteoarthritis   Depression    GERD (gastroesophageal reflux disease)    Hyperlipidemia    Osteopenia       Dispostion: Disposition decision including need for hospitalization was considered, and patient discharged  from emergency department.    Final Clinical Impression(s) / ED Diagnoses Final diagnoses:  Fall, initial encounter  Acute thoracic back pain, unspecified back pain laterality     This chart was dictated using voice recognition software.  Despite best efforts to proofread,  errors can occur which can change the documentation meaning.    Sloan Leiter, DO 01/23/23 2349

## 2023-01-22 NOTE — Progress Notes (Unsigned)
   01/22/2023  Patient ID: [name], female   DOB: [birth year], 89 y.o.   MRN: [id number]  Contacted patient to follow-up on transfer of medications to [facility] pharmacy for adherence packaging and mail order.  Reviewed active medication list with patient to verify pharmacy had all current medications.  Of note, patient is now taking 1 tablet of sertraline 100mg  daily versus 1.5 tablets.  [name] was able to transfer all prescriptions except for lisinopril and sertraline, which need refills sent to pharmacy.  Pending orders for these medications for Dr. Hyacinth Meeker to sign if in agreement.  Will follow-up with pharmacy to make sure medications will be filled when due; patient currently has some supply on hand.  [name], PharmD, DPLA

## 2023-01-22 NOTE — ED Triage Notes (Signed)
Patient BIB EMS. Patient reports she was in the bathroom and got dizzy and fell. Patient landed on her right side. Unsure if she hit her head. Denies LOC and does not take blood thinners.   C/o neck, back, right shoulder and hip pain.

## 2023-01-23 ENCOUNTER — Encounter (HOSPITAL_COMMUNITY): Payer: Self-pay | Admitting: Emergency Medicine

## 2023-01-23 ENCOUNTER — Encounter: Payer: Medicare Other | Admitting: Nurse Practitioner

## 2023-01-23 DIAGNOSIS — G8929 Other chronic pain: Secondary | ICD-10-CM | POA: Diagnosis not present

## 2023-01-23 DIAGNOSIS — Z7401 Bed confinement status: Secondary | ICD-10-CM | POA: Diagnosis not present

## 2023-01-23 MED ORDER — ACETAMINOPHEN 325 MG PO TABS: 650.0000 mg | ORAL_TABLET | Freq: Four times a day (QID) | ORAL | 0 refills | Status: DC | PRN

## 2023-01-23 MED ORDER — METHOCARBAMOL 500 MG PO TABS: 1000.0000 mg | ORAL_TABLET | Freq: Two times a day (BID) | ORAL | 0 refills | Status: AC

## 2023-01-23 NOTE — Discharge Instructions (Signed)
It was a pleasure caring for you today in the emergency department. ° °Please return to the emergency department for any worsening or worrisome symptoms. ° ° °

## 2023-01-23 NOTE — ED Notes (Signed)
Patient given crackers and juice.

## 2023-01-23 NOTE — Patient Outreach (Signed)
  Care Coordination   Follow Up Visit Note   01/22/2023 Name: [name] MRN: [id number] DOB: 1927/03/28  [name] is a 89 y.o. year old female who sees Frederica Kuster, MD for primary care. I spoke with  [name] son, [name], by phone today.  What matters to the patients health and wellness today?  Symptom Management and Caregiver Fatigue    Goals Addressed             This Visit's Progress    LCSW-Obtain Supportive Resources   On track    Activities and task to complete in order to accomplish goals.   Keep all upcoming appointments discussed today Continue with compliance of taking medication prescribed by Doctor Implement healthy coping skills discussed to assist with management of symptoms Continue working with [facility] care team to assist with goals identified          SDOH assessments and interventions completed:  No     Care Coordination Interventions:  Yes, provided  Interventions Today    Flowsheet Row Most Recent Value  Chronic Disease   Chronic disease during today's visit Hypertension (HTN), Other  [Anxiety and Depression]  General Interventions   General Interventions Discussed/Reviewed General Interventions Reviewed, Doctor Visits, Level of Care  [LCSW introduced self to pt's son and discussed services through care coordination. Discussed Levels of Care and options for aid assistance]  Doctor Visits Discussed/Reviewed Doctor Visits Discussed  Level of Care Personal Care Services, Skilled Nursing Facility, Assisted Living  Mental Health Interventions   Mental Health Discussed/Reviewed Mental Health Discussed, Coping Strategies, Anxiety, Depression  [Validation and encouragement provided. Caregiver Fatigue]       Follow up plan: Follow up call scheduled for 2-4 weeks    Encounter Outcome:  Pt. Visit Completed   [name], MSW, LCSW [facility] Care Management [facility]  [facility] [email or web address] Phone [phone] 5:47 AM

## 2023-01-23 NOTE — Patient Instructions (Signed)
Visit Information  Thank you for taking time to visit with me today. Please don't hesitate to contact me if I can be of assistance to you.   Following are the goals we discussed today:   Goals Addressed             This Visit's Progress    LCSW-Obtain Supportive Resources   On track    Activities and task to complete in order to accomplish goals.   Keep all upcoming appointments discussed today Continue with compliance of taking medication prescribed by Doctor Implement healthy coping skills discussed to assist with management of symptoms Continue working with [facility] care team to assist with goals identified          Please call the care guide team at [phone] if you need to cancel or reschedule your appointment.   If you are experiencing a Mental Health or Behavioral Health Crisis or need someone to talk to, please call the Suicide and Crisis Lifeline: 988 call 911   Patient verbalizes understanding of instructions and care plan provided today and agrees to view in MyChart. Active MyChart status and patient understanding of how to access instructions and care plan via MyChart confirmed with patient.     [name], MSW, LCSW [facility] Care Management [place]  [facility] [email or web address] Phone [phone] 5:48 AM

## 2023-01-23 NOTE — Progress Notes (Signed)
Err

## 2023-01-26 ENCOUNTER — Other Ambulatory Visit: Payer: Self-pay

## 2023-01-26 ENCOUNTER — Other Ambulatory Visit (HOSPITAL_COMMUNITY): Payer: Self-pay

## 2023-01-27 ENCOUNTER — Other Ambulatory Visit (HOSPITAL_COMMUNITY): Payer: Self-pay

## 2023-01-27 ENCOUNTER — Other Ambulatory Visit: Payer: Self-pay

## 2023-01-27 MED ORDER — LISINOPRIL 20 MG PO TABS
20.0000 mg | ORAL_TABLET | Freq: Every day | ORAL | 3 refills | Status: DC
Start: 2023-01-27 — End: 2023-04-29
  Filled 2023-01-27: qty 90, 90d supply, fill #0
  Filled 2023-01-28: qty 25, 25d supply, fill #0
  Filled 2023-03-03 – 2023-04-02 (×2): qty 30, 30d supply, fill #1
  Filled 2023-04-29: qty 30, 30d supply, fill #2

## 2023-01-27 MED ORDER — SERTRALINE HCL 100 MG PO TABS
100.0000 mg | ORAL_TABLET | Freq: Every day | ORAL | 3 refills | Status: AC
  Filled 2023-01-27 – 2023-01-28 (×2): qty 90, 90d supply, fill #0
  Filled 2023-03-03 (×2): qty 30, 30d supply, fill #0
  Filled 2023-04-02: qty 30, 30d supply, fill #1
  Filled 2023-04-29: qty 30, 30d supply, fill #2
  Filled 2023-05-26: qty 30, 30d supply, fill #3
  Filled 2023-06-24: qty 30, 30d supply, fill #4
  Filled 2023-07-22 – 2023-07-30 (×5): qty 30, 30d supply, fill #5
  Filled 2023-08-25: qty 30, 30d supply, fill #6

## 2023-01-27 NOTE — Telephone Encounter (Signed)
Rx refilled per request.

## 2023-01-28 ENCOUNTER — Telehealth: Payer: Self-pay

## 2023-01-28 ENCOUNTER — Other Ambulatory Visit: Payer: Self-pay

## 2023-01-28 NOTE — Telephone Encounter (Signed)
Transition Care Management Unsuccessful Follow-up Telephone Call  Date of discharge and from where:  01/23/2023 [facility]  Attempts:  1st Attempt  Reason for unsuccessful TCM follow-up call:  Left voice message  Matthe Sloane [facility]  [facility] Population Health Community Resource Care Guide   [email or web address]  ?? [phone]   Website: [email or web address]  [email or web address]

## 2023-01-29 ENCOUNTER — Telehealth: Payer: Self-pay

## 2023-01-29 NOTE — Telephone Encounter (Signed)
Transition Care Management Unsuccessful Follow-up Telephone Call  Date of discharge and from where:  01/23/2023 [facility]  Attempts:  2nd Attempt  Reason for unsuccessful TCM follow-up call:  Left voice message  Camillo Quadros [facility]  [facility] Population Health Community Resource Care Guide   [email or web address]  ?? [phone]   Website: [email or web address]  [email or web address]

## 2023-02-06 ENCOUNTER — Telehealth: Payer: Self-pay | Admitting: Licensed Clinical Social Worker

## 2023-02-09 NOTE — Patient Instructions (Signed)
Visit Information  Thank you for taking time to visit with me today. Please don't hesitate to contact me if I can be of assistance to you.   Following are the goals we discussed today:   Goals Addressed             This Visit's Progress    LCSW-Obtain Supportive Resources   On track    Activities and task to complete in order to accomplish goals.   Keep all upcoming appointments discussed today Continue with compliance of taking medication prescribed by Doctor Implement healthy coping skills discussed to assist with management of symptoms Continue working with [facility] care team to assist with goals identified          Our next appointment is by telephone on 02/20/23 at 11 AM  Please call the care guide team at [phone] if you need to cancel or reschedule your appointment.   If you are experiencing a Mental Health or Behavioral Health Crisis or need someone to talk to, please call the Suicide and Crisis Lifeline: 988 call 911   Patient verbalizes understanding of instructions and care plan provided today and agrees to view in MyChart. Active MyChart status and patient understanding of how to access instructions and care plan via MyChart confirmed with patient.     [name], MSW, LCSW [facility] Care Management [place]  [facility] [email or web address] Phone [phone] 5:23 PM

## 2023-02-09 NOTE — Patient Outreach (Signed)
  Care Coordination   Follow Up Visit Note   02/06/2023 Name: [name] MRN: [id number] DOB: 1926-10-11  [name] is a 89 y.o. year old female who sees Frederica Kuster, MD for primary care. I spoke with  [name] by phone today.  What matters to the patients health and wellness today?  Symptom Management    Goals Addressed             This Visit's Progress    LCSW-Obtain Supportive Resources   On track    Activities and task to complete in order to accomplish goals.   Keep all upcoming appointments discussed today Continue with compliance of taking medication prescribed by Doctor Implement healthy coping skills discussed to assist with management of symptoms Continue working with [facility] care team to assist with goals identified          SDOH assessments and interventions completed:  No     Care Coordination Interventions:  Yes, provided  Interventions Today    Flowsheet Row Most Recent Value  Chronic Disease   Chronic disease during today's visit Hypertension (HTN), Diabetes, Other  [MDD]  General Interventions   General Interventions Discussed/Reviewed General Interventions Reviewed, Doctor Visits  Doctor Visits Discussed/Reviewed Doctor Visits Reviewed  [LCSW reviewed recent ED visit and upcoming appts]  Mental Health Interventions   Mental Health Discussed/Reviewed Mental Health Reviewed, Coping Strategies, Depression  Safety Interventions   Safety Discussed/Reviewed Safety Reviewed, Fall Risk  [Patient is utilizing walker when ambulating. LCSW discussed strategies to promote safety and decrease fall risk]       Follow up plan: Follow up call scheduled for 2-4 weeks    Encounter Outcome:  Pt. Visit Completed   [name], MSW, LCSW [facility] Care Management [facility]  [facility] [email or web address] Phone [phone] 5:21 PM

## 2023-02-12 ENCOUNTER — Telehealth: Payer: Self-pay

## 2023-02-12 ENCOUNTER — Telehealth: Payer: Self-pay | Admitting: Licensed Clinical Social Worker

## 2023-02-12 NOTE — Telephone Encounter (Signed)
[name] from [facility] called to speak with [facility] about patient starting to get pill pack for medications, increase Zoloft and a therapy referral. She was given [place] number to contact.  [name] was advised that patient is already taking a high dose of Zoloft and a message will have to be send to Dr.Miller to see if an increase is possible. She reports that patient have made comments about not wanting to be here anymore and seems a little more depressed than usual. Also a message would have to be send for a PT referral.

## 2023-02-13 ENCOUNTER — Telehealth: Payer: Self-pay

## 2023-02-13 ENCOUNTER — Encounter: Payer: Self-pay | Admitting: Licensed Clinical Social Worker

## 2023-02-13 DIAGNOSIS — I10 Essential (primary) hypertension: Secondary | ICD-10-CM

## 2023-02-13 MED ORDER — LISINOPRIL 20 MG PO TABS
20.0000 mg | ORAL_TABLET | Freq: Every day | ORAL | 3 refills | Status: DC
Start: 2023-02-13 — End: 2023-03-11

## 2023-02-13 NOTE — Patient Outreach (Signed)
  Care Coordination   Follow Up Visit Note   02/12/2023 Name: [name] MRN: [id number] DOB: Dec 28, 1926  [name] is a 89 y.o. year old female who sees Christine Kuster, MD for primary care. I  engaged with community SW, [name]  What matters to the patients health and wellness today?  Patient was not engaged during this encounter    Goals Addressed             This Visit's Progress    LCSW-Obtain Supportive Resources   On track    Activities and task to complete in order to accomplish goals.   Keep all upcoming appointments discussed today Continue with compliance of taking medication prescribed by Doctor Implement healthy coping skills discussed to assist with management of symptoms Continue working with [facility] care team to assist with goals identified          SDOH assessments and interventions completed:  No     Care Coordination Interventions:  Yes, provided  Interventions Today    Flowsheet Row Most Recent Value  Chronic Disease   Chronic disease during today's visit Hypertension (HTN), Diabetes, Other  [MDD and GAD]  General Interventions   General Interventions Discussed/Reviewed Communication with  Communication with --  [LCSW collaborated with pt's social work Nurse, adult at [employer]. Discussed patient barriers and needs]  Mental Health Interventions   Mental Health Discussed/Reviewed Mental Health Discussed, Depression, Anxiety  [Pt receives limited support]  Nutrition Interventions   Nutrition Discussed/Reviewed Nutrition Reviewed  Pharmacy Interventions   Pharmacy Dicussed/Reviewed Pharmacy Topics Discussed  [[name] has contacted PCP office to discuss medication concerns. States pt is in need of pill packs due to dropping meds on floor or inability to open bottles]  Safety Interventions   Safety Discussed/Reviewed Safety Discussed, Fall Risk       Follow up plan: Follow up call scheduled for within 1 week    Encounter  Outcome:  Pt. Visit Completed   [name], MSW, LCSW [facility] Care Management [facility]  [facility] [email or web address] Phone [phone] 6:55 AM

## 2023-02-13 NOTE — Telephone Encounter (Signed)
Patient call this morning because she was out of her high blood pressure medication Lisinopril and need a refill to be sent to the pharmacy.

## 2023-02-13 NOTE — Patient Instructions (Signed)
Visit Information  Thank you for taking time to visit with me today. Please don't hesitate to contact me if I can be of assistance to you.   Following are the goals we discussed today:   Goals Addressed             This Visit's Progress    LCSW-Obtain Supportive Resources   On track    Activities and task to complete in order to accomplish goals.   Keep all upcoming appointments discussed today Continue with compliance of taking medication prescribed by Doctor Implement healthy coping skills discussed to assist with management of symptoms Continue working with [facility] care team to assist with goals identified          Please call the care guide team at [phone] if you need to cancel or reschedule your appointment.   If you are experiencing a Mental Health or Behavioral Health Crisis or need someone to talk to, please call the Suicide and Crisis Lifeline: 988 call 911   Patient verbalizes understanding of instructions and care plan provided today and agrees to view in MyChart. Active MyChart status and patient understanding of how to access instructions and care plan via MyChart confirmed with patient.     [name], MSW, LCSW [facility] Care Management [place]  [facility] [email or web address] Phone [phone] 6:56 AM

## 2023-02-13 NOTE — Telephone Encounter (Signed)
error 

## 2023-02-16 ENCOUNTER — Encounter: Payer: Self-pay | Admitting: Licensed Clinical Social Worker

## 2023-02-16 NOTE — Patient Outreach (Signed)
Error

## 2023-02-17 ENCOUNTER — Encounter: Payer: Self-pay | Admitting: Licensed Clinical Social Worker

## 2023-02-17 NOTE — Patient Outreach (Signed)
  Care Coordination   Initial Visit Note   02/16/2023 Name: [name] MRN: [id number] DOB: 1926-12-02  [name] is a 89 y.o. year old female who sees Christine Kuster, MD for primary care. I  engaged with Care Coordination Assistant  What matters to the patients health and wellness today?  Pt was not engaged during this encounter    SDOH assessments and interventions completed:  No     Care Coordination Interventions:  Yes, provided  Interventions Today    Flowsheet Row Most Recent Value  General Interventions   General Interventions Discussed/Reviewed Communication with  Communication with --  [LCSW collaborated with Care Assistant regarding pt's facility coordinator needing a call to discuss patient care/barriers. LCSW made multiple calls and left voice messages for a return call]       Follow up plan:  LCSW will contact on 02/20/23    Encounter Outcome:  Pt. Visit Completed   [name], MSW, LCSW [facility] Care Management [facility]  [facility] [name] Phone [phone] 6:16 PM

## 2023-02-18 ENCOUNTER — Encounter: Payer: Self-pay | Admitting: Licensed Clinical Social Worker

## 2023-02-19 NOTE — Patient Outreach (Signed)
  Care Coordination   Multidisciplinary Case Review Note    02/18/2023 Name: [name] MRN: [id number] DOB: [birth year]  [name] is a 89 y.o. year old female who sees Christine Kuster, MD for primary care.  The  multidisciplinary care team met today to review patient care needs and barriers.    Care Coordination Interventions: Multidisciplinary case discussion to review patient ongoing care coordination needs  Patient to be engaged ongoing by LCSW [name] to address disease management and social support needs   SDOH assessments and interventions completed:  No     Care Coordination Interventions Activated:  Yes   Care Coordination Interventions:  Yes, provided   Follow up plan: Follow up call scheduled for 7/5    Multidisciplinary Team Attendees:   [name], [address] RNCM Christine Lucks, LCSW [name], [place] RN  Scribe for Multidisciplinary Case Review:   [name], MSW, LCSW [facility] Care Management [facility]  [facility] [name] Phone [phone] 12:30 AM

## 2023-02-19 NOTE — Patient Outreach (Signed)
  Care Coordination   Follow Up Visit Note   02/17/2023 Name: [name] MRN: [id number] DOB: 1926-09-19  [name] is a 89 y.o. year old female who sees Christine Kuster, MD for primary care. I  engaged with [name], coordinator at [facility]  What matters to the patients health and wellness today?  Patient was not engaged during this encounter    Goals Addressed             This Visit's Progress    LCSW-Obtain Supportive Resources   On track    Activities and task to complete in order to accomplish goals.   Keep all upcoming appointments discussed today Continue with compliance of taking medication prescribed by Doctor Implement healthy coping skills discussed to assist with management of symptoms Continue working with [facility] care team to assist with goals identified          SDOH assessments and interventions completed:  No     Care Coordination Interventions:  Yes, provided  Interventions Today    Flowsheet Row Most Recent Value  Chronic Disease   Chronic disease during today's visit Hypertension (HTN), Diabetes, Other  [Depression]  General Interventions   General Interventions Discussed/Reviewed General Interventions Reviewed, Doctor Visits  [Pt continues to endorse difficulty managing depression symptoms to housing staff. Memory lapses has resulted in patient having limited options to pay rent, noting transportation barriers. LCSW discussed community resources, will f/up with palliative care]  Doctor Visits Discussed/Reviewed Doctor Visits Reviewed  Exercise Interventions   Exercise Discussed/Reviewed Exercise Reviewed  [Pt is requesting [facility] services]  Mental Health Interventions   Mental Health Discussed/Reviewed Mental Health Reviewed, Depression  Pharmacy Interventions   Pharmacy Dicussed/Reviewed Pharmacy Topics Reviewed, Medication Adherence  [Housing has concerns about pt's ability to open pill bottles. She drops pills resulting in risk of decrease in med  compliance]  Safety Interventions   Safety Discussed/Reviewed Safety Reviewed, Fall Risk       Follow up plan: Follow up call scheduled for 1 week    Encounter Outcome:  Pt. Visit Completed   [name], MSW, LCSW [facility] Care Management [facility]  [facility] [name] Phone [phone] 12:13 AM

## 2023-02-20 ENCOUNTER — Encounter: Payer: Self-pay | Admitting: Licensed Clinical Social Worker

## 2023-02-23 ENCOUNTER — Other Ambulatory Visit: Payer: Self-pay | Admitting: Family Medicine

## 2023-02-23 NOTE — Progress Notes (Signed)
.  Records show she is taking 150 mg already. Coul add Abilify to amplify anti-depessant effect of sertraline is interested

## 2023-02-26 ENCOUNTER — Ambulatory Visit: Payer: Self-pay

## 2023-02-26 NOTE — Patient Outreach (Signed)
  Care Coordination   02/26/2023 Name: [name] MRN: [id number] DOB: July 07, 1927   Care Coordination Outreach Attempts:  An unsuccessful telephone outreach was attempted for a scheduled appointment today.  Follow Up Plan:  Additional outreach attempts will be made to offer the patient care coordination information and services.   Encounter Outcome:  No Answer   Care Coordination Interventions:  No, not indicated    Delsa Sale, RN, BSN, CCM Care Management Coordinator [facility] Care Management  Direct Phone: [phone]

## 2023-02-27 NOTE — Patient Instructions (Signed)
Visit Information  Thank you for taking time to visit with me today. Please don't hesitate to contact me if I can be of assistance to you.   Following are the goals we discussed today:   Goals Addressed             This Visit's Progress    To avoid having further falls       Care Coordination Interventions: Provided written and verbal education re: potential causes of falls and Fall prevention strategies Reviewed medications and discussed potential side effects of medications such as dizziness and frequent urination Advised patient of importance of notifying provider of falls Assessed for signs and symptoms of orthostatic hypotension Assessed working status of life alert bracelet and patient adherence Assessed social determinant of health barriers        To get help with worsening depression       Care Coordination Interventions: Evaluation of current treatment plan related to depression and patient's adherence to plan as established by provider Determined and discussed with patient her concerns about worsening depression, determined patient feels this has occurred due to having some personal stressors in her life Reviewed and discussed with patient her current medication regimen to treat  her depression, she reports 100% adherence to taking her mediations as prescribed with no missed doses, however [name] her service coordinator acknowledged she is having difficulty with polypharmacy issues Reviewed and discussed upcoming telephone visit with pharm D to assist with pill packaging  Reviewed and discussed note per PCP Dr. Hyacinth Meeker with patient regarding his consideration to add Abilify to her current regimen and patient would like to proceed Educated patient about this mediation including the indication, dosage, frequency and potential SE that may occur Discussed this RN will communicate  her concerns and request for f/u per PCP to advise of pharmacological recommendations Discussed with  patient she will continue to work with LCSW [name] for ongoing support and counseling Reviewed with patient her next scheduled follow up with [facility] and patient is aware of the time/date Sent the following high priority in basket message to PCP providers for this patient, included pharm D and LCSW Hello,  I am one of the [facility] nurse care coordinator's working with this patient. I spoke with the patient yesterday as well as the service coordinator, [name] at [facility], the retirement community for which Ms. Tremper lives.   The patient is reporting worsening depression. She believes this has been triggered by increased stress related to some personal situations that have occurred. The service coordinator stated the patient recently said to her, "I might as well just die". The patient denies having suicidal ideation at this time and is working with our LCSW [name] for counseling. She is currently taking Sertraline 100 mg daily at bedtime (has taken 150 mg in the past). She denies missing any medications. Dr. Hyacinth Meeker made note he may consider adding Abilify but I do not see where this has been completed. Please keep in mind this patient is at risk for falls. She has requested that I follow up with Dr. Hyacinth Meeker to determine what his recommendations are concerning the Sertraline dosing and or adding Abilify?  Monina, you have seen this patient once in the past and Elnita Maxwell you have an upcoming telephone visit so I am including the two of you in this message. I know Dr. Hyacinth Meeker is only in the office one day a week.   Please let me know if you can further assist  with this patient's request and or have any other recommendations.   Thank you!  Delsa Sale, RN, BSN, CCM Care Management Coordinator [facility] Care Management Direct Phone: [phone] Received a reply from Kenard Gower NP requesting patient schedule an office visit for further evaluation of her symptoms related to  worsening depression Placed unsuccessful call to patient and service coordinator [name], left a HIPAA compliant message requesting a return phone call        Our next appointment is by telephone on 03/02/23 at 09:00 AM  Please call the care guide team at [phone] if you need to cancel or reschedule your appointment.   If you are experiencing a Mental Health or Behavioral Health Crisis or need someone to talk to, please call 1-800-273-TALK (toll free, 24 hour hotline)  Patient verbalizes understanding of instructions and care plan provided today and agrees to view in MyChart. Active MyChart status and patient understanding of how to access instructions and care plan via MyChart confirmed with patient.     Delsa Sale, RN, BSN, CCM Care Management Coordinator [facility] Care Management Direct Phone: [phone]

## 2023-02-27 NOTE — Patient Outreach (Signed)
Care Coordination   Initial Visit Note   02/26/2023 Name: [name] MRN: [id number] DOB: 07/03/1927  [name] is a 89 y.o. year old female who sees Christine Kuster, MD for primary care. I spoke with [name] and residential service coordinator for [name], [name] on joint Carter with patient's verbal consent by phone today.  What matters to the patients health and wellness today?  Patient would like to have her worsening depression evaluated and treated.     Goals Addressed             This Visit's Progress    To avoid having further falls       Care Coordination Interventions: Provided written and verbal education re: potential causes of falls and Fall prevention strategies Reviewed medications and discussed potential side effects of medications such as dizziness and frequent urination Advised patient of importance of notifying provider of falls Assessed for signs and symptoms of orthostatic hypotension Assessed working status of life alert bracelet and patient adherence Assessed social determinant of health barriers       To get help with worsening depression       Care Coordination Interventions: Evaluation of current treatment plan related to depression and patient's adherence to plan as established by provider Determined and discussed with patient her concerns about worsening depression, determined patient feels this has occurred due to having some personal stressors in her life Reviewed and discussed with patient her current medication regimen to treat  her depression, she reports 100% adherence to taking her mediations as prescribed with no missed doses, however [name] her service coordinator acknowledged she is having difficulty with polypharmacy issues Reviewed and discussed upcoming telephone visit with pharm D to assist with pill packaging  Reviewed and discussed note per PCP [name] with patient regarding his consideration to add Abilify to her  current regimen and patient would like to proceed Educated patient about this mediation including the indication, dosage, frequency and potential SE that may occur Discussed this RN will communicate  her concerns and request for f/u per PCP to advise of pharmacological recommendations Discussed with patient she will continue to work with [name] for ongoing support and counseling Reviewed with patient her next scheduled follow up with [name] and patient is aware of the time/date Sent the following high priority in basket message to PCP providers for this patient, included pharm D and Christine Hello,  I am one of the [name] nurse care coordinator's working with this patient. I spoke with the patient yesterday as well as the service coordinator, [name] at [facility], the retirement community for which [name] lives.   The patient is reporting worsening depression. She believes this has been triggered by increased stress related to some personal situations that have occurred. The service coordinator stated the patient recently said to her, "I might as well just die". The patient denies having suicidal ideation at this time and is working with our [name] for counseling. She is currently taking Sertraline 100 mg daily at bedtime (has taken 150 mg in the past). She denies missing any medications. [name] made note he may consider adding Abilify but I do not see where this has been completed. Please keep in mind this patient is at risk for falls. She has requested that I follow up with [name] to determine what his recommendations are concerning the Sertraline dosing and or adding Abilify?  [name], you have  seen this patient once in the past and [name] you have an upcoming telephone visit so I am including the two of you in this message. I know [name] is only in the office one day a week.   Please let me know if you can further assist with this patient's request  and or have any other recommendations.   Thank you!  Christine Sale, RN, BSN, CCM Care Carter Coordinator [name] Direct Phone: [phone] Received a reply from Christine Gower NP requesting patient schedule an office visit for further evaluation of her symptoms related to worsening depression Placed unsuccessful Carter to patient and service coordinator [name]      Interventions Today    Flowsheet Row Most Recent Value  Chronic Disease   Chronic disease during today's visit Other  [depression,  falls]  General Interventions   General Interventions Discussed/Reviewed General Interventions Discussed, General Interventions Reviewed, Doctor Visits, Level of Care, Communication with  Doctor Visits Discussed/Reviewed Doctor Visits Discussed, Doctor Visits Reviewed, PCP, Specialist  Communication with PCP/Specialists  [name] provider, pharmacist, Christine]  Exercise Interventions   Exercise Discussed/Reviewed Physical Activity, Assistive device use and maintanence  Physical Activity Discussed/Reviewed Physical Activity Discussed, Physical Activity Reviewed  Education Interventions   Education Provided Provided Education  Provided Verbal Education On Medication, When to see the doctor, Mental Health/Coping with Illness  Mental Health Interventions   Mental Health Discussed/Reviewed Mental Health Discussed, Mental Health Reviewed, Depression, Refer to Social Work for resources  Refer to Social Work for resources regarding Mental Health, Other  [depression]  Pharmacy Interventions   Pharmacy Dicussed/Reviewed Pharmacy Topics Reviewed, Pharmacy Topics Discussed, Medications and their functions  Safety Interventions   Safety Discussed/Reviewed Safety Discussed, Safety Reviewed, Fall Risk, Home Safety  Home Safety Assistive Devices          SDOH assessments and interventions completed:  No     Care  Coordination Interventions:  Yes, provided   Follow up plan: Follow up Carter scheduled for 03/02/23 @09 :00 AM    Encounter Outcome:  Pt. Visit Completed

## 2023-03-02 ENCOUNTER — Ambulatory Visit: Payer: Self-pay

## 2023-03-02 ENCOUNTER — Encounter: Payer: Self-pay | Admitting: Licensed Clinical Social Worker

## 2023-03-02 ENCOUNTER — Telehealth: Payer: Self-pay

## 2023-03-02 NOTE — Patient Outreach (Signed)
  Care Coordination   Follow Up Visit Note   03/02/2023 Name: [name] MRN: [id number] DOB: [birth year]  [name] is a 89 y.o. year old female who sees Frederica Kuster, MD for primary care. I spoke with  [name] by phone today.  What matters to the patients health and wellness today?  Patient will call her PCP to schedule a follow up visit for evaluation/treatment of worsening depression.     Goals Addressed             This Visit's Progress    To avoid having further falls       Care Coordination Interventions: Provided written and verbal education re: potential causes of falls and Fall prevention strategies Reviewed medications and discussed potential side effects of medications such as dizziness and frequent urination Advised patient of importance of notifying provider of falls Assessed for signs and symptoms of orthostatic hypotension Assessed working status of life alert bracelet and patient adherence Assessed social determinant of health barriers        To get help with worsening depression       Care Coordination Interventions: Evaluation of current treatment plan related to depression and patient's adherence to plan as established by provider Spoke with patient regarding nurse collaboration with Clayborne Dana NP working with Dr. Hyacinth Meeker Advised patient this PCP would like for her to call the office in order to schedule a follow up visit for further evaluation and treatment of her depression Determined patient verbalizes understanding and will call the office today to schedule an appointment  Reviewed upcoming scheduled telephone visits with patient including 03/03/23 telephone follow up with [name] LCSW scheduled for 10 am and 03/06/23 telephone visit with Sabino Niemann Pharm D scheduled for 1 PM for help with pill packs, advised of next scheduled RN CC telephone visit scheduled for 03/12/23 @2  PM for a nurse follow up Determined patient recorded all  scheduled visits, she verbalizes understanding of next step to call PCP for an appointment and upcoming scheduled f/u visits     Interventions Today    Flowsheet Row Most Recent Value  Chronic Disease   Chronic disease during today's visit Other  [depression]  General Interventions   General Interventions Discussed/Reviewed General Interventions Discussed, General Interventions Reviewed, Doctor Visits, Communication with  Doctor Visits Discussed/Reviewed Doctor Visits Discussed, Doctor Visits Reviewed, PCP  Communication with Social Work  [name] LCSW]  Education Interventions   Education Provided Provided Education  Provided Verbal Education On Medication, When to see the doctor, Mental Health/Coping with Illness  Mental Health Interventions   Mental Health Discussed/Reviewed Mental Health Discussed, Mental Health Reviewed, Depression  Pharmacy Interventions   Pharmacy Dicussed/Reviewed Pharmacy Topics Discussed, Pharmacy Topics Reviewed, Medications and their functions  Safety Interventions   Safety Discussed/Reviewed Safety Discussed, Safety Reviewed, Fall Risk, Home Safety  Home Safety Assistive Devices          SDOH assessments and interventions completed:  No     Care Coordination Interventions:  Yes, provided   Follow up plan: Follow up call scheduled for 03/12/23 @2 :00 PM     Encounter Outcome:  Pt. Visit Completed

## 2023-03-02 NOTE — Patient Instructions (Signed)
Visit Information  Thank you for taking time to visit with me today. Please don't hesitate to contact me if I can be of assistance to you.   Following are the goals we discussed today:   Goals Addressed             This Visit's Progress    To avoid having further falls       Care Coordination Interventions: Provided written and verbal education re: potential causes of falls and Fall prevention strategies Reviewed medications and discussed potential side effects of medications such as dizziness and frequent urination Advised patient of importance of notifying provider of falls Assessed for signs and symptoms of orthostatic hypotension Assessed working status of life alert bracelet and patient adherence Assessed social determinant of health barriers        To get help with worsening depression       Care Coordination Interventions: Evaluation of current treatment plan related to depression and patient's adherence to plan as established by provider Spoke with patient regarding nurse collaboration with Clayborne Dana NP working with Dr. Hyacinth Meeker Advised patient this PCP would like for her to call the office in order to schedule a follow up visit for further evaluation and treatment of her depression Determined patient verbalizes understanding and will call the office today to schedule an appointment  Reviewed upcoming scheduled telephone visits with patient including 03/03/23 telephone follow up with [name] LCSW scheduled for 10 am and 03/06/23 telephone visit with Sabino Niemann Pharm D scheduled for 1 PM for help with pill packs, advised of next scheduled RN CC telephone visit scheduled for 03/12/23 @2  PM for a nurse follow up Determined patient recorded all scheduled visits, she verbalizes understanding of next step to call PCP for an appointment and upcoming scheduled f/u visits         Our next appointment is by telephone on 03/12/23 at 2:00 PM   Please call the care  guide team at [phone] if you need to cancel or reschedule your appointment.   If you are experiencing a Mental Health or Behavioral Health Crisis or need someone to talk to, please call 1-800-273-TALK (toll free, 24 hour hotline)  Patient verbalizes understanding of instructions and care plan provided today and agrees to view in MyChart. Active MyChart status and patient understanding of how to access instructions and care plan via MyChart confirmed with patient.     Delsa Sale, RN, BSN, CCM Care Management Coordinator [facility] Care Management Direct Phone: [phone]

## 2023-03-03 ENCOUNTER — Ambulatory Visit: Payer: Self-pay | Admitting: Licensed Clinical Social Worker

## 2023-03-03 ENCOUNTER — Encounter: Payer: Self-pay | Admitting: Pharmacist

## 2023-03-03 ENCOUNTER — Other Ambulatory Visit: Payer: Self-pay

## 2023-03-03 NOTE — Patient Outreach (Signed)
  Care Coordination   Follow Up Visit Note   03/02/2023 Name: [name] MRN: [id number] DOB: 1927/01/30  [name] is a 89 y.o. year old female who sees Frederica Kuster, MD for primary care. I  engaged with [facility] Assistant  What matters to the patients health and wellness today?  Patient was not engaged during this encounter     SDOH assessments and interventions completed:  No     Care Coordination Interventions:  Yes, provided  Interventions Today    Flowsheet Row Most Recent Value  General Interventions   General Interventions Discussed/Reviewed Communication with  Communication with --  [LCSW received correspondence that pt has questions about medications and was requesting a call back. [facility] Assistant requested Pharmacist to contact pt.]       Follow up plan: Follow up call scheduled for 7/16    Encounter Outcome:  Pt. Visit Completed   [name], MSW, LCSW [facility] Care Management [facility]  [facility] [email or web address] Phone [phone] 5:13 PM

## 2023-03-03 NOTE — Progress Notes (Signed)
   03/03/2023  Patient ID: [name], female   DOB: 09-Jan-1927, 89 y.o.   MRN: [id number]  S/O Clinic routed request to return call from [name], patient's Service Coordinator at [facility]  Medication Access/Adherence -Patient reporting to service coordinator that she is out of sertraline and omeprazole -PharmD was working with [facility] to establish adherence packaging and delivery of medications.  Lacking active medication orders were sent to pharmacy in June; but orders were never filled/delivered -Upcoming telephone visit with myself and then Dr. Hyacinth Meeker to discuss worsening depression  A/P  Medication Access/Adherence -Contacted [facility], and they are going to fill omeprazole 20mg , gabapentin 300mg , and sertraline 100mg  and deliver to the patient; because these are due to be filled at this time -During our telephone discussion Friday, I will verify active medications to make sure [facility] has all available to refill when needed.  [facility] endorses no longer having active prescriptions, so patient should not receive any duplicated fills. -Will evaluate medications and provide medication suggestions to Dr. Hyacinth Meeker prior to upcoming visit with Ms. Candler.  Due to likelihood of medications changing at this appointment, I recommend holding off on adherence packaging at this time -[name] aware of this information and will discuss with patient this morning  Follow-up:    [name], PharmD, DPLA

## 2023-03-04 ENCOUNTER — Encounter: Payer: Self-pay | Admitting: Licensed Clinical Social Worker

## 2023-03-04 NOTE — Patient Instructions (Signed)
Visit Information  Thank you for taking time to visit with me today. Please don't hesitate to contact me if I can be of assistance to you.   Following are the goals we discussed today:   Goals Addressed             This Visit's Progress    LCSW-Obtain Supportive Resources   On track    Activities and task to complete in order to accomplish goals.   Keep all upcoming appointments discussed today Continue with compliance of taking medication prescribed by Doctor Implement healthy coping skills discussed to assist with management of symptoms Continue working with [facility] care team to assist with goals identified         Please call the care guide team at [phone] if you need to cancel or reschedule your appointment.   If you are experiencing a Mental Health or Behavioral Health Crisis or need someone to talk to, please call the Suicide and Crisis Lifeline: 988 call 911   Patient verbalizes understanding of instructions and care plan provided today and agrees to view in MyChart. Active MyChart status and patient understanding of how to access instructions and care plan via MyChart confirmed with patient.     [name], MSW, LCSW [facility] Care Management [place]  [facility] [email or web address] Phone [phone] 11:55 PM

## 2023-03-04 NOTE — Patient Outreach (Signed)
  Care Coordination   Follow Up Visit Note   03/03/2023 Name: [name] MRN: [id number] DOB: 01/08/1927  [name] is a 89 y.o. year old female who sees Frederica Kuster, MD for primary care. I spoke with  [name] by phone today.  What matters to the patients health and wellness today?  Symptom Management and Community Resources    Goals Addressed             This Visit's Progress    LCSW-Obtain Supportive Resources   On track    Activities and task to complete in order to accomplish goals.   Keep all upcoming appointments discussed today Continue with compliance of taking medication prescribed by Doctor Implement healthy coping skills discussed to assist with management of symptoms Continue working with [facility] care team to assist with goals identified          SDOH assessments and interventions completed:  No     Care Coordination Interventions:  Yes, provided  Interventions Today    Flowsheet Row Most Recent Value  Chronic Disease   Chronic disease during today's visit Hypertension (HTN), Diabetes, Other  [MDD and GAD]  General Interventions   General Interventions Discussed/Reviewed General Interventions Reviewed, [facility], Doctor Visits, Communication with  Doctor Visits Discussed/Reviewed Doctor Visits Reviewed  Communication with Pharmacists  [LCSW collaborated with [name], IT trainer and Pharmacist]  Mental Health Interventions   Mental Health Discussed/Reviewed Mental Health Reviewed, Coping Strategies, Depression, Anxiety, Grief and Loss  Nutrition Interventions   Nutrition Discussed/Reviewed Nutrition Reviewed  Pharmacy Interventions   Pharmacy Dicussed/Reviewed Pharmacy Topics Reviewed, Medication Adherence  [Pharmacist will discuss pill packs after pt completes upcoming pcp appt scheduled for 07/24. Pharmacy appt scheduled for 07/19]  Safety Interventions   Safety Discussed/Reviewed Safety Reviewed, Fall Risk, Home Safety   Home Safety Refer for community resources  [LCSW will collaborate with [employer] and community agencies regarding aid services]       Follow up plan: Follow up call scheduled for 1-2 weeks    Encounter Outcome:  Pt. Visit Completed   [name], MSW, LCSW [facility] Care Management [facility] Health  [facility] [email or web address] Phone [phone] 11:54 PM

## 2023-03-05 ENCOUNTER — Other Ambulatory Visit (HOSPITAL_COMMUNITY): Payer: Self-pay

## 2023-03-05 ENCOUNTER — Telehealth: Payer: Self-pay

## 2023-03-05 ENCOUNTER — Other Ambulatory Visit: Payer: Self-pay

## 2023-03-05 NOTE — Telephone Encounter (Signed)
   03/05/2023  Patient ID: [name], female   DOB: 26-Sep-1926, 89 y.o.   MRN: [id number]  Clinic routed request from LCSW stating that [name] has a full bottle of gabapentin and does not need this refilled at this time.  Upon check on the fill status of her medications at [facility], I discovered a note stating the pharmacy has been trying to contact the patient about her payment method.  I contact [name] with [name] to provide her with the phone number for [facility], so they can get this taken care of.  Advised that they inform the pharmacy that the patient does not need gabapentin refilled when they call.  [name] has my direct number for any future needs.  [name], PharmD, DPLA

## 2023-03-05 NOTE — Patient Outreach (Signed)
  Care Coordination   Multidisciplinary Case Review Note    03/04/2023 Name: [name] MRN: [id number] DOB: [birth year]  [name] is a 89 y.o. year old female who sees Frederica Kuster, MD for primary care.  The  multidisciplinary care team met today to review patient care needs and barriers.    Care Coordination Interventions: Multidisciplinary case discussion to review patient ongoing care coordination needs  Patient to be engaged ongoing by RN Care Manager Angel Little and LCSW [name] to address disease management and social support needs  SDOH assessments and interventions completed:  Yes     Care Coordination Interventions Activated:  Yes   Care Coordination Interventions:  Yes, provided   Follow up plan: Follow up call scheduled for 1-2 weeks    Multidisciplinary Team Attendees:   Delsa Sale, RNCM Roshanda Little, RNCM [name], LCSW  Scribe for Multidisciplinary Case Review:   [name], MSW, LCSW [facility] Care Management [facility]  [facility] [email or web address] Phone [phone] 5:20 PM

## 2023-03-06 ENCOUNTER — Other Ambulatory Visit: Payer: Medicare Other

## 2023-03-09 ENCOUNTER — Other Ambulatory Visit (HOSPITAL_COMMUNITY): Payer: Self-pay

## 2023-03-09 ENCOUNTER — Other Ambulatory Visit: Payer: Medicare Other

## 2023-03-09 NOTE — Progress Notes (Signed)
   03/09/2023  Patient ID: [name], female   DOB: 1927-05-04, 89 y.o.   MRN: [id number]  Patient outreach for scheduled telephone visit unsuccessful; tried to call and left HIPAA compliant voicemail x2 with my direct number for patient to return my call.  Also, tried to contact [name] with [facility] where patient resides in regard to a voicemail she left me this morning stating that [name] needs her brimonidine refilled.  She also mentioned that [facility] keeps contacting patient about a prescription being ready.  Contacted [facility], and there is a prescription on hold for her brimonidine eye drop that they are going to put into process to fill and mail out.  Contacted [facility], the prescription they had ready was actually brimonidine; so I asked that they return to patient's profile.  Will try to reach [name] again tomorrow prior to her upcoming appointment with Dr. Hyacinth Meeker 7/24.  [name], PharmD, DPLA

## 2023-03-11 ENCOUNTER — Ambulatory Visit (INDEPENDENT_AMBULATORY_CARE_PROVIDER_SITE_OTHER): Payer: Medicare Other | Admitting: Family Medicine

## 2023-03-11 ENCOUNTER — Ambulatory Visit: Payer: Self-pay | Admitting: Licensed Clinical Social Worker

## 2023-03-11 ENCOUNTER — Telehealth: Payer: Self-pay

## 2023-03-11 ENCOUNTER — Other Ambulatory Visit: Payer: Self-pay

## 2023-03-11 ENCOUNTER — Encounter: Payer: Self-pay | Admitting: Family Medicine

## 2023-03-11 ENCOUNTER — Other Ambulatory Visit (HOSPITAL_COMMUNITY): Payer: Self-pay

## 2023-03-11 VITALS — BP 100/70 | HR 68 | Temp 97.6°F | Resp 16 | Ht 60.0 in | Wt 99.2 lb

## 2023-03-11 DIAGNOSIS — F331 Major depressive disorder, recurrent, moderate: Secondary | ICD-10-CM | POA: Diagnosis not present

## 2023-03-11 DIAGNOSIS — I10 Essential (primary) hypertension: Secondary | ICD-10-CM | POA: Diagnosis not present

## 2023-03-11 DIAGNOSIS — H409 Unspecified glaucoma: Secondary | ICD-10-CM | POA: Diagnosis not present

## 2023-03-11 MED ORDER — ARIPIPRAZOLE 2 MG PO TABS
2.0000 mg | ORAL_TABLET | Freq: Every day | ORAL | 1 refills | Status: DC
Start: 2023-03-11 — End: 2023-04-25
  Filled 2023-03-11: qty 30, 30d supply, fill #0
  Filled 2023-04-02: qty 30, 30d supply, fill #1

## 2023-03-11 NOTE — Progress Notes (Signed)
   03/11/2023  Patient ID: [name], female   DOB: 1927/01/05, 89 y.o.   MRN: [id number]  Received voicemail from [name] with [facility] yesterday stating Ms. Neal had not received her refill for brimonidine 0.2% ophthalmic solution.  She is also needing to verify which eye drops Ms. Carlisi is to be taking.  Contacted [facility], and when they initially tried to refill the brimonodine 7/22 insurance was rejecting the claim.  Prescription reprocessed today and going through fine; this should be delivered to patient tomorrow.  Attempted to contact [name] at [facility] to inform her of this and discuss Ms. Garrette's currently prescribed eye drops but was not able to reach.  Left a voicemail for her to return my call.  [name], PharmD, DPLA

## 2023-03-11 NOTE — Progress Notes (Signed)
Provider:  Jacalyn Lefevre, MD  Careteam: Patient Care Team: Frederica Kuster, MD as PCP - General (Family Medicine) Sallye Lat, MD as Consulting Physician (Ophthalmology) Mathews Robinsons, MD as Consulting Physician (Dermatology) Bridgett Larsson, LCSW as Social Worker (Licensed Clinical Social Worker)  PLACE OF SERVICE:  [facility]  Advanced Directive information Does Patient Have a Medical Advance Directive?: Yes, Type of Advance Directive: Healthcare Power of [place];Living will;Out of facility DNR (pink MOST or yellow form), Does patient want to make changes to medical advance directive?: No - Patient declined  Allergies  Allergen Reactions   Penicillins Rash    Chief Complaint  Patient presents with   Medical Management of Chronic Issues    Patient is here to discuss depression medication.      HPI: Patient is a 89 y.o. Carter patient presents today to discuss depression.  She has been on sertraline 100 mg for about 3 years now.  Was previously on 150 mg.  Started on medicine when her daughter died.  Reports to sleeping well.  Does not have much of an appetite.  Just feels sad and she had PHQ screen done and scored 7 today. Also has glaucoma and had a stent and right for increased pressures  Review of Systems:  Review of Systems  HENT: Negative.    Respiratory: Negative.    Cardiovascular: Negative.   Musculoskeletal: Negative.   Neurological: Negative.   Psychiatric/Behavioral:  Positive for depression.   All other systems reviewed and are negative.   Past Medical History:  Diagnosis Date   Arthritis    Osteoarthritis   Depression    GERD (gastroesophageal reflux disease)    Hyperlipidemia    Osteopenia    Past Surgical History:  Procedure Laterality Date   APPENDECTOMY  1936   CERVICAL FUSION  2004   COLON SURGERY  06/12/2010   Colonoscopy   EYE SURGERY Left 2024   FINGER SURGERY  1989   MELANOMA EXCISION  11/16/2017   forehead    SPINE SURGERY  2009   Back Fusion   TONSILLECTOMY  1941   Social History:   reports that she has quit smoking. She has never used smokeless tobacco. She reports that she does not currently use alcohol. She reports that she does not use drugs.  History reviewed. No pertinent family history.  Medications: Patient's Medications  New Prescriptions   No medications on file  Previous Medications   ADVIL 200 MG CAPS    Take 200 mg by mouth every 8 (eight) hours as needed (for pain or headaches).   BIOTIN PO    Take 1 capsule by mouth daily.   BRIMONIDINE (ALPHAGAN) 0.2 % OPHTHALMIC SOLUTION    Place 1 drop into the right eye 2 (two) times daily.   DICLOFENAC SODIUM (VOLTAREN) 1 % GEL    Apply 2 g topically 4 (four) times daily.   DORZOLAMIDE-TIMOLOL (COSOPT) 2-0.5 % OPHTHALMIC SOLUTION    Place 1 drop into the right eye 2 (two) times daily.   GABAPENTIN (NEURONTIN) 300 MG CAPSULE    Take 1 capsule (300 mg total) by mouth 2 (two) times daily.   LISINOPRIL (ZESTRIL) 20 MG TABLET    Take 1 tablet (20 mg total) by mouth daily.   NETARSUDIL-LATANOPROST (ROCKLATAN) 0.02-0.005 % SOLN    Place 1 drop into the right eye every evening.   OMEPRAZOLE (PRILOSEC) 20 MG CAPSULE    Take 1 capsule (20 mg total) by mouth daily.  PREDNISOLONE ACETATE (PRED FORTE) 1 % OPHTHALMIC SUSPENSION    Place 1 drop into the left eye 2 (two) times daily.   SERTRALINE (ZOLOFT) 100 MG TABLET    Take 1 tablet (100 mg total) by mouth at bedtime.  Modified Medications   No medications on file  Discontinued Medications   ACETAMINOPHEN (TYLENOL) 325 MG TABLET    Take 2 tablets (650 mg total) by mouth every 6 (six) hours as needed.   LISINOPRIL (ZESTRIL) 20 MG TABLET    Take 1 tablet (20 mg total) by mouth daily.    Physical Exam:  Vitals:   03/11/23 1037  BP: 100/70  Pulse: 68  Resp: 16  Temp: 97.6 F (36.4 C)  SpO2: 96%  Weight: 99 lb 3.2 oz (45 kg)  Height: 5' (1.524 m)   Body mass index is 19.37 kg/m. Wt  Readings from Last 3 Encounters:  03/11/23 99 lb 3.2 oz (45 kg)  01/22/23 97 lb 6.4 oz (44.2 kg)  01/21/23 97 lb 6.4 oz (44.2 kg)    Physical Exam Vitals and nursing note reviewed.  Constitutional:      Appearance: Normal appearance.  Cardiovascular:     Rate and Rhythm: Normal rate and regular rhythm.  Pulmonary:     Effort: Pulmonary effort is normal.     Breath sounds: Normal breath sounds.  Neurological:     General: No focal deficit present.     Mental Status: She is alert and oriented to person, place, and time.     Labs reviewed: Basic Metabolic Panel: Recent Labs    11/22/22 1924 01/14/23 1112 01/22/23 1934  NA 140  --  139  K 3.5  --  3.8  CL 103  --  106  CO2 27  --  22  GLUCOSE 128*  --  85  BUN 16  --  26*  CREATININE 0.66  --  0.65  CALCIUM 9.8  --  9.3  TSH  --  0.457  --    Liver Function Tests: Recent Labs    01/22/23 1934  AST 18  ALT 17  ALKPHOS 48  BILITOT 0.5  PROT 6.5  ALBUMIN 3.8   No results for input(s): "LIPASE", "AMYLASE" in the last 8760 hours. No results for input(s): "AMMONIA" in the last 8760 hours. CBC: Recent Labs    11/22/22 1924 01/22/23 1934  WBC 5.9 5.0  NEUTROABS 3.6  --   HGB 13.1 13.1  HCT 41.5 40.9  MCV 89.6 91.1  PLT 186 161   Lipid Panel: No results for input(s): "CHOL", "HDL", "LDLCALC", "TRIG", "CHOLHDL", "LDLDIRECT" in the last 8760 hours. TSH: Recent Labs    01/14/23 1112  TSH 0.457   A1C: Lab Results  Component Value Date   HGBA1C 5.6 06/04/2020     Assessment/Plan  1. Essential hypertension, benign Blood pressure good at 100/70.  Takes lisinopril 20 mg a day  2. Glaucoma of both eyes, unspecified glaucoma type Followed by ophthalmology  3. Moderate episode of recurrent major depressive disorder (HCC) Will add low-dose Abilify to current sertraline and recheck in 2 months   Jacalyn Lefevre, MD [facility] [phone]

## 2023-03-12 ENCOUNTER — Ambulatory Visit: Payer: Self-pay

## 2023-03-12 NOTE — Patient Outreach (Signed)
  Care Coordination   03/12/2023 Name: [name] MRN: [id number] DOB: 1927/07/14   Care Coordination Outreach Attempts:  An unsuccessful telephone outreach was attempted for a scheduled appointment today.  Follow Up Plan:  Additional outreach attempts will be made to offer the patient care coordination information and services.   Encounter Outcome:  No Answer   Care Coordination Interventions:  No, not indicated    Delsa Sale, RN, BSN, CCM Care Management Coordinator [facility] Care Management Direct Phone: [phone]

## 2023-03-12 NOTE — Patient Outreach (Addendum)
  Care Coordination   Follow Up Visit Note   03/11/2023 Name: [name] MRN: [id number] DOB: 1926/10/11  [name] is a 89 y.o. year old female who sees Frederica Kuster, MD for primary care. I engaged with [name] in the providers office today.  What matters to the patients health and wellness today?  Symptom Management and Level of Care options    Goals Addressed             This Visit's Progress    LCSW-Obtain Supportive Resources   On track    Activities and task to complete in order to accomplish goals.   Keep all upcoming appointments discussed today Continue with compliance of taking medication prescribed by Doctor Implement healthy coping skills discussed to assist with management of symptoms Continue working with [facility] care team to assist with goals identified F/up with [facility] F/up with Dr. Dione Booze office regarding medication review         SDOH assessments and interventions completed:  No     Care Coordination Interventions:  Yes, provided  Interventions Today    Flowsheet Row Most Recent Value  Chronic Disease   Chronic disease during today's visit Hypertension (HTN), Diabetes, Other  [MDD and GAD]  General Interventions   General Interventions Discussed/Reviewed General Interventions Reviewed, Doctor Visits, Community Resources, Level of Care, Communication with  Doctor Visits Discussed/Reviewed Doctor Visits Reviewed  Communication with --  [employer] collaborated with [name], housing coordinator]  Level of Care Assisted Living, Personal Care Services, Applications  Mental Health Interventions   Mental Health Discussed/Reviewed Mental Health Reviewed, Coping Strategies, Depression, Anxiety, Grief and Loss  Nutrition Interventions   Nutrition Discussed/Reviewed Nutrition Reviewed  Pharmacy Interventions   Pharmacy Dicussed/Reviewed Pharmacy Topics Reviewed, Medication Adherence  Safety Interventions   Safety Discussed/Reviewed Safety Reviewed,  Fall Risk       Follow up plan: Follow up call scheduled for 1-2 weeks    Encounter Outcome:  Pt. Visit Completed   [name], MSW, LCSW [facility] Care Management [facility]  [facility] [email or web address] Phone [phone] 12:05 PM

## 2023-03-12 NOTE — Patient Instructions (Signed)
Visit Information  Thank you for taking time to visit with me today. Please don't hesitate to contact me if I can be of assistance to you.   Following are the goals we discussed today:   Goals Addressed             This Visit's Progress    LCSW-Obtain Supportive Resources   On track    Activities and task to complete in order to accomplish goals.   Keep all upcoming appointments discussed today Continue with compliance of taking medication prescribed by Doctor Implement healthy coping skills discussed to assist with management of symptoms Continue working with [facility] care team to assist with goals identified F/up with [facility] F/up with Dr. Dione Booze office regarding medication review         Please call the care guide team at [phone] if you need to cancel or reschedule your appointment.   If you are experiencing a Mental Health or Behavioral Health Crisis or need someone to talk to, please call the Suicide and Crisis Lifeline: 988 call 911   Patient verbalizes understanding of instructions and care plan provided today and agrees to view in MyChart. Active MyChart status and patient understanding of how to access instructions and care plan via MyChart confirmed with patient.     [name], MSW, LCSW [facility] Care Management   [facility] [email or web address] Phone [phone] 12:02 PM

## 2023-03-25 ENCOUNTER — Ambulatory Visit: Payer: Self-pay

## 2023-03-25 DIAGNOSIS — H43813 Vitreous degeneration, bilateral: Secondary | ICD-10-CM | POA: Diagnosis not present

## 2023-03-25 DIAGNOSIS — H401122 Primary open-angle glaucoma, left eye, moderate stage: Secondary | ICD-10-CM | POA: Diagnosis not present

## 2023-03-25 DIAGNOSIS — H401113 Primary open-angle glaucoma, right eye, severe stage: Secondary | ICD-10-CM | POA: Diagnosis not present

## 2023-03-25 DIAGNOSIS — Z961 Presence of intraocular lens: Secondary | ICD-10-CM | POA: Diagnosis not present

## 2023-03-25 NOTE — Patient Outreach (Signed)
  Care Coordination   03/25/2023 Name: [name] MRN: [id number] DOB: April 14, 1927   Care Coordination Outreach Attempts:  An unsuccessful telephone outreach was attempted for a scheduled appointment today.  Follow Up Plan:  Additional outreach attempts will be made to offer the patient care coordination information and services.   Encounter Outcome:  No Answer   Care Coordination Interventions:  No, not indicated    Delsa Sale, RN, BSN, CCM Care Management Coordinator [facility] Care Management  Direct Phone: [phone]'

## 2023-03-26 DIAGNOSIS — Z515 Encounter for palliative care: Secondary | ICD-10-CM | POA: Diagnosis not present

## 2023-03-30 ENCOUNTER — Telehealth: Payer: Self-pay | Admitting: Licensed Clinical Social Worker

## 2023-04-01 NOTE — Patient Outreach (Signed)
  Care Coordination   Follow Up Visit Note   03/30/2023 Name: [name] MRN: [id number] DOB: 08-25-1926  [name] is a 89 y.o. year old female who sees Frederica Kuster, MD for primary care. I spoke with  [name] coordinator, [name] by phone today.  What matters to the patients health and wellness today?  Aid Services    Goals Addressed             This Visit's Progress    LCSW-Obtain Supportive Resources   On track    Activities and task to complete in order to accomplish goals.   Keep all upcoming appointments discussed today Continue with compliance of taking medication prescribed by Doctor Implement healthy coping skills discussed to assist with management of symptoms Continue working with [facility] care team to assist with goals identified F/up with Care Linx F/up with Dr. Dione Booze office regarding medication review         SDOH assessments and interventions completed:  No     Care Coordination Interventions:  Yes, provided  Interventions Today    Flowsheet Row Most Recent Value  Chronic Disease   Chronic disease during today's visit Hypertension (HTN), Diabetes, Other  [MDD and GAD]  General Interventions   General Interventions Discussed/Reviewed General Interventions Reviewed, [facility], Doctor Visits, Level of Care  Doctor Visits Discussed/Reviewed Doctor Visits Reviewed  Level of Care Personal Care Services  Mental Health Interventions   Mental Health Discussed/Reviewed Mental Health Reviewed, Coping Strategies, Anxiety       Follow up plan: Follow up call scheduled for 1-2 weeks    Encounter Outcome:  Pt. Visit Completed   [name], MSW, LCSW [facility] Care Management [facility]  [facility] [email or web address] Phone [phone] 9:20 AM

## 2023-04-01 NOTE — Patient Instructions (Signed)
Visit Information  Thank you for taking time to visit with me today. Please don't hesitate to contact me if I can be of assistance to you.   Following are the goals we discussed today:   Goals Addressed             This Visit's Progress    LCSW-Obtain Supportive Resources   On track    Activities and task to complete in order to accomplish goals.   Keep all upcoming appointments discussed today Continue with compliance of taking medication prescribed by Doctor Implement healthy coping skills discussed to assist with management of symptoms Continue working with [facility] care team to assist with goals identified F/up with Care Linx F/up with Dr. Dione Booze office regarding medication review         Our next appointment is by telephone on 08/16 at 11 AM  Please call the care guide team at [phone] if you need to cancel or reschedule your appointment.   If you are experiencing a Mental Health or Behavioral Health Crisis or need someone to talk to, please call the Suicide and Crisis Lifeline: 988 call 911   Patient verbalizes understanding of instructions and care plan provided today and agrees to view in MyChart. Active MyChart status and patient understanding of how to access instructions and care plan via MyChart confirmed with patient.     [name], MSW, LCSW [facility] Care Management [place]  [facility] [email or web address] Phone [phone] 9:21 AM

## 2023-04-02 ENCOUNTER — Telehealth: Payer: Self-pay

## 2023-04-02 ENCOUNTER — Other Ambulatory Visit: Payer: Self-pay

## 2023-04-02 NOTE — Progress Notes (Signed)
   04/02/2023  Patient ID: [name], female   DOB: 04/26/1927, 89 y.o.   MRN: [id number]  [name] from [facility] called stating the patient is out of all of her medications that she has delivered to her.  She is not sure if the patient is due for refills, or if she lost some of the medication; because when she went in her room she found some pills on the flooor.  Contacted PharmD that assists with adherence packaging/delivery patients.  [name] gabapentin, lisinopril, omeprazole, aripiprazole, and sertraline are due to be filled.  These are all of her oral maintenance medications, so the pharmacy will go ahead an start adherence packaging for her at this time, too.  These will be filled and should be delivered to [name] 8/19.    I have made [name] aware of the above information.  [name], PharmD, DPLA

## 2023-04-03 ENCOUNTER — Ambulatory Visit: Payer: Self-pay | Admitting: Licensed Clinical Social Worker

## 2023-04-03 ENCOUNTER — Other Ambulatory Visit: Payer: Self-pay

## 2023-04-07 ENCOUNTER — Ambulatory Visit: Payer: Self-pay | Admitting: Licensed Clinical Social Worker

## 2023-04-07 NOTE — Patient Outreach (Signed)
  Care Coordination   Follow Up Visit Note   04/03/2023 Name: [name] MRN: [id number] DOB: [birth year]  [name] is a 89 y.o. year old female who sees Venita Sheffield, MD for primary care. I spoke with  [name] by phone today.  What matters to the patients health and wellness today?  Symptom Management and Pharmacy Needs    Goals Addressed             This Visit's Progress    LCSW-Obtain Supportive Resources   On track    Activities and task to complete in order to accomplish goals.   Keep all upcoming appointments discussed today Continue with compliance of taking medication prescribed by Doctor Implement healthy coping skills discussed to assist with management of symptoms Continue working with [facility] care team to assist with goals identified F/up with Care Linx F/up with Dr. Dione Booze office regarding medication review         SDOH assessments and interventions completed:  No     Care Coordination Interventions:  Yes, provided  Interventions Today    Flowsheet Row Most Recent Value  Chronic Disease   Chronic disease during today's visit Hypertension (HTN), Diabetes, Other  [MDD and GAD]  General Interventions   General Interventions Discussed/Reviewed General Interventions Reviewed, Doctor Visits  [Dr. Dione Booze placed a stent in left eye and it didn't work. States she is gradually losing eyesight in that eye.]  Doctor Visits Discussed/Reviewed Doctor Visits Reviewed  Mental Health Interventions   Mental Health Discussed/Reviewed Mental Health Reviewed, Coping Strategies, Depression, Anxiety  [Pt identified triggers to increase in symptoms. Healthy coping skills discussed]  Nutrition Interventions   Nutrition Discussed/Reviewed Nutrition Reviewed  Pharmacy Interventions   Pharmacy Dicussed/Reviewed Pharmacy Topics Reviewed, Medication Adherence  [LCSW informed pt that per chart review, she should obtain pill packs to assist with med compliance early next  week. Pt very appreciative]  Safety Interventions   Safety Discussed/Reviewed Safety Reviewed       Follow up plan: Follow up call scheduled for 1-2 weeks    Encounter Outcome:  Pt. Visit Completed   [name], MSW, LCSW [facility] Care Management [facility]  [facility] [email or web address] Phone [phone] 9:32 AM

## 2023-04-07 NOTE — Patient Instructions (Signed)
Visit Information  Thank you for taking time to visit with me today. Please don't hesitate to contact me if I can be of assistance to you.   Following are the goals we discussed today:   Goals Addressed             This Visit's Progress    LCSW-Obtain Supportive Resources   On track    Activities and task to complete in order to accomplish goals.   Keep all upcoming appointments discussed today Continue with compliance of taking medication prescribed by Doctor Implement healthy coping skills discussed to assist with management of symptoms Continue working with [facility] care team to assist with goals identified F/up with Care Linx F/up with Dr. Dione Booze office regarding medication review         Our next appointment is by telephone on 08/20 at 10 AM  Please call the care guide team at [phone] if you need to cancel or reschedule your appointment.   If you are experiencing a Mental Health or Behavioral Health Crisis or need someone to talk to, please call the Suicide and Crisis Lifeline: 988 call 911   Patient verbalizes understanding of instructions and care plan provided today and agrees to view in MyChart. Active MyChart status and patient understanding of how to access instructions and care plan via MyChart confirmed with patient.     [name], MSW, LCSW [facility] Care Management [place]  [facility] [email or web address] Phone [phone] 9:33 AM

## 2023-04-08 NOTE — Patient Outreach (Signed)
  Care Coordination   Follow Up Visit Note   04/07/2023 Name: AKEMI BURLEW MRN: [id number] DOB: 1927/05/13  [name] is a 89 y.o. year old female who sees Venita Sheffield, MD for primary care. I spoke with  [name] independent living coordinator, Lynden Ang, by phone today.  What matters to the patients health and wellness today? Aid and Symptom Management    Goals Addressed             This Visit's Progress    LCSW-Obtain Supportive Resources   On track    Activities and task to complete in order to accomplish goals.   Keep all upcoming appointments discussed today Continue with compliance of taking medication prescribed by Doctor Implement healthy coping skills discussed to assist with management of symptoms Continue working with [facility] care team to assist with goals identified F/up with Care Linx F/up with Dr. Dione Booze office regarding medication review         SDOH assessments and interventions completed:  No     Care Coordination Interventions:  Yes, provided  Interventions Today    Flowsheet Row Most Recent Value  Chronic Disease   Chronic disease during today's visit Diabetes, Hypertension (HTN), Other  [MDD and GAD]  General Interventions   General Interventions Discussed/Reviewed General Interventions Reviewed, Doctor Visits, Community Resources  Doctor Visits Discussed/Reviewed Doctor Visits Reviewed  Level of Care Personal Care Services  [Patient has approximately 8 more aid hours remaining through insurance]  Mental Health Interventions   Mental Health Discussed/Reviewed Mental Health Reviewed, Anxiety, Depression  [Per [name], pt continues to exhibit symptoms of paranoia and irritability. Endorses that someone comes into her unit and misplaces items, despite door being locked.]  Nutrition Interventions   Nutrition Discussed/Reviewed Nutrition Reviewed  Pharmacy Interventions   Pharmacy Dicussed/Reviewed Pharmacy Topics Reviewed, Medication Adherence   [Pt received pill packs]  Safety Interventions   Safety Discussed/Reviewed Safety Reviewed       Follow up plan: Follow up call scheduled for 1-2 weeks    Encounter Outcome:  Pt. Visit Completed   [name], MSW, LCSW [facility] Care Management [facility] Health  [facility] [email or web address] Phone [phone] 6:32 AM

## 2023-04-08 NOTE — Patient Instructions (Signed)
Visit Information  Thank you for taking time to visit with me today. Please don't hesitate to contact me if I can be of assistance to you.   Following are the goals we discussed today:   Goals Addressed             This Visit's Progress    LCSW-Obtain Supportive Resources   On track    Activities and task to complete in order to accomplish goals.   Keep all upcoming appointments discussed today Continue with compliance of taking medication prescribed by Doctor Implement healthy coping skills discussed to assist with management of symptoms Continue working with [facility] care team to assist with goals identified F/up with Care Linx F/up with Dr. Dione Booze office regarding medication review         Our next appointment is by telephone on 08/28 at 10 AM  Please call the care guide team at [phone] if you need to cancel or reschedule your appointment.   If you are experiencing a Mental Health or Behavioral Health Crisis or need someone to talk to, please call the Suicide and Crisis Lifeline: 988 call 911   Patient verbalizes understanding of instructions and care plan provided today and agrees to view in MyChart. Active MyChart status and patient understanding of how to access instructions and care plan via MyChart confirmed with patient.     [name], MSW, LCSW [facility] Care Management   [facility] [email or web address] Phone [phone] 6:33 AM

## 2023-04-09 ENCOUNTER — Telehealth: Payer: Self-pay

## 2023-04-09 NOTE — Progress Notes (Signed)
   04/09/2023  Patient ID: [name], female   DOB: [birth year], 89 y.o.   MRN: [id number]  Coordinator of [name] is calling to request a list of ALF/SNF at request of patient's son.  Patient is starting to see and hear people coming into her apartment that actually are not present.  She spoke with LCSW previously, but she is out of town; and they are needing resources as soon as possible.  Coordinating with PCP/social work team to see if there is anyone covering that can help provide information.  Email of suggested list of facilities can be sent to Mearl Latin at [facility] at [email or web address].  [name], PharmD, DPLA

## 2023-04-15 ENCOUNTER — Ambulatory Visit: Payer: Self-pay | Admitting: Licensed Clinical Social Worker

## 2023-04-15 ENCOUNTER — Telehealth: Payer: Self-pay | Admitting: *Deleted

## 2023-04-15 NOTE — Progress Notes (Signed)
  Care Coordination Note  04/15/2023 Name: [name] MRN: [id number] DOB: 1927/03/01  [name] is a 89 y.o. year old female who is a primary care patient of Venita Sheffield, MD and is actively engaged with the care management team. I reached out to [name] by phone today to assist with re-scheduling a follow up visit with the RN Case Manager  Follow up plan: Unsuccessful telephone outreach attempt made. A HIPAA compliant phone message was left for the patient providing contact information and requesting a return call.   [facility]  Care Coordination Care Guide  Direct Dial: [phone]

## 2023-04-15 NOTE — Progress Notes (Signed)
  Care Coordination Note  04/15/2023 Name: [name] MRN: [id number] DOB: 27-Mar-1927  [name] is a 89 y.o. year old female who is a primary care patient of Venita Sheffield, MD and is actively engaged with the care management team. I reached out to [name] by phone today to assist with re-scheduling a follow up visit with the RN Case Manager  Follow up plan: Telephone appointment with care management team member scheduled for:04/24/23  SIGNATURE

## 2023-04-17 ENCOUNTER — Telehealth: Payer: Self-pay | Admitting: Licensed Clinical Social Worker

## 2023-04-17 NOTE — Patient Outreach (Signed)
  Care Coordination   Follow Up Visit Note   04/15/2023 Name: ISOLENE FORNAL MRN: [id number] DOB: [birth year]  [name] is a 89 y.o. year old female who sees Venita Sheffield, MD for primary care. I spoke with  Alben Spittle [place]'s son, Ric by phone today.  What matters to the patients health and wellness today?  Level of Care     SDOH assessments and interventions completed:  No     Care Coordination Interventions:  Yes, provided   Follow up plan: Follow up call scheduled for 1-2 weeks    Encounter Outcome:  Pt. Visit Completed   [name], MSW, LCSW [facility] Care Management [facility] Health  [facility] [email or web address] Phone [phone] 8:08 AM

## 2023-04-17 NOTE — Patient Outreach (Signed)
  Care Coordination   Follow Up Visit Note   04/14/2023 Name: [name] MRN: [id number] DOB: 10/11/1926  [name] is a 89 y.o. year old female who sees Christine Sheffield, MD for primary care. I spoke with  [name] unit coordinator, [name], by phone today.  What matters to the patients health and wellness today?  Level of Care    Goals Addressed             This Visit's Progress    LCSW-Obtain Supportive Resources   On track    Activities and task to complete in order to accomplish goals.   Keep all upcoming appointments discussed today Continue with compliance of taking medication prescribed by Doctor Implement healthy coping skills discussed to assist with management of symptoms Continue working with [facility] care team to assist with goals identified F/up with Care Linx F/up with Dr. Dione Carter office regarding medication review         SDOH assessments and interventions completed:  No     Care Coordination Interventions:  Yes, provided  Interventions Today    Flowsheet Row Most Recent Value  Chronic Disease   Chronic disease during today's visit Diabetes, Hypertension (HTN), Other  [MDD and GAD]  General Interventions   General Interventions Discussed/Reviewed General Interventions Reviewed, Level of Care, Doctor Visits  Doctor Visits Discussed/Reviewed Doctor Visits Reviewed  Level of Care Assisted Living, Personal Care Services  Mental Health Interventions   Mental Health Discussed/Reviewed Mental Health Reviewed  Safety Interventions   Safety Discussed/Reviewed Safety Reviewed       Follow up plan:  LCSW will f/up with pt's son to discuss patient care needs    Encounter Outcome:  Pt. Visit Completed   [name], MSW, LCSW [facility] Care Management [facility] Health  [facility] [email or web address] Phone [phone] 5:30 AM

## 2023-04-17 NOTE — Patient Instructions (Signed)
Visit Information  Thank you for taking time to visit with me today. Please don't hesitate to contact me if I can be of assistance to you.   Following are the goals we discussed today:   Goals Addressed             This Visit's Progress    LCSW-Obtain Supportive Resources   On track    Activities and task to complete in order to accomplish goals.   Keep all upcoming appointments discussed today Continue with compliance of taking medication prescribed by Doctor Implement healthy coping skills discussed to assist with management of symptoms Continue working with [facility] care team to assist with goals identified F/up with Care Linx F/up with Dr. Dione Booze office regarding medication review         Our next appointment is by telephone on 08/28   Please call the care guide team at [phone] if you need to cancel or reschedule your appointment.   If you are experiencing a Mental Health or Behavioral Health Crisis or need someone to talk to, please call the Suicide and Crisis Lifeline: 988 call 911   Patient verbalizes understanding of instructions and care plan provided today and agrees to view in MyChart. Active MyChart status and patient understanding of how to access instructions and care plan via MyChart confirmed with patient.     [name], MSW, LCSW [facility] Care Management [place]  [facility] [email or web address] Phone [phone] 5:31 AM

## 2023-04-17 NOTE — Patient Instructions (Signed)
Visit Information  Thank you for taking time to visit with me today. Please don't hesitate to contact me if I can be of assistance to you.   Following are the goals we discussed today:   Goals Addressed   None     Please call the care guide team at [phone] if you need to cancel or reschedule your appointment.   If you are experiencing a Mental Health or Behavioral Health Crisis or need someone to talk to, please call the Suicide and Crisis Lifeline: 988 call 911   Patient verbalizes understanding of instructions and care plan provided today and agrees to view in MyChart. Active MyChart status and patient understanding of how to access instructions and care plan via MyChart confirmed with patient.     [name], MSW, LCSW [facility] Care Management [place]  [facility] [email or web address] Phone [phone] 8:08 AM

## 2023-04-22 ENCOUNTER — Ambulatory Visit: Payer: Self-pay

## 2023-04-22 NOTE — Patient Instructions (Signed)
Visit Information  Thank you for taking time to visit with me today. Please don't hesitate to contact me if I can be of assistance to you.   Following are the goals we discussed today:   Goals Addressed             This Visit's Progress    To get help with worsening depression   Not on track    Care Coordination Interventions: Received message from [facility] care guide Gwenevere Ghazi advising patient's care ambassador, [name] is requesting a return call (patient previously provided verbal consent to this RN to speak with [name] regarding PHI when needed) Evaluation of current treatment plan related to depression and patient's adherence to plan as established by provider Determined [name] is very concerned about the patient's ongoing paranoia, depression and the inability to care for herself Discussed 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 and will have no help in her independent living home Discussed [name] has initiated an APS referral due to having concerns and patient's son who lives in [place] is not present or making arrangements to be present in order to help patient with her level of care needs Determined [name] cannot see a difference in patient's depression since starting and increasing antidepressant meds per PCP Reviewed and discussed patient's upcoming appointment with her PCP in which [name] plans to attend to discuss these concerns with the PCP Discussed the independent living facility may evict patient from her current residence Reviewed and discussed this RN will alert LCSW [name] of [name]'s concerns and will ask Jasmine to help facilitate placement if patient is agreeable and PCP indicates the appropriate recommendation for placement Forwarded message to [name] LCSW with a patient status update        Our next appointment is by telephone on 04/24/23 at 2:00 PM  Please call the care guide team at [phone] if you need to cancel or reschedule your  appointment.   If you are experiencing a Mental Health or Behavioral Health Crisis or need someone to talk to, please call 1-800-273-TALK (toll free, 24 hour hotline)  Patient verbalizes understanding of instructions and care plan provided today and agrees to view in MyChart. Active MyChart status and patient understanding of how to access instructions and care plan via MyChart confirmed with patient.     Delsa Sale, RN, BSN, CCM Care Management Coordinator [facility] Care Management Direct Phone: [phone]

## 2023-04-22 NOTE — Patient Outreach (Signed)
  Care Coordination   Follow Up Visit Note   04/22/2023 Name: [name] MRN: [id number] DOB: 05-Aug-1927  [name] is a 89 y.o. year old female who sees Venita Sheffield, MD for primary care. I spoke with patient's care ambassador [name] by phone today.  What matters to the patients health and wellness today?  na    Goals Addressed             This Visit's Progress    To get help with worsening depression   Not on track    Care Coordination Interventions: Received message from [facility] care guide Gwenevere Ghazi advising patient's care ambassador, [name] is requesting a return call (patient previously provided verbal consent to this RN to speak with [name] regarding PHI when needed) Evaluation of current treatment plan related to depression and patient's adherence to plan as established by provider Determined [name] is very concerned about the patient's ongoing paranoia, depression and the inability to care for herself Discussed 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 and will have no help in her independent living home Discussed [name] has initiated an APS referral due to having concerns and patient's son who lives in [place] is not present or making arrangements to be present in order to help patient with her level of care needs Determined [name] cannot see a difference in patient's depression since starting and increasing antidepressant meds per PCP Reviewed and discussed patient's upcoming appointment with her PCP in which [name] plans to attend to discuss these concerns with the PCP Discussed the independent living facility may evict patient from her current residence Reviewed and discussed this RN will alert LCSW [name] of [name]'s concerns and will ask Jasmine to help facilitate placement if patient is agreeable and PCP indicates the appropriate recommendation for placement Forwarded message to [name] LCSW with a patient status update     Interventions Today    Flowsheet Row Most Recent Value  Chronic Disease   Chronic disease during today's visit Other  [dementia]  General Interventions   General Interventions Discussed/Reviewed General Interventions Discussed, General Interventions Reviewed, Doctor Visits, Communication with  Doctor Visits Discussed/Reviewed Doctor Visits Discussed, Doctor Visits Reviewed, PCP  Communication with Social Work  [name] LCSW]  Level of Care Assisted Living, Personal Care Services  Education Interventions   Education Provided Provided Education  Provided Verbal Education On Mental Health/Coping with Illness, When to see the doctor  Mental Health Interventions   Mental Health Discussed/Reviewed Mental Health Discussed, Mental Health Reviewed, Depression, Anxiety, Refer to Social Work for resources, Refer to Social Work for counseling  Refer to Social Work for counseling regarding Depression, Anxiety/Coping  Refer to Social Work for resources regarding Assisted Living/Skilled Holiday representative, Mental Health  Pharmacy Interventions   Pharmacy Dicussed/Reviewed Pharmacy Topics Discussed, Pharmacy Topics Reviewed, Medications and their functions  Safety Interventions   Safety Discussed/Reviewed Safety Discussed, Safety Reviewed, Fall Risk, Home Safety  Home Safety Refer for community resources          SDOH assessments and interventions completed:  No     Care Coordination Interventions:  Yes, provided   Follow up plan: Referral made to [name] LCSW Follow up call scheduled for 04/24/23 @2 :00 PM    Encounter Outcome:  Patient Visit Completed

## 2023-04-23 ENCOUNTER — Telehealth: Payer: Self-pay | Admitting: Licensed Clinical Social Worker

## 2023-04-24 ENCOUNTER — Ambulatory Visit: Payer: Self-pay

## 2023-04-24 NOTE — Patient Outreach (Signed)
  Care Coordination   Follow Up Visit Note   04/23/2023 Name: [name] MRN: [id number] DOB: [birth year]  [name] is a 89 y.o. year old female who sees Venita Sheffield, MD for primary care. I  engaged Secretary/administrator, Nurse, adult at [employer] to the patients health and wellness today?  Symptom Managment    Goals Addressed             This Visit's Progress    LCSW-Obtain Supportive Resources   On track    Activities and task to complete in order to accomplish goals.   Keep all upcoming appointments discussed today Continue with compliance of taking medication prescribed by Doctor Implement healthy coping skills discussed to assist with management of symptoms Continue working with [facility] care team to assist with goals identified F/up with Care Linx F/up with Dr. Dione Booze office regarding medication review         SDOH assessments and interventions completed:  No     Care Coordination Interventions:  Yes, provided  Interventions Today    Flowsheet Row Most Recent Value  Chronic Disease   Chronic disease during today's visit Diabetes, Other  [MDD and GAD]  General Interventions   General Interventions Discussed/Reviewed General Interventions Reviewed, Community Resources  Doctor Visits Discussed/Reviewed Doctor Visits Reviewed  [name] will accompany pt to upcoming PCP appt to discuss mental and cognitive decline]  Level of Care Assisted Living, Skilled Nursing Facility  Mental Health Interventions   Mental Health Discussed/Reviewed Mental Health Reviewed  Safety Interventions   Safety Discussed/Reviewed Safety Reviewed, Home Safety       Follow up plan: Follow up call scheduled for 1-2 weeks    Encounter Outcome:  Patient Visit Completed   [name], MSW, LCSW [facility] Care Management [facility]  [facility] [email or web address] Phone [phone] 12:08 AM

## 2023-04-24 NOTE — Patient Outreach (Signed)
  Care Coordination   04/24/2023 Name: [name] MRN: [id number] DOB: 1927-07-25   Care Coordination Outreach Attempts:  An unsuccessful telephone outreach was attempted for a scheduled appointment today.  Follow Up Plan:  Additional outreach attempts will be made to offer the patient care coordination information and services.   Encounter Outcome:  No Answer   Care Coordination Interventions:  No, not indicated    Delsa Sale, RN, BSN, CCM Care Management Coordinator [facility] Care Management  Direct Phone: [phone]

## 2023-04-24 NOTE — Patient Instructions (Signed)
Visit Information  Thank you for taking time to visit with me today. Please don't hesitate to contact me if I can be of assistance to you.   Following are the goals we discussed today:   Goals Addressed             This Visit's Progress    LCSW-Obtain Supportive Resources   On track    Activities and task to complete in order to accomplish goals.   Keep all upcoming appointments discussed today Continue with compliance of taking medication prescribed by Doctor Implement healthy coping skills discussed to assist with management of symptoms Continue working with [facility] care team to assist with goals identified F/up with Care Linx F/up with Dr. Dione Booze office regarding medication review          Please call the care guide team at [phone] if you need to cancel or reschedule your appointment.   If you are experiencing a Mental Health or Behavioral Health Crisis or need someone to talk to, please call the Suicide and Crisis Lifeline: 988 call 911   Patient verbalizes understanding of instructions and care plan provided today and agrees to view in MyChart. Active MyChart status and patient understanding of how to access instructions and care plan via MyChart confirmed with patient.     [name], MSW, LCSW [facility] Care Management   [facility] [email or web address] Phone [phone] 12:08 AM

## 2023-04-25 ENCOUNTER — Other Ambulatory Visit: Payer: Self-pay | Admitting: Family Medicine

## 2023-04-25 ENCOUNTER — Other Ambulatory Visit: Payer: Self-pay

## 2023-04-25 DIAGNOSIS — F331 Major depressive disorder, recurrent, moderate: Secondary | ICD-10-CM

## 2023-04-27 ENCOUNTER — Other Ambulatory Visit: Payer: Self-pay

## 2023-04-27 ENCOUNTER — Other Ambulatory Visit (HOSPITAL_COMMUNITY): Payer: Self-pay

## 2023-04-27 MED ORDER — GABAPENTIN 300 MG PO CAPS
300.0000 mg | ORAL_CAPSULE | Freq: Two times a day (BID) | ORAL | 1 refills | Status: AC
Start: 2023-04-27 — End: ?
  Filled 2023-04-27: qty 60, 30d supply, fill #0
  Filled 2023-05-26: qty 60, 30d supply, fill #1
  Filled 2023-06-24: qty 60, 30d supply, fill #2
  Filled 2023-07-22 – 2023-07-28 (×3): qty 60, 30d supply, fill #3

## 2023-04-27 MED ORDER — OMEPRAZOLE 20 MG PO CPDR
20.0000 mg | DELAYED_RELEASE_CAPSULE | Freq: Every day | ORAL | 1 refills | Status: AC
  Filled 2023-04-27: qty 30, 30d supply, fill #0
  Filled 2023-05-26: qty 30, 30d supply, fill #1
  Filled 2023-06-24: qty 30, 30d supply, fill #2
  Filled 2023-07-22 – 2023-07-28 (×3): qty 30, 30d supply, fill #3

## 2023-04-27 MED ORDER — ARIPIPRAZOLE 2 MG PO TABS
2.0000 mg | ORAL_TABLET | Freq: Every day | ORAL | 1 refills | Status: DC
Start: 2023-04-27 — End: 2023-04-29
  Filled 2023-04-27: qty 30, 30d supply, fill #0

## 2023-04-28 ENCOUNTER — Other Ambulatory Visit: Payer: Self-pay

## 2023-04-29 ENCOUNTER — Other Ambulatory Visit: Payer: Self-pay

## 2023-04-29 ENCOUNTER — Encounter: Payer: Self-pay | Admitting: Licensed Clinical Social Worker

## 2023-04-29 ENCOUNTER — Telehealth: Payer: Self-pay

## 2023-04-29 ENCOUNTER — Ambulatory Visit (INDEPENDENT_AMBULATORY_CARE_PROVIDER_SITE_OTHER): Payer: Medicare Other | Admitting: Sports Medicine

## 2023-04-29 ENCOUNTER — Encounter: Payer: Self-pay | Admitting: Sports Medicine

## 2023-04-29 ENCOUNTER — Other Ambulatory Visit (HOSPITAL_COMMUNITY): Payer: Self-pay

## 2023-04-29 ENCOUNTER — Other Ambulatory Visit (HOSPITAL_BASED_OUTPATIENT_CLINIC_OR_DEPARTMENT_OTHER): Payer: Self-pay

## 2023-04-29 VITALS — BP 102/70 | HR 84 | Temp 97.7°F | Resp 16 | Ht 60.0 in | Wt 97.6 lb

## 2023-04-29 DIAGNOSIS — E785 Hyperlipidemia, unspecified: Secondary | ICD-10-CM | POA: Insufficient documentation

## 2023-04-29 DIAGNOSIS — F3341 Major depressive disorder, recurrent, in partial remission: Secondary | ICD-10-CM | POA: Diagnosis not present

## 2023-04-29 DIAGNOSIS — I10 Essential (primary) hypertension: Secondary | ICD-10-CM

## 2023-04-29 DIAGNOSIS — Z23 Encounter for immunization: Secondary | ICD-10-CM | POA: Diagnosis not present

## 2023-04-29 DIAGNOSIS — R443 Hallucinations, unspecified: Secondary | ICD-10-CM

## 2023-04-29 MED ORDER — QUETIAPINE FUMARATE ER 50 MG PO TB24
50.0000 mg | ORAL_TABLET | Freq: Every day | ORAL | 2 refills | Status: AC
Start: 2023-04-29 — End: ?
  Filled 2023-04-29: qty 30, 30d supply, fill #0
  Filled 2023-05-26: qty 30, 30d supply, fill #1
  Filled 2023-06-24: qty 30, 30d supply, fill #2
  Filled 2023-07-22 – 2023-07-28 (×3): qty 30, 30d supply, fill #3

## 2023-04-29 MED ORDER — LISINOPRIL 10 MG PO TABS
10.0000 mg | ORAL_TABLET | Freq: Every day | ORAL | 3 refills | Status: AC
  Filled 2023-04-29: qty 30, 30d supply, fill #0
  Filled 2023-05-26: qty 30, 30d supply, fill #1
  Filled 2023-06-24: qty 30, 30d supply, fill #2
  Filled 2023-07-22 – 2023-07-28 (×3): qty 30, 30d supply, fill #3

## 2023-04-29 NOTE — Telephone Encounter (Signed)
Outgoing call placed to patients son, Minerva Areola [facility]) to confirm that it is ok to add patients social worker who is accompanying her to visit today to chart allowing her to have access to patients information and also to give her access to patients mychart.  Patients daughter Denny Peon is also listed as POA, however has deceased since [facility] paperwork completed.   Leavy Cella, Dr.Veludandi's medical assistant was verbally informed of this conversation

## 2023-04-29 NOTE — Progress Notes (Signed)
Careteam: Patient Care Team: Venita Sheffield, MD as PCP - General (Internal Medicine) Sallye Lat, MD as Consulting Physician (Ophthalmology) Mathews Robinsons, MD as Consulting Physician (Dermatology) Bridgett Larsson, LCSW as Social Worker (Licensed Clinical Social Worker) Little, Karma Lew, RN as [facility] Interior and spatial designer OF SERVICE:  [facility]  Advanced Directive information Does Patient Have a Programmer, multimedia?: Yes, Type of Advance Directive: Healthcare Power of [place];Living will;Out of facility DNR (pink MOST or yellow form), Does patient want to make changes to medical advance directive?: No - Patient declined  Allergies  Allergen Reactions   Penicillins Rash    Chief Complaint  Patient presents with   Medical Management of Chronic Issues    2 month follow up.   Immunizations    Discuss the need for Influenza vaccine, and Shingrix vaccine.   Health Maintenance    Discuss the need for Foot exam, Hemoglobin A1C, Eye exam, and AWV.     HPI: Patient is a 89 y.o. female  Accompanied by SW from the independent living  Pt reports that she is seeing people coming to her house and leaving mess Her SW  reports that hey haven't seen any one in her apartment  Pt states she does not like the house to be messy  Has a son but he is not involved in her care   Pt reports that she is seeing people in her apartment since few months.  She lost her daughter 2 years ago.  She has chronic history of depression and currently on Zoloft.  She was started on Abilify by Dr. Hyacinth Meeker on last visit.  Patient reports that  sometimes does not take the medicine.  All her medicines are delivered to her in a blister pack age, reports the medical problems do can you do that added from her problem list yeah I mean she has been difficulty opening the package and when the pills are too small it is hard for her to pick them from the package and drops them on the  floor        Denies dysuria, hematuria, lower Cries a lot  Denies having suicidal ideation  Eats 2-3 meals/ day  Lives alone in her apartment  Knows her name, oriented to time place Her son manages her finances Pt states that she does not want to go to assisted living , her son is the POA    Review of Systems:  Review of Systems  Constitutional:  Negative for chills and fever.  HENT:  Negative for congestion and sore throat.   Eyes:  Negative for double vision.  Respiratory:  Negative for cough, sputum production and shortness of breath.   Cardiovascular:  Negative for chest pain, palpitations and leg swelling.  Gastrointestinal:  Negative for abdominal pain, heartburn and nausea.  Genitourinary:  Negative for dysuria, frequency and hematuria.  Musculoskeletal:  Negative for falls and myalgias.  Neurological:  Negative for dizziness, sensory change and focal weakness.  Psychiatric/Behavioral:  Positive for depression and hallucinations. Negative for suicidal ideas.     Past Medical History:  Diagnosis Date   Arthritis    Osteoarthritis   Depression    GERD (gastroesophageal reflux disease)    Hyperlipidemia    Osteopenia    Past Surgical History:  Procedure Laterality Date   APPENDECTOMY  1936   CERVICAL FUSION  2004   COLON SURGERY  06/12/2010   Colonoscopy   EYE SURGERY Left 2024   FINGER  SURGERY  1989   MELANOMA EXCISION  11/16/2017   forehead   SPINE SURGERY  2009   Back Fusion   TONSILLECTOMY  1941   Social History:   reports that she has quit smoking. She has never used smokeless tobacco. She reports that she does not currently use alcohol. She reports that she does not use drugs.  History reviewed. No pertinent family history.  Medications: Patient's Medications  New Prescriptions   LISINOPRIL (ZESTRIL) 10 MG TABLET    Take 1 tablet (10 mg total) by mouth daily.   QUETIAPINE (SEROQUEL XR) 50 MG TB24 24 HR TABLET    Take 1 tablet (50 mg total) by  mouth daily.  Previous Medications   ADVIL 200 MG CAPS    Take 200 mg by mouth every 8 (eight) hours as needed (for pain or headaches).   BIOTIN PO    Take 1 capsule by mouth daily.   BRIMONIDINE (ALPHAGAN) 0.2 % OPHTHALMIC SOLUTION    Place 1 drop into the right eye 2 (two) times daily.   DICLOFENAC SODIUM (VOLTAREN) 1 % GEL    Apply 2 g topically 4 (four) times daily.   DORZOLAMIDE-TIMOLOL (COSOPT) 2-0.5 % OPHTHALMIC SOLUTION    Place 1 drop into the right eye 2 (two) times daily.   GABAPENTIN (NEURONTIN) 300 MG CAPSULE    Take 1 capsule (300 mg total) by mouth 2 (two) times daily.   NETARSUDIL-LATANOPROST (ROCKLATAN) 0.02-0.005 % SOLN    Place 1 drop into the right eye every evening.   OMEPRAZOLE (PRILOSEC) 20 MG CAPSULE    Take 1 capsule (20 mg total) by mouth daily.   PREDNISOLONE ACETATE (PRED FORTE) 1 % OPHTHALMIC SUSPENSION    Place 1 drop into the left eye 2 (two) times daily.   SERTRALINE (ZOLOFT) 100 MG TABLET    Take 1 tablet (100 mg total) by mouth at bedtime.  Modified Medications   No medications on file  Discontinued Medications   ARIPIPRAZOLE (ABILIFY) 2 MG TABLET    Take 1 tablet (2 mg total) by mouth daily.   LISINOPRIL (ZESTRIL) 20 MG TABLET    Take 1 tablet (20 mg total) by mouth daily.    Physical Exam:  Vitals:   04/29/23 1317  BP: 102/70  Pulse: 84  Resp: 16  Temp: 97.7 F (36.5 C)  SpO2: 96%  Weight: 97 lb 9.6 oz (44.3 kg)  Height: 5' (1.524 m)   Body mass index is 19.06 kg/m. Wt Readings from Last 3 Encounters:  04/29/23 97 lb 9.6 oz (44.3 kg)  03/11/23 99 lb 3.2 oz (45 kg)  01/22/23 97 lb 6.4 oz (44.2 kg)    Physical Exam Constitutional:      Appearance: Normal appearance.  Cardiovascular:     Rate and Rhythm: Normal rate and regular rhythm.     Pulses: Normal pulses.  Pulmonary:     Effort: No respiratory distress.     Breath sounds: No wheezing or rales.  Abdominal:     General: There is no distension.     Tenderness: There is no  abdominal tenderness. There is no guarding or rebound.  Musculoskeletal:        General: No swelling.  Neurological:     Mental Status: She is alert. Mental status is at baseline.     Labs reviewed: Basic Metabolic Panel: Recent Labs    11/22/22 1924 01/14/23 1112 01/22/23 1934  NA 140  --  139  K 3.5  --  3.8  CL 103  --  106  CO2 27  --  22  GLUCOSE 128*  --  85  BUN 16  --  26*  CREATININE 0.66  --  0.65  CALCIUM 9.8  --  9.3  TSH  --  0.457  --    Liver Function Tests: Recent Labs    01/22/23 1934  AST 18  ALT 17  ALKPHOS 48  BILITOT 0.5  PROT 6.5  ALBUMIN 3.8   No results for input(s): "LIPASE", "AMYLASE" in the last 8760 hours. No results for input(s): "AMMONIA" in the last 8760 hours. CBC: Recent Labs    11/22/22 1924 01/22/23 1934  WBC 5.9 5.0  NEUTROABS 3.6  --   HGB 13.1 13.1  HCT 41.5 40.9  MCV 89.6 91.1  PLT 186 161   Lipid Panel: No results for input(s): "CHOL", "HDL", "LDLCALC", "TRIG", "CHOLHDL", "LDLDIRECT" in the last 8760 hours. TSH: Recent Labs    01/14/23 1112  TSH 0.457   A1C: Lab Results  Component Value Date   HGBA1C 5.6 06/04/2020     Assessment/Plan  1. Need for influenza vaccination  - Flu Vaccine Trivalent High Dose (Fluad)    Hallucinations Will check cbc, bmp Will start risperidal  Cont with zoloft  Follow up in 6 weeks FL2 form filled  - CBC (no diff) - Basic Metabolic Panel with eGFR  4. Primary hypertension Bp on the lower side Will decrease lisinopril to 10 mg  from 20 mg  5. Recurrent major depressive disorder, in partial remission (HCC) Cont with zoloft  Other orders - QUEtiapine (SEROQUEL XR) 50 MG TB24 24 hr tablet; Take 1 tablet (50 mg total) by mouth daily.  Dispense: 90 tablet; Refill: 2 - lisinopril (ZESTRIL) 10 MG tablet; Take 1 tablet (10 mg total) by mouth daily.  Dispense: 90 tablet; Refill: 3    Return in about 3 months (around 07/29/2023).:

## 2023-04-30 ENCOUNTER — Telehealth: Payer: Self-pay

## 2023-04-30 ENCOUNTER — Other Ambulatory Visit: Payer: Self-pay

## 2023-04-30 ENCOUNTER — Ambulatory Visit: Payer: Self-pay

## 2023-04-30 ENCOUNTER — Encounter: Payer: Self-pay | Admitting: Pharmacist

## 2023-04-30 LAB — CBC
HCT: 38.7 % (ref 35.0–45.0)
Hemoglobin: 12.7 g/dL (ref 11.7–15.5)
MCH: 28.6 pg (ref 27.0–33.0)
MCHC: 32.8 g/dL (ref 32.0–36.0)
MCV: 87.2 fL (ref 80.0–100.0)
MPV: 10.3 fL (ref 7.5–12.5)
Platelets: 185 10*3/uL (ref 140–400)
RBC: 4.44 10*6/uL (ref 3.80–5.10)
RDW: 13.8 % (ref 11.0–15.0)
WBC: 5.1 10*3/uL (ref 3.8–10.8)

## 2023-04-30 LAB — BASIC METABOLIC PANEL WITH GFR
BUN: 22 mg/dL (ref 7–25)
CO2: 28 mmol/L (ref 20–32)
Calcium: 10 mg/dL (ref 8.6–10.4)
Chloride: 107 mmol/L (ref 98–110)
Creat: 0.68 mg/dL (ref 0.60–0.95)
Glucose, Bld: 87 mg/dL (ref 65–99)
Potassium: 4.6 mmol/L (ref 3.5–5.3)
Sodium: 143 mmol/L (ref 135–146)
eGFR: 81 mL/min/{1.73_m2} (ref >=60)

## 2023-04-30 NOTE — Telephone Encounter (Signed)
Receptionist with [facility] returned call and stated patient has never mentioned to them that she was diabetic and therefore they have never done a diabetic eye exam on her before, however she has a pending appointment on 06/22/23 and they have noted to perform a diabetic eye exam .

## 2023-04-30 NOTE — Telephone Encounter (Signed)
Incoming fax received from patients pharmacy to initiate a prior authorization for                   .  PA initiated through covermymeds. Key: [id number]

## 2023-04-30 NOTE — Patient Outreach (Signed)
  Care Coordination   Collaboration  Visit Note   04/29/2023 Name: [name] MRN: [id number] DOB: Jul 22, 1927  [name] is a 89 y.o. year old female who sees Venita Sheffield, MD for primary care. I  engaged with PCP office  What matters to the patients health and wellness today?  Patient was not engaged during this encounter     SDOH assessments and interventions completed:  No     Care Coordination Interventions:  Yes, provided  Interventions Today    Flowsheet Row Most Recent Value  Chronic Disease   Chronic disease during today's visit Hypertension (HTN), Other  [MDD and GAD]  General Interventions   General Interventions Discussed/Reviewed Communication with  Communication with RN  [employer] collaborated with CMA regarding FL2 care needs]       Follow up plan: Follow up call scheduled for 1-2 weeks    Encounter Outcome:  Patient Visit Completed   [name], MSW, LCSW [facility] Care Management [facility]  [facility] [email or web address] Phone [phone] 6:21 PM

## 2023-04-30 NOTE — Telephone Encounter (Signed)
Outgoing call placed to Dr.Groat's office requesting clarification on recent correspondence received, as it is unclear if patient is positive or negative for diabetic retinopathy. The receptionist took a message, as the tech was at lunch.   Awaiting return call to update care gap

## 2023-04-30 NOTE — Patient Outreach (Signed)
  Care Coordination   04/30/2023 Name: [name] MRN: [id number] DOB: 1927-05-04   Care Coordination Outreach Attempts:  An unsuccessful telephone outreach was attempted for a scheduled appointment today.  Follow Up Plan:  Additional outreach attempts will be made to offer the patient care coordination information and services.   Encounter Outcome:  No Answer   Care Coordination Interventions:  No, not indicated    Delsa Sale RN BSN CCM   [facility] Nurse Care Coordinator  Direct Dial: [phone] Website: [email or web address]

## 2023-05-01 ENCOUNTER — Telehealth: Payer: Self-pay | Admitting: Licensed Clinical Social Worker

## 2023-05-01 ENCOUNTER — Telehealth: Payer: Self-pay

## 2023-05-01 NOTE — Progress Notes (Signed)
05/01/2023  Patient ID: [name], female   DOB: 22-Apr-1927, 89 y.o.   MRN: [id number]  Returning call to care coordinator for [facility], [name], regarding medication changes after recent PCP visit.    -Ms. Costlow recently seen by Dr. Elvis Coil, and lisinopril dose was decreased to 10mg , Aripiprazole was stopped, and quetiapine 50mg  was initiated -Medication list is current and accurate in our system since this was a [facility] provider -[name] is stating patient needs refills of her eyedrops and curious about cost.  Provided her with the phone number for [facility] to get this information.  [name], PharmD, DPLA

## 2023-05-01 NOTE — Patient Instructions (Signed)
Visit Information  Thank you for taking time to visit with me today. Please don't hesitate to contact me if I can be of assistance to you.   Following are the goals we discussed today:   Goals Addressed             This Visit's Progress    LCSW-Obtain Supportive Resources   On track    Activities and task to complete in order to accomplish goals.   Keep all upcoming appointments discussed today Continue with compliance of taking medication prescribed by Doctor Implement healthy coping skills discussed to assist with management of symptoms Continue working with [facility] care team to assist with goals identified F/up with Care Linx F/up with Dr. Dione Booze office regarding medication review         Please call the care guide team at [phone] if you need to cancel or reschedule your appointment.   If you are experiencing a Mental Health or Behavioral Health Crisis or need someone to talk to, please call the Suicide and Crisis Lifeline: 988 call 911   Patient verbalizes understanding of instructions and care plan provided today and agrees to view in MyChart. Active MyChart status and patient understanding of how to access instructions and care plan via MyChart confirmed with patient.     [name], MSW, LCSW [facility] Care Management [place]  [facility] [email or web address] Phone [phone] 5:40 AM

## 2023-05-01 NOTE — Patient Outreach (Signed)
Care Coordination   Follow Up Visit Note   04/30/2023 Name: EARMA YIELDING MRN: [id number] DOB: September 20, 1926  [name] is a 89 y.o. year old female who sees Venita Sheffield, MD for primary care. I spoke with  [name] care coordinator, [name] by phone today.  What matters to the patients health and wellness today?  Level of care    Goals Addressed             This Visit's Progress    LCSW-Obtain Supportive Resources   On track    Activities and task to complete in order to accomplish goals.   Keep all upcoming appointments discussed today Continue with compliance of taking medication prescribed by Doctor Implement healthy coping skills discussed to assist with management of symptoms Continue working with [facility] care team to assist with goals identified F/up with Care Linx F/up with Dr. Dione Booze office regarding medication review         SDOH assessments and interventions completed:  No     Care Coordination Interventions:  Yes, provided  Interventions Today    Flowsheet Row Most Recent Value  Chronic Disease   Chronic disease during today's visit Hypertension (HTN), Other  [MDD, GAD]  General Interventions   General Interventions Discussed/Reviewed [employer], General Interventions Reviewed, Doctor Visits  Doctor Visits Discussed/Reviewed Doctor Visits Reviewed  Level of Care Adult Daycare  [facility] discussed]  Mental Health Interventions   Mental Health Discussed/Reviewed Mental Health Reviewed, Coping Strategies, Anxiety, Depression       Follow up plan: Follow up call scheduled for 1-2 weeks    Encounter Outcome:  Patient Visit Completed   [name], MSW, LCSW [facility] Care Management [facility]  [facility] [email or web address] Phone [phone] 5:39 AM

## 2023-05-04 ENCOUNTER — Telehealth: Payer: Self-pay | Admitting: Licensed Clinical Social Worker

## 2023-05-05 ENCOUNTER — Other Ambulatory Visit: Payer: Self-pay

## 2023-05-06 ENCOUNTER — Telehealth: Payer: Self-pay

## 2023-05-06 ENCOUNTER — Telehealth: Payer: Self-pay | Admitting: Sports Medicine

## 2023-05-06 ENCOUNTER — Other Ambulatory Visit (HOSPITAL_COMMUNITY): Payer: Self-pay

## 2023-05-06 ENCOUNTER — Ambulatory Visit: Payer: Medicare Other | Admitting: Sports Medicine

## 2023-05-06 NOTE — Patient Outreach (Signed)
Christine Coordination   Follow Up Visit Note   05/04/2023 Name: [name] MRN: [id number] DOB: 28-Oct-1926  [name] is a 89 y.o. year old female who sees Christine Sheffield, MD for primary Christine. I spoke with  [name] facility coordinator, Christine by phone today.  What matters to the patients health and wellness today?  ADL's, Symptom Management, Housing    Goals Addressed             This Visit's Progress    LCSW-Obtain Supportive Resources   On track    Activities and task to complete in order to accomplish goals.   Keep all upcoming appointments discussed today Continue with compliance of taking medication prescribed by Doctor Implement healthy coping skills discussed to assist with management of symptoms Continue working with [name] Christine team to assist with goals identified F/up with [name] F/up with Dr. 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 Patient is expected to obtain second lease violation, placing her at significant risk of eviction]  Doctor Visits Discussed/Reviewed Doctor Visits Reviewed  Communication with --  [LCSW collaborated with Christine Coordinator at pt's independent living home, [name]]  Mental Health Interventions   Mental Health Discussed/Reviewed Mental Health Reviewed, Coping Strategies, Depression, Grief and Loss, Anxiety  [Pt's [place] for the remainder of the month (approx 2 weeks) LCSW will speak with pt about whether food/essentials will continue to be provided via delivery during this time]  Pharmacy Interventions   Pharmacy Dicussed/Reviewed Pharmacy Topics Reviewed, Medication Adherence  [Pt's medications have been updated and staff will encourage pt to take meds as prescribed on a routine basis to assist with symptom managment]       Follow up plan: Follow up call scheduled for 1-2 weeks    Encounter Outcome:  Patient Visit Completed   [name], MSW, LCSW [facility] Christine Management [facility] Health  [facility] [name] Phone [phone] 4:38 PM

## 2023-05-06 NOTE — Telephone Encounter (Signed)
[name] from [facility] called to say she did not receive the office visit notes for this patient last week when I faxed them over. Asked that I email them this time, which I did ([email or web address]).

## 2023-05-06 NOTE — Telephone Encounter (Signed)
Attempted to call patient to let her know that her Quetiapine Fumarate ER 50 mg was approved. Left message on voicemail for patient to return call when available

## 2023-05-06 NOTE — Patient Instructions (Signed)
Visit Information  Thank you for taking time to visit with me today. Please don't hesitate to contact me if I can be of assistance to you.   Following are the goals we discussed today:   Goals Addressed             This Visit's Progress    LCSW-Obtain Supportive Resources   On track    Activities and task to complete in order to accomplish goals.   Keep all upcoming appointments discussed today Continue with compliance of taking medication prescribed by Doctor Implement healthy coping skills discussed to assist with management of symptoms Continue working with [facility] care team to assist with goals identified F/up with Care Linx F/up with Dr. Dione Booze office regarding medication review          Please call the care guide team at [phone] if you need to cancel or reschedule your appointment.   If you are experiencing a Mental Health or Behavioral Health Crisis or need someone to talk to, please call the Suicide and Crisis Lifeline: 988 call 911   Patient verbalizes understanding of instructions and care plan provided today and agrees to view in MyChart. Active MyChart status and patient understanding of how to access instructions and care plan via MyChart confirmed with patient.     [name], MSW, LCSW [facility] Care Management [place]  [facility] [email or web address] Phone [phone] 4:38 PM'

## 2023-05-08 ENCOUNTER — Telehealth: Payer: Self-pay | Admitting: Licensed Clinical Social Worker

## 2023-05-08 NOTE — Patient Instructions (Signed)
Visit Information  Thank you for taking time to visit with me today. Please don't hesitate to contact me if I can be of assistance to you.   Following are the goals we discussed today:   Goals Addressed             This Visit's Progress    LCSW-Obtain Supportive Resources   On track    Activities and task to complete in order to accomplish goals.   Keep all upcoming appointments discussed today Continue with compliance of taking medication prescribed by Doctor Implement healthy coping skills discussed to assist with management of symptoms Continue working with [facility] care team to assist with goals identified F/up with Care Linx F/up with Dr. Dione Booze office regarding medication review         Please call the care guide team at [phone] if you need to cancel or reschedule your appointment.   If you are experiencing a Mental Health or Behavioral Health Crisis or need someone to talk to, please call the Suicide and Crisis Lifeline: 988 call 911   Patient verbalizes understanding of instructions and care plan provided today and agrees to view in MyChart. Active MyChart status and patient understanding of how to access instructions and care plan via MyChart confirmed with patient.     [name], MSW, LCSW [facility] Care Management [place]  [facility] [email or web address] Phone [phone] 5:20 AM

## 2023-05-08 NOTE — Patient Outreach (Signed)
Care Coordination   Follow Up Visit Note   05/07/2023 Name: [name] MRN: [id number] DOB: 1927-02-10  [name] is a 89 y.o. year old female who sees Venita Sheffield, MD for primary care. I spoke with  [name] coordinator, [name] by phone today.  What matters to the patients health and wellness today?  Housing and supportive resources    Goals Addressed             This Visit's Progress    LCSW-Obtain Supportive Resources   On track    Activities and task to complete in order to accomplish goals.   Keep all upcoming appointments discussed today Continue with compliance of taking medication prescribed by Doctor Implement healthy coping skills discussed to assist with management of symptoms Continue working with [facility] care team to assist with goals identified F/up with Care Linx F/up with Dr. Dione Booze office regarding medication review         SDOH assessments and interventions completed:  No     Care Coordination Interventions:  Yes, provided  Interventions Today    Flowsheet Row Most Recent Value  Chronic Disease   Chronic disease during today's visit Hypertension (HTN), Other  [Depression, Anxiety, Mild Cognitive Disorder]  General Interventions   General Interventions Discussed/Reviewed General Interventions Reviewed, [facility], Doctor Visits, Level of Care  Doctor Visits Discussed/Reviewed Doctor Visits Reviewed  Level of Care Assisted Living, Personal Care Services  Mental Health Interventions   Mental Health Discussed/Reviewed Mental Health Reviewed  Pharmacy Interventions   Pharmacy Dicussed/Reviewed Pharmacy Topics Reviewed, Medication Adherence  Safety Interventions   Safety Discussed/Reviewed Safety Reviewed       Follow up plan: Follow up call scheduled for 1-2 weeks    Encounter Outcome:  Patient Visit Completed   [name], MSW, LCSW [facility] Care Management [facility] Health  [facility] [email or web address] Phone [phone] 5:20 AM

## 2023-05-11 ENCOUNTER — Encounter: Payer: Self-pay | Admitting: Licensed Clinical Social Worker

## 2023-05-12 ENCOUNTER — Ambulatory Visit: Payer: Self-pay

## 2023-05-12 ENCOUNTER — Encounter: Payer: Self-pay | Admitting: Licensed Clinical Social Worker

## 2023-05-12 NOTE — Patient Outreach (Signed)
Care Coordination   Follow Up Visit Note   05/12/2023 Name: [name] MRN: [id number] DOB: 1926/08/30  [name] is a 89 y.o. year old female who sees Venita Sheffield, MD for primary care. I spoke with patient [name], [name] by phone today.  What matters to the patients health and wellness today?  To keep patient safe and without injury.     Goals Addressed             This Visit's Progress    To avoid having further falls   Not on track    Care Coordination Interventions: Assessed for falls since last encounter Assessed patients knowledge of fall risk prevention secondary to previously provided education Assessed working status of life alert bracelet and patient adherence     To get help with worsening depression   On track    Care Coordination Interventions: Completed outbound call with [name] patient ambassador (patient previously provided verbal consent to this RN to speak with [name] regarding PHI when needed) Evaluation of current treatment plan related to depression and patient's adherence to plan as established by provider Determined [name] is very concerned about the patient's worsening paranoia, depression and the inability to care for herself Discussed a PCA referral was placed, however on the day of the assessment, patient was alert, lucid and oriented, APS closed the case without interventions Determined patient's housing manager has issued her at least 2 lease violations, she will be evicted if she receives a third violation, discussed patient's son is aware Determined [name] attended patient's most recent PCP visit, an FL2 was completed by the provider, determined patient may be eligible for VA benefits due to her spouse served in the [employer]  Reviewed medications with [name] including patient's newly prescribed Seroquel, this medication has been added to patient's pill packaging system and patient has started this medication,  unfortunately, she has missed a couple of doses of her meds  Sent in basket message to Christine Lucks LCSW regarding determination of patient's VA benefits    Interventions Today    Flowsheet Row Most Recent Value  Chronic Disease   Chronic disease during today's visit Other  [dementia,  dementia]  General Interventions   General Interventions Discussed/Reviewed General Interventions Discussed, General Interventions Reviewed, Doctor Visits  Doctor Visits Discussed/Reviewed Doctor Visits Discussed, Doctor Visits Reviewed, PCP, Specialist  Communication with PCP/Specialists  Education Interventions   Education Provided Provided Education  Provided Verbal Education On Medication, When to see the doctor, Mental Health/Coping with Illness  Mental Health Interventions   Mental Health Discussed/Reviewed Mental Health Discussed, Mental Health Reviewed, Anxiety, Depression  Refer to Social Work for counseling regarding Anxiety/Coping, [name],  hallucinations]  Pharmacy Interventions   Pharmacy Dicussed/Reviewed Pharmacy Topics Discussed, Pharmacy Topics Reviewed, Medications and their functions, Medication Adherence  Safety Interventions   Safety Discussed/Reviewed Home Safety, Fall Risk, Safety Reviewed, Safety Discussed  Home Safety Assistive Devices          SDOH assessments and interventions completed:  No     Care Coordination Interventions:  Yes, provided   Follow up plan: Follow up call scheduled for 06/09/23 @11 :00 AM    Encounter Outcome:  Patient Visit Completed

## 2023-05-12 NOTE — Patient Instructions (Signed)
Visit Information  Thank you for taking time to visit with me today. Please don't hesitate to contact me if I can be of assistance to you.   Following are the goals we discussed today:   Goals Addressed             This Visit's Progress    To avoid having further falls   Not on track    Care Coordination Interventions: Assessed for falls since last encounter Assessed patients knowledge of fall risk prevention secondary to previously provided education Assessed working status of life alert bracelet and patient adherence        To get help with worsening depression   On track    Care Coordination Interventions: Completed outbound call with [name] patient ambassador (patient previously provided verbal consent to this RN to speak with [name] regarding PHI when needed) Evaluation of current treatment plan related to depression and patient's adherence to plan as established by provider Determined [name] is very concerned about the patient's worsening paranoia, depression and the inability to care for herself Discussed a PCA referral was placed, however on the day of the assessment, patient was alert, lucid and oriented, APS closed the case without interventions Determined patient's housing manager has issued her at least 2 lease violations, she will be evicted if she receives a third violation, discussed patient's son is aware Determined [name] attended patient's most recent PCP visit, an FL2 was completed by the provider, determined patient may be eligible for VA benefits due to her spouse served in the [employer]  Reviewed medications with [name] including patient's newly prescribed Seroquel, this medication has been added to patient's pill packaging system and patient has started this medication, unfortunately, she has missed a couple of doses of her meds  Sent in basket message to [name] LCSW regarding determination of patient's VA benefits        Our next  appointment is by telephone on 06/09/23 at 11:00 AM  Please call the care guide team at [phone] if you need to cancel or reschedule your appointment.   If you are experiencing a Mental Health or Behavioral Health Crisis or need someone to talk to, please call 1-800-273-TALK (toll free, 24 hour hotline)  Patient verbalizes understanding of instructions and care plan provided today and agrees to view in MyChart. Active MyChart status and patient understanding of how to access instructions and care plan via MyChart confirmed with patient.     Delsa Sale RN BSN CCM [place]  [facility], [facility] Nurse Care Coordinator  Direct Dial: [phone] Website: [email or web address]

## 2023-05-12 NOTE — Patient Outreach (Signed)
Care Coordination   Collaboration  Visit Note   05/11/2023 Name: [name] MRN: [id number] DOB: Apr 18, 1927  [name] is a 89 y.o. year old female who sees Venita Sheffield, MD for primary care. I spoke with  [name] by phone today.  What matters to the patients health and wellness today?  Personal Care Services and/or Levels of Care    SDOH assessments and interventions completed:  No     Care Coordination Interventions:  Yes, provided  Interventions Today    Flowsheet Row Most Recent Value  Chronic Disease   Chronic disease during today's visit Hypertension (HTN)  [MDD, GAD, Memory Concerns]  General Interventions   General Interventions Discussed/Reviewed Communication with, Level of Care, Community Resources  [employer] collaborated with BSW on supportive resources to assist patient with reaching identified goals. LCSW spoke  with Care Coordinator, Mearl Latin, and recieved supportive documents to determine eligibility for VA benefits]  Level of Care Adult Daycare, Personal Care Services       Follow up plan: Follow up call scheduled for 1-2 weeks    Encounter Outcome:  Patient Visit Completed   [name], MSW, LCSW [facility] Care Management [facility]  [facility] [email or web address] Phone [phone] 6:11 PM

## 2023-05-13 ENCOUNTER — Encounter: Payer: Self-pay | Admitting: Licensed Clinical Social Worker

## 2023-05-13 NOTE — Patient Outreach (Signed)
Care Coordination   Collaboration  Visit Note   05/12/2023 Name: [name] MRN: [id number] DOB: [birth year]  [name] is a 89 y.o. year old female who sees Venita Sheffield, MD for primary care. I  engaged with community resources  What matters to the patients health and wellness today?  Patient was not engaged during this encounter    Goals Addressed             This Visit's Progress    LCSW-Obtain Supportive Resources   On track    Activities and task to complete in order to accomplish goals.   Keep all upcoming appointments discussed today Continue with compliance of taking medication prescribed by Doctor Implement healthy coping skills discussed to assist with management of symptoms Continue working with [facility] care team to assist with goals identified F/up with Care Linx F/up with Dr. Dione Booze office regarding medication review         SDOH assessments and interventions completed:  No     Care Coordination Interventions:  Yes, provided  Interventions Today    Flowsheet Row Most Recent Value  General Interventions   General Interventions Discussed/Reviewed Community Resources  [employer] collaborated with [employer] and [facility] [employer] [phone] regarding pt's eligibility for [place] services. Patient was deemed ineligible due to having had divorced late husband, nullifying any benefits]       Follow up plan: Follow up call scheduled for 1 week    Encounter Outcome:  Patient Visit Completed   [name], MSW, LCSW [facility] Care Management [facility] Health  [facility] [email or web address] Phone [phone] 6:17 PM

## 2023-05-13 NOTE — Patient Instructions (Signed)
Visit Information  Thank you for taking time to visit with me today. Please don't hesitate to contact me if I can be of assistance to you.   Following are the goals we discussed today:   Goals Addressed             This Visit's Progress    LCSW-Obtain Supportive Resources   On track    Activities and task to complete in order to accomplish goals.   Keep all upcoming appointments discussed today Continue with compliance of taking medication prescribed by Doctor Implement healthy coping skills discussed to assist with management of symptoms Continue working with [facility] care team to assist with goals identified F/up with Care Linx F/up with Dr. Dione Booze office regarding medication review         Please call the care guide team at [phone] if you need to cancel or reschedule your appointment.   If you are experiencing a Mental Health or Behavioral Health Crisis or need someone to talk to, please call the Suicide and Crisis Lifeline: 988 call 911   The patient verbalized understanding of instructions, educational materials, and care plan provided today and DECLINED offer to receive copy of patient instructions, educational materials, and care plan.   [name], MSW, LCSW [facility] Care Management [place]  [facility] [email or web address] Phone [phone] 6:17 PM

## 2023-05-14 ENCOUNTER — Other Ambulatory Visit: Payer: Self-pay

## 2023-05-14 NOTE — Patient Outreach (Signed)
Care Coordination   Collaborative  Visit Note   05/13/2023 Name: CAMBRE RANN MRN: [id number] DOB: 07-29-1927  NOLIE KIMAK is a 89 y.o. year old female who sees Venita Sheffield, MD for primary care. I  engaged with RNCM  What matters to the patients health and wellness today?  Level of Care    Goals Addressed             This Visit's Progress    LCSW-Obtain Supportive Resources   On track    Activities and task to complete in order to accomplish goals.   Keep all upcoming appointments discussed today Continue with compliance of taking medication prescribed by Doctor Implement healthy coping skills discussed to assist with management of symptoms Continue working with [facility] care team to assist with goals identified F/up with Care Linx F/up with Dr. Dione Booze office regarding medication review         SDOH assessments and interventions completed:  No     Care Coordination Interventions:  Yes, provided  Interventions Today    Flowsheet Row Most Recent Value  Chronic Disease   Chronic disease during today's visit Other  [Mild cognitive disorder]  General Interventions   General Interventions Discussed/Reviewed Communication with  Communication with RN  [employer] collaborated with [facility] regarding pt's eligibility of benefits. Determined next plan of action to strengthen pt's support and promote safety in the home. LCSW will collaborate with housing coordinator 05/14/23]       Follow up plan:  LCSW will f/up with Mearl Latin on 05/14/23    Encounter Outcome:  Patient Visit Completed   [name], MSW, LCSW [facility] Care Management [facility] Health  [facility] [email or web address] Phone [phone] 3:29 PM

## 2023-05-18 ENCOUNTER — Encounter (HOSPITAL_COMMUNITY): Payer: Self-pay

## 2023-05-18 ENCOUNTER — Other Ambulatory Visit: Payer: Self-pay

## 2023-05-18 ENCOUNTER — Emergency Department (HOSPITAL_COMMUNITY)
Admission: EM | Admit: 2023-05-18 | Discharge: 2023-05-22 | Disposition: A | Discharge status: Skilled Nursing Facility | Payer: Medicare Other

## 2023-05-18 DIAGNOSIS — F22 Delusional disorders: Secondary | ICD-10-CM | POA: Diagnosis not present

## 2023-05-18 DIAGNOSIS — F29 Unspecified psychosis not due to a substance or known physiological condition: Secondary | ICD-10-CM | POA: Insufficient documentation

## 2023-05-18 DIAGNOSIS — G3184 Mild cognitive impairment, so stated: Secondary | ICD-10-CM | POA: Insufficient documentation

## 2023-05-18 DIAGNOSIS — F4321 Adjustment disorder with depressed mood: Secondary | ICD-10-CM

## 2023-05-18 DIAGNOSIS — R2689 Other abnormalities of gait and mobility: Secondary | ICD-10-CM | POA: Diagnosis not present

## 2023-05-18 DIAGNOSIS — F0390 Unspecified dementia without behavioral disturbance: Secondary | ICD-10-CM | POA: Insufficient documentation

## 2023-05-18 DIAGNOSIS — Z79899 Other long term (current) drug therapy: Secondary | ICD-10-CM | POA: Insufficient documentation

## 2023-05-18 DIAGNOSIS — Z602 Problems related to living alone: Secondary | ICD-10-CM | POA: Diagnosis not present

## 2023-05-18 LAB — COMPREHENSIVE METABOLIC PANEL
ALT: 16 U/L (ref 0–44)
AST: 18 U/L (ref 15–41)
Albumin: 3.6 g/dL (ref 3.5–5.0)
Alkaline Phosphatase: 50 U/L (ref 38–126)
Anion gap: 6 (ref 5–15)
BUN: 16 mg/dL (ref 8–23)
CO2: 27 mmol/L (ref 22–32)
Calcium: 9.5 mg/dL (ref 8.9–10.3)
Chloride: 106 mmol/L (ref 98–111)
Creatinine, Ser: 0.54 mg/dL (ref 0.44–1.00)
GFR, Estimated: >60 mL/min (ref >60)
Glucose, Bld: 142 mg/dL — ABNORMAL HIGH (ref 70–99)
Potassium: 3.6 mmol/L (ref 3.5–5.1)
Sodium: 139 mmol/L (ref 135–145)
Total Bilirubin: 0.7 mg/dL (ref 0.3–1.2)
Total Protein: 6.1 g/dL — ABNORMAL LOW (ref 6.5–8.1)

## 2023-05-18 LAB — URINALYSIS, ROUTINE W REFLEX MICROSCOPIC
Bilirubin Urine: NEGATIVE
Glucose, UA: NEGATIVE mg/dL
Hgb urine dipstick: NEGATIVE
Ketones, ur: NEGATIVE mg/dL
Leukocytes,Ua: NEGATIVE
Nitrite: NEGATIVE
Protein, ur: NEGATIVE mg/dL
Specific Gravity, Urine: 1.014 (ref 1.005–1.030)
pH: 6 (ref 5.0–8.0)

## 2023-05-18 LAB — CBC WITH DIFFERENTIAL/PLATELET
Abs Immature Granulocytes: 0.01 10*3/uL (ref 0.00–0.07)
Basophils Absolute: 0 10*3/uL (ref 0.0–0.1)
Basophils Relative: 1 %
Eosinophils Absolute: 0 10*3/uL (ref 0.0–0.5)
Eosinophils Relative: 1 %
HCT: 39 % (ref 36.0–46.0)
Hemoglobin: 12.6 g/dL (ref 12.0–15.0)
Immature Granulocytes: 0 %
Lymphocytes Relative: 34 %
Lymphs Abs: 1.5 10*3/uL (ref 0.7–4.0)
MCH: 29 pg (ref 26.0–34.0)
MCHC: 32.3 g/dL (ref 30.0–36.0)
MCV: 89.7 fL (ref 80.0–100.0)
Monocytes Absolute: 0.4 10*3/uL (ref 0.1–1.0)
Monocytes Relative: 9 %
Neutro Abs: 2.4 10*3/uL (ref 1.7–7.7)
Neutrophils Relative %: 55 %
Platelets: 174 10*3/uL (ref 150–400)
RBC: 4.35 MIL/uL (ref 3.87–5.11)
RDW: 13.6 % (ref 11.5–15.5)
WBC: 4.4 10*3/uL (ref 4.0–10.5)
nRBC: 0 % (ref 0.0–0.2)

## 2023-05-18 LAB — SALICYLATE LEVEL: Salicylate Lvl: <7 mg/dL — ABNORMAL LOW (ref 7.0–30.0)

## 2023-05-18 LAB — ACETAMINOPHEN LEVEL: Acetaminophen (Tylenol), Serum: <10 ug/mL — ABNORMAL LOW (ref 10–30)

## 2023-05-18 LAB — ETHANOL: Alcohol, Ethyl (B): <10 mg/dL (ref <10)

## 2023-05-18 MED ORDER — SERTRALINE HCL 50 MG PO TABS
50.0000 mg | ORAL_TABLET | Freq: Every day | ORAL | Status: DC
  Administered 2023-05-18: 50 mg via ORAL
  Filled 2023-05-18 (×2): qty 1

## 2023-05-18 MED ORDER — GABAPENTIN 300 MG PO CAPS
300.0000 mg | ORAL_CAPSULE | Freq: Two times a day (BID) | ORAL | Status: DC
  Administered 2023-05-18 – 2023-05-22 (×8): 300 mg via ORAL
  Filled 2023-05-18 (×8): qty 1

## 2023-05-18 MED ORDER — QUETIAPINE FUMARATE ER 50 MG PO TB24
50.0000 mg | ORAL_TABLET | Freq: Once | ORAL | Status: AC
  Administered 2023-05-18: 50 mg via ORAL
  Filled 2023-05-18: qty 1

## 2023-05-18 MED ORDER — NETARSUDIL-LATANOPROST 0.02-0.005 % OP SOLN
1.0000 [drp] | Freq: Every evening | OPHTHALMIC | Status: DC
  Administered 2023-05-21: 1 [drp] via OPHTHALMIC

## 2023-05-18 MED ORDER — LISINOPRIL 10 MG PO TABS
10.0000 mg | ORAL_TABLET | Freq: Every day | ORAL | Status: DC
  Administered 2023-05-19: 10 mg via ORAL
  Filled 2023-05-18: qty 1

## 2023-05-18 MED ORDER — DORZOLAMIDE HCL-TIMOLOL MAL 2-0.5 % OP SOLN
1.0000 [drp] | Freq: Two times a day (BID) | OPHTHALMIC | Status: DC
  Administered 2023-05-19 – 2023-05-22 (×6): 1 [drp] via OPHTHALMIC
  Filled 2023-05-18 (×3): qty 10

## 2023-05-18 MED ORDER — BRIMONIDINE TARTRATE 0.2 % OP SOLN
1.0000 [drp] | Freq: Two times a day (BID) | OPHTHALMIC | Status: DC
  Administered 2023-05-19 – 2023-05-22 (×3): 1 [drp] via OPHTHALMIC
  Filled 2023-05-18: qty 5

## 2023-05-18 NOTE — ED Notes (Signed)
Pt ambulated to bathroom with walker and some minimal assistance.
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
## 2023-05-18 NOTE — ED Notes (Signed)
Pt has been dressed out in purple scrubs and personal belongings were put in cabinet above nurses station.   Pt belongings included: Software engineer Life alert necklace Bag containing keys, tissues, wallet and some papers.  Pt has dentures and glasses by bedside.  Pt label was placed on bag.  Pt is calm and cooperative at this time.

## 2023-05-19 DIAGNOSIS — F4321 Adjustment disorder with depressed mood: Secondary | ICD-10-CM | POA: Diagnosis not present

## 2023-05-19 DIAGNOSIS — F29 Unspecified psychosis not due to a substance or known physiological condition: Secondary | ICD-10-CM

## 2023-05-19 DIAGNOSIS — G3184 Mild cognitive impairment, so stated: Secondary | ICD-10-CM

## 2023-05-19 MED ORDER — PANTOPRAZOLE SODIUM 40 MG PO TBEC
40.0000 mg | DELAYED_RELEASE_TABLET | Freq: Every day | ORAL | Status: DC
  Administered 2023-05-19 – 2023-05-22 (×4): 40 mg via ORAL
  Filled 2023-05-19 (×4): qty 1

## 2023-05-19 MED ORDER — QUETIAPINE FUMARATE ER 50 MG PO TB24
50.0000 mg | ORAL_TABLET | Freq: Every day | ORAL | Status: DC
  Filled 2023-05-19: qty 1

## 2023-05-19 MED ORDER — QUETIAPINE FUMARATE 25 MG PO TABS
25.0000 mg | ORAL_TABLET | Freq: Every day | ORAL | Status: DC
  Administered 2023-05-19 – 2023-05-21 (×3): 25 mg via ORAL
  Filled 2023-05-19 (×2): qty 1

## 2023-05-19 MED ORDER — LISINOPRIL 20 MG PO TABS
20.0000 mg | ORAL_TABLET | Freq: Every day | ORAL | Status: DC
  Administered 2023-05-20 – 2023-05-22 (×3): 20 mg via ORAL
  Filled 2023-05-19 (×2): qty 1
  Filled 2023-05-19: qty 2

## 2023-05-19 MED ORDER — SERTRALINE HCL 50 MG PO TABS
100.0000 mg | ORAL_TABLET | Freq: Every day | ORAL | Status: DC
  Administered 2023-05-19 – 2023-05-21 (×3): 100 mg via ORAL
  Filled 2023-05-19 (×3): qty 2

## 2023-05-19 NOTE — ED Notes (Signed)
Pt given dinner tray.

## 2023-05-19 NOTE — ED Notes (Signed)
Moved pt belongings to cabinet for [place] B bed.

## 2023-05-19 NOTE — Progress Notes (Signed)
Patients adult protective services worker Primitivo Gauze called for an update

## 2023-05-19 NOTE — Consult Note (Signed)
[facility] Consult Note  Patient Name: [name] MRN: [id number] DOB: [birth year] DATE OF Consult: 05/19/2023  PRIMARY PSYCHIATRIC DIAGNOSES  1.  Unspecified psychosis 2.  Adjustment disorder with depressed mood 3.  Rule out dementia  RECOMMENDATIONS  Medication recommendations: Continue Zoloft 50mg  daily for depression. Continue Seroquel XR 50mg  PO QHS to help with mood stabilization. Non-Medication/therapeutic recommendations: Consider medical/hospitalist consult to assess patient's condition and inability to care for herself. Communication: Treatment team members (and family members if applicable) who were involved in treatment/care discussions and planning, and with whom we spoke or engaged with via secure text/chat, include the following:  treatment team Blinda Leatherwood MD, Myra Gianotti)   Thank you for involving [name] in the care of this patient. If you have any additional questions or concerns, please call [phone] and ask for me or the provider on-call.  TELEPSYCHIATRY ATTESTATION & CONSENT  As the provider for this telehealth consult, I attest that I verified the patient's identity using two separate identifiers, introduced myself to the patient, provided my credentials, disclosed my location, and performed this encounter via a HIPAA-compliant, real-time, face-to-face, two-way, interactive audio and video platform and with the full consent and agreement of the patient (or guardian as applicable.)  Patient physical location: [facility]. Telehealth provider physical location: home office in state of [place].  Video start time: 0138 (Central Time) Video end time: 0154 (Central Time)  IDENTIFYING DATA  [name] is a 89 y.o. year-old female for whom a psychiatric consultation has been ordered by the primary provider. The patient was identified using two separate identifiers.  CHIEF COMPLAINT/REASON FOR CONSULT  "I am stressed from my neighbor"  HISTORY OF PRESENT ILLNESS  (HPI)  The patient is a 89 year old female who presents to the ER on an IVC initiated by her landlord due to patient having progressively worsening personal and mental health. She is reported to be noncompliant with her medications; that she has threatened to kill herself; that she is leaving the apartment in unsanitary conditions citing leaving soiled diapers laying around.  Patient's chief complaint centers on stress stemming from her neighbor's actions. She reports that her neighbor comes in her apartment and litters the place with dirty diapers, and she is being made to clean it up by her landlord. Patient reports that she has been trying to clean up the mess which has caused her to become very exhausted.  When asked how her neighbor can get into her apartment, she reports that the neighbor might have a key even though she reports she never gave the neighbor a key. When reminded that her locks were changed recently due to her complains, she then accused her landlord of giving the neighbor a key or that the landlord must have left her apartment unlocked when she went in her apartment to fix something though she denies requesting any maintenance to be done. Patient reports that she has not taken her medications in a couple weeks due to not being able to keep track of her medications. Patient reports that she is an only child and has lived alone for an extended period. She reports having 2 children, a son who lives in [place] and a daughter who passed away about 3 years ago. She denies any thoughts of self-harm or harming others. Her sleep quality is generally good, although she reported having to sleep in a wooden chair for two nights due to her neighbor taking her bed. Patient describes her appetite as low and notes her  physical stature as weighing 92 pounds and standing five feet tall. She shared personal history, noting her father's death when she was 25 years old and her marriage around the age of  31. Per chart review, Mini mental exam done on 04/29/23 was 26 which may indicate some mild cognitive impairment.  PAST PSYCHIATRIC HISTORY  Denies history of inpatient mental health admission. She denies history of suicide attempt or self-harming behavior.   PAST MEDICAL HISTORY  Past Medical History:  Diagnosis Date   Arthritis    Osteoarthritis   Depression    GERD (gastroesophageal reflux disease)    Hyperlipidemia    Osteopenia      HOME MEDICATIONS  Facility Ordered Medications  Medication   [COMPLETED] QUEtiapine (SEROQUEL XR) 24 hr tablet 50 mg   brimonidine (ALPHAGAN) 0.2 % ophthalmic solution 1 drop   dorzolamide-timolol (COSOPT) 2-0.5 % ophthalmic solution 1 drop   gabapentin (NEURONTIN) capsule 300 mg   lisinopril (ZESTRIL) tablet 10 mg   Netarsudil-Latanoprost 0.02-0.005 % SOLN 1 drop   sertraline (ZOLOFT) tablet 50 mg   PTA Medications  Medication Sig   diclofenac sodium (VOLTAREN) 1 % GEL Apply 2 g topically 4 (four) times daily.   BIOTIN PO Take 1 capsule by mouth daily.   Netarsudil-Latanoprost (ROCKLATAN) 0.02-0.005 % SOLN Place 1 drop into the right eye every evening.   brimonidine (ALPHAGAN) 0.2 % ophthalmic solution Place 1 drop into the right eye 2 (two) times daily.   dorzolamide-timolol (COSOPT) 2-0.5 % ophthalmic solution Place 1 drop into the right eye 2 (two) times daily.   ADVIL 200 MG CAPS Take 200 mg by mouth every 8 (eight) hours as needed (for pain or headaches).   prednisoLONE acetate (PRED FORTE) 1 % ophthalmic suspension Place 1 drop into the left eye 2 (two) times daily.   sertraline (ZOLOFT) 100 MG tablet Take 1 tablet (100 mg total) by mouth at bedtime.   gabapentin (NEURONTIN) 300 MG capsule Take 1 capsule (300 mg total) by mouth 2 (two) times daily.   omeprazole (PRILOSEC) 20 MG capsule Take 1 capsule (20 mg total) by mouth daily.   QUEtiapine (SEROQUEL XR) 50 MG TB24 24 hr tablet Take 1 tablet (50 mg total) by mouth daily.   lisinopril  (ZESTRIL) 10 MG tablet Take 1 tablet (10 mg total) by mouth daily.     ALLERGIES  Allergies  Allergen Reactions   Penicillins Rash    SOCIAL & SUBSTANCE USE HISTORY  Social History   Socioeconomic History   Marital status: Divorced    Spouse name: Not on file   Number of children: 2   Years of education: Not on file   Highest education level: Not on file  Occupational History   Not on file  Tobacco Use   Smoking status: Former   Smokeless tobacco: Never   Tobacco comments:    Quit at age 86   Vaping Use   Vaping status: Never Used  Substance and Sexual Activity   Alcohol use: Not Currently    Comment: Occasionally    Drug use: No   Sexual activity: Not Currently  Other Topics Concern   Not on file  Social History Narrative   Not on file   Social Determinants of Health   Financial Resource Strain: Low Risk  (03/29/2018)   Overall Financial Resource Strain (CARDIA)    Difficulty of Paying Living Expenses: Not hard at all  Food Insecurity: No Food Insecurity (01/02/2023)   Hunger Vital Sign  Worried About Programme researcher, broadcasting/film/video in the Last Year: Never true    Ran Out of Food in the Last Year: Never true  Transportation Needs: No Transportation Needs (01/02/2023)   PRAPARE - Administrator, Civil Service (Medical): No    Lack of Transportation (Non-Medical): No  Physical Activity: Inactive (03/29/2018)   Exercise Vital Sign    Days of Exercise per Week: 0 days    Minutes of Exercise per Session: 0 min  Stress: No Stress Concern Present (03/29/2018)   [employer] of Occupational Health - Occupational Stress Questionnaire    Feeling of Stress : Only a little  Social Connections: Moderately Isolated (03/29/2018)   Social Connection and Isolation Panel [NHANES]    Frequency of Communication with Friends and Family: More than three times a week    Frequency of Social Gatherings with Friends and Family: More than three times a week    Attends Religious  Services: Never    Database administrator or Organizations: No    Attends Banker Meetings: Never    Marital Status: Widowed   Social History   Tobacco Use  Smoking Status Former  Smokeless Tobacco Never  Tobacco Comments   Quit at age 72    Social History   Substance and Sexual Activity  Alcohol Use Not Currently   Comment: Occasionally    Social History   Substance and Sexual Activity  Drug Use No    Additional pertinent information .  FAMILY HISTORY  History reviewed. No pertinent family history. Family Psychiatric History (if known):  unknown  MENTAL STATUS EXAM (MSE)  Presentation  General Appearance: Appropriate for Environment Eye Contact:Poor Speech:Clear and Coherent Speech Volume:Normal Handedness:Right  Mood and Affect  Mood:Depressed Affect:Congruent  Thought Process  Thought Processes:Coherent Descriptions of Associations:Circumstantial  Orientation:Partial  Thought Content:Delusions  History of Schizophrenia/Schizoaffective disorder:No data recorded Duration of Psychotic Symptoms:No data recorded Hallucinations:No data recorded Ideas of Reference:Paranoia  Suicidal Thoughts:Suicidal Thoughts: No  Homicidal Thoughts:Homicidal Thoughts: No   Sensorium  Memory:No data recorded Judgment:Fair Insight:Fair  Executive Functions  Concentration:Fair Attention Span:Fair Recall:Fair Fund of Knowledge:Fair Language:Good  Psychomotor Activity  Psychomotor Activity:Psychomotor Activity: Normal  Assets  Assets:Communication Skills  Sleep  Sleep:Sleep: -- ("I was sleeping really good until the neighbor came and took the bed away because our apartment is connected")   VITALS  Blood pressure (!) 142/71, pulse (!) 58, temperature 98.1 F (36.7 C), temperature source Oral, resp. rate 18, height 5' (1.524 m), weight 41.7 kg, SpO2 97%.  LABS  Admission on 05/18/2023  Component Date Value Ref Range Status   WBC 05/18/2023 4.4   4.0 - 10.5 K/uL Final   RBC 05/18/2023 4.35  3.87 - 5.11 MIL/uL Final   Hemoglobin 05/18/2023 12.6  12.0 - 15.0 g/dL Final   HCT 40/05/2724 39.0  36.0 - 46.0 % Final   MCV 05/18/2023 89.7  80.0 - 100.0 fL Final   MCH 05/18/2023 29.0  26.0 - 34.0 pg Final   MCHC 05/18/2023 32.3  30.0 - 36.0 g/dL Final   RDW 36/64/4034 13.6  11.5 - 15.5 % Final   Platelets 05/18/2023 174  150 - 400 K/uL Final   nRBC 05/18/2023 0.0  0.0 - 0.2 % Final   Neutrophils Relative % 05/18/2023 55  % Final   Neutro Abs 05/18/2023 2.4  1.7 - 7.7 K/uL Final   Lymphocytes Relative 05/18/2023 34  % Final   Lymphs Abs 05/18/2023 1.5  0.7 - 4.0  K/uL Final   Monocytes Relative 05/18/2023 9  % Final   Monocytes Absolute 05/18/2023 0.4  0.1 - 1.0 K/uL Final   Eosinophils Relative 05/18/2023 1  % Final   Eosinophils Absolute 05/18/2023 0.0  0.0 - 0.5 K/uL Final   Basophils Relative 05/18/2023 1  % Final   Basophils Absolute 05/18/2023 0.0  0.0 - 0.1 K/uL Final   Immature Granulocytes 05/18/2023 0  % Final   Abs Immature Granulocytes 05/18/2023 0.01  0.00 - 0.07 K/uL Final   Performed at [facility], [address], [place], [place] [address]   Sodium 05/18/2023 139  135 - 145 mmol/L Final   Potassium 05/18/2023 3.6  3.5 - 5.1 mmol/L Final   Chloride 05/18/2023 106  98 - 111 mmol/L Final   CO2 05/18/2023 27  22 - 32 mmol/L Final   Glucose, Bld 05/18/2023 142 (H)  70 - 99 mg/dL Final   Glucose reference range applies only to samples taken after fasting for at least 8 hours.   BUN 05/18/2023 16  8 - 23 mg/dL Final   Creatinine, Ser 05/18/2023 0.54  0.44 - 1.00 mg/dL Final   Calcium 25/95/6387 9.5  8.9 - 10.3 mg/dL Final   Total Protein 56/43/3295 6.1 (L)  6.5 - 8.1 g/dL Final   Albumin 18/84/1660 3.6  3.5 - 5.0 g/dL Final   AST 63/08/6008 18  15 - 41 U/L Final   ALT 05/18/2023 16  0 - 44 U/L Final   Alkaline Phosphatase 05/18/2023 50  38 - 126 U/L Final   Total Bilirubin 05/18/2023 0.7  0.3 - 1.2  mg/dL Final   GFR, Estimated 05/18/2023 >60  >60 mL/min Final   Comment: (NOTE) Calculated using the CKD-EPI Creatinine Equation (2021)    Anion gap 05/18/2023 6  5 - 15 Final   Performed at [facility], [address] [place], [place] [address]   Color, Urine 05/18/2023 YELLOW  YELLOW Final   APPearance 05/18/2023 HAZY (A)  CLEAR Final   Specific Gravity, Urine 05/18/2023 1.014  1.005 - 1.030 Final   pH 05/18/2023 6.0  5.0 - 8.0 Final   Glucose, UA 05/18/2023 NEGATIVE  NEGATIVE mg/dL Final   Hgb urine dipstick 05/18/2023 NEGATIVE  NEGATIVE Final   Bilirubin Urine 05/18/2023 NEGATIVE  NEGATIVE Final   Ketones, ur 05/18/2023 NEGATIVE  NEGATIVE mg/dL Final   Protein, ur 57/32/2025 NEGATIVE  NEGATIVE mg/dL Final   Nitrite 42/70/6237 NEGATIVE  NEGATIVE Final   Leukocytes,Ua 05/18/2023 NEGATIVE  NEGATIVE Final   Performed at [facility], [address], [place], [place] [address]   Salicylate Lvl 05/18/2023 <7.0 (L)  7.0 - 30.0 mg/dL Final   Performed at [facility], [address] [place], [place] [address]   Acetaminophen (Tylenol), Serum 05/18/2023 <10 (L)  10 - 30 ug/mL Final   Comment: (NOTE) Therapeutic concentrations vary significantly. A range of 10-30 ug/mL  may be an effective concentration for many patients. However, some  are best treated at concentrations outside of this range. Acetaminophen concentrations >150 ug/mL at 4 hours after ingestion  and >50 ug/mL at 12 hours after ingestion are often associated with  toxic reactions.  Performed at [facility], [address] [place], [place] [address]    Alcohol, Ethyl (B) 05/18/2023 <10  <10 mg/dL Final   Comment: (NOTE) Lowest detectable limit for serum alcohol is 10 mg/dL.  For medical purposes only. Performed at [facility],  [address] [place], [place] [address]     PSYCHIATRIC REVIEW OF SYSTEMS (ROS)  -  Patient is alert and oriented to person and place but unsure of the date. - Demonstrates intact long-term memory with recall of personal historical details. - Exhibits confusion regarding recent events and security of living situation. - Denies current intent to harm self or others. - Speech is coherent and goal-directed. - Oriented to current president Duane Boston). - Demonstrates some paranoia and delusions regarding neighbor entering apartment and littering it with dirty diapers and taking her bed.  Additional findings:      Musculoskeletal: No abnormal movements observed      Gait & Station: Laying/Sitting      Pain Screening: Denies      Nutrition & Dental Concerns: Decrease in food intake and/or loss of appetite  RISK FORMULATION/ASSESSMENT  Is the patient experiencing any suicidal or homicidal ideations: No       Explain if yes:  Protective factors considered for safety management: access to appropriate clinical services.  Risk factors/concerns considered for safety management:  Depression Age over 35 Isolation  Is there a safety management plan with the patient and treatment team to minimize risk factors and promote protective factors: Yes           Explain: Consider medical/hospitalist consult to assess patient's  condition and inability to care for herself Is crisis care placement or psychiatric hospitalization recommended: No     Based on my current evaluation and risk assessment, patient is determined at this time to be at:  Moderate Risk  *RISK ASSESSMENT Risk assessment is a dynamic process; it is possible that this patient's condition, and risk level, may change. This should be re-evaluated and managed over time as appropriate. Please re-consult psychiatric consult services if additional assistance is needed in terms of risk assessment and management. If your team decides to discharge this patient, please advise the patient how to best access emergency psychiatric  services, or to call 911, if their condition worsens or they feel unsafe in any way.   Norval Morton, NP Telepsychiatry Consult Services

## 2023-05-19 NOTE — ED Notes (Signed)
Pt in Room 1 for TTS consult

## 2023-05-19 NOTE — ED Provider Notes (Addendum)
Emergency Medicine Observation Re-evaluation Note  [name] is a 89 y.o. female, seen on rounds today.  Pt initially presented to the ED for complaints of Psychiatric Evaluation Currently, the patient is resting comfortably.  Physical Exam  BP (!) 142/71   Pulse (!) 58   Temp 98.1 F (36.7 C) (Oral)   Resp 18   Ht 1.524 m (5')   Wt 41.7 kg   SpO2 97%   BMI 17.97 kg/m  Physical Exam  ED Course / MDM  EKG:   I have reviewed the labs performed to date as well as medications administered while in observation.  Recent changes in the last 24 hours include   Patient seen by telepsychiatry this morning.  Felt that patient would need more for medical evaluation.  Patient's medical evaluation here is negative.  She is medically clear at this point.  Spoke with [name] from [facility].  This is an independent living facility.  Her phone number is [phone].  She can provide further insight into the patient's issues where she lives.  Patient has had increased paranoia as well as visual loose Nations.  Patient may need general psych admission.  According to [name], patient understands that she needs more help and cannot live independently.  This would likely at some point require involvement from our [facility] team.  I have spoken with our behavioral health team and they will reevaluate the patient.  Plan  Current plan is for as above.    Lorre Nick, MD 05/19/23 0981    Lorre Nick, MD 05/19/23 [phone]

## 2023-05-19 NOTE — Progress Notes (Signed)
CSW received call from APS caseworker, Primitivo Gauze [phone]) who reported pt will need LTC and does not qualify for Medicaid. She states that as of yesterday, pt does not qualify and will have to spend down. Shaune Pascal states pt's son lives out of state and doesn't have the best relationship with his mom. CSW did inform Shaune Pascal that pt is being followed by TTS at this time but TOC will follow for disposition.

## 2023-05-19 NOTE — Consult Note (Signed)
[facility] ED ASSESSMENT   Reason for Consult:  Psychiatry evaluation Referring Physician:  ER Physician Patient Identification: [name] MRN:  [id number] ED Chief Complaint: Psychosis [facility])  Diagnosis:  Principal Problem:   Psychosis (HCC) Active Problems:   Adjustment disorder with depressed mood   Mild neurocognitive disorder   ED Assessment Time Calculation: Start Time: 1302 Stop Time: 1334 Total Time in Minutes (Assessment Completion): 32   Subjective:   [name] is a 89 y.o. female patient admitted with no known previous mental illness was brought in by Ambulance.from an independent living facility for  not eating, Paranoia that neighbors are coming into her apartment to steal and for not being able to care for herself.  HPI:  Patient, 89 years old caucasian female who lives in an independent housing facility was seen by provider this morning.  She is alert and oriented to her name, where she is and how she got here.  She was able to tell provider her son's name and where he lives.  She is emaciated and disheveled.  She reports she is stressed out and depressed and admitted that occasionally she makes suicidal statement that she does not want to live anymore.  Patient states that she lives alone and cooks her own food but failed short of saying how often she cooks or eats. Collateral information from [facility], Director of the facility who states that she visited patient and found used  Depends all over the apartment.  Patient is basically living in squalor-urine smelling all over.  She states that for five days patient have not eaten .  On several occasions patient had voiced not wanting to live anymore.  She is of the opinion that patient is not capable of taking care of herself anymore.  She contacted patient's Son Lunden Stieber who agreed for patient to come to the ER.   Provider spoke to [employer] as well he verified that his mother is now a danger to self-not eating, living in  squalor and has become very paranoid. Patient need treatment and prescription for antidepressant is in and Quetiapine for Psychosis is in as well. We will seek placement at a Geropsychiatry unit.    Past Psychiatric History: Nnone  Risk to Self or Others: Is the patient at risk to self? Yes Has the patient been a risk to self in the past 6 months? Yes Has the patient been a risk to self within the distant past? Yes Is the patient a risk to others? No Has the patient been a risk to others in the past 6 months? No Has the patient been a risk to others within the distant past? No  [place] Scale:  Flowsheet Row ED from 05/18/2023 in [facility] Emergency Department at [facility] ED from 01/22/2023 in [facility] Emergency Department at [facility] ED from 11/22/2022 in [facility] Emergency Department at [facility]  C-SSRS RISK CATEGORY No Risk No Risk No Risk       AIMS:  , , ,  ,   ASAM:    Substance Abuse:     Past Medical History:  Past Medical History:  Diagnosis Date   Arthritis    Osteoarthritis   Depression    GERD (gastroesophageal reflux disease)    Hyperlipidemia    Osteopenia     Past Surgical History:  Procedure Laterality Date   APPENDECTOMY  1936   CERVICAL FUSION  2004   COLON SURGERY  06/12/2010  Colonoscopy   EYE SURGERY Left 2024   FINGER SURGERY  1989   MELANOMA EXCISION  11/16/2017   forehead   SPINE SURGERY  2009   Back Fusion   TONSILLECTOMY  1941   Family History: History reviewed. No pertinent family history. Family Psychiatric  History: UNKNOWN Social History:  Social History   Substance and Sexual Activity  Alcohol Use Not Currently   Comment: Occasionally      Social History   Substance and Sexual Activity  Drug Use No    Social History   Socioeconomic History   Marital status: Divorced    Spouse name: Not on file   Number of children: 2   Years of education: Not on file   Highest education level:  Not on file  Occupational History   Not on file  Tobacco Use   Smoking status: Former   Smokeless tobacco: Never   Tobacco comments:    Quit at age 59   Vaping Use   Vaping status: Never Used  Substance and Sexual Activity   Alcohol use: Not Currently    Comment: Occasionally    Drug use: No   Sexual activity: Not Currently  Other Topics Concern   Not on file  Social History Narrative   Not on file   Social Determinants of Health   Financial Resource Strain: Low Risk  (03/29/2018)   Overall Financial Resource Strain (CARDIA)    Difficulty of Paying Living Expenses: Not hard at all  Food Insecurity: No Food Insecurity (01/02/2023)   Hunger Vital Sign    Worried About Running Out of Food in the Last Year: Never true    Ran Out of Food in the Last Year: Never true  Transportation Needs: No Transportation Needs (01/02/2023)   PRAPARE - Administrator, Civil Service (Medical): No    Lack of Transportation (Non-Medical): No  Physical Activity: Inactive (03/29/2018)   Exercise Vital Sign    Days of Exercise per Week: 0 days    Minutes of Exercise per Session: 0 min  Stress: No Stress Concern Present (03/29/2018)   [employer] of Occupational Health - Occupational Stress Questionnaire    Feeling of Stress : Only a little  Social Connections: Moderately Isolated (03/29/2018)   Social Connection and Isolation Panel [NHANES]    Frequency of Communication with Friends and Family: More than three times a week    Frequency of Social Gatherings with Friends and Family: More than three times a week    Attends Religious Services: Never    Database administrator or Organizations: No    Attends Banker Meetings: Never    Marital Status: Widowed   Additional Social History:    Allergies:   Allergies  Allergen Reactions   Penicillins Rash    Labs:  Results for orders placed or performed during the hospital encounter of 05/18/23 (from the past 48 hour(s))   CBC with Differential     Status: None   Collection Time: 05/18/23  7:39 PM  Result Value Ref Range   WBC 4.4 4.0 - 10.5 K/uL   RBC 4.35 3.87 - 5.11 MIL/uL   Hemoglobin 12.6 12.0 - 15.0 g/dL   HCT 56.2 13.0 - 86.5 %   MCV 89.7 80.0 - 100.0 fL   MCH 29.0 26.0 - 34.0 pg   MCHC 32.3 30.0 - 36.0 g/dL   RDW 78.4 69.6 - 29.5 %   Platelets 174 150 - 400 K/uL  nRBC 0.0 0.0 - 0.2 %   Neutrophils Relative % 55 %   Neutro Abs 2.4 1.7 - 7.7 K/uL   Lymphocytes Relative 34 %   Lymphs Abs 1.5 0.7 - 4.0 K/uL   Monocytes Relative 9 %   Monocytes Absolute 0.4 0.1 - 1.0 K/uL   Eosinophils Relative 1 %   Eosinophils Absolute 0.0 0.0 - 0.5 K/uL   Basophils Relative 1 %   Basophils Absolute 0.0 0.0 - 0.1 K/uL   Immature Granulocytes 0 %   Abs Immature Granulocytes 0.01 0.00 - 0.07 K/uL    Comment: Performed at [facility], [address], [place], [place] [address]  Comprehensive metabolic panel     Status: Abnormal   Collection Time: 05/18/23  7:39 PM  Result Value Ref Range   Sodium 139 135 - 145 mmol/L   Potassium 3.6 3.5 - 5.1 mmol/L   Chloride 106 98 - 111 mmol/L   CO2 27 22 - 32 mmol/L   Glucose, Bld 142 (H) 70 - 99 mg/dL    Comment: Glucose reference range applies only to samples taken after fasting for at least 8 hours.   BUN 16 8 - 23 mg/dL   Creatinine, Ser 6.04 0.44 - 1.00 mg/dL   Calcium 9.5 8.9 - 54.0 mg/dL   Total Protein 6.1 (L) 6.5 - 8.1 g/dL   Albumin 3.6 3.5 - 5.0 g/dL   AST 18 15 - 41 U/L   ALT 16 0 - 44 U/L   Alkaline Phosphatase 50 38 - 126 U/L   Total Bilirubin 0.7 0.3 - 1.2 mg/dL   GFR, Estimated >98 >11 mL/min    Comment: (NOTE) Calculated using the CKD-EPI Creatinine Equation (2021)    Anion gap 6 5 - 15    Comment: Performed at [facility], [address], [place], [place] [address]  Salicylate level     Status: Abnormal   Collection Time: 05/18/23  7:39 PM  Result Value Ref Range   Salicylate Lvl <7.0 (L) 7.0 - 30.0  mg/dL    Comment: Performed at [facility] [address] [place], [place] [address]  Acetaminophen level     Status: Abnormal   Collection Time: 05/18/23  7:39 PM  Result Value Ref Range   Acetaminophen (Tylenol), Serum <10 (L) 10 - 30 ug/mL    Comment: (NOTE) Therapeutic concentrations vary significantly. A range of 10-30 ug/mL  may be an effective concentration for many patients. However, some  are best treated at concentrations outside of this range. Acetaminophen concentrations >150 ug/mL at 4 hours after ingestion  and >50 ug/mL at 12 hours after ingestion are often associated with  toxic reactions.  Performed at [facility], [address], [place], [place] [address]   Ethanol     Status: None   Collection Time: 05/18/23  7:39 PM  Result Value Ref Range   Alcohol, Ethyl (B) <10 <10 mg/dL    Comment: (NOTE) Lowest detectable limit for serum alcohol is 10 mg/dL.  For medical purposes only. Performed at [facility], [address] [place], [place] [address]   Urinalysis, Routine w reflex microscopic -Urine, Clean Catch     Status: Abnormal   Collection Time: 05/18/23  8:14 PM  Result Value Ref Range   Color, Urine YELLOW YELLOW   APPearance HAZY (A) CLEAR   Specific Gravity, Urine 1.014 1.005 - 1.030   pH 6.0 5.0 - 8.0   Glucose,  UA NEGATIVE NEGATIVE mg/dL   Hgb urine dipstick NEGATIVE NEGATIVE   Bilirubin Urine NEGATIVE NEGATIVE   Ketones, ur NEGATIVE NEGATIVE mg/dL   Protein, ur NEGATIVE NEGATIVE mg/dL   Nitrite NEGATIVE NEGATIVE   Leukocytes,Ua NEGATIVE NEGATIVE    Comment: Performed at [facility], [address] [place], [place] [address]    Current Facility-Administered Medications  Medication Dose Route Frequency Provider Last Rate Last Admin   brimonidine (ALPHAGAN) 0.2 % ophthalmic solution 1 drop  1 drop Right Eye BID Elpidio Anis, PA-C   1 drop at 05/19/23 1210    dorzolamide-timolol (COSOPT) 2-0.5 % ophthalmic solution 1 drop  1 drop Right Eye BID Elpidio Anis, PA-C   1 drop at 05/19/23 1210   gabapentin (NEURONTIN) capsule 300 mg  300 mg Oral BID Elpidio Anis, PA-C   300 mg at 05/19/23 1141   lisinopril (ZESTRIL) tablet 10 mg  10 mg Oral Daily Elpidio Anis, PA-C   10 mg at 05/19/23 1141   Netarsudil-Latanoprost 0.02-0.005 % SOLN 1 drop  1 drop Right Eye QPM Upstill, Shari, PA-C       pantoprazole (PROTONIX) EC tablet 40 mg  40 mg Oral Daily Lorre Nick, MD   40 mg at 05/19/23 1141   QUEtiapine (SEROQUEL XR) 24 hr tablet 50 mg  50 mg Oral QHS Lorre Nick, MD       sertraline (ZOLOFT) tablet 50 mg  50 mg Oral QHS Elpidio Anis, PA-C   50 mg at 05/18/23 2312   Current Outpatient Medications  Medication Sig Dispense Refill   dorzolamide-timolol (COSOPT) 2-0.5 % ophthalmic solution Place 1 drop into the right eye 2 (two) times daily. 30 mL 2   gabapentin (NEURONTIN) 300 MG capsule Take 1 capsule (300 mg total) by mouth 2 (two) times daily. (Patient taking differently: Take 300-600 mg by mouth See admin instructions. Take 300 mg by mouth in the morning and 300-600 mg by mouth at bedtime.) 180 capsule 1   lisinopril (ZESTRIL) 20 MG tablet Take 20 mg by mouth daily.     omeprazole (PRILOSEC) 20 MG capsule Take 1 capsule (20 mg total) by mouth daily. 90 capsule 1   ADVIL 200 MG CAPS Take 200 mg by mouth every 8 (eight) hours as needed (for pain or headaches).     BIOTIN PO Take 1 capsule by mouth daily.     brimonidine (ALPHAGAN) 0.2 % ophthalmic solution Place 1 drop into the right eye 2 (two) times daily. 20 mL 2   diclofenac sodium (VOLTAREN) 1 % GEL Apply 2 g topically 4 (four) times daily. 350 g 2   lisinopril (ZESTRIL) 10 MG tablet Take 1 tablet (10 mg total) by mouth daily. (Patient not taking: Reported on 05/19/2023) 90 tablet 3   Netarsudil-Latanoprost (ROCKLATAN) 0.02-0.005 % SOLN Place 1 drop into the right eye every evening. (Patient not  taking: Reported on 05/19/2023) 5 mL 2   prednisoLONE acetate (PRED FORTE) 1 % ophthalmic suspension Place 1 drop into the left eye 2 (two) times daily.     QUEtiapine (SEROQUEL XR) 50 MG TB24 24 hr tablet Take 1 tablet (50 mg total) by mouth daily. 90 tablet 2   sertraline (ZOLOFT) 100 MG tablet Take 1 tablet (100 mg total) by mouth at bedtime. 90 tablet 3    Musculoskeletal: Strength & Muscle Tone: decreased and Utilizes walker Gait & Station:  utilizes walker Patient leans: Front   Psychiatric Specialty Exam: Presentation  General Appearance:  Casual; Disheveled  Eye Contact: Good  Speech: Clear and Coherent; Normal Rate  Speech Volume: Normal  Handedness: Right   Mood and Affect  Mood: Depressed; Anxious  Affect: Congruent; Depressed   Thought Process  Thought Processes: Coherent  Descriptions of Associations:Loose  Orientation:Partial  Thought Content:Illogical  History of Schizophrenia/Schizoaffective disorder:No  Duration of Psychotic Symptoms:N/A  Hallucinations:Hallucinations: None  Ideas of Reference:None  Suicidal Thoughts:Suicidal Thoughts: No  Homicidal Thoughts:Homicidal Thoughts: No   Sensorium  Memory: Immediate Fair; Recent Fair; Remote Poor  Judgment: Poor  Insight: Poor   Executive Functions  Concentration: Fair  Attention Span: Fair  Recall: Fair  Fund of Knowledge: Poor  Language: Good   Psychomotor Activity  Psychomotor Activity: Psychomotor Activity: Normal   Assets  Assets: Communication Skills    Sleep  Sleep: Sleep: Fair   Physical Exam: Physical Exam Vitals and nursing note reviewed.  Constitutional:      Comments: Disheveled , Emaciated  HENT:     Nose: Nose normal.  Cardiovascular:     Rate and Rhythm: Normal rate and regular rhythm.  Pulmonary:     Effort: Pulmonary effort is normal.  Musculoskeletal:     Cervical back: Normal range of motion.  Skin:    General: Skin is  dry.  Neurological:     Mental Status: She is alert and oriented to person, place, and time.  Psychiatric:        Attention and Perception: Attention normal. She perceives auditory hallucinations.        Mood and Affect: Mood is depressed.        Speech: Speech normal.        Behavior: Behavior normal. Behavior is cooperative.        Thought Content: Thought content is paranoid. Thought content includes suicidal ideation.        Cognition and Memory: Cognition is impaired. Memory is impaired.    Review of Systems  Constitutional: Negative.   HENT: Negative.    Eyes: Negative.   Respiratory: Negative.    Cardiovascular: Negative.   Gastrointestinal: Negative.   Genitourinary: Negative.   Musculoskeletal:  Positive for falls.  Skin: Negative.   Neurological: Negative.   Endo/Heme/Allergies: Negative.   Psychiatric/Behavioral:  Positive for depression and hallucinations.    Blood pressure (!) 163/68, pulse 62, temperature (!) 96.8 F (36 C), temperature source Axillary, resp. rate 16, height 5' (1.524 m), weight 41.7 kg, SpO2 100%. Body mass index is 17.97 kg/m.  Medical Decision Making: Patient meets criteria for inpatient hospitalization.  Patient is now on Seroquel for Psychosis and Zoloft for depression .  Plan is to seek inpatient Psychiatry hospitalization for safety and stabilization.  Her son already started looking for alternate living facility for her after treatment.  Problem 1: Mild Cognitive disorder  Problem 2: Adjustment disorder with depressed mood  Problem 3: Psychosis.  Disposition:  Admit, seek placement.  Earney Navy, NP-PMHNP-BC 05/19/2023 2:15 PM

## 2023-05-20 ENCOUNTER — Encounter: Payer: Self-pay | Admitting: Licensed Clinical Social Worker

## 2023-05-20 ENCOUNTER — Telehealth: Payer: Self-pay | Admitting: Licensed Clinical Social Worker

## 2023-05-20 DIAGNOSIS — F29 Unspecified psychosis not due to a substance or known physiological condition: Secondary | ICD-10-CM | POA: Diagnosis not present

## 2023-05-20 NOTE — ED Provider Notes (Signed)
I spoke with the Child psychotherapist.  Patient will require short-term nursing home stay.  Anticipate she will stay in 30 days or less for rehab and strengthening.  The plan afterwards is to return home.   Charlynne Pander, MD 05/20/23 [phone]

## 2023-05-20 NOTE — Progress Notes (Signed)
LCSW Progress Note  [id number]   [name]  05/20/2023  9:39 AM  Description:   Inpatient Psychiatric Referral  Patient was recommended inpatient per Phebe Colla, NP. There are no available beds at [facility] or [facility], per [facility] Malva Limes, RN. Patient was referred to the following out of network facilities:   Destination  Service Provider Address Phone Fax  [facility]  [address] [place] [address] [phone]  [facility]  [address] [place] [address] [phone]  [facility]  [address] [place] [address] [phone]  [facility]  [address] [place], [place] [address] [phone]  [facility]  [address] [place], [place] [address] [phone]  [facility]  [address] [place] [address] [phone]  [facility]  [address] [place], [place] [address] [phone]  [facility]  [address] [place] [address] [phone]  [facility]  [address], [place] [address] [phone]  [facility]  [address], [place] [address] [phone]  [facility]  [place] [phone]  [facility]  [address] [place], [place] [phone]  [facility]  [address] [place] [address] [phone]  [facility]  [address], [place] [address] [phone]  [facility] EFAX  [place] [phone]    Situation ongoing, CSW to continue  following and update chart as more information becomes available.      Cathie Beams, [place]  05/20/2023 9:39 AM

## 2023-05-20 NOTE — Patient Instructions (Signed)
Visit Information  Thank you for taking time to visit with me today. Please don't hesitate to contact me if I can be of assistance to you.   Following are the goals we discussed today:   Goals Addressed             This Visit's Progress    LCSW-Obtain Supportive Resources   On track    Activities and task to complete in order to accomplish goals.   Keep all upcoming appointments discussed today Continue with compliance of taking medication prescribed by Doctor Implement healthy coping skills discussed to assist with management of symptoms Continue working with [facility] care team to assist with goals identified F/up with [facility] F/up with Dr. Dione Booze office regarding medication review         Please call the care guide team at [phone] if you need to cancel or reschedule your appointment.   If you are experiencing a Mental Health or Behavioral Health Crisis or need someone to talk to, please call the Suicide and Crisis Lifeline: 988 call 911   Patient verbalizes understanding of instructions and care plan provided today and agrees to view in MyChart. Active MyChart status and patient understanding of how to access instructions and care plan via MyChart confirmed with patient.     [name], MSW, LCSW [facility] Care Management [place]  [facility] [email or web address] Phone [phone] 3:37 PM

## 2023-05-20 NOTE — Progress Notes (Addendum)
CSW spoke with Camilla at this time CSW has updated APS worker that patient is [facility] clear and was seen by PT. AT this time CSW has tried to obtain PASRR. PASRR has went to level 2. CSW has sent over the documents for [place] to review TOC will continue to follow the patient and will update when possible.

## 2023-05-20 NOTE — Evaluation (Signed)
Physical Therapy Evaluation Patient Details Name: [name] MRN: [id number] DOB: 1926/08/20 Today's Date: 05/20/2023  History of Present Illness  89 y.o. female brought in to ED for evaluation after being IVC'ed. Per the IVC paperwork patient thinks that her neighbor is coming into her house and trashing the place. Patient reports to this nurse that she is stressed due to this happening. Patient reports not eating and not taking medications.  PMH: OA, depression, GERD, hyperlipidemia, osteopenia.  Clinical Impression  Pt admitted with above diagnosis. Pt reports she walks with a rollator at baseline, she has had 4 falls in the past 6 months. Pt required min assist for bed mobility and for transfers. She ambulated 200' with RW without loss of balance. Higher level of care recommended as pt is not able to care for herself at home and is at risk for further falls. She does not have 24* assistance available at home. Pt currently with functional limitations due to the deficits listed below (see PT Problem List). Pt will benefit from acute skilled PT to increase their independence and safety with mobility to allow discharge.           If plan is discharge home, recommend the following: A little help with walking and/or transfers;A little help with bathing/dressing/bathroom;Assistance with cooking/housework;Assist for transportation;Help with stairs or ramp for entrance;Direct supervision/assist for medications management   Can travel by private vehicle   Yes    Equipment Recommendations None recommended by PT  Recommendations for Other Services       Functional Status Assessment Patient has had a recent decline in their functional status and demonstrates the ability to make significant improvements in function in a reasonable and predictable amount of time.     Precautions / Restrictions Precautions Precautions: Fall Precaution Comments: pt reports h/o 4 falls in past 6  months Restrictions Weight Bearing Restrictions: No      Mobility  Bed Mobility Overal bed mobility: Needs Assistance Bed Mobility: Supine to Sit, Sit to Supine     Supine to sit: Min assist, Used rails Sit to supine: Min assist   General bed mobility comments: assist to raise trunk, assist for BLEs into bed    Transfers Overall transfer level: Needs assistance Equipment used: Rolling walker (2 wheels) Transfers: Sit to/from Stand Sit to Stand: Min assist           General transfer comment: min A to power up and for balance 2* posterior bias    Ambulation/Gait Ambulation/Gait assistance: Contact guard assist Gait Distance (Feet): 150 Feet Assistive device: Rolling walker (2 wheels) Gait Pattern/deviations: Step-through pattern, Decreased stride length, Trunk flexed Gait velocity: WFL     General Gait Details: steady, no loss of balance  Stairs            Wheelchair Mobility     Tilt Bed    Modified Rankin (Stroke Patients Only)       Balance Overall balance assessment: History of Falls, Mild deficits observed, not formally tested                                           Pertinent Vitals/Pain Pain Assessment Pain Assessment: No/denies pain    Home Living Family/patient expects to be discharged to:: Private residence Living Arrangements: Alone   Type of Home: Independent living facility Home Access: Level entry;Elevator  Home Equipment: Rollator (4 wheels);Tub bench Additional Comments: son lives in [place], sometimes friends stop by, "I've outlived everybody".    Prior Function               Mobility Comments: walks with rollator, 4 falls in past 6 months ADLs Comments: independent bathing/dressing; doesn't drive, groceries are delivered, insurance company provides driver to/from appointments     Extremity/Trunk Assessment   Upper Extremity Assessment Upper Extremity Assessment: Overall WFL for  tasks assessed    Lower Extremity Assessment Lower Extremity Assessment: Overall WFL for tasks assessed    Cervical / Trunk Assessment Cervical / Trunk Assessment: Kyphotic  Communication   Communication Communication: No apparent difficulties  Cognition Arousal: Alert Behavior During Therapy: WFL for tasks assessed/performed Overall Cognitive Status: Within Functional Limits for tasks assessed                                 General Comments: follows commands;  oriented to self,  month/year and location, follows 1 step commands        General Comments      Exercises     Assessment/Plan    PT Assessment Patient needs continued PT services  PT Problem List Decreased activity tolerance;Decreased balance;Decreased mobility       PT Treatment Interventions DME instruction;Gait training;Therapeutic exercise;Balance training;Therapeutic activities    PT Goals (Current goals can be found in the Care Plan section)  Acute Rehab PT Goals Patient Stated Goal: to get more help PT Goal Formulation: With patient Time For Goal Achievement: 06/03/23 Potential to Achieve Goals: Good    Frequency Min 1X/week     Co-evaluation               AM-PAC PT "6 Clicks" Mobility  Outcome Measure Help needed turning from your back to your side while in a flat bed without using bedrails?: A Little Help needed moving from lying on your back to sitting on the side of a flat bed without using bedrails?: A Little Help needed moving to and from a bed to a chair (including a wheelchair)?: A Little Help needed standing up from a chair using your arms (e.g., wheelchair or bedside chair)?: A Little Help needed to walk in hospital room?: A Little Help needed climbing 3-5 steps with a railing? : A Lot 6 Click Score: 17    End of Session Equipment Utilized During Treatment: Gait belt Activity Tolerance: Patient tolerated treatment well Patient left: in bed;with call bell/phone  within reach;with nursing/sitter in room Nurse Communication: Mobility status PT Visit Diagnosis: Unsteadiness on feet (R26.81);History of falling (Z91.81);Repeated falls (R29.6)    Time: 7829-5621 PT Time Calculation (min) (ACUTE ONLY): 22 min   Charges:   PT Evaluation $PT Eval Moderate Complexity: 1 Mod   PT General Charges $$ ACUTE PT VISIT: 1 Visit         Tamala Ser PT 05/20/2023  Acute Rehabilitation Services  Office [phone]

## 2023-05-20 NOTE — Social Work (Cosign Needed)
RE: [name]  Date of Birth: 1927-03-22 Date: 05/20/2023   To Whom It May Concern: Please be advised that the above-named patient will require a short-term nursing home stay - anticipated 30 days or less for rehabilitation and strengthening.  The plan is for return home.

## 2023-05-20 NOTE — Progress Notes (Signed)
[facility] Psych ED Progress Note  05/20/2023 10:27 AM [name]  MRN:  [id number]   Subjective:   [name], 89 y.o., female patient seen face to face by this provider, consulted with Dr. Lucianne Muss; and chart reviewed on 05/20/23.  On evaluation [name] reports she is depressed.   She does say she has made suicidal comments in the past. She struggles to care for herself in her current environment. Patient is tolerating her medications.  Sitter staff reports the patient has been up and showered and that the patient has not presented with any bizarre behaviors.   During evaluation [name] is laying in a stretcher in no acute distress.  She is alert, oriented x 3, calm, cooperative and attentive.  Her mood is depressed with congruent affect. She has normal speech, and behavior.  Objectively there is no evidence of psychosis/mania or delusional thinking.  Patient is able to converse coherently, goal directed thoughts, no distractibility, or pre-occupation.  She also denies suicidal/self-harm/homicidal ideation, psychosis, and paranoia.  Patient answered questions appropriately.    Patient is in need of a higher level of care.  She needs assistance taking her medications properly, with preparing her meals and her hygiene.  Patient would be best served with SNF placement.  Patient is psychiatrically cleared.    Principal Problem: Psychosis (HCC) Diagnosis:  Principal Problem:   Psychosis (HCC) Active Problems:   Adjustment disorder with depressed mood   Mild neurocognitive disorder   ED Assessment Time Calculation: Start Time: 0800 Stop Time: 0900 Total Time in Minutes (Assessment Completion): 60   Past Psychiatric History: None noted  [place] Scale:  Flowsheet Row ED from 05/18/2023 in [facility] Emergency Department [facility] ED from 01/22/2023 in [facility] Emergency Department at [facility] ED from 11/22/2022 in [facility] Department at [facility]  C-SSRS RISK CATEGORY No Risk No Risk No Risk       Past Medical History:  Past Medical History:  Diagnosis Date   Arthritis    Osteoarthritis   Depression    GERD (gastroesophageal reflux disease)    Hyperlipidemia    Osteopenia     Past Surgical History:  Procedure Laterality Date   APPENDECTOMY  1936   CERVICAL FUSION  2004   COLON SURGERY  06/12/2010   Colonoscopy   EYE SURGERY Left 2024   FINGER SURGERY  1989   MELANOMA EXCISION  11/16/2017   forehead   SPINE SURGERY  2009   Back Fusion   TONSILLECTOMY  1941   Family History: History reviewed. No pertinent family history. Family Psychiatric  History: None noted Social History:  Social History   Substance and Sexual Activity  Alcohol Use Not Currently   Comment: Occasionally      Social History   Substance and Sexual Activity  Drug Use No    Social History   Socioeconomic History   Marital status: Divorced    Spouse name: Not on file   Number of children: 2   Years of education: Not on file   Highest education level: Not on file  Occupational History   Not on file  Tobacco Use   Smoking status: Former   Smokeless tobacco: Never   Tobacco comments:    Quit at age 55   Vaping Use   Vaping status: Never Used  Substance and Sexual Activity   Alcohol use: Not Currently    Comment: Occasionally  Drug use: No   Sexual activity: Not Currently  Other Topics Concern   Not on file  Social History Narrative   Not on file   Social Determinants of Health   Financial Resource Strain: Low Risk  (03/29/2018)   Overall Financial Resource Strain (CARDIA)    Difficulty of Paying Living Expenses: Not hard at all  Food Insecurity: No Food Insecurity (01/02/2023)   Hunger Vital Sign    Worried About Running Out of Food in the Last Year: Never true    Ran Out of Food in the Last Year: Never true  Transportation Needs: No Transportation Needs (01/02/2023)   PRAPARE - Scientist, research (physical sciences) (Medical): No    Lack of Transportation (Non-Medical): No  Physical Activity: Inactive (03/29/2018)   Exercise Vital Sign    Days of Exercise per Week: 0 days    Minutes of Exercise per Session: 0 min  Stress: No Stress Concern Present (03/29/2018)   [employer] of Occupational Health - Occupational Stress Questionnaire    Feeling of Stress : Only a little  Social Connections: Moderately Isolated (03/29/2018)   Social Connection and Isolation Panel [NHANES]    Frequency of Communication with Friends and Family: More than three times a week    Frequency of Social Gatherings with Friends and Family: More than three times a week    Attends Religious Services: Never    Database administrator or Organizations: No    Attends Banker Meetings: Never    Marital Status: Widowed    Sleep: Fair  Appetite:  Fair  Current Medications: Current Facility-Administered Medications  Medication Dose Route Frequency Provider Last Rate Last Admin   brimonidine (ALPHAGAN) 0.2 % ophthalmic solution 1 drop  1 drop Right Eye BID Elpidio Anis, PA-C   1 drop at 05/19/23 1210   dorzolamide-timolol (COSOPT) 2-0.5 % ophthalmic solution 1 drop  1 drop Right Eye BID Elpidio Anis, PA-C   1 drop at 05/19/23 1210   gabapentin (NEURONTIN) capsule 300 mg  300 mg Oral BID Elpidio Anis, PA-C   300 mg at 05/20/23 0919   lisinopril (ZESTRIL) tablet 20 mg  20 mg Oral Daily Lorre Nick, MD   20 mg at 05/20/23 0918   Netarsudil-Latanoprost 0.02-0.005 % SOLN 1 drop  1 drop Right Eye QPM Upstill, Shari, PA-C       pantoprazole (PROTONIX) EC tablet 40 mg  40 mg Oral Daily Lorre Nick, MD   40 mg at 05/20/23 0919   QUEtiapine (SEROQUEL) tablet 25 mg  25 mg Oral QHS Onuoha, Josephine C, NP   25 mg at 05/19/23 2155   sertraline (ZOLOFT) tablet 100 mg  100 mg Oral Lorenda Hatchet, MD   100 mg at 05/19/23 2153   Current Outpatient Medications  Medication Sig Dispense Refill    dorzolamide-timolol (COSOPT) 2-0.5 % ophthalmic solution Place 1 drop into the right eye 2 (two) times daily. 30 mL 2   gabapentin (NEURONTIN) 300 MG capsule Take 1 capsule (300 mg total) by mouth 2 (two) times daily. (Patient taking differently: Take 300-600 mg by mouth See admin instructions. Take 300 mg by mouth in the morning and 300-600 mg by mouth at bedtime.) 180 capsule 1   lisinopril (ZESTRIL) 20 MG tablet Take 20 mg by mouth daily.     omeprazole (PRILOSEC) 20 MG capsule Take 1 capsule (20 mg total) by mouth daily. 90 capsule 1   ADVIL 200 MG CAPS Take 200  mg by mouth every 8 (eight) hours as needed (for pain or headaches).     BIOTIN PO Take 1 capsule by mouth daily.     brimonidine (ALPHAGAN) 0.2 % ophthalmic solution Place 1 drop into the right eye 2 (two) times daily. 20 mL 2   diclofenac sodium (VOLTAREN) 1 % GEL Apply 2 g topically 4 (four) times daily. 350 g 2   lisinopril (ZESTRIL) 10 MG tablet Take 1 tablet (10 mg total) by mouth daily. (Patient not taking: Reported on 05/19/2023) 90 tablet 3   Netarsudil-Latanoprost (ROCKLATAN) 0.02-0.005 % SOLN Place 1 drop into the right eye every evening. (Patient not taking: Reported on 05/19/2023) 5 mL 2   prednisoLONE acetate (PRED FORTE) 1 % ophthalmic suspension Place 1 drop into the left eye 2 (two) times daily.     QUEtiapine (SEROQUEL XR) 50 MG TB24 24 hr tablet Take 1 tablet (50 mg total) by mouth daily. 90 tablet 2   sertraline (ZOLOFT) 100 MG tablet Take 1 tablet (100 mg total) by mouth at bedtime. 90 tablet 3    Lab Results:  Results for orders placed or performed during the hospital encounter of 05/18/23 (from the past 48 hour(s))  CBC with Differential     Status: None   Collection Time: 05/18/23  7:39 PM  Result Value Ref Range   WBC 4.4 4.0 - 10.5 K/uL   RBC 4.35 3.87 - 5.11 MIL/uL   Hemoglobin 12.6 12.0 - 15.0 g/dL   HCT 09.8 11.9 - 14.7 %   MCV 89.7 80.0 - 100.0 fL   MCH 29.0 26.0 - 34.0 pg   MCHC 32.3 30.0 - 36.0 g/dL    RDW 82.9 56.2 - 13.0 %   Platelets 174 150 - 400 K/uL   nRBC 0.0 0.0 - 0.2 %   Neutrophils Relative % 55 %   Neutro Abs 2.4 1.7 - 7.7 K/uL   Lymphocytes Relative 34 %   Lymphs Abs 1.5 0.7 - 4.0 K/uL   Monocytes Relative 9 %   Monocytes Absolute 0.4 0.1 - 1.0 K/uL   Eosinophils Relative 1 %   Eosinophils Absolute 0.0 0.0 - 0.5 K/uL   Basophils Relative 1 %   Basophils Absolute 0.0 0.0 - 0.1 K/uL   Immature Granulocytes 0 %   Abs Immature Granulocytes 0.01 0.00 - 0.07 K/uL    Comment: Performed at [facility], [address] [place], [place] [address]  Comprehensive metabolic panel     Status: Abnormal   Collection Time: 05/18/23  7:39 PM  Result Value Ref Range   Sodium 139 135 - 145 mmol/L   Potassium 3.6 3.5 - 5.1 mmol/L   Chloride 106 98 - 111 mmol/L   CO2 27 22 - 32 mmol/L   Glucose, Bld 142 (H) 70 - 99 mg/dL    Comment: Glucose reference range applies only to samples taken after fasting for at least 8 hours.   BUN 16 8 - 23 mg/dL   Creatinine, Ser 4.69 0.44 - 1.00 mg/dL   Calcium 9.5 8.9 - 62.9 mg/dL   Total Protein 6.1 (L) 6.5 - 8.1 g/dL   Albumin 3.6 3.5 - 5.0 g/dL   AST 18 15 - 41 U/L   ALT 16 0 - 44 U/L   Alkaline Phosphatase 50 38 - 126 U/L   Total Bilirubin 0.7 0.3 - 1.2 mg/dL   GFR, Estimated >52 >84 mL/min    Comment: (NOTE) Calculated using the CKD-EPI Creatinine Equation (2021)  Anion gap 6 5 - 15    Comment: Performed at [facility], [address] [place], [place] [address]  Salicylate level     Status: Abnormal   Collection Time: 05/18/23  7:39 PM  Result Value Ref Range   Salicylate Lvl <7.0 (L) 7.0 - 30.0 mg/dL    Comment: Performed at [facility], [address] [place], [place] [address]  Acetaminophen level     Status: Abnormal   Collection Time: 05/18/23  7:39 PM  Result Value Ref Range   Acetaminophen (Tylenol), Serum <10 (L) 10 - 30 ug/mL    Comment: (NOTE) Therapeutic  concentrations vary significantly. A range of 10-30 ug/mL  may be an effective concentration for many patients. However, some  are best treated at concentrations outside of this range. Acetaminophen concentrations >150 ug/mL at 4 hours after ingestion  and >50 ug/mL at 12 hours after ingestion are often associated with  toxic reactions.  Performed at [facility], [address] [place], [place] [address]   Ethanol     Status: None   Collection Time: 05/18/23  7:39 PM  Result Value Ref Range   Alcohol, Ethyl (B) <10 <10 mg/dL    Comment: (NOTE) Lowest detectable limit for serum alcohol is 10 mg/dL.  For medical purposes only. Performed at [facility], [address], [place], [place] [address]   Urinalysis, Routine w reflex microscopic -Urine, Clean Catch     Status: Abnormal   Collection Time: 05/18/23  8:14 PM  Result Value Ref Range   Color, Urine YELLOW YELLOW   APPearance HAZY (A) CLEAR   Specific Gravity, Urine 1.014 1.005 - 1.030   pH 6.0 5.0 - 8.0   Glucose, UA NEGATIVE NEGATIVE mg/dL   Hgb urine dipstick NEGATIVE NEGATIVE   Bilirubin Urine NEGATIVE NEGATIVE   Ketones, ur NEGATIVE NEGATIVE mg/dL   Protein, ur NEGATIVE NEGATIVE mg/dL   Nitrite NEGATIVE NEGATIVE   Leukocytes,Ua NEGATIVE NEGATIVE    Comment: Performed at [facility], [address] [place], [place] [address]    Blood Alcohol level:  Lab Results  Component Value Date   [facility] <10 05/18/2023    Musculoskeletal: Strength & Muscle Tone: within normal limits Gait & Station: normal Patient leans: Front and Uses walker  Psychiatric Specialty Exam:  Presentation  General Appearance:  Disheveled  Eye Contact: Good  Speech: Clear and Coherent  Speech Volume: Normal  Handedness: Right   Mood and Affect  Mood: Depressed  Affect: Congruent   Thought Process  Thought Processes: Coherent  Descriptions of  Associations:Intact  Orientation:Full (Time, Place and Person)  Thought Content:WDL  History of Schizophrenia/Schizoaffective disorder:No  Duration of Psychotic Symptoms:N/A  Hallucinations:Hallucinations: None  Ideas of Reference:None  Suicidal Thoughts:Suicidal Thoughts: No  Homicidal Thoughts:Homicidal Thoughts: No   Sensorium  Memory: Immediate Good; Recent Fair; Remote Fair  Judgment: Poor  Insight: Poor   Executive Functions  Concentration: Fair  Attention Span: Fair  Recall: Fair  Fund of Knowledge: Fair  Language: Good   Psychomotor Activity  Psychomotor Activity: Psychomotor Activity: Normal   Assets  Assets: Communication Skills; Leisure Time   Sleep  Sleep: Sleep: Fair Number of Hours of Sleep: 6    Physical Exam: Physical Exam Vitals and nursing note reviewed.  Eyes:     Pupils: Pupils are equal, round, and reactive to light.  Pulmonary:     Effort: Pulmonary effort is normal.  Skin:  General: Skin is dry.  Neurological:     Mental Status: She is alert and oriented to person, place, and time.    Review of Systems  Psychiatric/Behavioral:  Positive for depression.   All other systems reviewed and are negative.  Blood pressure 118/71, pulse 74, temperature 97.7 F (36.5 C), temperature source Axillary, resp. rate 16, height 5' (1.524 m), weight 41.7 kg, SpO2 94%. Body mass index is 17.97 kg/m.   Medical Decision Making: Patient case reviewed and discussed with Dr Lucianne Muss.  Patient denies suicidal ideation and is not a risk to herself or others.  Patient does not meet criteria for inpatient psychiatric hospitalization. Patient is psychiatrically cleared.   Problem 1: Psychosis - Continue medications  Seroquel 25mg  PO Q HS  Zoloft 100mg  PO Q HS - Recommend consider placement in a SNF     Thomes Lolling, NP 05/20/2023, 10:27 AM

## 2023-05-20 NOTE — Patient Outreach (Signed)
Care Coordination   Follow Up Visit Note   05/19/2023 Name: [name] MRN: [id number] DOB: [birth year]  [name] is a 89 y.o. year old female who sees Venita Sheffield, MD for primary care. I spoke with  [name] coordinator, [name], by phone today.  What matters to the patients health and wellness today?  Symptom Management and Level of Care    Goals Addressed             This Visit's Progress    LCSW-Obtain Supportive Resources   On track    Activities and task to complete in order to accomplish goals.   Keep all upcoming appointments discussed today Continue with compliance of taking medication prescribed by Doctor Implement healthy coping skills discussed to assist with management of symptoms Continue working with [facility] care team to assist with goals identified F/up with [facility] F/up with Dr. Dione Booze office regarding medication review         SDOH assessments and interventions completed:  No     Care Coordination Interventions:  Yes, provided  Interventions Today    Flowsheet Row Most Recent Value  Chronic Disease   Chronic disease during today's visit Other  [Mild Cognitive Disorder]  General Interventions   General Interventions Discussed/Reviewed General Interventions Reviewed, Communication with  Communication with Social Work  [LCSW reached out to ED CSW at [employer] via [employer]. Offered assistance, as needed.]  Mental Health Interventions   Mental Health Discussed/Reviewed Mental Health Reviewed, Crisis  [Pt's coordinator reports that office manager IVC'd patient due to ongoing behavioral concerns and endangerment to self. They have contacted APS about pt's current status. Pt's son is back in [place] and was made aware]     ' Follow up plan: Follow up call scheduled for 1-2 weeks    Encounter Outcome:  Patient Visit Completed   [name], MSW, LCSW [facility] Psychiatric Care Management [facility]  [facility] [email or web address] Phone [phone] 3:36 PM

## 2023-05-20 NOTE — NC FL2 (Addendum)
[place] MEDICAID FL2 LEVEL OF CARE FORM     IDENTIFICATION  Patient Name: [name] Birthdate: 28-Jun-1927 Sex: female Admission Date (Current Location): 05/18/2023  [facility] and [place] Number:  Producer, television/film/video and Address:  [facility],  [address] [place], [place] [address]      Provider Number: [id number]  Attending Physician Name and Address:   Chaney Malling Hsienta  [address]   Relative Name and Phone Number:  Beckie Salts APS worker [phone]    Current Level of Care: Hospital Recommended Level of Care: Skilled Nursing Facility Prior Approval Number:    Date Approved/Denied: 05/20/23 PASRR Number: PASRR Pending  Discharge Plan: SNF    Current Diagnoses: Patient Active Problem List   Diagnosis Date Noted   Psychosis (HCC) 05/19/2023   Adjustment disorder with depressed mood 05/19/2023   Mild neurocognitive disorder 05/19/2023   HLD (hyperlipidemia) 04/29/2023   GERD without esophagitis 03/08/2021   Chronic idiopathic constipation 03/08/2021   Moderate episode of recurrent major depressive disorder (HCC) 08/15/2019   Generalized anxiety disorder 08/15/2019   Recurrent major depressive disorder, in partial remission (HCC) 03/24/2019   Generalized osteoarthritis of multiple sites 03/24/2019   Need for influenza vaccination 04/01/2018   Malignant melanoma of left temple (HCC) 11/26/2017   Chronic venous insufficiency 07/26/2015   Urge incontinence of urine 07/26/2015   Glaucoma 07/26/2015   Postmenopausal estrogen deficiency 07/26/2015   Primary open angle glaucoma 01/02/2014   Hyperglycemia 11/18/2012   Hypertriglyceridemia, essential 11/18/2012   Essential hypertension, benign 11/18/2012   Peripheral neuropathy 11/18/2012    Orientation RESPIRATION BLADDER Height & Weight     Self  Normal Continent Weight: 92 lb (41.7 kg) Height:  5' (152.4 cm)  BEHAVIORAL SYMPTOMS/MOOD NEUROLOGICAL BOWEL NUTRITION STATUS       Continent Diet  AMBULATORY STATUS COMMUNICATION OF NEEDS Skin   Limited Assist Verbally Normal                       Personal Care Assistance Level of Assistance  Bathing, Feeding, Dressing Bathing Assistance: Limited assistance Feeding assistance: Independent Dressing Assistance: Limited assistance     Functional Limitations Info  Sight, Hearing, Speech Sight Info: Adequate Hearing Info: Adequate Speech Info: Adequate    SPECIAL CARE FACTORS FREQUENCY  PT (By licensed PT), OT (By licensed OT)     PT Frequency: X5 OT Frequency: X5            Contractures Contractures Info: Not present    Additional Factors Info  Code Status, Allergies Code Status Info: FULL Allergies Info: Penicillins           Current Medications (05/20/2023):  This is the current hospital active medication list Current Facility-Administered Medications  Medication Dose Route Frequency Provider Last Rate Last Admin   brimonidine (ALPHAGAN) 0.2 % ophthalmic solution 1 drop  1 drop Right Eye BID Elpidio Anis, PA-C   1 drop at 05/19/23 1210   dorzolamide-timolol (COSOPT) 2-0.5 % ophthalmic solution 1 drop  1 drop Right Eye BID Elpidio Anis, PA-C   1 drop at 05/19/23 1210   gabapentin (NEURONTIN) capsule 300 mg  300 mg Oral BID Elpidio Anis, PA-C   300 mg at 05/20/23 0919   lisinopril (ZESTRIL) tablet 20 mg  20 mg Oral Daily Lorre Nick, MD   20 mg at 05/20/23 0918   Netarsudil-Latanoprost 0.02-0.005 % SOLN 1 drop  1 drop Right Eye QPM Elpidio Anis, PA-C  pantoprazole (PROTONIX) EC tablet 40 mg  40 mg Oral Daily Lorre Nick, MD   40 mg at 05/20/23 0919   QUEtiapine (SEROQUEL) tablet 25 mg  25 mg Oral QHS Onuoha, Josephine C, NP   25 mg at 05/19/23 2155   sertraline (ZOLOFT) tablet 100 mg  100 mg Oral Lorenda Hatchet, MD   100 mg at 05/19/23 2153   Current Outpatient Medications  Medication Sig Dispense Refill   dorzolamide-timolol (COSOPT) 2-0.5 % ophthalmic solution Place  1 drop into the right eye 2 (two) times daily. 30 mL 2   gabapentin (NEURONTIN) 300 MG capsule Take 1 capsule (300 mg total) by mouth 2 (two) times daily. (Patient taking differently: Take 300-600 mg by mouth See admin instructions. Take 300 mg by mouth in the morning and 300-600 mg by mouth at bedtime.) 180 capsule 1   lisinopril (ZESTRIL) 20 MG tablet Take 20 mg by mouth daily.     omeprazole (PRILOSEC) 20 MG capsule Take 1 capsule (20 mg total) by mouth daily. 90 capsule 1   ADVIL 200 MG CAPS Take 200 mg by mouth every 8 (eight) hours as needed (for pain or headaches).     BIOTIN PO Take 1 capsule by mouth daily.     brimonidine (ALPHAGAN) 0.2 % ophthalmic solution Place 1 drop into the right eye 2 (two) times daily. 20 mL 2   diclofenac sodium (VOLTAREN) 1 % GEL Apply 2 g topically 4 (four) times daily. 350 g 2   lisinopril (ZESTRIL) 10 MG tablet Take 1 tablet (10 mg total) by mouth daily. (Patient not taking: Reported on 05/19/2023) 90 tablet 3   Netarsudil-Latanoprost (ROCKLATAN) 0.02-0.005 % SOLN Place 1 drop into the right eye every evening. (Patient not taking: Reported on 05/19/2023) 5 mL 2   prednisoLONE acetate (PRED FORTE) 1 % ophthalmic suspension Place 1 drop into the left eye 2 (two) times daily.     QUEtiapine (SEROQUEL XR) 50 MG TB24 24 hr tablet Take 1 tablet (50 mg total) by mouth daily. 90 tablet 2   sertraline (ZOLOFT) 100 MG tablet Take 1 tablet (100 mg total) by mouth at bedtime. 90 tablet 3     Discharge Medications: Please see discharge summary for a list of discharge medications.  Relevant Imaging Results:  Relevant Lab Results:   Additional Information SSN# [id number]  Georgie Chard, LCSW

## 2023-05-20 NOTE — Progress Notes (Signed)
Awaiting PT eval.  

## 2023-05-20 NOTE — ED Provider Notes (Signed)
Emergency Medicine Observation Re-evaluation Note  [name] is a 89 y.o. female, seen on rounds today.  Pt initially presented to the ED for complaints of Psychiatric Evaluation Currently, the patient is awaiting psych placement.  Physical Exam  BP 118/71 (BP Location: Left Arm)   Pulse 74   Temp 97.7 F (36.5 C) (Axillary)   Resp 16   Ht 5' (1.524 m)   Wt 41.7 kg   SpO2 94%   BMI 17.97 kg/m  Physical Exam Alert and in no acute distress  ED Course / MDM  EKG:   I have reviewed the labs performed to date as well as medications administered while in observation.  Recent changes in the last 24 hours include none.  Plan  Current plan is for psych placement.    Bethann Berkshire, MD 05/20/23 [id number]

## 2023-05-21 NOTE — Patient Outreach (Signed)
Care Coordination   Multidisciplinary Case Review Note    05/20/2023 Name: [name] MRN: [id number] DOB: 07-06-1927  [name] is a 89 y.o. year old female who sees Venita Sheffield, MD for primary care.  The  multidisciplinary care team met today to review patient care needs and barriers.    Care Coordination Interventions: Multidisciplinary case discussion to review patient ongoing care coordination needs  Patient to be engaged ongoing by RN Care Manager Angel Little and LCSW [name] to address disease management and social support needs   SDOH assessments and interventions completed:  No     Care Coordination Interventions Activated:  Yes   Care Coordination Interventions:  Yes, provided   Follow up plan: Follow up call scheduled for 1-2 weeks    Multidisciplinary Team Attendees:   Delsa Sale, RNCM [name], LCSW Roshanda Florance, [facility] RNCM  Scribe for Multidisciplinary Case Review:   [name], MSW, LCSW [facility] Care Management [facility]  [email or web address] Phone [phone] 12:46 PM

## 2023-05-21 NOTE — ED Provider Notes (Signed)
Emergency Medicine Observation Re-evaluation Note  [name] is a 89 y.o. female, seen on rounds today.  Pt initially presented to the ED for complaints of Psychiatric Evaluation Currently, the patient is asleep.  Physical Exam  BP 133/72 (BP Location: Right Arm)   Pulse 66   Temp 97.6 F (36.4 C)   Resp 18   Ht 5' (1.524 m)   Wt 41.7 kg   SpO2 98%   BMI 17.97 kg/m  Physical Exam General: asleep Cardiac: asleep Lungs: asleep Psych: asleep  ED Course / MDM  EKG:   I have reviewed the labs performed to date as well as medications administered while in observation.  Recent changes in the last 24 hours include lisinopril being added.  Plan  Current plan is for placement.    Pricilla Loveless, MD 05/21/23 1113

## 2023-05-21 NOTE — Progress Notes (Addendum)
CSW has reached out to [facility] at this time they stated that the patient's PASRR was still under review. [facility] stated that the patient may need a face to face evaluation. CSW has sent patient through the HUB. AT this time patient is awaiting PASRR and bed offer's. CSW will update APS worker.    Addend@ 5:01 pm  CSW spoke to [employer] patient's APS worker. This CSW explained at this time the patient does not have any bed offers as well PASRR was still pending. APS worker reports at this time she does not have a placement for the patient however does have court on Monday to try for temporary LG for the patient. TOC will continue to update when updates are available.

## 2023-05-22 ENCOUNTER — Other Ambulatory Visit: Payer: Self-pay

## 2023-05-22 NOTE — ED Provider Notes (Signed)
Emergency Medicine Observation Re-evaluation Note  [name] is a 89 y.o. female, seen on rounds today.  Pt initially presented to the ED for complaints of Psychiatric Evaluation Currently, the patient is awaiting placement.  Patient has no complaints.  Physical Exam  BP 107/71 (BP Location: Left Arm)   Pulse (!) 56   Temp 97.6 F (36.4 C) (Oral)   Resp 16   Ht 5' (1.524 m)   Wt 41.7 kg   SpO2 99%   BMI 17.97 kg/m  Physical Exam General: Alert no distress sitting up eating her breakfast Cardiac: Regular Lungs: Clear to auscultation Psych: Pleasantly interactive  ED Course / MDM  EKG:   I have reviewed the labs performed to date as well as medications administered while in observation.  Recent changes in the last 24 hours include none.  Plan  Current plan is for placement.    Arby Barrette, MD 05/22/23 1504

## 2023-05-22 NOTE — Discharge Instructions (Signed)
Please take medicines as prescribed  See your doctor for follow-up   Return to ER if you have thoughts of harming yourself or others

## 2023-05-22 NOTE — NC FL2 (Signed)
[place] MEDICAID FL2 LEVEL OF CARE FORM     IDENTIFICATION  Patient Name: [name] Birthdate: 1927/06/19 Sex: female Admission Date (Current Location): 05/18/2023  [facility] and [place] Number:  Producer, television/film/video and Address:  [facility],  [address] [place], [place] [address]      Provider Number: [id number]  Attending Physician Name and Address:  System, Provider Not In  Relative Name and Phone Number:  Darnetta, Demo)  [phone]    Current Level of Care: Hospital Recommended Level of Care: Assisted Living Facility Prior Approval Number:    Date Approved/Denied: 05/20/23 PASRR Number: [id number]  Discharge Plan: Other (Comment) (Assisted Living Facility)    Current Diagnoses: Patient Active Problem List   Diagnosis Date Noted   Psychosis (HCC) 05/19/2023   Adjustment disorder with depressed mood 05/19/2023   Mild neurocognitive disorder 05/19/2023   HLD (hyperlipidemia) 04/29/2023   GERD without esophagitis 03/08/2021   Chronic idiopathic constipation 03/08/2021   Moderate episode of recurrent major depressive disorder (HCC) 08/15/2019   Generalized anxiety disorder 08/15/2019   Recurrent major depressive disorder, in partial remission (HCC) 03/24/2019   Generalized osteoarthritis of multiple sites 03/24/2019   Need for influenza vaccination 04/01/2018   Malignant melanoma of left temple (HCC) 11/26/2017   Chronic venous insufficiency 07/26/2015   Urge incontinence of urine 07/26/2015   Glaucoma 07/26/2015   Postmenopausal estrogen deficiency 07/26/2015   Primary open angle glaucoma 01/02/2014   Hyperglycemia 11/18/2012   Hypertriglyceridemia, essential 11/18/2012   Essential hypertension, benign 11/18/2012   Peripheral neuropathy 11/18/2012    Orientation RESPIRATION BLADDER Height & Weight     Self, Situation, Place, Time  Normal Continent Weight: 92 lb (41.7 kg) Height:  5' (152.4 cm)  BEHAVIORAL SYMPTOMS/MOOD NEUROLOGICAL BOWEL  NUTRITION STATUS      Continent Diet (Regular)  AMBULATORY STATUS COMMUNICATION OF NEEDS Skin   Supervision Verbally Normal                       Personal Care Assistance Level of Assistance  Bathing, Feeding, Dressing Bathing Assistance: Limited assistance Feeding assistance: Independent Dressing Assistance: Limited assistance     Functional Limitations Info  Sight, Hearing, Speech Sight Info: Adequate Hearing Info: Adequate Speech Info: Adequate    SPECIAL CARE FACTORS FREQUENCY  PT (By licensed PT), OT (By licensed OT)     PT Frequency: X5 OT Frequency: X5            Contractures Contractures Info: Not present    Additional Factors Info  Code Status, Allergies Code Status Info: Full Allergies Info: Penicillins           Current Medications (05/22/2023):  This is the current hospital active medication list Current Facility-Administered Medications  Medication Dose Route Frequency Provider Last Rate Last Admin   brimonidine (ALPHAGAN) 0.2 % ophthalmic solution 1 drop  1 drop Right Eye BID Elpidio Anis, PA-C   1 drop at 05/22/23 1147   dorzolamide-timolol (COSOPT) 2-0.5 % ophthalmic solution 1 drop  1 drop Right Eye BID Elpidio Anis, PA-C   1 drop at 05/22/23 1147   gabapentin (NEURONTIN) capsule 300 mg  300 mg Oral BID Elpidio Anis, PA-C   300 mg at 05/22/23 1140   lisinopril (ZESTRIL) tablet 20 mg  20 mg Oral Daily Lorre Nick, MD   20 mg at 05/22/23 1140   Netarsudil-Latanoprost 0.02-0.005 % SOLN 1 drop  1 drop Right Eye QPM Upstill, Shari, PA-C   1 drop  at 05/21/23 1741   pantoprazole (PROTONIX) EC tablet 40 mg  40 mg Oral Daily Lorre Nick, MD   40 mg at 05/22/23 1139   QUEtiapine (SEROQUEL) tablet 25 mg  25 mg Oral QHS Onuoha, Josephine C, NP   25 mg at 05/21/23 2229   sertraline (ZOLOFT) tablet 100 mg  100 mg Oral Lorenda Hatchet, MD   100 mg at 05/21/23 2229   Current Outpatient Medications  Medication Sig Dispense Refill   ADVIL 200  MG CAPS Take 200 mg by mouth every 8 (eight) hours as needed (for pain or headaches).     ARIPiprazole (ABILIFY) 2 MG tablet Take 2 mg by mouth daily.     BIOTIN PO Take 1 capsule by mouth daily.     brimonidine (ALPHAGAN) 0.2 % ophthalmic solution Place 1 drop into the right eye 2 (two) times daily. 20 mL 2   diclofenac sodium (VOLTAREN) 1 % GEL Apply 2 g topically 4 (four) times daily. 350 g 2   dorzolamide-timolol (COSOPT) 2-0.5 % ophthalmic solution Place 1 drop into the right eye 2 (two) times daily. 30 mL 2   gabapentin (NEURONTIN) 300 MG capsule Take 1 capsule (300 mg total) by mouth 2 (two) times daily. (Patient taking differently: Take 300-600 mg by mouth See admin instructions. Take 300 mg by mouth in the morning and 300-600 mg by mouth at bedtime.) 180 capsule 1   lisinopril (ZESTRIL) 20 MG tablet Take 20 mg by mouth daily.     Netarsudil-Latanoprost (ROCKLATAN) 0.02-0.005 % SOLN Place 1 drop into the right eye every evening. 5 mL 2   omeprazole (PRILOSEC) 20 MG capsule Take 1 capsule (20 mg total) by mouth daily. 90 capsule 1   QUEtiapine (SEROQUEL XR) 50 MG TB24 24 hr tablet Take 1 tablet (50 mg total) by mouth daily. 90 tablet 2   sertraline (ZOLOFT) 100 MG tablet Take 1 tablet (100 mg total) by mouth at bedtime. 90 tablet 3   lisinopril (ZESTRIL) 10 MG tablet Take 1 tablet (10 mg total) by mouth daily. (Patient not taking: Reported on 05/19/2023) 90 tablet 3   prednisoLONE acetate (PRED FORTE) 1 % ophthalmic suspension Place 1 drop into the left eye 2 (two) times daily. (Patient not taking: Reported on 05/21/2023)       Discharge Medications: Please see discharge summary for a list of discharge medications.  Relevant Imaging Results:  Relevant Lab Results:   Additional Information SSN: [id number]  Princella Ion, LCSW

## 2023-05-22 NOTE — Progress Notes (Addendum)
PASRR: [id number]  Addend @ 2:51 PM CSW spoke with Primitivo Gauze, APS worker, to inform there are currently no bed offers. Shaune Pascal states the hearing on Monday is for a protective order to be able to make decisions on behalf of pt. Camilla reports she's not reached out to any facilities.   Addend @ 2:54 PM CSW attempted to contact pt's son. Left HIPAA compliant voicemail requesting call back.   Addend @ 3:01 PM TOC supervisor has spoken with Primitivo Gauze to inform that due to no bed offers, pt will need to discharge home with LTC placement pursued from the community. Camilla verbalized understanding. TOC supervisor did recommend ALF due to pt's ability to mobilize with minimum assist. Shaune Pascal has requested FL2. CSW to send via email.

## 2023-05-22 NOTE — Progress Notes (Signed)
CSW faxed FL2 to [email or web address].   Notified EDP and RN of discharge plan via secure chat. Requested EDP rescind IVC. Pt reports having her key to get into her home. Pt to transport home via [employer].

## 2023-05-22 NOTE — Progress Notes (Signed)
Physical Therapy Treatment Patient Details Name: [name] MRN: [id number] DOB: 1926/09/17 Today's Date: 05/22/2023   History of Present Illness 89 y.o. female brought in to ED for evaluation after being IVC'ed. Per the IVC paperwork patient thinks that her neighbor is coming into her house and trashing the place. Patient reports to this nurse that she is stressed due to this happening. Patient reports not eating and not taking medications.  PMH: OA, depression, GERD, hyperlipidemia, osteopenia.    PT Comments  General Comments: AxO x 3 pleasant Lady very sprite and aware of her current situation. Pt shared the following:  Resides at [employer] [address] "[facility] on [address] past 17 years.  She amb with a Rollator, self performs all ADL's as well as lite meal prep and laundry.  Son from [place] orders her grocceries and facility has laudry service for big items (blankets, towels, sheets).  She also has a life alert and her apartment has emergency pull cords. Assisted OOB to amb in hallway went well.  General transfer comment: pt self able to rise and stand with good use of hand to steady self and good safety cognition using her Rollator walker correctly. General Gait Details: pt tolerated a functional distance several Laps around unit using Rollator walker all at Supervision level.  No LOB.  No c/o pain.  Talked the time about her living situatiion.  Appears at prior mobility level.   Pt currently resides at [facility] but may need Assisted Living "at some point".   Per chart review, Pt plans to D/C back home "for now" with LTC placement pursued from community.   If plan is discharge home, recommend the following: A little help with walking and/or transfers;A little help with bathing/dressing/bathroom;Assistance with cooking/housework;Assist for transportation;Help with stairs or ramp for entrance;Direct supervision/assist for medications management   Can travel by private  vehicle     Yes  Equipment Recommendations  None recommended by PT    Recommendations for Other Services       Precautions / Restrictions Precautions Precautions: Fall Precaution Comments: falls Restrictions Weight Bearing Restrictions: No     Mobility  Bed Mobility Overal bed mobility: Modified Independent             General bed mobility comments: pt self able to transition to EOB    Transfers Overall transfer level: Needs assistance Equipment used: Rollator (4 wheels) Transfers: Sit to/from Stand Sit to Stand: Supervision           General transfer comment: pt self able to rise and stand with good use of hand to steady self and good safety cognition using her Rollator walker correctly.    Ambulation/Gait Ambulation/Gait assistance: Supervision Gait Distance (Feet): 300 Feet Assistive device: Rollator (4 wheels) Gait Pattern/deviations: Step-through pattern, Decreased stride length, Trunk flexed Gait velocity: WFL     General Gait Details: pt tolerated a functional distance several Laps around unit using Rollator walker all at Supervision level.  No LOB.  No c/o pain.  Takled the time about her living situatiion.   Stairs             Wheelchair Mobility     Tilt Bed    Modified Rankin (Stroke Patients Only)       Balance  Cognition Arousal: Alert Behavior During Therapy: WFL for tasks assessed/performed Overall Cognitive Status: Within Functional Limits for tasks assessed                                 General Comments: AxO x 3 pleasant Lady very sprite and aware of her current situation. Pt shared the following:  Resides at [employer] [address]ment "[facility]" on [address] past 17 years.  She amb with a Rollator, self performs all ADL's as well as lite meal prep and laundry.  Son from [place] orders her grocceries and facility has laudry service  for big items (blankets, towels, sheets).  She also has a life alert and her apartment has emergency pull cords.        Exercises      General Comments        Pertinent Vitals/Pain Pain Assessment Pain Assessment: Faces Faces Pain Scale: Hurts a little bit Pain Location: general Pain Descriptors / Indicators: Aching Pain Intervention(s): Monitored during session    Home Living                          Prior Function            PT Goals (current goals can now be found in the care plan section) Progress towards PT goals: Progressing toward goals    Frequency    Min 1X/week      PT Plan      Co-evaluation              AM-PAC PT "6 Clicks" Mobility   Outcome Measure  Help needed turning from your back to your side while in a flat bed without using bedrails?: None Help needed moving from lying on your back to sitting on the side of a flat bed without using bedrails?: None Help needed moving to and from a bed to a chair (including a wheelchair)?: None Help needed standing up from a chair using your arms (e.g., wheelchair or bedside chair)?: None Help needed to walk in hospital room?: A Little Help needed climbing 3-5 steps with a railing? : A Little 6 Click Score: 22    End of Session Equipment Utilized During Treatment: Gait belt Activity Tolerance: Patient tolerated treatment well Patient left: in bed;with call bell/phone within reach;with nursing/sitter in room Nurse Communication: Mobility status PT Visit Diagnosis: Unsteadiness on feet (R26.81);History of falling (Z91.81);Repeated falls (R29.6)     Time: 9811-9147 PT Time Calculation (min) (ACUTE ONLY): 15 min  Charges:    $Gait Training: 8-22 mins PT General Charges $$ ACUTE PT VISIT: 1 Visit                     Felecia Shelling  PTA Acute  Rehabilitation Services Office M-F          [phone]

## 2023-05-26 ENCOUNTER — Telehealth: Payer: Self-pay

## 2023-05-26 ENCOUNTER — Other Ambulatory Visit: Payer: Self-pay

## 2023-05-26 ENCOUNTER — Telehealth: Payer: Self-pay | Admitting: Licensed Clinical Social Worker

## 2023-05-26 ENCOUNTER — Other Ambulatory Visit (HOSPITAL_COMMUNITY): Payer: Self-pay

## 2023-05-26 NOTE — Patient Outreach (Signed)
Care Coordination   Follow Up Visit Note   05/25/2023 Name: [name] MRN: [id number] DOB: [birth year]  [name] is a 89 y.o. year old female who sees Venita Sheffield, MD for primary care. I spoke with  [name] unit coordinator by phone today.  What matters to the patients health and wellness today?  Housing and Symptom Management    Goals Addressed             This Visit's Progress    LCSW-Obtain Supportive Resources   On track    Activities and task to complete in order to accomplish goals.   Keep all upcoming appointments discussed today Continue with compliance of taking medication prescribed by Doctor Implement healthy coping skills discussed to assist with management of symptoms Continue working with [facility] care team to assist with goals identified F/up with [facility] F/up with Dr. Dione Booze office regarding medication review         SDOH assessments and interventions completed:  No     Care Coordination Interventions:  Yes, provided  Interventions Today    Flowsheet Row Most Recent Value  Chronic Disease   Chronic disease during today's visit Other  [Mild Cognitive Disorder, MDD]  General Interventions   General Interventions Discussed/Reviewed General Interventions Reviewed, Level of Care  [[name] reports pt was discharged to her unit on Friday. Shared patient was extremely upset of how (patient) left unit in disarray. Pt's son paid for apt to be cleaned on Sunday,  however, there are strong concerns that pt is unable to maintain apartment]  Level of Care Adult Daycare, Personal Care Services, Skilled Nursing Facility  [[facility] has informed staff that they are working on placement,  however, has not provided time-frame. Patient is at high risk of being evicted due to inability to care for self or maintain residence. Pt's son does not have funds to continue paying for aids]  Mental Health Interventions   Mental Health Discussed/Reviewed Mental Health  Reviewed  Charlyne Mom is exhibitting concerning behavior to staff at independent living]       Follow up plan: Follow up call scheduled for 1-2 weeks    Encounter Outcome:  Patient Visit Completed   [name], MSW, LCSW [facility] Care Management [facility]  [facility] [email or web address] Phone [phone] 12:27 PM

## 2023-05-26 NOTE — Patient Instructions (Signed)
Visit Information  Thank you for taking time to visit with me today. Please don't hesitate to contact me if I can be of assistance to you.   Following are the goals we discussed today:   Goals Addressed             This Visit's Progress    LCSW-Obtain Supportive Resources   On track    Activities and task to complete in order to accomplish goals.   Keep all upcoming appointments discussed today Continue with compliance of taking medication prescribed by Doctor Implement healthy coping skills discussed to assist with management of symptoms Continue working with [facility] care team to assist with goals identified F/up with [facility] F/up with Dr. Dione Booze office regarding medication review         Please call the care guide team at [phone] if you need to cancel or reschedule your appointment.   If you are experiencing a Mental Health or Behavioral Health Crisis or need someone to talk to, please call the Suicide and Crisis Lifeline: 988 call 911   [name], MSW, LCSW [facility] Care Management [facility]  [facility] [email or web address] Phone [phone] 12:28 PM

## 2023-05-26 NOTE — Progress Notes (Signed)
05/26/2023  Patient ID: [name], female   DOB: 1927-03-11, 89 y.o.   MRN: [id number]  Returning call from [name] at [facility].  She states patient was recently hospitalized and wants to verify there were no medication changes.  Patient is also out of eye drops at this time due to spilling them again.  Upon chart review, there were no medication changes.  Informed [name] that [facility] is processing oral medication refills for compliance packaging at this time, but her eye drops are too soon to refill.  She is going to contact [name]'s eye doctor about samples to carry her through until insurance will cover refills.  [name], PharmD, DPLA

## 2023-05-27 ENCOUNTER — Encounter: Payer: Self-pay | Admitting: Licensed Clinical Social Worker

## 2023-05-27 NOTE — Patient Outreach (Signed)
Care Coordination   Multidisciplinary Case Review Note    05/27/2023 Name: JAQUILLA BASILIO MRN: [id number] DOB: 1927/01/29  WERONIKA ERTL is a 89 y.o. year old female who sees Venita Sheffield, MD for primary care.  The  multidisciplinary care team met today to review patient care needs and barriers.     Goals Addressed   None     SDOH assessments and interventions completed:  No     Care Coordination Interventions Activated:  Yes   Care Coordination Interventions:  Yes, provided  Interventions Today    Flowsheet Row Most Recent Value  Chronic Disease   Chronic disease during today's visit Other  [Mild Cognitive Disorder, MDD]  General Interventions   General Interventions Discussed/Reviewed Communication with  Communication with RN, Social Work  [place] during multi-disciplinary case review]       Follow up plan:  RNCM and LCSW will provide ongoing support    Multidisciplinary Team Attendees:   Delsa Sale, RNCM Roshanda Canadian, TOC RNCM [name], LCSW [place], [place]  Scribe for Multidisciplinary Case Review:   [name], MSW, LCSW [facility] Care Management [place]  [facility] [email or web address] Phone [phone] 4:04 PM

## 2023-06-09 ENCOUNTER — Ambulatory Visit: Payer: Self-pay

## 2023-06-09 ENCOUNTER — Telehealth: Payer: Self-pay | Admitting: Licensed Clinical Social Worker

## 2023-06-09 NOTE — Patient Outreach (Addendum)
Care Coordination   Follow Up Visit Note   06/09/2023 Name: [name] MRN: [id number] DOB: 02-Jul-1927  [name] is a 89 y.o. year old female who sees Venita Sheffield, MD for primary care. I spoke with [name] care ambassador for [facility] by phone today.  What matters to the patients health and wellness today?  NA    Goals Addressed             This Visit's Progress    To get help with worsening depression   On track    Care Coordination Interventions: Completed successful outbound call with [name] patient ambassador (patient previously provided verbal consent to this RN to speak with [name] regarding PHI when needed) Evaluation of current treatment plan related to depression and patient's adherence to plan as established by provider Determined patient has been discharged back to her independent living facility at [facility] Discussed APS has initiated a court hearing on behalf of this patient that is scheduled for today, the goal is to determine if patient can continue to live in independent living and afford private care to assist with ADL's/iADL's and medication administration     Interventions Today    Flowsheet Row Most Recent Value  Chronic Disease   Chronic disease during today's visit Other  [dementia]  General Interventions   General Interventions Discussed/Reviewed Communication with, Level of Care  Communication with --  [name] care ambassador]  Mental Health Interventions   Mental Health Discussed/Reviewed Mental Health Discussed, Mental Health Reviewed  Pharmacy Interventions   Pharmacy Dicussed/Reviewed Pharmacy Topics Discussed, Pharmacy Topics Reviewed          SDOH assessments and interventions completed:  No     Care Coordination Interventions:  Yes, provided   Follow up plan: Follow up call scheduled for 07/07/23 @2 :30 PM     Encounter Outcome:  Patient Visit Completed

## 2023-06-09 NOTE — Patient Outreach (Signed)
Care Coordination   06/09/2023 Name: [name] MRN: [id number] DOB: Apr 12, 1927   Care Coordination Outreach Attempts:  An unsuccessful telephone outreach was attempted for a scheduled appointment today.  Follow Up Plan:  Additional outreach attempts will be made to offer the patient care coordination information and services.   Encounter Outcome:  No Answer   Care Coordination Interventions:  No, not indicated    Delsa Sale RN BSN CCM [place]  [facility], [facility] Health Nurse Care Coordinator  Direct Dial: [phone] Website: [email or web address]

## 2023-06-09 NOTE — Patient Outreach (Signed)
Care Coordination   Follow Up Visit Note   06/08/2023 Name: [name] MRN: [id number] DOB: 02-Dec-1926  [name] is a 89 y.o. year old female who sees Venita Sheffield, MD for primary care. I spoke with  [name] independent living coordinator, Lynden Ang, by phone today.  What matters to the patients health and wellness today?  Symptom Management and Supportive Resources    Goals Addressed             This Visit's Progress    LCSW-Obtain Supportive Resources   On track    Activities and task to complete in order to accomplish goals.   Keep all upcoming appointments discussed today Continue with compliance of taking medication prescribed by Doctor Implement healthy coping skills discussed to assist with management of symptoms Continue working with [facility] care team to assist with goals identified Continue working with DSS to assist with promotion of safety and well-being          SDOH assessments and interventions completed:  No     Care Coordination Interventions:  Yes, provided  Interventions Today    Flowsheet Row Most Recent Value  Chronic Disease   Chronic disease during today's visit Other  [Mild Cognitive Disorder and MDD]  General Interventions   General Interventions Discussed/Reviewed General Interventions Reviewed, [facility], Doctor Visits, Level of Care  Doctor Visits Discussed/Reviewed Doctor Visits Reviewed  Mental Health Interventions   Mental Health Discussed/Reviewed Mental Health Reviewed, Depression, Anxiety  [Patient has not endorsed delusions about neighbor in the last week. She has participated in arts and craft this week at independence living residence. Late daughter's best friend visited last week]  Pharmacy Interventions   Pharmacy Dicussed/Reviewed Pharmacy Topics Reviewed  [Patient reports ongoing forgetfulness with taking medicine from pill packs. Feels she may be more consistent with eye drops]  Safety Interventions    Safety Discussed/Reviewed Safety Reviewed  [DSS continues to work with patient to promote safety in the home. Pt has been awarded a Guardian Ad Litem, who will attend court hearing scheduled for 10/22]       Follow up plan: Follow up call scheduled for 1-2 weeks    Encounter Outcome:  Patient Visit Completed   [name], MSW, LCSW [facility] Care Management [facility]  [facility] [email or web address] Phone [phone] 12:11 PM

## 2023-06-09 NOTE — Patient Instructions (Signed)
Visit Information  Thank you for taking time to visit with me today. Please don't hesitate to contact me if I can be of assistance to you.   Following are the goals we discussed today:   Goals Addressed             This Visit's Progress    To get help with worsening depression   On track    Care Coordination Interventions: Completed successful outbound call with [name] patient ambassador (patient previously provided verbal consent to this RN to speak with [name] regarding PHI when needed) Evaluation of current treatment plan related to depression and patient's adherence to plan as established by provider Determined patient has been discharged back to her independent living facility at [facility] Discussed APS has initiated a court hearing on behalf of this patient that is scheduled for today, the goal is to determine if patient can continue to live in independent living and afford private care to assist with ADL's/iADL's and medication administration         Our next appointment is by telephone on 07/07/23 at 2:30 PM  Please call the care guide team at [phone] if you need to cancel or reschedule your appointment.   If you are experiencing a Mental Health or Behavioral Health Crisis or need someone to talk to, please call 1-800-273-TALK (toll free, 24 hour hotline)  Patient verbalizes understanding of instructions and care plan provided today and agrees to view in MyChart. Active MyChart status and patient understanding of how to access instructions and care plan via MyChart confirmed with patient.     Delsa Sale RN BSN CCM [place]  [facility], [facility] Health Nurse Care Coordinator  Direct Dial: [phone] Website: [email or web address]

## 2023-06-09 NOTE — Patient Instructions (Signed)
Visit Information  Thank you for taking time to visit with me today. Please don't hesitate to contact me if I can be of assistance to you.   Following are the goals we discussed today:   Goals Addressed             This Visit's Progress    LCSW-Obtain Supportive Resources   On track    Activities and task to complete in order to accomplish goals.   Keep all upcoming appointments discussed today Continue with compliance of taking medication prescribed by Doctor Implement healthy coping skills discussed to assist with management of symptoms Continue working with [facility] care team to assist with goals identified Continue working with DSS to assist with promotion of safety and well-being          Our next appointment is by telephone on 10/28 at 10 AM  Please call the care guide team at [phone] if you need to cancel or reschedule your appointment.   If you are experiencing a Mental Health or Behavioral Health Crisis or need someone to talk to, please call the Suicide and Crisis Lifeline: 988 call 911   Patient verbalizes understanding of instructions and care plan provided today and agrees to view in MyChart. Active MyChart status and patient understanding of how to access instructions and care plan via MyChart confirmed with patient.     [name], MSW, LCSW [facility] Care Management [place]  [facility] [email or web address] Phone [phone] 12:12 PM

## 2023-06-10 ENCOUNTER — Emergency Department (HOSPITAL_BASED_OUTPATIENT_CLINIC_OR_DEPARTMENT_OTHER): Payer: Medicare Other

## 2023-06-10 ENCOUNTER — Ambulatory Visit: Payer: Self-pay

## 2023-06-10 ENCOUNTER — Emergency Department (HOSPITAL_BASED_OUTPATIENT_CLINIC_OR_DEPARTMENT_OTHER)
Admission: EM | Admit: 2023-06-10 | Discharge: 2023-06-10 | Disposition: A | Discharge status: Home / Self Care | Payer: Medicare Other | Attending: Emergency Medicine | Admitting: Emergency Medicine

## 2023-06-10 ENCOUNTER — Emergency Department (HOSPITAL_BASED_OUTPATIENT_CLINIC_OR_DEPARTMENT_OTHER): Payer: Medicare Other | Admitting: Radiology

## 2023-06-10 ENCOUNTER — Other Ambulatory Visit: Payer: Self-pay

## 2023-06-10 DIAGNOSIS — S43001A Unspecified subluxation of right shoulder joint, initial encounter: Secondary | ICD-10-CM | POA: Diagnosis not present

## 2023-06-10 DIAGNOSIS — W07XXXA Fall from chair, initial encounter: Secondary | ICD-10-CM | POA: Insufficient documentation

## 2023-06-10 DIAGNOSIS — M19011 Primary osteoarthritis, right shoulder: Secondary | ICD-10-CM | POA: Diagnosis not present

## 2023-06-10 DIAGNOSIS — W19XXXA Unspecified fall, initial encounter: Secondary | ICD-10-CM

## 2023-06-10 DIAGNOSIS — J9811 Atelectasis: Secondary | ICD-10-CM | POA: Diagnosis not present

## 2023-06-10 DIAGNOSIS — R0781 Pleurodynia: Secondary | ICD-10-CM | POA: Diagnosis not present

## 2023-06-10 DIAGNOSIS — I6782 Cerebral ischemia: Secondary | ICD-10-CM | POA: Diagnosis not present

## 2023-06-10 DIAGNOSIS — R58 Hemorrhage, not elsewhere classified: Secondary | ICD-10-CM | POA: Diagnosis not present

## 2023-06-10 DIAGNOSIS — M25512 Pain in left shoulder: Secondary | ICD-10-CM | POA: Diagnosis not present

## 2023-06-10 DIAGNOSIS — R519 Headache, unspecified: Secondary | ICD-10-CM | POA: Diagnosis not present

## 2023-06-10 DIAGNOSIS — M542 Cervicalgia: Secondary | ICD-10-CM | POA: Diagnosis not present

## 2023-06-10 DIAGNOSIS — S0003XA Contusion of scalp, initial encounter: Secondary | ICD-10-CM | POA: Diagnosis not present

## 2023-06-10 DIAGNOSIS — M25511 Pain in right shoulder: Secondary | ICD-10-CM | POA: Diagnosis not present

## 2023-06-10 DIAGNOSIS — R531 Weakness: Secondary | ICD-10-CM | POA: Diagnosis not present

## 2023-06-10 DIAGNOSIS — I700 Atherosclerosis of aorta: Secondary | ICD-10-CM | POA: Diagnosis not present

## 2023-06-10 DIAGNOSIS — Z7401 Bed confinement status: Secondary | ICD-10-CM | POA: Diagnosis not present

## 2023-06-10 DIAGNOSIS — M19012 Primary osteoarthritis, left shoulder: Secondary | ICD-10-CM | POA: Diagnosis not present

## 2023-06-10 MED ORDER — ACETAMINOPHEN 325 MG PO TABS
650.0000 mg | ORAL_TABLET | Freq: Once | ORAL | Status: AC
  Administered 2023-06-10: 650 mg via ORAL
  Filled 2023-06-10: qty 2

## 2023-06-10 NOTE — ED Provider Notes (Signed)
[place] EMERGENCY DEPARTMENT AT [facility] Provider Note   CSN: [id number] Arrival date & time: 06/10/23  1413     History  Chief Complaint  Patient presents with   [name] is a 89 y.o. female with a history of osteopenia, mild neurocognitive disorder, and glaucoma who presents to the ED today after a fall.  Patient reports she was going to sit in the chair but missed the seat cushion.  She ended up hitting the back of her head and back on the wooden frame from the chair.  She endorses pain to the back of her head, bilateral shoulders, and back. Denies LOC or blood thinner use.  She was able to ambulate after the fall with the help of EMS. Patient uses a walker at baseline.  Denies weakness, dizziness, vision changes, slurred speech, or headache.  No additional complaints or concerns at the time.    Home Medications Prior to Admission medications   Medication Sig Start Date End Date Taking? Authorizing Provider  ADVIL 200 MG CAPS Take 200 mg by mouth every 8 (eight) hours as needed (for pain or headaches).    [provider]  BIOTIN PO Take 1 capsule by mouth daily.    [provider]  brimonidine (ALPHAGAN) 0.2 % ophthalmic solution Place 1 drop into the right eye 2 (two) times daily. 12/02/22     dorzolamide-timolol (COSOPT) 2-0.5 % ophthalmic solution Place 1 drop into the right eye 2 (two) times daily. 12/02/22     gabapentin (NEURONTIN) 300 MG capsule Take 1 capsule (300 mg total) by mouth 2 (two) times daily. Patient taking differently: Take 300-600 mg by mouth See admin instructions. Take 300 mg by mouth in the morning and 300-600 mg by mouth at bedtime. 04/27/23   Venita Sheffield, MD  lisinopril (ZESTRIL) 10 MG tablet Take 1 tablet (10 mg total) by mouth daily. 04/29/23   Venita Sheffield, MD  Netarsudil-Latanoprost (ROCKLATAN) 0.02-0.005 % SOLN Place 1 drop into the right eye every evening. 12/02/22     omeprazole (PRILOSEC) 20  MG capsule Take 1 capsule (20 mg total) by mouth daily. 04/27/23   Venita Sheffield, MD  QUEtiapine (SEROQUEL XR) 50 MG TB24 24 hr tablet Take 1 tablet (50 mg total) by mouth daily. 04/29/23   Venita Sheffield, MD  sertraline (ZOLOFT) 100 MG tablet Take 1 tablet (100 mg total) by mouth at bedtime. 01/27/23   Frederica Kuster, MD      Allergies    Penicillins    Review of Systems   Review of Systems  Constitutional:        Fall  All other systems reviewed and are negative.   Physical Exam Updated Vital Signs BP (!) 121/57   Pulse 60   Temp 98.2 F (36.8 C) (Oral)   Resp 15   Wt 44 kg   SpO2 97%   BMI 18.94 kg/m  Physical Exam Vitals and nursing note reviewed.  Constitutional:      General: She is not in acute distress.    Appearance: Normal appearance.  HENT:     Head: Normocephalic.     Comments: Hematoma present at occipital region of head    Mouth/Throat:     Mouth: Mucous membranes are moist.  Eyes:     Conjunctiva/sclera: Conjunctivae normal.     Pupils: Pupils are equal, round, and reactive to light.  Neck:     Comments: Midline tenderness without step-off or deformity Cardiovascular:  Rate and Rhythm: Normal rate and regular rhythm.     Pulses: Normal pulses.  Pulmonary:     Effort: Pulmonary effort is normal.     Breath sounds: Normal breath sounds.  Abdominal:     Palpations: Abdomen is soft.     Tenderness: There is no abdominal tenderness.  Musculoskeletal:        General: Tenderness present. No deformity. Normal range of motion.     Cervical back: Tenderness present.     Right lower leg: No edema.     Left lower leg: No edema.     Comments: Midline and paravertebral tenderness to thoracic spine without step off or obvious deformity. Strength of upper and lower extremities intact bilaterally. Sensation intact.  Skin:    General: Skin is warm and dry.     Findings: No rash.  Neurological:     General: No focal deficit present.     Mental  Status: She is alert.     Sensory: No sensory deficit.     Motor: No weakness.  Psychiatric:        Mood and Affect: Mood normal.        Behavior: Behavior normal.     ED Results / Procedures / Treatments   Labs (all labs ordered are listed, but only abnormal results are displayed) Labs Reviewed - No data to display  EKG None  Radiology CT Head Wo Contrast  Result Date: 06/10/2023 CLINICAL DATA:  Head trauma, moderate-severe fall; Back trauma, no prior imaging (Age >= 16y); Neck trauma (Age >= 65y) fall EXAM: CT HEAD WITHOUT CONTRAST CT CERVICAL SPINE WITHOUT CONTRAST CT THORACIC  SPINE WITHOUT CONTRAST TECHNIQUE: Multidetector CT imaging of the head, cervical spine, and thoracic spine was performed following the standard protocol without intravenous contrast. Multiplanar CT image reconstructions of the cervical spine were also generated. RADIATION DOSE REDUCTION: This exam was performed according to the departmental dose-optimization program which includes automated exposure control, adjustment of the mA and/or kV according to patient size and/or use of iterative reconstruction technique. COMPARISON:  CT head 01/22/23, CT abdomen 12/24/07 FINDINGS: CT HEAD FINDINGS Brain: No hemorrhage. Unchanged size and shape of the ventricular system. No extra-axial fluid collection. No CT evidence of an acute cortical infarct. Sequela moderate chronic microvascular ischemic change. Mineralization of the basal ganglia bilaterally Vascular: No hyperdense vessel or unexpected calcification. Skull: Small soft tissue hematoma along the posterior scalp. Negative for fracture or focal lesion. Sinuses/Orbits: No middle ear or mastoid effusion. Paranasal sinuses are notable for mucosal thickening right sphenoid sinus. Orbits are notable for bilateral lens replacement. Other: None. CT CERVICAL SPINE FINDINGS Alignment: Exaggerated cervical lordosis. Trace anterolisthesis of C5 on C6 and C6 on C7 Skull base and vertebrae:  Postsurgical changes from C3-C5 ACDF with solid osseous fusion. Redemonstrated chronic fractures the anterior and posterior arch of C1 Soft tissues and spinal canal: No prevertebral fluid or swelling. No visible canal hematoma. Disc levels: No evidence of high-grade spinal canal stenosis. Note that assessment is limited due to a combination of CT technique and streak artifact from metallic fusion hardware. Upper chest: Negative. Other: None CT THORACIC SPINE FINDINGS Alignment: Normal. Vertebrae: No acute fracture or focal pathologic process. Paraspinal and other soft tissues: Left basilar atelectasis. Aortic atherosclerotic calcifications. Gallbladder is distended. Chronic pancreatic calcifications, which can be seen in the setting of chronic pancreatitis. Hypodense lesion in the interpolar region of the left kidney is favored to represent a simple renal cyst. Redemonstrated left adrenal lesion, which  was previously characterized to be an adrenal adenoma. Disc levels: No evidence of high-grade spinal canal stenosis. IMPRESSION: 1. No CT evidence of intracranial injury. Small soft tissue hematoma along the posterior scalp. 2. No acute fracture or traumatic malalignment of the cervical or thoracic spine. Aortic Atherosclerosis (ICD10-I70.0). Electronically Signed   By: Lorenza Cambridge M.D.   On: 06/10/2023 18:37   CT Cervical Spine Wo Contrast  Result Date: 06/10/2023 CLINICAL DATA:  Head trauma, moderate-severe fall; Back trauma, no prior imaging (Age >= 16y); Neck trauma (Age >= 65y) fall EXAM: CT HEAD WITHOUT CONTRAST CT CERVICAL SPINE WITHOUT CONTRAST CT THORACIC  SPINE WITHOUT CONTRAST TECHNIQUE: Multidetector CT imaging of the head, cervical spine, and thoracic spine was performed following the standard protocol without intravenous contrast. Multiplanar CT image reconstructions of the cervical spine were also generated. RADIATION DOSE REDUCTION: This exam was performed according to the departmental  dose-optimization program which includes automated exposure control, adjustment of the mA and/or kV according to patient size and/or use of iterative reconstruction technique. COMPARISON:  CT head 01/22/23, CT abdomen 12/24/07 FINDINGS: CT HEAD FINDINGS Brain: No hemorrhage. Unchanged size and shape of the ventricular system. No extra-axial fluid collection. No CT evidence of an acute cortical infarct. Sequela moderate chronic microvascular ischemic change. Mineralization of the basal ganglia bilaterally Vascular: No hyperdense vessel or unexpected calcification. Skull: Small soft tissue hematoma along the posterior scalp. Negative for fracture or focal lesion. Sinuses/Orbits: No middle ear or mastoid effusion. Paranasal sinuses are notable for mucosal thickening right sphenoid sinus. Orbits are notable for bilateral lens replacement. Other: None. CT CERVICAL SPINE FINDINGS Alignment: Exaggerated cervical lordosis. Trace anterolisthesis of C5 on C6 and C6 on C7 Skull base and vertebrae: Postsurgical changes from C3-C5 ACDF with solid osseous fusion. Redemonstrated chronic fractures the anterior and posterior arch of C1 Soft tissues and spinal canal: No prevertebral fluid or swelling. No visible canal hematoma. Disc levels: No evidence of high-grade spinal canal stenosis. Note that assessment is limited due to a combination of CT technique and streak artifact from metallic fusion hardware. Upper chest: Negative. Other: None CT THORACIC SPINE FINDINGS Alignment: Normal. Vertebrae: No acute fracture or focal pathologic process. Paraspinal and other soft tissues: Left basilar atelectasis. Aortic atherosclerotic calcifications. Gallbladder is distended. Chronic pancreatic calcifications, which can be seen in the setting of chronic pancreatitis. Hypodense lesion in the interpolar region of the left kidney is favored to represent a simple renal cyst. Redemonstrated left adrenal lesion, which was previously characterized to be  an adrenal adenoma. Disc levels: No evidence of high-grade spinal canal stenosis. IMPRESSION: 1. No CT evidence of intracranial injury. Small soft tissue hematoma along the posterior scalp. 2. No acute fracture or traumatic malalignment of the cervical or thoracic spine. Aortic Atherosclerosis (ICD10-I70.0). Electronically Signed   By: Lorenza Cambridge M.D.   On: 06/10/2023 18:37   CT Thoracic Spine Wo Contrast  Result Date: 06/10/2023 CLINICAL DATA:  Head trauma, moderate-severe fall; Back trauma, no prior imaging (Age >= 16y); Neck trauma (Age >= 65y) fall EXAM: CT HEAD WITHOUT CONTRAST CT CERVICAL SPINE WITHOUT CONTRAST CT THORACIC  SPINE WITHOUT CONTRAST TECHNIQUE: Multidetector CT imaging of the head, cervical spine, and thoracic spine was performed following the standard protocol without intravenous contrast. Multiplanar CT image reconstructions of the cervical spine were also generated. RADIATION DOSE REDUCTION: This exam was performed according to the departmental dose-optimization program which includes automated exposure control, adjustment of the mA and/or kV according to patient size and/or use of  iterative reconstruction technique. COMPARISON:  CT head 01/22/23, CT abdomen 12/24/07 FINDINGS: CT HEAD FINDINGS Brain: No hemorrhage. Unchanged size and shape of the ventricular system. No extra-axial fluid collection. No CT evidence of an acute cortical infarct. Sequela moderate chronic microvascular ischemic change. Mineralization of the basal ganglia bilaterally Vascular: No hyperdense vessel or unexpected calcification. Skull: Small soft tissue hematoma along the posterior scalp. Negative for fracture or focal lesion. Sinuses/Orbits: No middle ear or mastoid effusion. Paranasal sinuses are notable for mucosal thickening right sphenoid sinus. Orbits are notable for bilateral lens replacement. Other: None. CT CERVICAL SPINE FINDINGS Alignment: Exaggerated cervical lordosis. Trace anterolisthesis of C5 on C6  and C6 on C7 Skull base and vertebrae: Postsurgical changes from C3-C5 ACDF with solid osseous fusion. Redemonstrated chronic fractures the anterior and posterior arch of C1 Soft tissues and spinal canal: No prevertebral fluid or swelling. No visible canal hematoma. Disc levels: No evidence of high-grade spinal canal stenosis. Note that assessment is limited due to a combination of CT technique and streak artifact from metallic fusion hardware. Upper chest: Negative. Other: None CT THORACIC SPINE FINDINGS Alignment: Normal. Vertebrae: No acute fracture or focal pathologic process. Paraspinal and other soft tissues: Left basilar atelectasis. Aortic atherosclerotic calcifications. Gallbladder is distended. Chronic pancreatic calcifications, which can be seen in the setting of chronic pancreatitis. Hypodense lesion in the interpolar region of the left kidney is favored to represent a simple renal cyst. Redemonstrated left adrenal lesion, which was previously characterized to be an adrenal adenoma. Disc levels: No evidence of high-grade spinal canal stenosis. IMPRESSION: 1. No CT evidence of intracranial injury. Small soft tissue hematoma along the posterior scalp. 2. No acute fracture or traumatic malalignment of the cervical or thoracic spine. Aortic Atherosclerosis (ICD10-I70.0). Electronically Signed   By: Lorenza Cambridge M.D.   On: 06/10/2023 18:37   DG Shoulder Right  Result Date: 06/10/2023 CLINICAL DATA:  Status post fall with bilateral shoulder pain. EXAM: RIGHT SHOULDER - 2+ VIEW COMPARISON:  01/22/2023 FINDINGS: There is superior subluxation of the humeral head abutting the undersurface of the acromion, consistent with chronic rotator cuff arthropathy. No evidence of dislocation, although glenohumeral alignment is difficult to assess on provided views. No acute fracture is seen. Chronic acromioclavicular degenerative change. Chronic calcifications adjacent to the lateral humeral head. IMPRESSION: 1. No  acute fracture or dislocation of the right shoulder. 2. Findings typical of chronic rotator cuff arthropathy. 3. Chronic calcifications adjacent to the lateral humeral head, may be calcific tendinitis. Electronically Signed   By: Narda Rutherford M.D.   On: 06/10/2023 16:49   DG Ribs Bilateral W/Chest  Result Date: 06/10/2023 CLINICAL DATA:  Status post fall with bilateral rib pain. EXAM: BILATERAL RIBS AND CHEST - 4+ VIEW COMPARISON:  Chest radiograph 03/03/2014 FINDINGS: No fracture or other bone lesions are seen involving the ribs. There is no evidence of pneumothorax or pleural effusion. Minor atelectasis at the left lung base. Stable heart size. Mediastinal contours are within normal limits. Aortic atherosclerosis. IMPRESSION: 1. No rib fracture or pulmonary complication. 2. Minor left basilar atelectasis. Electronically Signed   By: Narda Rutherford M.D.   On: 06/10/2023 16:48   DG Shoulder Left  Result Date: 06/10/2023 CLINICAL DATA:  Status post fall with bilateral shoulder pain. EXAM: LEFT SHOULDER - 2+ VIEW COMPARISON:  None Available. FINDINGS: There is no evidence of fracture or dislocation. Acromioclavicular degenerative change with spurring. Small subacromial spur. Subcortical cystic change in the lateral humeral head. There are curvilinear soft tissue calcifications  adjacent to the lateral humeral head. IMPRESSION: 1. No fracture or dislocation of the left shoulder. 2. Acromioclavicular degenerative change. 3. Soft tissue calcifications adjacent to the lateral humeral head may be calcific tendinitis. Electronically Signed   By: Narda Rutherford M.D.   On: 06/10/2023 16:46    Procedures Procedures: not indicated.   Medications Ordered in ED Medications  acetaminophen (TYLENOL) tablet 650 mg (650 mg Oral Given 06/10/23 1600)    ED Course/ Medical Decision Making/ A&P                                 Medical Decision Making Amount and/or Complexity of Data Reviewed Radiology:  ordered.  Risk OTC drugs.   This patient presents to the ED for concern of fall, this involves an extensive number of treatment options, and is a complaint that carries with it a high risk of complications and morbidity.   Differential diagnosis includes: SAH, SDH, contusion, concussion, migraine, headache, fracture, dislocation, contusion, etc.   Comorbidities  See HPI above   Additional History  Additional history obtained from PCP records.   Imaging Studies  I ordered imaging studies including left and right shoulder x-rays, bilateral rib x-rays, CT head, cervical and lumbar spine  I independently visualized and interpreted imaging which showed:  No rib fractures or pulmonary complication No acute fractures or dislocations of the shoulders. Chronic calcifications near humeral heads. CT head shows no evidence of intracranial injury. No acute fracture or traumatic misalignment of the cervical or thoracic spine. I agree with the radiologist interpretation   Problem List / ED Course / Critical Interventions / Medication Management  Fall I ordered medications including: Tylenol for pain  Reevaluation of the patient after these medicines showed that the patient improved I have reviewed the patients home medicines and have made adjustments as needed   Social Determinants of Health  Housing   Test / Admission - Considered  Discussed findings with patient. She is hemodynamically stable and safe for discharge home. Return precautions given.       Final Clinical Impression(s) / ED Diagnoses Final diagnoses:  Fall, initial encounter    Rx / DC Orders ED Discharge Orders     None         Maxwell Marion, PA-C 06/10/23 1846    Rexford Maus, DO 06/10/23 2028

## 2023-06-10 NOTE — ED Notes (Signed)
Pt labelled as a fall risk due to age and chief complaint of a fall today. Yellow socks and fall risk band placed on pt's feet and wrist, Call bell and personal belonging placed with in reach. Pt educated on the importance of calling for assistance before attempting to ambulate. Yellow fall risk sign placed outside of door.

## 2023-06-10 NOTE — ED Triage Notes (Addendum)
BIB [facility] from home. Fell sitting into chair and missed cushion. Struck back of head and shoulders. No LOC. Hematoma to back of head. No thinners. Complains of head and shoulder pain 5/10 pain. Pupils not equal- previous eye surg- this is baseline. No C spine pain. Mid back paraspinal pain. Ambulated after fall. Lives alone.

## 2023-06-10 NOTE — Patient Outreach (Signed)
Care Coordination   Multidisciplinary Case Review Note    06/10/2023 Name: TARESA SHERBERT MRN: [id number] DOB: [birth year]  SUMMAR COOLE is a 89 y.o. year old female who sees Venita Sheffield, MD for primary care.  The  multidisciplinary care team met today to review patient care needs and barriers.     SDOH assessments and interventions completed:  No   Care Coordination Interventions Activated:  Yes   Care Coordination Interventions:  Yes, provided   Follow up plan: Follow up call scheduled for 06/15/23 @10 :00 AM    Multidisciplinary Team Attendees:   [name] LCSW, Bevelyn Ngo BSW, Delsa Sale RN BSN CCM

## 2023-06-10 NOTE — Discharge Instructions (Signed)
As discussed, your imaging does not show any signs of fractures, dislocations, or head injury. You can take ibuprofen or tylenol as needed for pain.  Follow up with your PCP in 3-5 days for reevaluation.  Return to the ED if your symptoms worsen in the interim.

## 2023-06-10 NOTE — ED Notes (Signed)
Yellow DNR form noted at pt's bedside.

## 2023-06-10 NOTE — ED Notes (Signed)
Pt taken to ct and xray by radiology tech Jordon.

## 2023-06-10 NOTE — ED Notes (Signed)
Assisted patient with using a bed pan, pt was able to void.

## 2023-06-15 ENCOUNTER — Other Ambulatory Visit: Payer: Self-pay

## 2023-06-15 ENCOUNTER — Ambulatory Visit: Payer: Self-pay | Admitting: Licensed Clinical Social Worker

## 2023-06-15 NOTE — Patient Instructions (Signed)
Visit Information  Thank you for taking time to visit with me today. Please don't hesitate to contact me if I can be of assistance to you.   Following are the goals we discussed today:   Goals Addressed             This Visit's Progress    LCSW-Obtain Supportive Resources   On track    Activities and task to complete in order to accomplish goals.   Keep all upcoming appointments discussed today Continue with compliance of taking medication prescribed by Doctor Implement healthy coping skills discussed to assist with management of symptoms Continue working with [facility] care team to assist with goals identified Continue working with DSS to assist with promotion of safety and well-being          Our next appointment is by telephone on 11/06 at 1:30 PM  Please call the care guide team at [phone] if you need to cancel or reschedule your appointment.   If you are experiencing a Mental Health or Behavioral Health Crisis or need someone to talk to, please call the Suicide and Crisis Lifeline: 988 call 911   Patient verbalizes understanding of instructions and care plan provided today and agrees to view in MyChart. Active MyChart status and patient understanding of how to access instructions and care plan via MyChart confirmed with patient.     [name], MSW, LCSW [facility] Care Management [place]  [facility] [email or web address] Phone [phone] 2:38 PM

## 2023-06-15 NOTE — Patient Outreach (Signed)
Care Coordination   Follow Up Visit Note   06/15/2023 Name: [name] MRN: [id number] DOB: 10-11-1926  [name] is a 89 y.o. year old female who sees Venita Sheffield, MD for primary care. I spoke with  [name] by phone today.  What matters to the patients health and wellness today?  Level of Care, Symptom Management, and Supportive Resources    Goals Addressed             This Visit's Progress    LCSW-Obtain Supportive Resources   On track    Activities and task to complete in order to accomplish goals.   Keep all upcoming appointments discussed today Continue with compliance of taking medication prescribed by Doctor Implement healthy coping skills discussed to assist with management of symptoms Continue working with [facility] care team to assist with goals identified Continue working with DSS to assist with promotion of safety and well-being          SDOH assessments and interventions completed:  No     Care Coordination Interventions:  Yes, provided  Interventions Today    Flowsheet Row Most Recent Value  Chronic Disease   Chronic disease during today's visit Other  [Dementia]  General Interventions   General Interventions Discussed/Reviewed General Interventions Reviewed, [employer], Doctor Visits, Level of Care, Communication with  Doctor Visits Discussed/Reviewed Doctor Visits Reviewed  Communication with --  [employer] collaborated with independent Patent examiner about patient care concerns, barriers, and supportive needs]  Level of Care --  [Patient has not heard from her Guardian Ad Litem through DSS. Pt reports the need for an aid to remain independent in the home. States she is not interested in staying with a roommate]  Mental Health Interventions   Mental Health Discussed/Reviewed Mental Health Reviewed, Coping Strategies, Anxiety, Depression  Nutrition Interventions   Nutrition Discussed/Reviewed Nutrition Reviewed  Pharmacy  Interventions   Pharmacy Dicussed/Reviewed Pharmacy Topics Reviewed, Medication Adherence  Safety Interventions   Safety Discussed/Reviewed Safety Reviewed       Follow up plan: Follow up call scheduled for 1 week    Encounter Outcome:  Patient Visit Completed   [name], MSW, LCSW [facility] Care Management [facility]  [facility] [email or web address] Phone [phone] 2:37 PM

## 2023-06-17 ENCOUNTER — Telehealth: Payer: Self-pay | Admitting: *Deleted

## 2023-06-17 NOTE — Progress Notes (Signed)
Care Coordination Note  06/17/2023 Name: [name] MRN: [id number] DOB: 1927-07-12  [name] is a 89 y.o. year old female who is a primary care patient of Venita Sheffield, MD and is actively engaged with the care management team. I reached out to [name] by phone today to assist with re-scheduling a follow up visit with the RN Case Manager  Follow up plan: Unsuccessful telephone outreach attempt made. A HIPAA compliant phone message was left for the patient providing contact information and requesting a return call.   [facility]  Care Coordination Care Guide  Direct Dial: [phone]

## 2023-06-22 DIAGNOSIS — H43813 Vitreous degeneration, bilateral: Secondary | ICD-10-CM | POA: Diagnosis not present

## 2023-06-22 DIAGNOSIS — H401122 Primary open-angle glaucoma, left eye, moderate stage: Secondary | ICD-10-CM | POA: Diagnosis not present

## 2023-06-22 DIAGNOSIS — Z961 Presence of intraocular lens: Secondary | ICD-10-CM | POA: Diagnosis not present

## 2023-06-22 DIAGNOSIS — H401113 Primary open-angle glaucoma, right eye, severe stage: Secondary | ICD-10-CM | POA: Diagnosis not present

## 2023-06-24 ENCOUNTER — Ambulatory Visit: Payer: Medicare Other

## 2023-06-24 ENCOUNTER — Other Ambulatory Visit: Payer: Self-pay

## 2023-06-24 ENCOUNTER — Telehealth: Payer: Self-pay | Admitting: Licensed Clinical Social Worker

## 2023-06-24 ENCOUNTER — Encounter: Payer: Self-pay | Admitting: Licensed Clinical Social Worker

## 2023-06-24 ENCOUNTER — Other Ambulatory Visit (HOSPITAL_COMMUNITY): Payer: Self-pay

## 2023-06-24 NOTE — Progress Notes (Signed)
RNCM called 06/24/23  [name]  Care Coordination Care Guide  Direct Dial: [phone]

## 2023-06-24 NOTE — Patient Outreach (Signed)
  Care Coordination   06/24/2023 Name: [name] MRN: [id number] DOB: 1927/06/27   Care Coordination Outreach Attempts:  An unsuccessful telephone outreach was attempted for a scheduled appointment today.  Follow Up Plan:  Additional outreach attempts will be made to offer the patient care coordination information and services.   Encounter Outcome:  No Answer   Care Coordination Interventions:  No, not indicated    [name], MSW, LCSW [facility] Care Management [place]  [facility] [email or web address] Phone [phone] 1:39 PM

## 2023-06-24 NOTE — Patient Outreach (Signed)
  Care Coordination   06/24/2023 Name: [name] MRN: [id number] DOB: 04-15-1927   Care Coordination Outreach Attempts:  An unsuccessful telephone outreach was attempted for a scheduled appointment today.  Follow Up Plan:  Additional outreach attempts will be made to offer the patient care coordination information and services.   Encounter Outcome:  No Answer   Care Coordination Interventions:  No, not indicated    Delsa Sale RN BSN CCM [place]  [facility] Nurse Care Coordinator  Direct Dial: [phone] Website: [email or web address]

## 2023-06-25 ENCOUNTER — Emergency Department (HOSPITAL_COMMUNITY): Payer: Medicare Other

## 2023-06-25 ENCOUNTER — Encounter (HOSPITAL_COMMUNITY): Payer: Self-pay

## 2023-06-25 ENCOUNTER — Emergency Department (HOSPITAL_COMMUNITY)
Admission: EM | Admit: 2023-06-25 | Discharge: 2023-07-10 | Disposition: A | Discharge status: Home / Self Care | Payer: Medicare Other | Attending: Emergency Medicine | Admitting: Emergency Medicine

## 2023-06-25 ENCOUNTER — Other Ambulatory Visit: Payer: Self-pay

## 2023-06-25 ENCOUNTER — Ambulatory Visit: Payer: Self-pay

## 2023-06-25 DIAGNOSIS — Z85828 Personal history of other malignant neoplasm of skin: Secondary | ICD-10-CM | POA: Diagnosis not present

## 2023-06-25 DIAGNOSIS — Z87891 Personal history of nicotine dependence: Secondary | ICD-10-CM | POA: Insufficient documentation

## 2023-06-25 DIAGNOSIS — J9811 Atelectasis: Secondary | ICD-10-CM | POA: Diagnosis not present

## 2023-06-25 DIAGNOSIS — F329 Major depressive disorder, single episode, unspecified: Secondary | ICD-10-CM | POA: Insufficient documentation

## 2023-06-25 DIAGNOSIS — R419 Unspecified symptoms and signs involving cognitive functions and awareness: Secondary | ICD-10-CM | POA: Diagnosis not present

## 2023-06-25 DIAGNOSIS — I700 Atherosclerosis of aorta: Secondary | ICD-10-CM | POA: Diagnosis not present

## 2023-06-25 DIAGNOSIS — S0990XA Unspecified injury of head, initial encounter: Secondary | ICD-10-CM | POA: Diagnosis not present

## 2023-06-25 DIAGNOSIS — M545 Low back pain, unspecified: Secondary | ICD-10-CM | POA: Diagnosis not present

## 2023-06-25 DIAGNOSIS — W19XXXA Unspecified fall, initial encounter: Secondary | ICD-10-CM | POA: Insufficient documentation

## 2023-06-25 DIAGNOSIS — M542 Cervicalgia: Secondary | ICD-10-CM | POA: Diagnosis not present

## 2023-06-25 DIAGNOSIS — T50902A Poisoning by unspecified drugs, medicaments and biological substances, intentional self-harm, initial encounter: Secondary | ICD-10-CM | POA: Diagnosis not present

## 2023-06-25 DIAGNOSIS — I10 Essential (primary) hypertension: Secondary | ICD-10-CM | POA: Insufficient documentation

## 2023-06-25 DIAGNOSIS — R9089 Other abnormal findings on diagnostic imaging of central nervous system: Secondary | ICD-10-CM | POA: Diagnosis not present

## 2023-06-25 DIAGNOSIS — R918 Other nonspecific abnormal finding of lung field: Secondary | ICD-10-CM | POA: Diagnosis not present

## 2023-06-25 DIAGNOSIS — M48061 Spinal stenosis, lumbar region without neurogenic claudication: Secondary | ICD-10-CM | POA: Diagnosis not present

## 2023-06-25 DIAGNOSIS — X838XXA Intentional self-harm by other specified means, initial encounter: Secondary | ICD-10-CM | POA: Diagnosis not present

## 2023-06-25 DIAGNOSIS — I6523 Occlusion and stenosis of bilateral carotid arteries: Secondary | ICD-10-CM | POA: Diagnosis not present

## 2023-06-25 DIAGNOSIS — I959 Hypotension, unspecified: Secondary | ICD-10-CM | POA: Diagnosis not present

## 2023-06-25 DIAGNOSIS — Z79899 Other long term (current) drug therapy: Secondary | ICD-10-CM | POA: Insufficient documentation

## 2023-06-25 DIAGNOSIS — R45851 Suicidal ideations: Secondary | ICD-10-CM | POA: Diagnosis not present

## 2023-06-25 DIAGNOSIS — D3502 Benign neoplasm of left adrenal gland: Secondary | ICD-10-CM | POA: Diagnosis not present

## 2023-06-25 DIAGNOSIS — Z981 Arthrodesis status: Secondary | ICD-10-CM | POA: Diagnosis not present

## 2023-06-25 DIAGNOSIS — R9389 Abnormal findings on diagnostic imaging of other specified body structures: Secondary | ICD-10-CM | POA: Diagnosis not present

## 2023-06-25 DIAGNOSIS — T148XXA Other injury of unspecified body region, initial encounter: Secondary | ICD-10-CM | POA: Diagnosis not present

## 2023-06-25 DIAGNOSIS — M549 Dorsalgia, unspecified: Secondary | ICD-10-CM | POA: Diagnosis not present

## 2023-06-25 LAB — CBC
HCT: 40.2 % (ref 36.0–46.0)
Hemoglobin: 12.7 g/dL (ref 12.0–15.0)
MCH: 29.6 pg (ref 26.0–34.0)
MCHC: 31.6 g/dL (ref 30.0–36.0)
MCV: 93.7 fL (ref 80.0–100.0)
Platelets: 187 10*3/uL (ref 150–400)
RBC: 4.29 MIL/uL (ref 3.87–5.11)
RDW: 13.8 % (ref 11.5–15.5)
WBC: 8.8 10*3/uL (ref 4.0–10.5)
nRBC: 0 % (ref 0.0–0.2)

## 2023-06-25 LAB — COMPREHENSIVE METABOLIC PANEL
ALT: 15 U/L (ref 0–44)
AST: 18 U/L (ref 15–41)
Albumin: 3.6 g/dL (ref 3.5–5.0)
Alkaline Phosphatase: 58 U/L (ref 38–126)
Anion gap: 9 (ref 5–15)
BUN: 30 mg/dL — ABNORMAL HIGH (ref 8–23)
CO2: 26 mmol/L (ref 22–32)
Calcium: 8.9 mg/dL (ref 8.9–10.3)
Chloride: 103 mmol/L (ref 98–111)
Creatinine, Ser: 0.69 mg/dL (ref 0.44–1.00)
GFR, Estimated: >60 mL/min (ref >60)
Glucose, Bld: 107 mg/dL — ABNORMAL HIGH (ref 70–99)
Potassium: 4.2 mmol/L (ref 3.5–5.1)
Sodium: 138 mmol/L (ref 135–145)
Total Bilirubin: 0.4 mg/dL (ref <1.2)
Total Protein: 6.1 g/dL — ABNORMAL LOW (ref 6.5–8.1)

## 2023-06-25 LAB — RAPID URINE DRUG SCREEN, HOSP PERFORMED
Amphetamines: NOT DETECTED
Barbiturates: NOT DETECTED
Benzodiazepines: NOT DETECTED
Cocaine: NOT DETECTED
Opiates: NOT DETECTED
Tetrahydrocannabinol: NOT DETECTED

## 2023-06-25 LAB — ETHANOL: Alcohol, Ethyl (B): <10 mg/dL (ref <10)

## 2023-06-25 LAB — SALICYLATE LEVEL: Salicylate Lvl: <7 mg/dL — ABNORMAL LOW (ref 7.0–30.0)

## 2023-06-25 LAB — ACETAMINOPHEN LEVEL: Acetaminophen (Tylenol), Serum: <10 ug/mL — ABNORMAL LOW (ref 10–30)

## 2023-06-25 MED ORDER — ACETAMINOPHEN 325 MG PO TABS
650.0000 mg | ORAL_TABLET | Freq: Once | ORAL | Status: AC
Start: 2023-06-25 — End: 2023-06-25
  Administered 2023-06-25: 650 mg via ORAL
  Filled 2023-06-25: qty 2

## 2023-06-25 MED ORDER — OLANZAPINE 5 MG PO TABS
5.0000 mg | ORAL_TABLET | Freq: Once | ORAL | Status: AC
  Administered 2023-06-25: 5 mg via ORAL
  Filled 2023-06-25: qty 1

## 2023-06-25 MED ORDER — SODIUM CHLORIDE 0.9 % IV BOLUS: 1000.0000 mL | Freq: Once | INTRAVENOUS | Status: DC

## 2023-06-25 NOTE — ED Provider Notes (Signed)
[place] EMERGENCY DEPARTMENT AT [facility] Provider Note  CSN: [id number] Arrival date & time: 06/25/23 4098  Chief Complaint(s) Fall (/) and Suicidal  HPI [name] is a 89 y.o. female history of depression, hypertension, hyperlipidemia, presenting to the emergency department with fall and suicidal ideation.  Patient reports that yesterday she was getting laundry out of the dryer when she fell.  She thinks she is landed on her bottom.  She reports low back pain and neck pain.  Does not think she hit her head.  No numbness or tingling, weakness.  She think she was able to get up and get to her bed and then called the paramedics this morning.  No nausea or vomiting.  No headaches.  No vision changes.  No pain in arms or legs.  She also reports suicidal ideation due to feelings of loneliness.  She reports that she tried to ingest 11 pills sometime in the early afternoon, around 2-3 PM before she fell.  She is not really sure what pills she took but it was stuff that she was prescribed.  Appears she is prescribed gabapentin, lisinopril, omeprazole, Seroquel, and Zoloft.  She reports she maybe feels a little drowsy but otherwise normal.   Past Medical History Past Medical History:  Diagnosis Date   Arthritis    Osteoarthritis   Depression    GERD (gastroesophageal reflux disease)    Hyperlipidemia    Osteopenia    Patient Active Problem List   Diagnosis Date Noted   Psychosis (HCC) 05/19/2023   Adjustment disorder with depressed mood 05/19/2023   Mild neurocognitive disorder 05/19/2023   HLD (hyperlipidemia) 04/29/2023   GERD without esophagitis 03/08/2021   Chronic idiopathic constipation 03/08/2021   Moderate episode of recurrent major depressive disorder (HCC) 08/15/2019   Generalized anxiety disorder 08/15/2019   Recurrent major depressive disorder, in partial remission (HCC) 03/24/2019   Generalized osteoarthritis of multiple sites 03/24/2019   Need for  influenza vaccination 04/01/2018   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 Authorizing Provider  ADVIL 200 MG CAPS Take 200 mg by mouth every 8 (eight) hours as needed (for pain or headaches).    [provider]  BIOTIN PO Take 1 capsule by mouth daily.    [provider]  brimonidine (ALPHAGAN) 0.2 % ophthalmic solution Place 1 drop into the right eye 2 (two) times daily. 12/02/22     dorzolamide-timolol (COSOPT) 2-0.5 % ophthalmic solution Place 1 drop into the right eye 2 (two) times daily. 12/02/22     gabapentin (NEURONTIN) 300 MG capsule Take 1 capsule (300 mg total) by mouth 2 (two) times daily. Patient taking differently: Take 300-600 mg by mouth See admin instructions. Take 300 mg by mouth in the morning and 300-600 mg by mouth at bedtime. 04/27/23   Venita Sheffield, MD  lisinopril (ZESTRIL) 10 MG tablet Take 1 tablet (10 mg total) by mouth daily. 04/29/23   Venita Sheffield, MD  Netarsudil-Latanoprost (ROCKLATAN) 0.02-0.005 % SOLN Place 1 drop into the right eye every evening. 12/02/22     omeprazole (PRILOSEC) 20 MG capsule Take  1 capsule (20 mg total) by mouth daily. 04/27/23   Venita Sheffield, MD  QUEtiapine (SEROQUEL XR) 50 MG TB24 24 hr tablet Take 1 tablet (50 mg total) by mouth daily. 04/29/23   Venita Sheffield, MD  sertraline (ZOLOFT) 100 MG tablet Take 1 tablet (100 mg total) by mouth at bedtime. 01/27/23   Frederica Kuster, MD                                                                                                                                     Past Surgical History Past Surgical History:  Procedure Laterality Date   APPENDECTOMY  1936   CERVICAL FUSION  2004   COLON SURGERY  06/12/2010   Colonoscopy   EYE SURGERY Left 2024   FINGER SURGERY  1989   MELANOMA EXCISION  11/16/2017   forehead   SPINE SURGERY  2009   Back Fusion   TONSILLECTOMY  1941   Family History History reviewed. No pertinent family history.  Social History Social History   Tobacco Use   Smoking status: Former   Smokeless tobacco: Never   Tobacco comments:    Quit at age 23   Vaping Use   Vaping status: Never Used  Substance Use Topics   Alcohol use: Not Currently    Comment: Occasionally    Drug use: No   Allergies Penicillins  Review of Systems Review of Systems  All other systems reviewed and are negative.   Physical Exam Vital Signs  I have reviewed the triage vital signs BP (!) 108/58 (BP Location: Left Arm)   Pulse 65   Temp 97.8 F (36.6 C) (Oral)   Resp 12   SpO2 98%  Physical Exam Vitals and nursing note reviewed.  Constitutional:      General: She is not in acute distress.    Appearance: She is well-developed.  HENT:     Head: Normocephalic and atraumatic.     Mouth/Throat:     Mouth: Mucous membranes are moist.  Eyes:     Pupils: Pupils are equal, round, and reactive to light.  Cardiovascular:     Rate and Rhythm: Normal rate and regular rhythm.     Heart sounds: No murmur heard. Pulmonary:     Effort: Pulmonary effort is normal. No respiratory distress.     Breath sounds: Normal breath sounds.  Abdominal:     General: Abdomen is flat.     Palpations: Abdomen is soft.     Tenderness: There is no abdominal tenderness.  Musculoskeletal:        General: No tenderness.     Right lower leg: No edema.     Left lower leg: No edema.     Comments: Mild midline lumbar and cervical spine tenderness without step-off..  No thoracic tenderness.  Full active  range of motion of the bilateral upper and lower extremities with no focal tenderness or deformity  Skin:    General: Skin is warm and dry.  Neurological:     General: No focal deficit present.     Mental Status: She is alert. Mental status is at baseline.  Psychiatric:        Mood and Affect: Mood normal.        Behavior: Behavior normal.     ED Results and Treatments Labs (all labs ordered are listed, but only abnormal results are displayed) Labs Reviewed  COMPREHENSIVE METABOLIC PANEL - Abnormal; Notable for the following components:      Result Value   Glucose, Bld 107 (*)    BUN 30 (*)    Total Protein 6.1 (*)    All other components within normal limits  SALICYLATE LEVEL - Abnormal; Notable for the following components:   Salicylate Lvl <7.0 (*)    All other components within normal limits  ACETAMINOPHEN LEVEL - Abnormal; Notable for the following components:   Acetaminophen (Tylenol), Serum <10 (*)    All other components within normal limits  ETHANOL  CBC  RAPID URINE DRUG SCREEN, HOSP PERFORMED                                                                                                                          Radiology CT Lumbar Spine Wo Contrast  Result Date: 06/25/2023 CLINICAL DATA:  Fall, back pain EXAM: CT LUMBAR SPINE WITHOUT CONTRAST TECHNIQUE: Multidetector CT imaging of the lumbar spine was performed without intravenous contrast administration. Multiplanar CT image reconstructions were also generated. RADIATION DOSE REDUCTION: This exam was performed according to the departmental dose-optimization program which includes automated exposure control, adjustment of the mA and/or kV according to patient size and/or use of iterative reconstruction technique. COMPARISON:  Lumbar spine CT 01/22/2023 FINDINGS: Segmentation: Standard; the lowest formed disc space is designated L5-S1. Alignment: There is exaggerated lumbar lordosis with stepwise grade 1 retrolisthesis  of T12 on L1 through L3 on L4, unchanged. There is no evidence of traumatic malalignment. Vertebrae: Vertebral body heights are preserved. There is no evidence of acute fracture. There is no suspicious osseous lesion. Postsurgical changes reflecting L4 through S1 posterior instrumented fusion and decompression are again noted without evidence of complication. Paraspinal and other soft tissues: There is a left adrenal adenoma, unchanged going back to 2006 requiring no specific imaging follow-up. The paraspinal soft tissues are unremarkable. Disc levels: There is a marked disc space narrowing throughout the lumbar spine at the nonsurgical levels, unchanged. There is up to mild-to-moderate spinal canal stenosis at L2-L3. IMPRESSION: No acute fracture or traumatic malalignment of the lumbar spine. Electronically Signed   By: Lesia Hausen M.D.   On: 06/25/2023 13:07   CT Head Wo Contrast  Result Date: 06/25/2023 CLINICAL DATA:  Fall, head trauma, pain EXAM: CT HEAD WITHOUT CONTRAST CT CERVICAL SPINE WITHOUT CONTRAST TECHNIQUE: Multidetector CT imaging of the head and cervical spine was performed following the standard protocol without intravenous contrast. Multiplanar CT image reconstructions of  the cervical spine were also generated. RADIATION DOSE REDUCTION: This exam was performed according to the departmental dose-optimization program which includes automated exposure control, adjustment of the mA and/or kV according to patient size and/or use of iterative reconstruction technique. COMPARISON:  CT head and cervical spine 06/10/2023 FINDINGS: CT HEAD FINDINGS Brain: There is no acute intracranial hemorrhage, extra-axial fluid collection, or acute infarct. There is mild parenchymal volume loss with prominence of the ventricular system and extra-axial CSF spaces. Gray-white differentiation is preserved. Confluent hypodensity in the supratentorial white matter likely reflects underlying chronic small-vessel ischemic  change. The pituitary and suprasellar region are normal. There is no mass lesion. There is no mass effect or midline shift. Vascular: There is calcification of the bilateral carotid siphons. Skull: Normal. Negative for fracture or focal lesion. Sinuses/Orbits: There is chronic right sphenoid sinusitis. Bilateral lens implants are in place. The globes and orbits are otherwise unremarkable. Other: The mastoid air cells and middle ear cavities are clear. CT CERVICAL SPINE FINDINGS Alignment: There is unchanged stepwise degenerative anterolisthesis C5 on C6 and C6 on C7. There is no evidence of traumatic malalignment. Skull base and vertebrae: Skull base alignment is maintained. Vertebral body heights are preserved. Chronic fractures of the anterior and posterior C1 arch are again noted. There is no evidence of acute fracture. There is no suspicious osseous lesion. Postsurgical changes reflecting C3 through C5 ACDF are noted without evidence of complication. There is solid fusion across the disc spaces. Soft tissues and spinal canal: No prevertebral fluid or swelling. No visible canal hematoma. Disc levels: There is advanced disc space narrowing with associated degenerative endplate change C5-C6 and C6-C7, unchanged. There is no evidence of high-grade spinal canal stenosis. Upper chest: The imaged lung apices are clear. Other: None. IMPRESSION: 1. No acute intracranial pathology. 2. No acute fracture or traumatic malalignment of the cervical spine. Electronically Signed   By: Lesia Hausen M.D.   On: 06/25/2023 12:58   CT Cervical Spine Wo Contrast  Result Date: 06/25/2023 CLINICAL DATA:  Fall, head trauma, pain EXAM: CT HEAD WITHOUT CONTRAST CT CERVICAL SPINE WITHOUT CONTRAST TECHNIQUE: Multidetector CT imaging of the head and cervical spine was performed following the standard protocol without intravenous contrast. Multiplanar CT image reconstructions of the cervical spine were also generated. RADIATION DOSE  REDUCTION: This exam was performed according to the departmental dose-optimization program which includes automated exposure control, adjustment of the mA and/or kV according to patient size and/or use of iterative reconstruction technique. COMPARISON:  CT head and cervical spine 06/10/2023 FINDINGS: CT HEAD FINDINGS Brain: There is no acute intracranial hemorrhage, extra-axial fluid collection, or acute infarct. There is mild parenchymal volume loss with prominence of the ventricular system and extra-axial CSF spaces. Gray-white differentiation is preserved. Confluent hypodensity in the supratentorial white matter likely reflects underlying chronic small-vessel ischemic change. The pituitary and suprasellar region are normal. There is no mass lesion. There is no mass effect or midline shift. Vascular: There is calcification of the bilateral carotid siphons. Skull: Normal. Negative for fracture or focal lesion. Sinuses/Orbits: There is chronic right sphenoid sinusitis. Bilateral lens implants are in place. The globes and orbits are otherwise unremarkable. Other: The mastoid air cells and middle ear cavities are clear. CT CERVICAL SPINE FINDINGS Alignment: There is unchanged stepwise degenerative anterolisthesis C5 on C6 and C6 on C7. There is no evidence of traumatic malalignment. Skull base and vertebrae: Skull base alignment is maintained. Vertebral body heights are preserved. Chronic fractures of the anterior and posterior  C1 arch are again noted. There is no evidence of acute fracture. There is no suspicious osseous lesion. Postsurgical changes reflecting C3 through C5 ACDF are noted without evidence of complication. There is solid fusion across the disc spaces. Soft tissues and spinal canal: No prevertebral fluid or swelling. No visible canal hematoma. Disc levels: There is advanced disc space narrowing with associated degenerative endplate change C5-C6 and C6-C7, unchanged. There is no evidence of high-grade  spinal canal stenosis. Upper chest: The imaged lung apices are clear. Other: None. IMPRESSION: 1. No acute intracranial pathology. 2. No acute fracture or traumatic malalignment of the cervical spine. Electronically Signed   By: Lesia Hausen M.D.   On: 06/25/2023 12:58    Pertinent labs & imaging results that were available during my care of the patient were reviewed by me and considered in my medical decision making (see MDM for details).  Medications Ordered in ED Medications - No data to display                                                                                                                                    Procedures Procedures  (including critical care time)  Medical Decision Making / ED Course   MDM:  89 year old presenting to the emergency department with ingestion, fall.  Patient overall well-appearing, physical exam with mild midline cervical and lumbar tenderness.  Will obtain CT C and L-spine.  Extremities otherwise atraumatic.  She reports that his fall was due to losing balance when she was unloading laundry rather than syncopal event.  No loss of consciousness.  She reports she was able to get up eventually on her own.  No clear head injury but patient not fully sure so obtain CT head.  She also reports ingestion of suicidal intent.  Nursing contacted poison control who recommend 24-hour observation from time of ingestion and typical poison control labs including Tylenol and aspirin level.  Patient reports that she ingested these substances around 2-3 PM so we will observe until around 2-3 PM today.  Anticipate patient will need psychiatric consult following this.  Clinical Course as of 06/25/23 1610  Thu Jun 25, 2023  1311 CT scans negative.  Patient still pending 24-hour ops to complete medical clearance for her ingestion.  This should occur at around 3 PM. [WS]  1549 Patient still awake, oriented.  She has been able to ambulate.  Will consult psychiatry.   Patient medically cleared by observation period.  [WS]    Clinical Course User Index [WS] Lonell Grandchild, MD     Additional history obtained: -Additional history obtained from ems -External records from outside source obtained and reviewed including: Chart review including previous notes, labs, imaging, consultation notes including prior er notes    Lab Tests: -I ordered, reviewed, and interpreted labs.   The pertinent results include:   Labs Reviewed  COMPREHENSIVE METABOLIC PANEL - Abnormal; Notable for the  following components:      Result Value   Glucose, Bld 107 (*)    BUN 30 (*)    Total Protein 6.1 (*)    All other components within normal limits  SALICYLATE LEVEL - Abnormal; Notable for the following components:   Salicylate Lvl <7.0 (*)    All other components within normal limits  ACETAMINOPHEN LEVEL - Abnormal; Notable for the following components:   Acetaminophen (Tylenol), Serum <10 (*)    All other components within normal limits  ETHANOL  CBC  RAPID URINE DRUG SCREEN, HOSP PERFORMED    Notable for negative tylenol and salicylate level   EKG   EKG Interpretation Date/Time:  Thursday June 25 2023 09:20:28 EST Ventricular Rate:  70 PR Interval:  182 QRS Duration:  88 QT Interval:  387 QTC Calculation: 418 R Axis:   -18  Text Interpretation: Sinus rhythm Borderline left axis deviation Probable anteroseptal infarct, recent Confirmed by Alvino Blood ([id number]) on 06/25/2023 9:38:23 AM         Imaging Studies ordered: I ordered imaging studies including CT head, C spine and L spine On my interpretation imaging demonstrates no acute injury  I independently visualized and interpreted imaging. I agree with the radiologist interpretation   Medicines ordered and prescription drug management: Meds ordered this encounter  Medications   DISCONTD: sodium chloride 0.9 % bolus 1,000 mL    -I have reviewed the patients home medicines and have made  adjustments as needed   Social Determinants of Health:  Diagnosis or treatment significantly limited by social determinants of health: lives alone   Reevaluation: After the interventions noted above, I reevaluated the patient and found that their symptoms have improved  Co morbidities that complicate the patient evaluation  Past Medical History:  Diagnosis Date   Arthritis    Osteoarthritis   Depression    GERD (gastroesophageal reflux disease)    Hyperlipidemia    Osteopenia       Dispostion: Disposition decision including need for hospitalization was considered, and patient boarding for psychiatric evaluation    Final Clinical Impression(s) / ED Diagnoses Final diagnoses:  Ingestion of unknown drug, intentional self-harm, initial encounter [facility])     This chart was dictated using voice recognition software.  Despite best efforts to proofread,  errors can occur which can change the documentation meaning.    Lonell Grandchild, MD 06/25/23 1610

## 2023-06-25 NOTE — ED Notes (Signed)
Spoke with poison control and case created for pt. Recommend to check CMP, BMP, Ethanol level, Tylenol/Salicylate level, and EKG. Pt to be observed for 24 hrs and provide supportive care. Benzo medications for agitation. Poison control to call for followup.

## 2023-06-25 NOTE — ED Triage Notes (Signed)
Pt coming from home. Pt coming in following a fall, pt states she was trying to get to her walker when she fell before reaching it. Denies any LOC or hitting her head. Pt does endorse increased back pain. EMS also mentioned the apartments she is from mentioned pt has been making comments about hurting herself and suicidal thoughts. Pt admits to having suicidal thoughts, and taking pills last night. Pt states that she regrets taking the pills and currently denying SI thoughts. Pt unsure how many pills she took or what they were.

## 2023-06-25 NOTE — ED Notes (Signed)
Took patient to the bathroom because patient stated she needed to urinate. Patient did not provide any urine. Took patient back to room. Patient pleasantly demented with this nurse.

## 2023-06-25 NOTE — ED Notes (Signed)
Call from [facility] poison who requested repeat EKG  which was completed at 22:35. Results shared with her  and she stated that the case is now closed.

## 2023-06-25 NOTE — Patient Outreach (Signed)
Care Coordination   Case Collaboration  Visit Note   06/25/2023 Name: [name] MRN: [id number] DOB: [birth year]  [name] is a 89 y.o. year old female who sees Venita Sheffield, MD for primary care. I spoke with patient's care ambassador [name] by phone today.  What matters to the patients health and wellness today?  NA    Goals Addressed             This Visit's Progress    RN Care Coordination Activities: further follow up needed       Care Coordination Interventions: Inbound call received from [facility] care ambassador with [facility], patient's place of residence Determined patient experienced a fall while in her apartment this morning for which EMS was called Discussed patient stated to the care ambassador, "I do not want to be her anymore", she proceeded to ask if the care ambassador had any pills she would giving her Discussed patient was transported to [facility] for further evaluation  Discussed [name]'s concerns that patient may be discharged home without receiving adequate psych evaluation and patient has expressed suicidal ideation with a plan as she asked her ambassador for "pills" Determined patient was unsafely discharged to her apartment following her last admission due to patient's care ambassador was not informed of the discharge and patient had no food in her home and the apartment had unsanitary conditions Determined patient is not taking her medications as prescribed due to difficulty remembering, she often drops her pills on the floor as she is visually impaired for which she often misses doses of her prescribed eye drops Discussed [name] contacted Camilla S. Assigned SW with APS and notified her of today's events and patient's transfer to [facility] ED Collaboration with Charlesetta Shanks RN Population Health hospital liaison to inform her patient's ED visit and the above stated concerns  Determined if patient is admitted,  [place] can follow this patient and offer collaboration to the health care team  Collaborated with Clinical Supervisor Marja Kays regarding patient's level of care concerns by [facility], advised of patient's multiple falls and the inability for her to care for herself, advised of APS involvement and patient's ineligibility to be placed at this time, advised of patient's suicidal ideation and noted attempt per hospital MD that patient took 11 pills last evening of which she does not know she took in effort to kill herself  Placed call to [facility] SW supervisor in order to provide patient history and APS involvement as well as patient's unsafe discharge following her last admission, left a detailed voice message with this RN's contact information and requested a return call as soon as possible Placed outbound call to Primitivo Gauze SW with APS, left a detailed voice message introducing myself and providing contact number for a return call as soon as possible to discuss this patient's health status Noted ED plan of care;   Nursing contacted poison control who recommend 24-hour observation from time of ingestion and typical poison control labs including Tylenol and aspirin level.  Patient reports that she ingested these substances around 2-3 PM so we will observe until around 2-3 PM today.  Anticipate patient will need psychiatric consult following this.     Interventions Today    Flowsheet Row Most Recent Value  Chronic Disease   Chronic disease during today's visit Diabetes  General Interventions   General Interventions Discussed/Reviewed Doctor Visits, Level of Care, Communication with  Communication with  RN, Social Work  Theatre stage manager,  [name] LCSW]  Mental Health Interventions   Mental Health Discussed/Reviewed Mental Health Discussed, Mental Health Reviewed, Suicide  [patient transported to [facility] ED]  Pharmacy Interventions   Pharmacy  Dicussed/Reviewed Pharmacy Topics Reviewed, Medication Adherence  Medication Adherence Not taking medication  Safety Interventions   Safety Discussed/Reviewed Home Safety, Safety Reviewed, Safety Discussed, Fall Risk          SDOH assessments and interventions completed:  No     Care Coordination Interventions:  Yes, provided   Follow up plan: Follow up call scheduled for 06/26/23 @10 :00 AM    Encounter Outcome:  Patient Visit Completed

## 2023-06-26 DIAGNOSIS — R419 Unspecified symptoms and signs involving cognitive functions and awareness: Secondary | ICD-10-CM | POA: Diagnosis not present

## 2023-06-26 MED ORDER — STERILE WATER FOR INJECTION IJ SOLN
INTRAMUSCULAR | Status: AC
  Administered 2023-06-26: 1.2 mL
  Filled 2023-06-26: qty 10

## 2023-06-26 MED ORDER — DORZOLAMIDE HCL-TIMOLOL MAL 2-0.5 % OP SOLN
1.0000 [drp] | Freq: Two times a day (BID) | OPHTHALMIC | Status: DC
  Administered 2023-06-27 – 2023-06-29 (×5): 1 [drp] via OPHTHALMIC
  Filled 2023-06-26: qty 10

## 2023-06-26 MED ORDER — SERTRALINE HCL 50 MG PO TABS
100.0000 mg | ORAL_TABLET | Freq: Every day | ORAL | Status: DC
  Administered 2023-06-27 – 2023-07-09 (×13): 100 mg via ORAL
  Filled 2023-06-26 (×14): qty 2

## 2023-06-26 MED ORDER — NETARSUDIL-LATANOPROST 0.02-0.005 % OP SOLN: 1.0000 [drp] | Freq: Every evening | OPHTHALMIC | Status: DC

## 2023-06-26 MED ORDER — PANTOPRAZOLE SODIUM 40 MG PO TBEC
40.0000 mg | DELAYED_RELEASE_TABLET | Freq: Every day | ORAL | Status: DC
Start: 2023-06-26 — End: 2023-07-10
  Administered 2023-06-26 – 2023-07-10 (×15): 40 mg via ORAL
  Filled 2023-06-26 (×15): qty 1

## 2023-06-26 MED ORDER — OLANZAPINE 10 MG IM SOLR
2.5000 mg | Freq: Once | INTRAMUSCULAR | Status: AC
Start: 2023-06-26 — End: 2023-06-26
  Administered 2023-06-26: 2.5 mg via INTRAMUSCULAR
  Filled 2023-06-26: qty 10

## 2023-06-26 MED ORDER — LISINOPRIL 10 MG PO TABS
10.0000 mg | ORAL_TABLET | Freq: Every day | ORAL | Status: DC
  Administered 2023-06-26 – 2023-06-29 (×4): 10 mg via ORAL
  Filled 2023-06-26 (×4): qty 1

## 2023-06-26 MED ORDER — BRIMONIDINE TARTRATE 0.2 % OP SOLN
1.0000 [drp] | Freq: Two times a day (BID) | OPHTHALMIC | Status: DC
  Administered 2023-06-27 – 2023-06-29 (×5): 1 [drp] via OPHTHALMIC
  Filled 2023-06-26: qty 5

## 2023-06-26 MED ORDER — GABAPENTIN 300 MG PO CAPS
300.0000 mg | ORAL_CAPSULE | Freq: Two times a day (BID) | ORAL | Status: DC
  Administered 2023-06-26 – 2023-07-10 (×28): 300 mg via ORAL
  Filled 2023-06-26 (×28): qty 1

## 2023-06-26 MED ORDER — QUETIAPINE FUMARATE ER 50 MG PO TB24
50.0000 mg | ORAL_TABLET | Freq: Every day | ORAL | Status: DC
  Administered 2023-06-26 – 2023-07-10 (×15): 50 mg via ORAL
  Filled 2023-06-26 (×17): qty 1

## 2023-06-26 MED ORDER — LORAZEPAM 0.5 MG PO TABS
0.5000 mg | ORAL_TABLET | Freq: Once | ORAL | Status: AC
  Administered 2023-06-26: 0.5 mg via ORAL
  Filled 2023-06-26: qty 1

## 2023-06-26 NOTE — Evaluation (Signed)
Physical Therapy Evaluation Patient Details Name: [name] MRN: [id number] DOB: 1927/07/13 Today's Date: 06/26/2023  History of Present Illness  KHALILAH HARDNETT is a 89 y.o. female presents with fall and suicidal ideation. PMH: depression, HTN, hyperlipidemia, osteopenia, GERD  Clinical Impression  Pt admitted with above diagnosis. Pt not oriented during eval, reports living in apartment and using walker but unable to specify RW vs rollator, admits to falls, reports son lives in [place] and has her groceries ordered/delivered. Eval limited due to d/c transportation arrival. Pt demo BLE weakness and balance deficits, needing min A with transfers and ambulation with RW for ~20 ft to w/c. Pt is a high fall risk and would benefit from supv/assist at home due to unsteadiness noted on eval and history of falls noted. Per chart review, pt with assistance in the home and APS seeking LTC placement. Pt currently with functional limitations due to the deficits listed below (see PT Problem List). Pt will benefit from acute skilled PT to increase their independence and safety with mobility to allow discharge.            If plan is discharge home, recommend the following: A little help with walking and/or transfers;A little help with bathing/dressing/bathroom;Assistance with cooking/housework;Assist for transportation   Can travel by private vehicle   Yes    Equipment Recommendations None recommended by PT  Recommendations for Other Services       Functional Status Assessment Patient has had a recent decline in their functional status and demonstrates the ability to make significant improvements in function in a reasonable and predictable amount of time.     Precautions / Restrictions Precautions Precautions: Fall Precaution Comments: falls Restrictions Weight Bearing Restrictions: No      Mobility  Bed Mobility               General bed mobility comments: in recliner upon arrival     Transfers Overall transfer level: Needs assistance Equipment used: Rolling walker (2 wheels) Transfers: Sit to/from Stand Sit to Stand: Min assist           General transfer comment: min A to steady and power up from recliner, verbal cues for hand palcement on RW    Ambulation/Gait Ambulation/Gait assistance: Min assist Gait Distance (Feet): 20 Feet Assistive device: Rolling walker (2 wheels) Gait Pattern/deviations: Step-to pattern, Narrow base of support, Trunk flexed       General Gait Details: pt taking short steps wtih trunk flexed and narrow BOS, 1 minor LOB able to recover without additional assistance, min A for general unsteadiness, assist to maneuver RW through doorframe  Stairs            Wheelchair Mobility     Tilt Bed    Modified Rankin (Stroke Patients Only)       Balance Overall balance assessment: Mild deficits observed, not formally tested, History of Falls                                           Pertinent Vitals/Pain Pain Assessment Pain Assessment: No/denies pain    Home Living Family/patient expects to be discharged to:: Private residence Living Arrangements: Alone   Type of Home: Apartment             Additional Comments: per pt and chart review, she lives in apartment and her son lives in [place]  Prior Function Prior Level of Function : Patient poor historian/Family not available             Mobility Comments: pt reports using walker, admits to falls, unable to specify type of walker but chart review states rollator ADLs Comments: pt reports ind with self care, states her son has her groceries ordered and delivered     Extremity/Trunk Assessment   Upper Extremity Assessment Upper Extremity Assessment: Overall WFL for tasks assessed    Lower Extremity Assessment Lower Extremity Assessment: Generalized weakness    Cervical / Trunk Assessment Cervical / Trunk Assessment: Kyphotic   Communication   Communication Communication: No apparent difficulties  Cognition Arousal: Alert Behavior During Therapy: WFL for tasks assessed/performed Overall Cognitive Status: No family/caregiver present to determine baseline cognitive functioning                                 General Comments: pt pleasant, not oriented to place or time. Pt states month is August, year is 2024, and "Reagan no [employer] Pacini" is president. Pt reports a cat jumped on her, reports many cats "in this place". Pt usnure of location reports "they said it's not a living room", therapist oriented pt to being in hospital.        General Comments      Exercises     Assessment/Plan    PT Assessment Patient needs continued PT services  PT Problem List Decreased strength;Decreased activity tolerance;Decreased balance;Decreased cognition;Decreased knowledge of use of DME;Decreased safety awareness       PT Treatment Interventions DME instruction;Gait training;Functional mobility training;Therapeutic activities;Therapeutic exercise;Balance training;Neuromuscular re-education;Patient/family education    PT Goals (Current goals can be found in the Care Plan section)  Acute Rehab PT Goals Patient Stated Goal: agreeable to walk PT Goal Formulation: With patient Time For Goal Achievement: 07/10/23 Potential to Achieve Goals: Good    Frequency Min 1X/week     Co-evaluation               AM-PAC PT "6 Clicks" Mobility  Outcome Measure Help needed turning from your back to your side while in a flat bed without using bedrails?: A Little Help needed moving from lying on your back to sitting on the side of a flat bed without using bedrails?: A Little Help needed moving to and from a bed to a chair (including a wheelchair)?: A Little Help needed standing up from a chair using your arms (e.g., wheelchair or bedside chair)?: A Little Help needed to walk in hospital room?: A Little Help needed  climbing 3-5 steps with a railing? : A Lot 6 Click Score: 17    End of Session Equipment Utilized During Treatment: Gait belt Activity Tolerance: Patient tolerated treatment well Patient left: Other (comment) (in w/c discharging) Nurse Communication: Mobility status PT Visit Diagnosis: Unsteadiness on feet (R26.81);History of falling (Z91.81);Muscle weakness (generalized) (M62.81)    Time: 1610-9604 PT Time Calculation (min) (ACUTE ONLY): 21 min   Charges:   PT Evaluation $PT Eval Low Complexity: 1 Low   PT General Charges $$ ACUTE PT VISIT: 1 Visit        Tori Jadore Veals PT, DPT 06/26/23, 9:36 AM
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
## 2023-06-26 NOTE — ED Notes (Signed)
Patient alert and cooperative.  Patient confused with limited safety awareness. Patient needs assist with ADLS. Patient needs assist to ambulate.

## 2023-06-26 NOTE — ED Notes (Signed)
Patient restless, anxious, confused.  Staring at ceiling.  Seeing things that are not there.

## 2023-06-26 NOTE — ED Provider Notes (Signed)
Notified that patient is requesting something for anxiety.  0.5 mg Ativan ordered   Linwood Dibbles, MD 06/26/23 1756

## 2023-06-26 NOTE — [place] FL2 (Signed)
[place] MEDICAID FL2 LEVEL OF CARE FORM     IDENTIFICATION  Patient Name: [name] Birthdate: [birth year] Sex: female Admission Date (Current Location): 06/25/2023  [facility] and [place] Number:  Producer, television/film/video and Address:  [facility],  [address] [place], [place] [address]      Provider Number: [id number]  Attending Physician Name and Address:  System, Provider Not In  Relative Name and Phone Number:  Zaneta, Orenstein)  [phone]    Current Level of Care: Hospital Recommended Level of Care: Skilled Nursing Facility Prior Approval Number:    Date Approved/Denied:   PASRR Number:    Discharge Plan: Home    Current Diagnoses: Patient Active Problem List   Diagnosis Date Noted   Psychosis (HCC) 05/19/2023   Adjustment disorder with depressed mood 05/19/2023   Mild neurocognitive disorder 05/19/2023   HLD (hyperlipidemia) 04/29/2023   GERD without esophagitis 03/08/2021   Chronic idiopathic constipation 03/08/2021   Moderate episode of recurrent major depressive disorder (HCC) 08/15/2019   Generalized anxiety disorder 08/15/2019   Recurrent major depressive disorder, in partial remission (HCC) 03/24/2019   Generalized osteoarthritis of multiple sites 03/24/2019   Need for influenza vaccination 04/01/2018   Malignant melanoma of left temple (HCC) 11/26/2017   Chronic venous insufficiency 07/26/2015   Urge incontinence of urine 07/26/2015   Glaucoma 07/26/2015   Postmenopausal estrogen deficiency 07/26/2015   Primary open angle glaucoma 01/02/2014   Hyperglycemia 11/18/2012   Hypertriglyceridemia, essential 11/18/2012   Essential hypertension, benign 11/18/2012   Peripheral neuropathy 11/18/2012    Orientation RESPIRATION BLADDER Height & Weight     Self  Normal Incontinent Weight:   Height:     BEHAVIORAL SYMPTOMS/MOOD NEUROLOGICAL BOWEL NUTRITION STATUS      Incontinent Diet (Regular)  AMBULATORY STATUS COMMUNICATION OF NEEDS Skin    Independent Verbally Normal                       Personal Care Assistance Level of Assistance  Bathing, Feeding, Dressing Bathing Assistance: Limited assistance Feeding assistance: Limited assistance Dressing Assistance: Limited assistance     Functional Limitations Info  Sight, Hearing, Speech Sight Info: Adequate Hearing Info: Adequate Speech Info: Adequate    SPECIAL CARE FACTORS FREQUENCY                       Contractures Contractures Info: Not present    Additional Factors Info  Code Status, Allergies Code Status Info: Full Allergies Info: Penicillins           Current Medications (06/26/2023):  This is the current hospital active medication list Current Facility-Administered Medications  Medication Dose Route Frequency Provider Last Rate Last Admin   brimonidine (ALPHAGAN) 0.2 % ophthalmic solution 1 drop  1 drop Right Eye BID Rancour, Jeannett Senior, MD       dorzolamide-timolol (COSOPT) 2-0.5 % ophthalmic solution 1 drop  1 drop Right Eye BID Rancour, Stephen, MD       gabapentin (NEURONTIN) capsule 300 mg  300 mg Oral BID Rancour, Stephen, MD       lisinopril (ZESTRIL) tablet 10 mg  10 mg Oral Daily Rancour, Stephen, MD       Netarsudil-Latanoprost 0.02-0.005 % SOLN 1 drop  1 drop Right Eye QPM Rancour, Stephen, MD       pantoprazole (PROTONIX) EC tablet 40 mg  40 mg Oral Daily Rancour, Stephen, MD       QUEtiapine (SEROQUEL XR) 24 hr  tablet 50 mg  50 mg Oral Daily Rancour, Stephen, MD       sertraline (ZOLOFT) tablet 100 mg  100 mg Oral QHS Rancour, Stephen, MD       Current Outpatient Medications  Medication Sig Dispense Refill   ADVIL 200 MG CAPS Take 200 mg by mouth every 8 (eight) hours as needed (for pain or headaches).     BIOTIN PO Take 1 capsule by mouth daily.     brimonidine (ALPHAGAN) 0.2 % ophthalmic solution Place 1 drop into the right eye 2 (two) times daily. 20 mL 2   dorzolamide-timolol (COSOPT) 2-0.5 % ophthalmic solution Place 1 drop  into the right eye 2 (two) times daily. 30 mL 2   gabapentin (NEURONTIN) 300 MG capsule Take 1 capsule (300 mg total) by mouth 2 (two) times daily. (Patient taking differently: Take 300-600 mg by mouth See admin instructions. Take 300 mg by mouth in the morning and 300-600 mg by mouth at bedtime.) 180 capsule 1   lisinopril (ZESTRIL) 10 MG tablet Take 1 tablet (10 mg total) by mouth daily. 90 tablet 3   Netarsudil-Latanoprost (ROCKLATAN) 0.02-0.005 % SOLN Place 1 drop into the right eye every evening. 5 mL 2   omeprazole (PRILOSEC) 20 MG capsule Take 1 capsule (20 mg total) by mouth daily. 90 capsule 1   QUEtiapine (SEROQUEL XR) 50 MG TB24 24 hr tablet Take 1 tablet (50 mg total) by mouth daily. 90 tablet 2   sertraline (ZOLOFT) 100 MG tablet Take 1 tablet (100 mg total) by mouth at bedtime. 90 tablet 3     Discharge Medications: Please see discharge summary for a list of discharge medications.  Relevant Imaging Results:  Relevant Lab Results:   Additional Information SSN: [id number]  Princella Ion, LCSW

## 2023-06-26 NOTE — ED Notes (Signed)
Patient staring.  Seeing things not there.  Thinks daughter is here.
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
## 2023-06-26 NOTE — Progress Notes (Addendum)
No bed offers at this time. Some referral still pending.   Addend @ 2:59 PM Update provided to APS caseworker, Primitivo Gauze. 14 offers pending. 3 of 14 are considering but have escalated case to leadership for review.

## 2023-06-26 NOTE — Progress Notes (Signed)
Transition of Care [facility]) - Emergency Department Mini Assessment   Patient Details  Name: [name] MRN: [id number] Date of Birth: 18-Jan-1927  Transition of [facility]) CM/SW Contact:    Princella Ion, LCSW Phone Number: 06/26/2023, 9:17 AM   Clinical Narrative: [facility] consulted for SNF. CSW outreached to pt's APS case worker, Primitivo Gauze, who reported pt was sent to ED for suicidal ideation. CSW informed Shaune Pascal that per chart review, pt was psychiatrically cleared. Shaune Pascal stated the pt does not qualify for Special Assistance Medicaid which would cover ALF. She states the pt will need to return home and they will work from the community to place pt into LTC. Camilla requested CSW sent an FL2 for SNF since pt does not qualify for special assistance Medicaid.   Shaune Pascal reported the pt son is paying for the pt to have assistance in the home. CSW notified RN, EDP, and PT of the above information. Shaune Pascal reported she also visited the pt in the home last week and the home "looked fine." APS is following the pt in the community. CSW informed Shaune Pascal that pt can transport home via safe transport and Shaune Pascal is in agreement. No further TOC needs at this time.    ED Mini Assessment: What brought you to the Emergency Department? : SI, fall  Barriers to Discharge: No Barriers Identified     Means of departure: Other (enter comment) Acupuncturist)       Patient Surveyor, quantity 1: Primitivo Gauze (APS) - [phone]   Spoke with: Primitivo Gauze (APS) - [phone] Contact Date: 06/26/23,   Contact time: 2725 Contact Phone Number: [phone] Call outcome: APS states pt will have to discharge home and they will work towards LTC in the community         Admission diagnosis:  fall;confusion Patient Active Problem List   Diagnosis Date Noted   Psychosis (HCC) 05/19/2023   Adjustment disorder with depressed mood 05/19/2023   Mild neurocognitive disorder  05/19/2023   HLD (hyperlipidemia) 04/29/2023   GERD without esophagitis 03/08/2021   Chronic idiopathic constipation 03/08/2021   Moderate episode of recurrent major depressive disorder (HCC) 08/15/2019   Generalized anxiety disorder 08/15/2019   Recurrent major depressive disorder, in partial remission (HCC) 03/24/2019   Generalized osteoarthritis of multiple sites 03/24/2019   Need for influenza vaccination 04/01/2018   Malignant melanoma of left temple (HCC) 11/26/2017   Chronic venous insufficiency 07/26/2015   Urge incontinence of urine 07/26/2015   Glaucoma 07/26/2015   Postmenopausal estrogen deficiency 07/26/2015   Primary open angle glaucoma 01/02/2014   Hyperglycemia 11/18/2012   Hypertriglyceridemia, essential 11/18/2012   Essential hypertension, benign 11/18/2012   Peripheral neuropathy 11/18/2012   PCP:  Venita Sheffield, MD Pharmacy:   [facility] [address] [place] [address] Phone: [phone] Fax: [phone]

## 2023-06-26 NOTE — ED Notes (Signed)
Patient continues to be confused, restless, anxious, agitated.   Patient has no insight or safety awareness at this time.

## 2023-06-26 NOTE — ED Provider Notes (Addendum)
Emergency Medicine Observation Re-evaluation Note  [name] is a 89 y.o. female, seen on rounds today.  Pt initially presented to the ED for complaints of Fall (/) and Suicidal Currently, the patient is in bed being redirected by staff.  Physical Exam  BP 108/71 (BP Location: Left Arm)   Pulse 62   Temp 97.9 F (36.6 C) (Oral)   Resp 16   SpO2 99%  Physical Exam Vitals reviewed.  Constitutional:      Appearance: Normal appearance.  Cardiovascular:     Rate and Rhythm: Normal rate.  Pulmonary:     Effort: Pulmonary effort is normal.  Abdominal:     General: There is no distension.  Neurological:     General: No focal deficit present.     Mental Status: She is alert.  Psychiatric:        Mood and Affect: Mood normal.      ED Course / MDM  EKG:EKG Interpretation Date/Time:  Thursday June 25 2023 09:20:28 EST Ventricular Rate:  70 PR Interval:  182 QRS Duration:  88 QT Interval:  387 QTC Calculation: 418 R Axis:   -18  Text Interpretation: Sinus rhythm Borderline left axis deviation Probable anteroseptal infarct, recent Confirmed by Alvino Blood ([id number]) on 06/25/2023 9:38:23 AM  I have reviewed the labs performed to date as well as medications administered while in observation.  Recent changes in the last 24 hours include psychiatry evaluation.  Plan  Current plan is for likely placement.  Still pending TOC evaluation.  Update 8:50 AM-patient cleared by psychiatry.  She and evaluation by our social work team, are able to confirm with the patient's APS worker that this is the patient's baseline, and she has assistance at home.  She is ambulatory, and is at her baseline.  Her APS worker is currently working on getting her into a facility in the community.  Will discharge.   Anders Simmonds T, DO 06/26/23 0758    Arletha Pili, DO 06/26/23 6045    Anders Simmonds T, DO 06/26/23 [phone]

## 2023-06-26 NOTE — Discharge Instructions (Signed)
Please follow up with your primary care doctor this week.

## 2023-06-26 NOTE — ED Notes (Signed)
Patient seeing things not there.  Talking to people not there.

## 2023-06-26 NOTE — [place] FL2 (Signed)
MEDICAID FL2 LEVEL OF CARE FORM     IDENTIFICATION  Patient Name: [name] Birthdate: [birth year] Sex: female Admission Date (Current Location): 06/25/2023  [facility] and [place] Number:  Producer, television/film/video and Address:  [facility],  [address] [place], [place] [address]      Provider Number: [id number]  Attending Physician Name and Address:  Arletha Pili, DO  Relative Name and Phone Number:  Miyanna, [place])  [phone]    Current Level of Care: Hospital Recommended Level of Care: Skilled Nursing Facility Prior Approval Number:    Date Approved/Denied:   PASRR Number: PENDING  Discharge Plan: SNF    Current Diagnoses: Patient Active Problem List   Diagnosis Date Noted   Psychosis (HCC) 05/19/2023   Adjustment disorder with depressed mood 05/19/2023   Mild neurocognitive disorder 05/19/2023   HLD (hyperlipidemia) 04/29/2023   GERD without esophagitis 03/08/2021   Chronic idiopathic constipation 03/08/2021   Moderate episode of recurrent major depressive disorder (HCC) 08/15/2019   Generalized anxiety disorder 08/15/2019   Recurrent major depressive disorder, in partial remission (HCC) 03/24/2019   Generalized osteoarthritis of multiple sites 03/24/2019   Need for influenza vaccination 04/01/2018   Malignant melanoma of left temple (HCC) 11/26/2017   Chronic venous insufficiency 07/26/2015   Urge incontinence of urine 07/26/2015   Glaucoma 07/26/2015   Postmenopausal estrogen deficiency 07/26/2015   Primary open angle glaucoma 01/02/2014   Hyperglycemia 11/18/2012   Hypertriglyceridemia, essential 11/18/2012   Essential hypertension, benign 11/18/2012   Peripheral neuropathy 11/18/2012    Orientation RESPIRATION BLADDER Height & Weight     Self  Normal (Regular) Incontinent Weight:   Height:     BEHAVIORAL SYMPTOMS/MOOD NEUROLOGICAL BOWEL NUTRITION STATUS      Incontinent Diet (Regular)  AMBULATORY STATUS COMMUNICATION OF  NEEDS Skin   Limited Assist Verbally Normal                       Personal Care Assistance Level of Assistance  Bathing, Feeding, Dressing Bathing Assistance: Limited assistance Feeding assistance: Limited assistance Dressing Assistance: Limited assistance     Functional Limitations Info  Sight, Hearing, Speech Sight Info: Adequate Hearing Info: Adequate Speech Info: Adequate    SPECIAL CARE FACTORS FREQUENCY  PT (By licensed PT), OT (By licensed OT)     PT Frequency: x5/week OT Frequency: x5/week            Contractures Contractures Info: Not present    Additional Factors Info  Code Status, Allergies Code Status Info: Full Allergies Info: Penicillins           Current Medications (06/26/2023):  This is the current hospital active medication list Current Facility-Administered Medications  Medication Dose Route Frequency Provider Last Rate Last Admin   brimonidine (ALPHAGAN) 0.2 % ophthalmic solution 1 drop  1 drop Right Eye BID Rancour, Stephen, MD       dorzolamide-timolol (COSOPT) 2-0.5 % ophthalmic solution 1 drop  1 drop Right Eye BID Rancour, Stephen, MD       gabapentin (NEURONTIN) capsule 300 mg  300 mg Oral BID Rancour, Stephen, MD   300 mg at 06/26/23 1020   lisinopril (ZESTRIL) tablet 10 mg  10 mg Oral Daily Rancour, Stephen, MD   10 mg at 06/26/23 1020   Netarsudil-Latanoprost 0.02-0.005 % SOLN 1 drop  1 drop Right Eye QPM Rancour, Stephen, MD       pantoprazole (PROTONIX) EC tablet 40 mg  40 mg Oral  Daily Rancour, Jeannett Senior, MD   40 mg at 06/26/23 1020   QUEtiapine (SEROQUEL XR) 24 hr tablet 50 mg  50 mg Oral Daily Rancour, Stephen, MD       sertraline (ZOLOFT) tablet 100 mg  100 mg Oral QHS Rancour, Stephen, MD       Current Outpatient Medications  Medication Sig Dispense Refill   ADVIL 200 MG CAPS Take 200 mg by mouth every 8 (eight) hours as needed (for pain or headaches).     BIOTIN PO Take 1 capsule by mouth daily.     brimonidine (ALPHAGAN)  0.2 % ophthalmic solution Place 1 drop into the right eye 2 (two) times daily. 20 mL 2   dorzolamide-timolol (COSOPT) 2-0.5 % ophthalmic solution Place 1 drop into the right eye 2 (two) times daily. 30 mL 2   gabapentin (NEURONTIN) 300 MG capsule Take 1 capsule (300 mg total) by mouth 2 (two) times daily. (Patient taking differently: Take 300-600 mg by mouth See admin instructions. Take 300 mg by mouth in the morning and 300-600 mg by mouth at bedtime.) 180 capsule 1   lisinopril (ZESTRIL) 10 MG tablet Take 1 tablet (10 mg total) by mouth daily. 90 tablet 3   Netarsudil-Latanoprost (ROCKLATAN) 0.02-0.005 % SOLN Place 1 drop into the right eye every evening. 5 mL 2   omeprazole (PRILOSEC) 20 MG capsule Take 1 capsule (20 mg total) by mouth daily. 90 capsule 1   QUEtiapine (SEROQUEL XR) 50 MG TB24 24 hr tablet Take 1 tablet (50 mg total) by mouth daily. 90 tablet 2   sertraline (ZOLOFT) 100 MG tablet Take 1 tablet (100 mg total) by mouth at bedtime. 90 tablet 3     Discharge Medications: Please see discharge summary for a list of discharge medications.  Relevant Imaging Results:  Relevant Lab Results:   Additional Information [id number]  Princella Ion, LCSW

## 2023-06-26 NOTE — Progress Notes (Addendum)
CSW received a call from [employer] who informed pt was brought out in wheelchair; however, pt unable to transfer out of wheelchair and into vehicle. CSW spoke with RN who reported pt was brought back inside. PT will evaluate pt and make recommendations. CSW notified APS case worker, Primitivo Gauze, and has canceled transport at this time. EDP aware. PT has completed eval and recommending SNF. Pt to be faxed out. TOC following.

## 2023-06-26 NOTE — Consult Note (Addendum)
[facility] Consult Note  Patient Name: [name] MRN: [id number] DOB: 01/07/1927 DATE OF Consult: 06/26/2023  PRIMARY PSYCHIATRIC DIAGNOSES: Neurocognitive disorder major; Rule out delirium due to general medical condition; Unspecified depressive disorder    FORMULATION: Based on my current evaluation and assessment of the patient, she is a 89 y.o. female with fall and asserted intentional ingestion and suicidal ideations on presentation. However, on reassessment, the patient is denying suicidal and homicidal intent with plan and denies ever attempting suicide except for three years ago. Patient with severe deficits in her cognition and memory; she is oriented to self only. She is unable to recall the circumstances for this presentation to the ED and the risks, benefits, alternatives, and side effects of her current treatment. Given patient's demonstrated inability throughout this assessment to provide a cogent narrative as to the reason for this ED presentation, her overall goals of care, and the risks, benefits, alternatives and side effects of remaining in hospital care, patient appears to not have capacity at the time of this evaluation. Moreover, given her physical debility and vision impairment, would be very concerned about patient's ability to manage in an independent living setting. Would defer to physical and occupation therapy to determine the appropriate community setting that would best serve patient's cognitive and physical needs. Given primary team collateral that patient's lab work, vitals and overall presentation do not appear consistent with an acute intentional ingestion; patient's known history of dementia; and her denial of current suicidal and homicidal intent with plan, she is not at imminent risk to self or others at this time. The patient's presentation is consistent with Neurocognitive disorder major; Rule out delirium due to general medical condition; Unspecified  depressive disorder. Therefore, patient does not meet criteria for an intensive inpatient psychiatric hospitalization.  RECOMMENDATIONS   Medication recommendations:  Risks, benefits, side effects and alternatives to treatments reviewed:  -Continue home psychotropic regimen (recommend performing a medication reconciliation with patient's pharmacy to ascertain accuracy of reported regimen): would not titrate or change home regimen at this time as it is unclear how adherent patient has been with her medications  As needed medications to manage patient's acute symptoms while in hospital care:  -Consider melatonin 3 mg at bedtime as needed for insomnia. May give patient an additional melatonin 3 mg if patient continues to have insomnia an hour following initial dose to prevent sundowning, or worsening confusion associated with poor, non-restorative sleep.  As needed medications to manage patient's acute symptoms while in hospital care:   -Maximize utilization of verbal de-escalation techniques, if attempts are unsuccessful and patient poses a threat to self and others: Consider olanzapine (Zyprexa) 2.5 mg PO/IM every 8 hours as needed for severe agitation. Would offer patient the option of taking PO medication first, but if patient refuses then may administer IM medication as a last resort. Would not exceed 10 mg of olanzapine within a 24-hour period. Avoid co-administering intramuscular olanzapine with intravenous benzodiazepine, as giving both medications concurrently is associated with respiratory depression.   Non-Medication recommendations:  -Consider consultation with social services, care management and/or ethics/legal team to discuss with patient and next of kin regarding assigning medical power of attorney and/or guardianship given medical decision-making concerns at this time, especially if patient continues to be unable to meaningfully engage in treatment planning in the future  -Appreciate  Physical and Occupational Therapy assessments to determine extent of functional and cognitive deficits to help appropriately place patient in setting that meets her physical and cognitive  needs  -Note: Please stop all antipsychotic and QTc prolonging medications if patient's QTc is greater than 480 ms (except for aripiprazole, which demonstrates no effect to reduction of QTc per case reports and literature review). Of note, to decrease the risk of prolonged QTc, please maintain potassium and magnesium levels within normal ranges. -Agree with work up for organic causes of altered mentation and mood dysregulation, consider the following if not already performed: CT of head; CBC and differential, basic metabolic profile, liver function tests (if abnormal consider ammonia level), urinalysis, urine toxicology screen, vitamin B12 level, vitamin D level, TSH with reflex free T4  -Recommend continued delirium precautions to include frequent reorientation; maintain patient in an appropriately lighted and darkened room to correspond with day and night time (a room with a window is preferred); fall precautions; frequent evaluation of patient's prescribed medications and eliminating unnecessary or redundant medications if clinically appropriate; ascertaining that patient has access to glasses and hearing aids; and limiting anticholinergic medications, opioid analgesics and benzodiazepines if clinically appropriate to minimize iatrogenic contributors to delirium   Observation recommendations:  If agitated, recommend 1:1 observation  Is inpatient psychiatric hospitalization recommended for this patient? No (Explain why): patient is not at imminent risk to self or others at this time   Follow-Up Telepsychiatry C/L services: We will continue to follow this patient with you until stabilized or discharged.  If you have any questions or concerns, please call our TeleCare Coordination service at  [phone] and ask for  myself or the provider on-call.  Communication: Treatment team members (and family members if applicable) who were involved in treatment/care discussions and planning, and with whom we spoke or engaged with via secure text/chat, include the following: primary provider   Thank you for involving [place] in the care of this patient. If you have any additional questions or concerns, please call [phone] and ask for me or the provider on-call.  Total time spent in this encounter was 60 minutes with greater than 50% of time spent in counseling and coordination of care.  TELEPSYCHIATRY ATTESTATION & CONSENT  As the provider for this telehealth consult, I attest that I verified the patient's identity using two separate identifiers, introduced myself to the patient, provided my credentials, disclosed my location, and performed this encounter via a HIPAA-compliant, real-time, face-to-face, two-way, interactive audio and video platform and with the full consent and agreement of the patient (or guardian as applicable.)  [place]. Telehealth provider physical location: home office in state of [place].  Video start time: 2230 (Central Time) Video end time: 2245 (Central Time)  IDENTIFYING DATA  [name] is a 89 y.o. year-old female for whom a psychiatric consultation has been ordered by the primary provider. The patient was identified using two separate identifiers.  CHIEF COMPLAINT/REASON FOR CONSULT  Suicidal ideations   HISTORY OF PRESENT ILLNESS (HPI)  I evaluated the patient today face-to-face via secure, HIPAA-compliant telepsychiatric connection, and at the request of the primary treatment team. The reason for the telepsychiatric consultation is that the patient is a 89 year old female with a history of essential benign hypertension, postmenopausal estrogen deficiency, hyperlipidemia, GERD without esophagitis, primary open angle glaucoma, malignant melanoma of left temple, major  depressive disorder, generalized anxiety disorder, neurocognitive disorder who presents for psychiatric evaluation following reported suicide attempt via intentional ingestion with suicidal ideations. Per primary team report, given the patient's labs, vitals and inconsistent history, there is concern that patient may not have taken an intentional ingestion.  However, given the patient's vision impairment, there is significant concern that the patient cannot manage her cares within an independent living setting. Primary team is seeking psychotropic medication recommendations, safety evaluation to determine appropriateness for more intensive psychiatric services and diagnostic clarity as to the patient's presentation.   During one-on-one evaluation with this provider, patient was alert and oriented to self but not to time, location or situation. The patient did not appear to be overtly inappropriately internally preoccupied; patient's thought process was disorganized given the severity of her cognitive and memory deficits. Patient related that she believes that she is currently in the [address]", which is the independent living facility that she has been residing for some time. Patient reported that despite understanding that she is "legally blind", the patient insists that she can continue living in an independent living setting. She reports that her blindness is not affecting her ability to engage in her self-care. Patient would often interrupt this provider and ask repeatedly how this provider was doing. Patient denies current suicidal and homicidal intent with plan; she is future oriented to continue living at the [facility].   PAST PSYCHIATRIC HISTORY  Inpatient psychiatric treatment: per patient, denies  Outpatient mental health treatment: per patient, denies   Current home psychotropic medications: per chart documentation, quetiapine XR 50 mg daily for mood stabilization, sertraline 100 mg daily for anxiety and  depression  Suicide attempts: per chart documentation, patient asserted that she had recently taken an intentional ingestion ; however, during this evaluation patient asserted that about 3 years ago she attempted suicide via taking an overdose  Trauma history: patient did not assert current abuse/exploitation  Otherwise as per HPI above.  PAST MEDICAL HISTORY  Past Medical History:  Diagnosis Date   Arthritis    Osteoarthritis   Depression    GERD (gastroesophageal reflux disease)    Hyperlipidemia    Osteopenia      HOME MEDICATIONS  Facility Ordered Medications  Medication   [COMPLETED] OLANZapine (ZYPREXA) tablet 5 mg   [COMPLETED] acetaminophen (TYLENOL) tablet 650 mg   PTA Medications  Medication Sig   BIOTIN PO Take 1 capsule by mouth daily.   Netarsudil-Latanoprost (ROCKLATAN) 0.02-0.005 % SOLN Place 1 drop into the right eye every evening.   brimonidine (ALPHAGAN) 0.2 % ophthalmic solution Place 1 drop into the right eye 2 (two) times daily.   dorzolamide-timolol (COSOPT) 2-0.5 % ophthalmic solution Place 1 drop into the right eye 2 (two) times daily.   ADVIL 200 MG CAPS Take 200 mg by mouth every 8 (eight) hours as needed (for pain or headaches).   sertraline (ZOLOFT) 100 MG tablet Take 1 tablet (100 mg total) by mouth at bedtime.   gabapentin (NEURONTIN) 300 MG capsule Take 1 capsule (300 mg total) by mouth 2 (two) times daily. (Patient taking differently: Take 300-600 mg by mouth See admin instructions. Take 300 mg by mouth in the morning and 300-600 mg by mouth at bedtime.)   omeprazole (PRILOSEC) 20 MG capsule Take 1 capsule (20 mg total) by mouth daily.   QUEtiapine (SEROQUEL XR) 50 MG TB24 24 hr tablet Take 1 tablet (50 mg total) by mouth daily.   lisinopril (ZESTRIL) 10 MG tablet Take 1 tablet (10 mg total) by mouth daily.    ALLERGIES  Allergies  Allergen Reactions   Penicillins Rash    SOCIAL & SUBSTANCE USE HISTORY  Social History   Socioeconomic History    Marital status: Divorced    Spouse name: Not on file  Number of children: 2   Years of education: Not on file   Highest education level: Not on file  Occupational History   Not on file  Tobacco Use   Smoking status: Former   Smokeless tobacco: Never   Tobacco comments:    Quit at age 36   Vaping Use   Vaping status: Never Used  Substance and Sexual Activity   Alcohol use: Not Currently    Comment: Occasionally    Drug use: No   Sexual activity: Not Currently  Other Topics Concern   Not on file  Social History Narrative   Not on file   Social Determinants of Health   Financial Resource Strain: Low Risk  (03/29/2018)   Overall Financial Resource Strain (CARDIA)    Difficulty of Paying Living Expenses: Not hard at all  Food Insecurity: No Food Insecurity (01/02/2023)   Hunger Vital Sign    Worried About Running Out of Food in the Last Year: Never true    Ran Out of Food in the Last Year: Never true  Transportation Needs: No Transportation Needs (01/02/2023)   PRAPARE - Administrator, Civil Service (Medical): No    Lack of Transportation (Non-Medical): No  Physical Activity: Inactive (03/29/2018)   Exercise Vital Sign    Days of Exercise per Week: 0 days    Minutes of Exercise per Session: 0 min  Stress: No Stress Concern Present (03/29/2018)   [employer] of Occupational Health - Occupational Stress Questionnaire    Feeling of Stress : Only a little  Social Connections: Moderately Isolated (03/29/2018)   Social Connection and Isolation Panel [NHANES]    Frequency of Communication with Friends and Family: More than three times a week    Frequency of Social Gatherings with Friends and Family: More than three times a week    Attends Religious Services: Never    Database administrator or Organizations: No    Attends Banker Meetings: Never    Marital Status: Widowed   Social History   Tobacco Use  Smoking Status Former  Smokeless Tobacco  Never  Tobacco Comments   Quit at age 50    Social History   Substance and Sexual Activity  Alcohol Use Not Currently   Comment: Occasionally    Social History   Substance and Sexual Activity  Drug Use No    Additional pertinent information none disclosed .  FAMILY HISTORY  History reviewed. No pertinent family history. Family Psychiatric History (if known):  none disclosed  MENTAL STATUS EXAM (MSE)  Presentation  General Appearance:  Appropriate for Environment  Eye Contact: Fleeting  Speech: Clear and Coherent  Speech Volume: Normal  Handedness: Right   Mood and Affect  Mood: Euthymic  Affect: Congruent   Thought Process  Thought Processes: Disorganized  Descriptions of Associations: Loose  Orientation: Other (comment) (to self only)  Thought Content: Illogical  History of Schizophrenia/Schizoaffective disorder: No  Duration of Psychotic Symptoms: N/A  Hallucinations: Hallucinations: None  Ideas of Reference: None  Suicidal Thoughts: Suicidal Thoughts: No  Homicidal Thoughts: Homicidal Thoughts: No   Sensorium  Memory: Immediate Poor; Recent Poor; Remote Poor  Judgment: Poor  Insight: Poor   Executive Functions  Concentration: Poor  Attention Span: Poor  Recall: Poor  Fund of Knowledge: Poor  Language: Poor   Psychomotor Activity  Psychomotor Activity: Psychomotor Activity: Restlessness   Assets  Assets: Social Support   Sleep  Sleep: Sleep: Poor   VITALS  Blood pressure 127/68, pulse (!) 58, temperature 97.7 F (36.5 C), temperature source Oral, resp. rate 16, SpO2 100%.  LABS  Admission on 06/25/2023  Component Date Value Ref Range Status   Sodium 06/25/2023 138  135 - 145 mmol/L Final   Potassium 06/25/2023 4.2  3.5 - 5.1 mmol/L Final   Chloride 06/25/2023 103  98 - 111 mmol/L Final   CO2 06/25/2023 26  22 - 32 mmol/L Final   Glucose, Bld 06/25/2023 107 (H)  70 - 99 mg/dL Final    Glucose reference range applies only to samples taken after fasting for at least 8 hours.   BUN 06/25/2023 30 (H)  8 - 23 mg/dL Final   Creatinine, Ser 06/25/2023 0.69  0.44 - 1.00 mg/dL Final   Calcium 96/29/5284 8.9  8.9 - 10.3 mg/dL Final   Total Protein 13/24/4010 6.1 (L)  6.5 - 8.1 g/dL Final   Albumin 27/25/3664 3.6  3.5 - 5.0 g/dL Final   AST 40/34/7425 18  15 - 41 U/L Final   ALT 06/25/2023 15  0 - 44 U/L Final   Alkaline Phosphatase 06/25/2023 58  38 - 126 U/L Final   Total Bilirubin 06/25/2023 0.4  <1.2 mg/dL Final   GFR, Estimated 06/25/2023 >60  >60 mL/min Final   Comment: (NOTE) Calculated using the CKD-EPI Creatinine Equation (2021)    Anion gap 06/25/2023 9  5 - 15 Final   Performed at [facility], [address], [place], [place] [address]   Alcohol, Ethyl (B) 06/25/2023 <10  <10 mg/dL Final   Comment: (NOTE) Lowest detectable limit for serum alcohol is 10 mg/dL.  For medical purposes only. Performed at [facility], [address], [place], [place] [address]    Salicylate Lvl 06/25/2023 <7.0 (L)  7.0 - 30.0 mg/dL Final   Performed at [facility], [address] [place], [place] [address]   Acetaminophen (Tylenol), Serum 06/25/2023 <10 (L)  10 - 30 ug/mL Final   Comment: (NOTE) Therapeutic concentrations vary significantly. A range of 10-30 ug/mL  may be an effective concentration for many patients. However, some  are best treated at concentrations outside of this range. Acetaminophen concentrations >150 ug/mL at 4 hours after ingestion  and >50 ug/mL at 12 hours after ingestion are often associated with  toxic reactions.  Performed at [facility], [address], [place], [place] [address]    WBC 06/25/2023 8.8  4.0 - 10.5 K/uL Final   RBC 06/25/2023 4.29  3.87 - 5.11 MIL/uL Final   Hemoglobin 06/25/2023 12.7  12.0 - 15.0 g/dL Final   HCT 60/63/0160 40.2  36.0 - 46.0 % Final    MCV 06/25/2023 93.7  80.0 - 100.0 fL Final   MCH 06/25/2023 29.6  26.0 - 34.0 pg Final   MCHC 06/25/2023 31.6  30.0 - 36.0 g/dL Final   RDW 10/93/2355 13.8  11.5 - 15.5 % Final   Platelets 06/25/2023 187  150 - 400 K/uL Final   nRBC 06/25/2023 0.0  0.0 - 0.2 % Final   Performed at [facility], [address] [place], [place] [address]   Opiates 06/25/2023 NONE DETECTED  NONE DETECTED Final   Cocaine 06/25/2023 NONE DETECTED  NONE DETECTED Final   Benzodiazepines 06/25/2023 NONE DETECTED  NONE DETECTED Final   Amphetamines 06/25/2023 NONE DETECTED  NONE DETECTED Final   Tetrahydrocannabinol 06/25/2023 NONE DETECTED  NONE DETECTED Final   Barbiturates 06/25/2023 NONE DETECTED  NONE DETECTED Final   Comment: (NOTE) DRUG SCREEN FOR MEDICAL PURPOSES ONLY.  IF CONFIRMATION IS NEEDED FOR ANY PURPOSE, NOTIFY LAB WITHIN 5 DAYS.  LOWEST DETECTABLE LIMITS FOR URINE DRUG SCREEN Drug Class                     Cutoff (ng/mL) Amphetamine and metabolites    1000 Barbiturate and metabolites    200 Benzodiazepine                 200 Opiates and metabolites        300 Cocaine and metabolites        300 THC                            50 Performed at [facility], [address], [place], [place] [address]     PSYCHIATRIC REVIEW OF SYSTEMS (ROS)  ROS: Notable for the following relevant positive findings: Review of Systems  Psychiatric/Behavioral:  Positive for memory loss. Negative for depression, hallucinations, substance abuse and suicidal ideas. The patient is not nervous/anxious and does not have insomnia.     Additional findings:      Musculoskeletal: No abnormal movements observed      Gait & Station: Laying/Sitting      Pain Screening: Denies      Nutrition & Dental Concerns: No concerns voiced   RISK FORMULATION/ASSESSMENT  Is the patient experiencing any suicidal or homicidal ideations: yes. On presentation, patient asserting suicide attempt via  intentional ingestion and suicidal ideations in context of fall; however, during this assessment patient denies suicidal and homicidal intent with plan and is future oriented to return home   Protective factors considered for safety management: current care in a highly monitored health care setting  Risk factors/concerns considered for safety management:  Depression Physical illness/chronic pain Age over 32 Impulsivity  Is there a safety management plan with the patient and treatment team to minimize risk factors and promote protective factors: Yes           Explain: continued multidisciplinary management of psychosocial, medical and mental health needs  Is crisis care placement or psychiatric hospitalization recommended: No     Based on my current evaluation and risk assessment, patient is determined at this time to be at:  Moderate Risk  *RISK ASSESSMENT Risk assessment is a dynamic process; it is possible that this patient's condition, and risk level, may change. This should be re-evaluated and managed over time as appropriate. Please re-consult psychiatric consult services if additional assistance is needed in terms of risk assessment and management. If your team decides to discharge this patient, please advise the patient how to best access emergency psychiatric services, or to call 911, if their condition worsens or they feel unsafe in any way.   Rodena Medin, MD Telepsychiatry Consult Services

## 2023-06-26 NOTE — Patient Outreach (Signed)
Care Coordination   Case Collaboration   Visit Note   06/26/2023 Name: [name] MRN: [id number] DOB: Dec 14, 1926  [name] is a 89 y.o. year old female who sees Venita Sheffield, MD for primary care. I collaborated with Sharlee Blew SW w/APS and Agustin Cree SW with [facility] today regarding patient's plan of care.   What matters to the patients health and wellness today?  NA    Goals Addressed             This Visit's Progress    RN Care Coordination Activities: further follow up needed       Care Coordination Interventions: Inbound call received from [facility] care ambassador with [facility], patient's place of residence Determined patient experienced a fall while in her apartment this morning for which EMS was called Discussed patient stated to the care ambassador, "I do not want to be her anymore", she proceeded to ask if the care ambassador had any pills she would giving her Discussed patient was transported to [facility] for further evaluation  Discussed [name]'s concerns that patient may be discharged home without receiving adequate psych evaluation and patient has expressed suicidal ideation with a plan as she asked her ambassador for "pills" Determined patient was unsafely discharged to her apartment following her last admission due to patient's care ambassador was not informed of the discharge and patient had no food in her home and the apartment had unsanitary conditions Determined patient is not taking her medications as prescribed due to difficulty remembering, she often drops her pills on the floor as she is visually impaired for which she often misses doses of her prescribed eye drops Discussed [name] contacted Camilla S. Assigned SW with APS and notified her of today's events and patient's transfer to [facility] ED Collaboration with Charlesetta Shanks RN Population Health hospital liaison to inform her patient's ED visit and the above stated  concerns  Determined if patient is admitted, [place] can follow this patient and offer collaboration to the health care team  Collaborated with Clinical Supervisor Marja Kays regarding patient's level of care concerns by [facility], advised of patient's multiple falls and the inability for her to care for herself, advised of APS involvement and patient's ineligibility to be placed at this time, advised of patient's suicidal ideation and noted attempt per hospital MD that patient took 11 pills last evening of which she does not know she took in effort to kill herself  Placed call to [facility] SW supervisor in order to provide patient history and APS involvement as well as patient's unsafe discharge following her last admission, left a detailed voice message with this RN's contact information and requested a return call as soon as possible Placed outbound call to Primitivo Gauze SW with APS, left a detailed voice message introducing myself and providing contact number for a return call as soon as possible to discuss this patient's health status Noted ED plan of care;   Nursing contacted poison control who recommend 24-hour observation from time of ingestion and typical poison control labs including Tylenol and aspirin level.  Patient reports that she ingested these substances around 2-3 PM so we will observe until around 2-3 PM today.  Anticipate patient will need psychiatric consult following this.  Received return call from [employer] SW with APS, determined a protective service order has been set into the place by the [facility] judge allowing APS to approve placement for  patient into a skilled nursing facility Collaborated with Agustin Cree SW with [facility], discussed patient remains to be at [facility] for which Judi Saa SW is working to find patient a bed an acute rehab nursing home bed, so far no beds are available, discussed  patient has been cleared by psych Advised of concerns that patient may be unsafely discharged if bed placement is not found and elaborated on patient's current living situation at [facility] Determined patient is not expected to discharge over the weekend         SDOH assessments and interventions completed:  No     Care Coordination Interventions:  Yes, provided   Follow up plan: Follow up call pending upon patient's discharge  Encounter Outcome:  Patient Visit Completed

## 2023-06-27 NOTE — ED Provider Notes (Addendum)
Emergency Medicine Observation Re-evaluation Note  [name] is a 89 y.o. female, seen on rounds today.  Pt initially presented to the ED for complaints of Fall (/) and Suicidal Currently, the patient is not having any complaints.  Physical Exam  BP (!) 176/72 (BP Location: Right Arm)   Pulse 64   Temp (!) 97.5 F (36.4 C) (Axillary)   Resp 18   SpO2 100%  Physical Exam General: Resting comfortably Lungs: Normal work of breathing Psych: Calm and cooperative  ED Course / MDM  EKG:EKG Interpretation Date/Time:  Thursday June 25 2023 22:35:40 EST Ventricular Rate:  72 PR Interval:  248 QRS Duration:  88 QT Interval:  406 QTC Calculation: 444 R Axis:   -22  Text Interpretation: Sinus rhythm with 1st degree A-V block with Premature supraventricular complexes Anteroseptal infarct , age undetermined Abnormal ECG Confirmed by Tilden Fossa [phone]) on 06/26/2023 3:22:51 PM  I have reviewed the labs performed to date as well as medications administered while in observation.  Recent changes in the last 24 hours include no new developments.  Plan  Current plan is for placement.  Home med rec ordered    Rondel Baton, MD 06/27/23 1712    Rondel Baton, MD 06/27/23 [phone]

## 2023-06-27 NOTE — ED Notes (Signed)
Patient slept comfortably throughout shift.

## 2023-06-28 NOTE — ED Notes (Signed)
Patient resting comfortably at this time.

## 2023-06-28 NOTE — ED Provider Notes (Signed)
Emergency Medicine Observation Re-evaluation Note  [name] is a 89 y.o. female, seen on rounds today.  Pt initially presented to the ED for complaints of Fall (/) and Suicidal Currently, the patient is resting.  Physical Exam  BP (!) 118/57 (BP Location: Left Arm) Comment: RN notified  Pulse 63   Temp 97.6 F (36.4 C) (Oral)   Resp 18   SpO2 99%  Physical Exam Neurological:     Mental Status: She is alert.      ED Course / MDM  EKG:EKG Interpretation Date/Time:  Thursday June 25 2023 22:35:40 EST Ventricular Rate:  72 PR Interval:  248 QRS Duration:  88 QT Interval:  406 QTC Calculation: 444 R Axis:   -22  Text Interpretation: Sinus rhythm with 1st degree A-V block with Premature supraventricular complexes Anteroseptal infarct , age undetermined Abnormal ECG Confirmed by Tilden Fossa [phone]) on 06/26/2023 3:22:51 PM  I have reviewed the labs performed to date as well as medications administered while in observation.  Recent changes in the last 24 hours include nothing.  Plan  Current plan is for placement.    Virgina Norfolk, DO 06/28/23 [phone]

## 2023-06-29 ENCOUNTER — Emergency Department (HOSPITAL_COMMUNITY): Payer: Medicare Other

## 2023-06-29 ENCOUNTER — Ambulatory Visit: Payer: Self-pay

## 2023-06-29 DIAGNOSIS — I959 Hypotension, unspecified: Secondary | ICD-10-CM | POA: Diagnosis not present

## 2023-06-29 DIAGNOSIS — R9389 Abnormal findings on diagnostic imaging of other specified body structures: Secondary | ICD-10-CM | POA: Diagnosis not present

## 2023-06-29 DIAGNOSIS — J9811 Atelectasis: Secondary | ICD-10-CM | POA: Diagnosis not present

## 2023-06-29 DIAGNOSIS — I700 Atherosclerosis of aorta: Secondary | ICD-10-CM | POA: Diagnosis not present

## 2023-06-29 LAB — BASIC METABOLIC PANEL
Anion gap: 9 (ref 5–15)
BUN: 45 mg/dL — ABNORMAL HIGH (ref 8–23)
CO2: 24 mmol/L (ref 22–32)
Calcium: 9 mg/dL (ref 8.9–10.3)
Chloride: 103 mmol/L (ref 98–111)
Creatinine, Ser: 0.96 mg/dL (ref 0.44–1.00)
GFR, Estimated: 55 mL/min — ABNORMAL LOW (ref >60)
Glucose, Bld: 393 mg/dL — ABNORMAL HIGH (ref 70–99)
Potassium: 3.9 mmol/L (ref 3.5–5.1)
Sodium: 136 mmol/L (ref 135–145)

## 2023-06-29 LAB — CBC WITH DIFFERENTIAL/PLATELET
Abs Immature Granulocytes: 0.01 10*3/uL (ref 0.00–0.07)
Basophils Absolute: 0 10*3/uL (ref 0.0–0.1)
Basophils Relative: 0 %
Eosinophils Absolute: 0 10*3/uL (ref 0.0–0.5)
Eosinophils Relative: 1 %
HCT: 41.3 % (ref 36.0–46.0)
Hemoglobin: 12.7 g/dL (ref 12.0–15.0)
Immature Granulocytes: 0 %
Lymphocytes Relative: 26 %
Lymphs Abs: 1.3 10*3/uL (ref 0.7–4.0)
MCH: 29.4 pg (ref 26.0–34.0)
MCHC: 30.8 g/dL (ref 30.0–36.0)
MCV: 95.6 fL (ref 80.0–100.0)
Monocytes Absolute: 0.4 10*3/uL (ref 0.1–1.0)
Monocytes Relative: 7 %
Neutro Abs: 3.3 10*3/uL (ref 1.7–7.7)
Neutrophils Relative %: 66 %
Platelets: 171 10*3/uL (ref 150–400)
RBC: 4.32 MIL/uL (ref 3.87–5.11)
RDW: 13.4 % (ref 11.5–15.5)
WBC: 5 10*3/uL (ref 4.0–10.5)
nRBC: 0 % (ref 0.0–0.2)

## 2023-06-29 LAB — I-STAT CG4 LACTIC ACID, ED: Lactic Acid, Venous: 2.4 mmol/L (ref 0.5–1.9)

## 2023-06-29 NOTE — Progress Notes (Signed)
CSW has outreached to [place] who is still reviewing pt.

## 2023-06-29 NOTE — ED Notes (Signed)
BP attempted mult times. Systolic in 60s. Palpated at 66. Both arms checked

## 2023-06-29 NOTE — ED Notes (Signed)
Upon vital sign reassessment pt BP was low  Manual BP rechecked with charge nurse and got readings that corollate with auto BP, Pt to be moved back to ER floor for stabilization

## 2023-06-29 NOTE — ED Notes (Signed)
RN notified techs that pt is visually impaired & can only see shadows

## 2023-06-29 NOTE — Patient Outreach (Addendum)
  Care Coordination   Follow Up Visit Note   06/29/2023 Name: [name] MRN: [id number] DOB: 07-25-1927  [name] is a 89 y.o. year old female who sees Venita Sheffield, MD for primary care. I spoke with care ambassador [name] SW with [facility] by phone today.  What matters to the patients health and wellness today?  N/a    Goals Addressed             This Visit's Progress    RN Care Coordination Activities: further follow up needed       Care Coordination Interventions: Reviewed chart noted patient remains to be at [facility] Emergency Department with plans for placement Placed outbound call to [facility] care ambassador with [facility], provided patient status update Placed outbound call to [employer] APS SW, left a detailed message with a patient status update, provided contact number for this RN for a return call      Interventions Today    Flowsheet Row Most Recent Value  General Interventions   General Interventions Discussed/Reviewed Communication with  Communication with Social Work  [facility] for [facility],  Sharlee Blew APS SW]          SDOH assessments and interventions completed:  No     Care Coordination Interventions:  Yes, provided   Follow up plan: Follow up call scheduled for 07/07/23 @2 :30 PM    Encounter Outcome:  Patient Visit Completed

## 2023-06-29 NOTE — ED Notes (Signed)
Pt unable to ambulate, unsteady on feet

## 2023-06-29 NOTE — ED Notes (Signed)
Patient refused being stuck again for blood work or IV.

## 2023-06-29 NOTE — ED Provider Notes (Signed)
4:13 PM  I was asked see the patient for concern for hypotension.  Patient has been boarding in the emergency department for 103 hours.  She is currently awaiting psychiatric placement.  On my review of the vital signs in the emergency department she does have some low readings.  On my exam the patient has no complaints.  She denies feeling lightheaded or dizzy.  She has a very small upper extremities, the small adult cuff is wrapped multiple times around her upper arm.  I wonder if this is the cause of her low pressure readings.  Will let her eat and drink here.  Will try to find a more appropriate blood pressure cuff.  Despite fluids the patient continues to have low blood pressure readings.  Has been 4 days since she had blood work drawn we will repeat today to assess for any acute change.  Chest x-ray and UA to screen for infection.  Lactate.  Lactate is mildly elevated at 2.4.  Her blood pressure continued to be a bit low and I had discussed with her about starting an arterial line.  She is declining.  Continues to be asymptomatic.  Offered admission which she is also declining.  Repeat blood work with the small bump in her renal function.  No anemia, no leukocytosis.  Chest x-ray independently interpreted by me without pneumothorax.  She has possible left lower lobe infiltrate versus atelectasis on my independent interpretation.  No cough no shortness of breath per her.  Blood pressure has improved.  She has been eating and drinking here normally.  Seems a bit hungry like maybe she had missed a meal or 2.  Will hold her blood pressure medicines and her eyedrops.       Melene Plan, DO 06/29/23 2234

## 2023-06-29 NOTE — ED Provider Notes (Signed)
Emergency Medicine Observation Re-evaluation Note  [name] is a 89 y.o. female, seen on rounds today.  Pt initially presented to the ED for complaints of Fall (/) and Suicidal Currently, the patient is sleeping.  Physical Exam  BP 118/61 (BP Location: Right Arm)   Pulse 73   Temp 98 F (36.7 C) (Oral)   Resp 18   SpO2 100%  Physical Exam General: calm, sleeping Cardiac: well-perfused Lungs: no resp distress Psych: calm, sleeping  ED Course / MDM  EKG:EKG Interpretation Date/Time:  Thursday June 25 2023 22:35:40 EST Ventricular Rate:  72 PR Interval:  248 QRS Duration:  88 QT Interval:  406 QTC Calculation: 444 R Axis:   -22  Text Interpretation: Sinus rhythm with 1st degree A-V block with Premature supraventricular complexes Anteroseptal infarct , age undetermined Abnormal ECG Confirmed by Tilden Fossa [phone]) on 06/26/2023 3:22:51 PM  I have reviewed the labs performed to date as well as medications administered while in observation.  Recent changes in the last 24 hours include none.  Plan  Current plan is for placement.    Loetta Rough, MD 06/29/23 [phone]

## 2023-06-30 ENCOUNTER — Ambulatory Visit: Payer: Self-pay

## 2023-06-30 ENCOUNTER — Encounter: Payer: Self-pay | Admitting: Licensed Clinical Social Worker

## 2023-06-30 ENCOUNTER — Encounter (HOSPITAL_COMMUNITY): Payer: Self-pay

## 2023-06-30 LAB — I-STAT CG4 LACTIC ACID, ED: Lactic Acid, Venous: 0.9 mmol/L (ref 0.5–1.9)

## 2023-06-30 MED ORDER — LORAZEPAM 2 MG/ML IJ SOLN
0.5000 mg | Freq: Once | INTRAMUSCULAR | Status: AC
Start: 2023-06-30 — End: 2023-06-30
  Administered 2023-06-30: 0.5 mg via INTRAVENOUS
  Filled 2023-06-30: qty 1

## 2023-06-30 NOTE — Progress Notes (Signed)
Physical Therapy Treatment Patient Details Name: [name] MRN: [id number] DOB: [birth year] Today's Date: 06/30/2023   History of Present Illness [name] is a 89 y.o. female presents with fall and suicidal ideation. PMH: depression, HTN, hyperlipidemia, osteopenia, GERD    PT Comments   Patient seen again today to attempt ambulation with 2 person. Patient continues to demonstrate poor mobility,  unable to safely stand and ambulate,  continue PT as  tolerated.    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 Restrictions Weight Bearing Restrictions: No     Mobility  Bed Mobility Overal bed mobility: Needs Assistance Bed Mobility: Supine to Sit, Sit to Supine     Supine to sit: Max assist Sit to supine: Max assist   General bed mobility comments: patient   demonstrates posterior lean and pushes back, assist with legs and trunk    Transfers Overall transfer level: Needs assistance Equipment used: Rolling walker (2 wheels) Transfers: Sit to/from Stand Sit to Stand: Max assist, +2 physical assistance, +2 safety/equipment           General transfer comment: patient requiring significant support to stand, feet sliding forward, poster push. Stood briefly, unable to take a step with balance loss and posterior bias. Wide base and feet sliding. Stood with 2 max, unable to take a step due to poor leg control    Ambulation/Gait                   Stairs             Wheelchair Mobility     Tilt Bed    Modified Rankin (Stroke Patients Only)        Balance Overall balance assessment: Needs assistance Sitting-balance support: Bilateral upper extremity supported, Feet unsupported Sitting balance-Leahy Scale: Poor     Standing balance support: Bilateral upper extremity supported, Reliant on assistive device for balance, During functional activity Standing balance-Leahy Scale: Zero                              Cognition Arousal: Alert Behavior During Therapy: WFL for tasks assessed/performed Overall Cognitive Status: No family/caregiver present to determine baseline cognitive functioning                                 General Comments: follows directions, wants to walk        Exercises      General Comments        Pertinent Vitals/Pain Pain Assessment Faces Pain Scale: Hurts little more Pain Location: left shoulder and back Pain Descriptors / Indicators: Aching, Discomfort, Grimacing Pain Intervention(s): Monitored during session    Home Living                          Prior Function            PT Goals (current goals can now be found in the care plan section) Progress towards PT goals:  Progressing toward goals    Frequency    Min 1X/week      PT Plan      Co-evaluation              AM-PAC PT "6 Clicks" Mobility   Outcome Measure  Help needed turning from your back to your side while in a flat bed without using bedrails?: A Lot Help needed moving from lying on your back to sitting on the side of a flat bed without using bedrails?: Total Help needed moving to and from a bed to a chair (including a wheelchair)?: Total Help needed standing up from a chair using your arms (e.g., wheelchair or bedside chair)?: Total Help needed to walk in hospital room?: Total Help needed climbing 3-5 steps with a railing? : Total 6 Click Score: 7    End of Session Equipment Utilized During Treatment: Gait belt Activity Tolerance: Patient tolerated treatment  well Patient left: in bed;with call bell/phone within reach;with nursing/sitter in room (tele sitter) Nurse Communication: Mobility status PT Visit Diagnosis: Unsteadiness on feet (R26.81);History of falling (Z91.81);Muscle weakness (generalized) (M62.81);Difficulty in walking, not elsewhere classified (R26.2)     Time: 8119-1478 PT Time Calculation (min) (ACUTE ONLY): 16 min  Charges:    $Therapeutic Activity: 8-22 mins PT General Charges $$ ACUTE PT VISIT: 1 Visit                     Blanchard Kelch PT Acute Rehabilitation Services Office [phone] Weekend [phone]    Rada Hay 06/30/2023, 5:00 PM

## 2023-06-30 NOTE — ED Notes (Signed)
Pt informed nurse that she no longer have the thought of wanting to harm herself. She reports speaking with the social worker and informing them of the same thing.

## 2023-06-30 NOTE — ED Notes (Signed)
Pt trying to climb out of bed, pt redirected back to bed and pulled up in the bed. Pt started screaming "help me" "help me"  Pt tried to kick this RN, then apologized for it.

## 2023-06-30 NOTE — ED Notes (Signed)
Pt still refusing to allow this nurse to obtain remaining lab work at this time. Provider aware.

## 2023-06-30 NOTE — Patient Outreach (Addendum)
  Care Coordination   Collaboration  Visit Note   06/30/2023 Name: [name] MRN: [id number] DOB: 1927/03/30  [name] is a 89 y.o. year old female who sees Venita Sheffield, MD for primary care. I  engaged with RNCM Angel Little and TOC Supervisor, Sharol Roussel  What matters to the patients health and wellness today?  Patient was not engaged during this encounter    Goals Addressed             This Visit's Progress    LCSW-Obtain Supportive Resources   On track    Activities and task to complete in order to accomplish goals.   Continue working with [facility] care team to assist with goals identified Follow up with APS to assist with promotion of safety and well-being. Pt's APS CM, Camila, can be contacted at [phone] [email or web address]           SDOH assessments and interventions completed:  No     Care Coordination Interventions:  Yes, provided  Interventions Today    Flowsheet Row Most Recent Value  Chronic Disease   Chronic disease during today's visit Diabetes, Other  [MDD and GAD]  General Interventions   General Interventions Discussed/Reviewed Communication with, Level of Care, General Interventions Reviewed  [While at hospital, pt's PT evaluation suggests deconditioning. TOC Supervisor agreed to have PT f/up with patient. Patient will not be sent home today,  however, she is not currently admitted. Placement barriers discussed]  Communication with RN  [employer] collaborated with [employer] Supervisor, Sharol Roussel regarding patient care needs/barriers. LCSW was informed patient will not be admitted due to no current medical need.]  Level of Care Applications  [TOC Supervisor and RNCM has collaborated with APS CM regarding placement barriers. Per [facility] Supervisor, APS visited pt today in ED. TOC has reached out to multiple SNF, which have all declined placement due to inability to meet pt's needs.]  Applications Medicaid  Education Interventions    Applications Medicaid  Mental Health Interventions   Mental Health Discussed/Reviewed Mental Health Reviewed  [facility] Supervisor reports pt has been psychologically cleared and denies active SI/HI. LCSW suggessted reviewing case with leadership on additional support services. LCSW will continue to collaborate with RNCM and plans to f/up with APS CM]  Safety Interventions   Safety Discussed/Reviewed Safety Reviewed  [LCSW informed TOC Supervisor of safety concerns in the home due to current risk of food insecurity and high fall risk. Pt has minimal support in community and housing coordinator is out of the office a few days this week. Pt is at risk of eviction.]       Follow up plan:  LCSW will continue to provide supportive resources to patient and collaborate with team to strengthen support    Encounter Outcome:  Patient Visit Completed   [name], MSW, LCSW [facility] Care Management [facility]  [facility] [email or web address] Phone [phone] 5:32 PM

## 2023-06-30 NOTE — Progress Notes (Signed)
Physical Therapy Treatment Patient Details Name: [name] MRN: [id number] DOB: October 10, 1926 Today's Date: 06/30/2023   History of Present Illness [name] is a 89 y.o. female presents with fall and suicidal ideation. PMH: depression, HTN, hyperlipidemia, osteopenia, GERD    PT Comments  Patient presents with significant change in function since EVal on  11/8 where patient ambulated x 20'. Today patient required max of 2 to stand , posterior bias in standing, unable to safely step as feet sliding forward.  Patient will benefit from continued inpatient follow up therapy, <3 hours/day    If plan is discharge home, recommend the following: Two people to help with walking and/or transfers;Assist for transportation;Assistance with cooking/housework;Help with stairs or ramp for entrance   Can travel by private vehicle     No  Equipment Recommendations  None recommended by PT    Recommendations for Other Services       Precautions / Restrictions Precautions Precautions: Fall Restrictions Weight Bearing Restrictions: No     Mobility  Bed Mobility Overal bed mobility: Needs Assistance Bed Mobility: Supine to Sit, Sit to Supine     Supine to sit: Max assist Sit to supine: Max assist   General bed mobility comments: Patient demonstrates increased trunk resisitance when mobilizing,  falling  posteriorly upon sitting. graduallly gained balance    Transfers Overall transfer level: Needs assistance Equipment used: Rolling walker (2 wheels)   Sit to Stand: Max assist, +2 physical assistance, +2 safety/equipment           General transfer comment: patient requiring significant support to stand, feet sliding forward, poster push. Stood briefly, unable to take a step with balance loss and postior bias.    Ambulation/Gait                   Stairs             Wheelchair Mobility     Tilt Bed    Modified Rankin (Stroke Patients Only)       Balance  Overall balance assessment: Needs assistance Sitting-balance support: Bilateral upper extremity supported, Feet unsupported Sitting balance-Leahy Scale: Poor     Standing balance support: Bilateral upper extremity supported, Reliant on assistive device for balance, During functional activity Standing balance-Leahy Scale: Zero                              Cognition Arousal: Alert Behavior During Therapy: WFL for tasks assessed/performed Overall Cognitive Status: No family/caregiver present to determine baseline cognitive functioning                                 General Comments: pt pleasant, O to place not time.  follows simple directions        Exercises      General Comments        Pertinent Vitals/Pain Pain Assessment Faces Pain Scale: Hurts little more Pain Location: left shoulder and back Pain Descriptors / Indicators: Aching, Discomfort, Grimacing Pain Intervention(s): Monitored during session    Home Living                          Prior Function            PT Goals (current goals can now be found in the care plan section) Progress towards PT goals: Progressing toward goals  Frequency    Min 1X/week      PT Plan      Co-evaluation              AM-PAC PT "6 Clicks" Mobility   Outcome Measure  Help needed turning from your back to your side while in a flat bed without using bedrails?: Total Help needed moving from lying on your back to sitting on the side of a flat bed without using bedrails?: Total Help needed moving to and from a bed to a chair (including a wheelchair)?: Total Help needed standing up from a chair using your arms (e.g., wheelchair or bedside chair)?: Total Help needed to walk in hospital room?: Total Help needed climbing 3-5 steps with a railing? : Total 6 Click Score: 6    End of Session Equipment Utilized During Treatment: Gait belt Activity Tolerance: Patient tolerated treatment  well Patient left: in bed;with call bell/phone within reach;with nursing/sitter in room Nurse Communication: Mobility status PT Visit Diagnosis: Unsteadiness on feet (R26.81);History of falling (Z91.81);Muscle weakness (generalized) (M62.81);Difficulty in walking, not elsewhere classified (R26.2)     Time: 3295-1884 PT Time Calculation (min) (ACUTE ONLY): 25 min  Charges:    $Therapeutic Activity: 23-37 mins PT General Charges $$ ACUTE PT VISIT: 1 Visit                     Blanchard Kelch PT Acute Rehabilitation Services Office [phone] Weekend [phone]    Rada Hay 06/30/2023, 2:56 PM

## 2023-06-30 NOTE — Patient Outreach (Signed)
  Care Coordination   Follow Up Visit Note   06/30/2023 Name: [name] MRN: [id number] DOB: [birth year]  [name] is a 89 y.o. year old female who sees Venita Sheffield, MD for primary care. I spoke with Sharlee Blew SW w/APS by phone today.  What matters to the patients health and wellness today?  N/A    Goals Addressed             This Visit's Progress    RN Care Coordination Activities: further follow up needed       Care Coordination Interventions: Reviewed chart noted patient remains to be at [facility] Emergency Department with plans for placement Confirmed with SW Agustin Cree, no bed offers for patient have been received as of yet Collaborated with Rulon Sera SW w/APS, discussed patient's current ED holding status and no bed offers as of yet Sent secure chat to [name] LCSW who will outreach to Dianna to further discuss options for placement     Interventions Today    Flowsheet Row Most Recent Value  General Interventions   General Interventions Discussed/Reviewed Communication with, Level of Care  Communication with Social Work  Sharlee Blew SW w/APS,  [name] LCSW,  Dianna Jeanell Sparrow w/[facility] ED]  Level of Care --  [pending placement into acute skilled rehab]  Safety Interventions   Safety Discussed/Reviewed Safety Discussed, Safety Reviewed, Fall Risk, Home Safety          SDOH assessments and interventions completed:  No     Care Coordination Interventions:  Yes, provided   Follow up plan: Follow up call pending patient's discharge to home    Encounter Outcome:  Patient Visit Completed

## 2023-07-01 ENCOUNTER — Ambulatory Visit: Payer: Self-pay

## 2023-07-01 NOTE — ED Notes (Signed)
Pt took meds this morning and she has mostly slept all day.

## 2023-07-01 NOTE — ED Notes (Addendum)
Writer woke up pt to ask her would she like to eat her dinner now she replied by saying, "No! and leave me alone go away!"

## 2023-07-01 NOTE — Progress Notes (Signed)
PT Cancellation Note  Patient Details Name: [name] MRN: [id number] DOB: [birth year]   Cancelled Treatment:    Reason Eval/Treat Not Completed: Fatigue/lethargy limiting ability to participate. Pt asleep upon arrival, doesn't awake to therapist's loud voice, states "what" to sternal rub. Pt with audible snoring, unable to wake and engage in therapy. Per nursing, pt has been tired all day. Will continue to follow acutely.   Tori Bobbi Yount PT, DPT 07/01/23, 1:14 PM

## 2023-07-01 NOTE — TOC CM/SW Note (Signed)
CM spoke with APS CSW Primitivo Gauze and updated that there are currently no bed offers for patient.  Shaune Pascal reports she will be in the office tomorrow and make additional phone calls.  Case discussed with PT and requested to increase frequency of visit to mobilize patient.

## 2023-07-01 NOTE — ED Notes (Signed)
Pt still sleeping this morning.  Pt did not move back to this unit until early this morning around 5 am

## 2023-07-01 NOTE — Patient Outreach (Signed)
  Care Coordination   Multidisciplinary Case Review Note    07/01/2023 Name: KIANI BESSELMAN MRN: [id number] DOB: 11/10/1926  TANGERINE LOOBY is a 89 y.o. year old female who sees Venita Sheffield, MD for primary care.  The  multidisciplinary care team met today to review patient care needs and barriers.     SDOH assessments and interventions completed:  No     Care Coordination Interventions Activated:  Yes   Care Coordination Interventions:  Yes, provided   Follow up plan: Ongoing collaboration with APS SW Primitivo Gauze and [facility] ED SW as needed    Multidisciplinary Team Attendees:   Delsa Sale RN BSN CCM Remigio Eisenmenger BSW

## 2023-07-01 NOTE — ED Provider Notes (Signed)
Emergency Medicine Observation Re-evaluation Note  [name] is a 89 y.o. female, seen on rounds today.  Pt initially presented to the ED for complaints of Fall (/) and Suicidal Currently, the patient is awaiting placement in facility.  Had presented with concern for SI, feelings of loneliness, and tried to ingest pills, however on psychiatry evaluation denied SI, was psychiatrically cleared however there was concern regarding her ability to care for herself and CSW working on placement.   She was hypotensive last night, thought to be due to dehydration. She declined admission and her blood pressures improved after fluids and stopping BP medications.  Physical Exam  BP (!) 157/81 (BP Location: Right Arm)   Pulse 69   Temp 98 F (36.7 C) (Oral)   Resp 14   SpO2 96%  Physical Exam General: NAD Cardiac: RR Lungs: even unlabored Psych: NA  ED Course / MDM  EKG:EKG Interpretation Date/Time:  Thursday June 25 2023 22:35:40 EST Ventricular Rate:  72 PR Interval:  248 QRS Duration:  88 QT Interval:  406 QTC Calculation: 444 R Axis:   -22  Text Interpretation: Sinus rhythm with 1st degree A-V block with Premature supraventricular complexes Anteroseptal infarct , age undetermined Abnormal ECG Confirmed by Tilden Fossa [phone]) on 06/26/2023 3:22:51 PM  I have reviewed the labs performed to date as well as medications administered while in observation.  Recent changes in the last 24 hours include blood pressures decreased to 60s, given IV fluids with improvement. Has been taken off of bp medications and stable. Is sleeping at this time.  Plan  Current plan is for placement in facility.  Continue to monitor blood pressures--will not reinitiate medications at this time given significant hypotension so recently but will consider pending trend.     Alvira Monday, MD 07/01/23 [phone]

## 2023-07-02 ENCOUNTER — Encounter: Payer: Self-pay | Admitting: Licensed Clinical Social Worker

## 2023-07-02 NOTE — ED Notes (Signed)
Patient was able to make phone call to friend

## 2023-07-02 NOTE — Progress Notes (Signed)
CSW has outreached to [employer] for review. Awaiting response.

## 2023-07-02 NOTE — ED Notes (Signed)
Patient currently resting in bed, very pleasant today.

## 2023-07-02 NOTE — Patient Outreach (Signed)
  Care Coordination   Follow Up Visit Note   07/02/2023 Name: [name] MRN: [id number] DOB: 09-07-1926  [name] is a 89 y.o. year old female who sees Venita Sheffield, MD for primary care. I  engaged with Independent Living Coordinator  What matters to the patients health and wellness today?  Patient was not engaged during this encounter     SDOH assessments and interventions completed:  No     Care Coordination Interventions:  Yes, provided  Interventions Today    Flowsheet Row Most Recent Value  Chronic Disease   Chronic disease during today's visit Diabetes, Other  [MDD, GAD]  General Interventions   General Interventions Discussed/Reviewed Level of Care, General Interventions Reviewed  Level of Care Skilled Nursing Facility, Assisted Living  [LCSW returned call from [facility]. States she is concerned about pt's ability to care for self due to being legally blind and ongoing mobility limitations. [name] may f/up with APS CM regarding concerns and update on placement]  Mental Health Interventions   Mental Health Discussed/Reviewed Coping Strategies  [Per [name], son, Clide Cliff, has blocked pt's #. APS has been informed of minimal support in the home.]  Safety Interventions   Safety Discussed/Reviewed Safety Reviewed, Fall Risk, Home Safety  Advanced Directive Interventions   Advanced Directives Discussed/Reviewed End of Life  End of Life Palliative  [[name] reports pt's Palliative Care worker is [name]. She may f/up with her regarding eligibility for hospice services. LCSW will collaborate with RNCM about suggestion]       Follow up plan:  LCSW will continue to collaborate with medical team regarding patient care and supportive resources    Encounter Outcome:  Patient Visit Completed   [name], MSW, LCSW [facility] Care Management [facility]  [email or web address] Phone [phone] 7:07 PM

## 2023-07-02 NOTE — Progress Notes (Signed)
Physical Therapy Treatment Patient Details Name: [name] MRN: [id number] DOB: 23-May-1927 Today's Date: 07/02/2023   History of Present Illness [name] is a 89 y.o. female presents with fall and suicidal ideation. PMH: depression, HTN, hyperlipidemia, osteopenia, GERD    PT Comments  Pt was more agreeable to OOB activity today. Pt performed supine to sit with increased time modified independently. Sit to stand with RW was performed with min assist and blocking of feet. Pt ambulated to bathroom with max assistance to propel RW. Pt peformed sit to stand from commode, where she required mod assist to stand due to LE weakness, decreased balance, and low height  commode. Pt ambulated 90 ft, but was very unsteady, had a very narrow BOS, and a flexed trunk. Pt is a high fall risk. Overall pt is progressing with mobility. LPT recommends SNF.   If plan is discharge home, recommend the following: Two people to help with walking and/or transfers;Assist for transportation;Assistance with cooking/housework;Help with stairs or ramp for entrance   Can travel by private vehicle     No  Equipment Recommendations  None recommended by PT    Recommendations for Other Services       Precautions / Restrictions Precautions Precautions: Fall Precaution Comments: feet sliding, LE weakness Restrictions Weight Bearing Restrictions: No     Mobility  Bed Mobility Overal bed mobility: Needs Assistance Bed Mobility: Supine to Sit, Sit to Supine     Supine to sit: Max assist, Modified independent (Device/Increase time) Sit to supine: Modified independent (Device/Increase time)   General bed mobility comments: Pt completed supine to sit and sit to supine modified independently.    Transfers Overall transfer level: Needs assistance Equipment used: Rolling walker (2 wheels) Transfers: Sit to/from Stand Sit to Stand: Mod assist           General transfer comment: pt's feet would slip when  attempting to stand, blocking the feet was required. Leans heavily onto RW. States LE are weak    Ambulation/Gait Ambulation/Gait assistance: Editor, commissioning (Feet): 90 Feet Assistive device: Rolling walker (2 wheels) Gait Pattern/deviations: Narrow base of support, Trunk flexed, Step-through pattern, Step-to pattern Gait velocity: Decreased     General Gait Details: short steps, forward trunk, putting lot of weight through RW, unsteady especially with turns, max assistance to propel and "drive" walker.   Stairs             Wheelchair Mobility     Tilt Bed    Modified Rankin (Stroke Patients Only)       Balance                                            Cognition Arousal: Alert Behavior During Therapy: WFL for tasks assessed/performed Overall Cognitive Status: No family/caregiver present to determine baseline cognitive functioning                                 General Comments: Tired, excited to walk, AxO x 4        Exercises      General Comments        Pertinent Vitals/Pain Pain Assessment Faces Pain Scale: Hurts a little bit    Home Living  Prior Function            PT Goals (current goals can now be found in the care plan section) Acute Rehab PT Goals Patient Stated Goal: agreeable to walk PT Goal Formulation: With patient Time For Goal Achievement: 07/10/23 Progress towards PT goals: Progressing toward goals    Frequency    Min 1X/week      PT Plan      Co-evaluation              AM-PAC PT "6 Clicks" Mobility   Outcome Measure  Help needed turning from your back to your side while in a flat bed without using bedrails?: A Little Help needed moving from lying on your back to sitting on the side of a flat bed without using bedrails?: A Little Help needed moving to and from a bed to a chair (including a wheelchair)?: A Lot Help needed standing up  from a chair using your arms (e.g., wheelchair or bedside chair)?: A Lot Help needed to walk in hospital room?: A Lot Help needed climbing 3-5 steps with a railing? : Total 6 Click Score: 13    End of Session Equipment Utilized During Treatment: Gait belt Activity Tolerance: Patient tolerated treatment well Patient left: in bed;with call bell/phone within reach;with nursing/sitter in room Nurse Communication: Mobility status PT Visit Diagnosis: Unsteadiness on feet (R26.81);History of falling (Z91.81);Muscle weakness (generalized) (M62.81);Difficulty in walking, not elsewhere classified (R26.2)     Time: 6440-3474 PT Time Calculation (min) (ACUTE ONLY): 24 min  Charges:    $Gait Training: 8-22 mins $Therapeutic Activity: 8-22 mins PT General Charges $$ ACUTE PT VISIT: 1 Visit                     Lazaro Arms, SPTA 07/02/2023, 3:01 PM

## 2023-07-02 NOTE — ED Provider Notes (Signed)
Emergency Medicine Observation Re-evaluation Note  [name] is a 89 y.o. female, seen on rounds today.  Pt initially presented to the ED for complaints of Fall (/) and Suicidal Currently, the patient is HDS in NAD.  Physical Exam  BP 135/86 (BP Location: Left Arm)   Pulse 81   Temp 97.6 F (36.4 C) (Oral)   Resp 20   SpO2 98%  Physical Exam General: Appears to be resting comfortably in bed, no acute distress. Cardiac: Regular rate, normal heart rate, non-emergent blood pressure for this morning's vitals. Lungs: No increased work of breathing.  Equal chest rise appreciated Psych: Calm, asleep in bed.   ED Course / MDM  EKG:EKG Interpretation Date/Time:  Thursday June 25 2023 22:35:40 EST Ventricular Rate:  72 PR Interval:  248 QRS Duration:  88 QT Interval:  406 QTC Calculation: 444 R Axis:   -22  Text Interpretation: Sinus rhythm with 1st degree A-V block with Premature supraventricular complexes Anteroseptal infarct , age undetermined Abnormal ECG Confirmed by Tilden Fossa [phone]) on 06/26/2023 3:22:51 PM  I have reviewed the labs performed to date as well as medications administered while in observation.    Plan  Current plan is for placement in facility.  Continue to monitor blood pressures--will not reinitiate medications at this time given significant hypotension so recently but will consider pending trend.     Glyn Ade, MD 07/02/23 [phone]

## 2023-07-03 ENCOUNTER — Emergency Department (HOSPITAL_COMMUNITY): Payer: Medicare Other

## 2023-07-03 DIAGNOSIS — R918 Other nonspecific abnormal finding of lung field: Secondary | ICD-10-CM | POA: Diagnosis not present

## 2023-07-03 MED ORDER — ONDANSETRON HCL 4 MG/2ML IJ SOLN
4.0000 mg | Freq: Once | INTRAMUSCULAR | Status: DC
  Filled 2023-07-03: qty 2

## 2023-07-03 MED ORDER — GLUCAGON HCL RDNA (DIAGNOSTIC) 1 MG IJ SOLR
1.0000 mg | Freq: Once | INTRAMUSCULAR | Status: DC
  Filled 2023-07-03: qty 1

## 2023-07-03 NOTE — ED Provider Notes (Addendum)
Emergency Medicine Observation Re-evaluation Note  [name] is a 89 y.o. female, seen on rounds today.  Pt initially presented to the ED for complaints of Fall (/) and Suicidal Currently, the patient is resting comfortably.  Physical Exam  BP (!) 153/85 (BP Location: Left Arm)   Pulse 71   Temp (!) 97.5 F (36.4 C) (Oral)   Resp 14   SpO2 98%  Physical Exam General: No acute distress resting comfortably Cardiac: Regular rate Lungs: No respiratory distress noted Psych: Resting comfortably  ED Course / MDM  EKG:EKG Interpretation Date/Time:  Thursday June 25 2023 22:35:40 EST Ventricular Rate:  72 PR Interval:  248 QRS Duration:  88 QT Interval:  406 QTC Calculation: 444 R Axis:   -22  Text Interpretation: Sinus rhythm with 1st degree A-V block with Premature supraventricular complexes Anteroseptal infarct , age undetermined Abnormal ECG Confirmed by Tilden Fossa [phone]) on 06/26/2023 3:22:51 PM  I have reviewed the labs performed to date as well as medications administered while in observation.  Recent changes in the last 24 hours include no acute events overnight.  Plan  Current plan is for continuing to look for placement.  The patient blood pressure is trending upward today.  Her blood pressure medications were held due to hypotension.  I will wait to see what her blood pressures do as the day goes on to determine whether or not this needs to be restarted given her recent issues with hypotension..  I was asked to reevaluate the patient after my initial assessment this morning.  The patient was eating breakfast and was taking her pills at the same time.  She swallowed her gabapentin as well as some food and feels that this will not go down.  She is having difficulty swallowing her secretions at this time.  Patient is evaluated here.  I will give patient glucagon here as well as Zofran.  Will obtain chest x-ray and reevaluate with concern for possible esophageal  impaction.  The patient was able to swallow prior to the medications being administered.    Durwin Glaze, MD 07/03/23 [id number]    Durwin Glaze, MD 07/03/23 1442    Durwin Glaze, MD 07/03/23 [phone]

## 2023-07-03 NOTE — Progress Notes (Signed)
Physical Therapy Treatment Patient Details Name: [name] MRN: [id number] DOB: August 25, 1926 Today's Date: 07/03/2023   History of Present Illness [name] is a 89 y.o. female presents with fall and suicidal ideation. PMH: depression, HTN, hyperlipidemia, osteopenia, GERD    PT Comments  Pt was agreeable to participate in therapy today. Pt performed supine to sit independently, but required max assist when scooting up towards head of bed. Pt performed sit to stand with RW, she required mod assist and blocking of R foot to prevent sliding. Pt ambulated for 80 ft with RW in ED hallway. She required max assist to control walker direction and min assist for walker propulsion. Overall pt is still a high fall risk and unsteady, but is motivated. LPT recommends SNF.   If plan is discharge home, recommend the following: Two people to help with walking and/or transfers;Assist for transportation;Assistance with cooking/housework;Help with stairs or ramp for entrance   Can travel by private vehicle     Yes  Equipment Recommendations  None recommended by PT    Recommendations for Other Services       Precautions / Restrictions Precautions Precautions: Fall Precaution Comments: feet sliding, LE weakness Restrictions Weight Bearing Restrictions: No     Mobility  Bed Mobility Overal bed mobility: Needs Assistance Bed Mobility: Supine to Sit, Sit to Supine     Supine to sit: Modified independent (Device/Increase time) Sit to supine: Max assist   General bed mobility comments: Pt performed supine to sit with no assist, but required max assist to help scoot up in bed.    Transfers Overall transfer level: Needs assistance Equipment used: Rolling walker (2 wheels) Transfers: Sit to/from Stand Sit to Stand: Mod assist           General transfer comment: Pt's R foot required blocking so it would not slip. Increased time to perform transfer    Ambulation/Gait Ambulation/Gait  assistance: Min assist Gait Distance (Feet): 80 Feet Assistive device: Rolling walker (2 wheels) Gait Pattern/deviations: Narrow base of support, Trunk flexed, Step-through pattern, Step-to pattern Gait velocity: Decreased     General Gait Details: short steps, forward trunk, very narrow BOS, unsteady, max assistance to "drive" walker.   Stairs             Wheelchair Mobility     Tilt Bed    Modified Rankin (Stroke Patients Only)       Balance                                            Cognition Arousal: Alert Behavior During Therapy: WFL for tasks assessed/performed Overall Cognitive Status: No family/caregiver present to determine baseline cognitive functioning                                 General Comments: AxO x 4        Exercises      General Comments        Pertinent Vitals/Pain Pain Assessment Pain Assessment: No/denies pain Faces Pain Scale: No hurt    Home Living                          Prior Function            PT Goals (current goals can  now be found in the care plan section) Acute Rehab PT Goals Patient Stated Goal: agreeable to walk PT Goal Formulation: With patient Time For Goal Achievement: 07/10/23 Progress towards PT goals: Progressing toward goals    Frequency    Min 1X/week      PT Plan      Co-evaluation              AM-PAC PT "6 Clicks" Mobility   Outcome Measure  Help needed turning from your back to your side while in a flat bed without using bedrails?: A Little Help needed moving from lying on your back to sitting on the side of a flat bed without using bedrails?: A Little Help needed moving to and from a bed to a chair (including a wheelchair)?: A Lot Help needed standing up from a chair using your arms (e.g., wheelchair or bedside chair)?: A Lot Help needed to walk in hospital room?: A Lot Help needed climbing 3-5 steps with a railing? : Total 6 Click  Score: 13    End of Session Equipment Utilized During Treatment: Gait belt Activity Tolerance: Patient tolerated treatment well Patient left: with call bell/phone within reach;with nursing/sitter in room;in bed Nurse Communication: Mobility status PT Visit Diagnosis: Unsteadiness on feet (R26.81);History of falling (Z91.81);Muscle weakness (generalized) (M62.81);Difficulty in walking, not elsewhere classified (R26.2)     Time: 4098-1191 PT Time Calculation (min) (ACUTE ONLY): 18 min  Charges:    $Gait Training: 8-22 mins PT General Charges $$ ACUTE PT VISIT: 1 Visit                     Lazaro Arms, SPTA 07/03/2023, 11:52 AM

## 2023-07-04 LAB — URINALYSIS, ROUTINE W REFLEX MICROSCOPIC
Bilirubin Urine: NEGATIVE
Glucose, UA: NEGATIVE mg/dL
Ketones, ur: NEGATIVE mg/dL
Leukocytes,Ua: NEGATIVE
Nitrite: NEGATIVE
Protein, ur: NEGATIVE mg/dL
Specific Gravity, Urine: 1.006 (ref 1.005–1.030)
pH: 6 (ref 5.0–8.0)

## 2023-07-04 LAB — CULTURE, BLOOD (ROUTINE X 2)
Culture: NO GROWTH
Special Requests: ADEQUATE

## 2023-07-04 MED ORDER — LIDOCAINE VISCOUS HCL 2 % MT SOLN
15.0000 mL | Freq: Once | OROMUCOSAL | Status: AC
  Administered 2023-07-04: 15 mL via ORAL
  Filled 2023-07-04 (×2): qty 15

## 2023-07-04 MED ORDER — ALUM & MAG HYDROXIDE-SIMETH 200-200-20 MG/5ML PO SUSP
30.0000 mL | Freq: Once | ORAL | Status: AC
  Administered 2023-07-04: 30 mL via ORAL
  Filled 2023-07-04: qty 30

## 2023-07-04 MED ORDER — LIDOCAINE VISCOUS HCL 2 % MT SOLN
15.0000 mL | Freq: Once | OROMUCOSAL | Status: AC
  Administered 2023-07-04: 15 mL via OROMUCOSAL
  Filled 2023-07-04 (×2): qty 15

## 2023-07-04 MED ORDER — ACETAMINOPHEN 325 MG PO TABS
650.0000 mg | ORAL_TABLET | Freq: Once | ORAL | Status: AC
  Administered 2023-07-04: 650 mg via ORAL
  Filled 2023-07-04: qty 2

## 2023-07-04 NOTE — ED Notes (Signed)
Assisted pt to restroom, pt very slow, needs hands on assist and walker. Pt can do own ADL while in restroom, just assisted pt with putting her clothes back up. Moderately unsteady on her feet, had to hold on her to prevent falling going back to bed. RN notified.

## 2023-07-04 NOTE — ED Notes (Signed)
Patient observed eating lunch at this time. This nurse reassessed throat pain. Patient states it feels a whole lot better now.

## 2023-07-04 NOTE — ED Notes (Addendum)
Provider notified about choking incident and coughing, EDP at bedside. orders for GI cocktail given.

## 2023-07-04 NOTE — ED Notes (Signed)
Patient noted to be coughing frequently this shift . C/o sore throat earlier. Writer notified provider and tylenol was given. Patient reassessed for pain at this time and stated throat is still sore and feels like something is stuck in throat. Patient states she had a choking episode yesterday and has been coughing since. Nurse to notify provider.

## 2023-07-04 NOTE — ED Notes (Signed)
Patient resting in bed quietly

## 2023-07-04 NOTE — ED Provider Notes (Signed)
Emergency Medicine Observation Re-evaluation Note  [name] is a 89 y.o. female, seen on rounds today.  Pt initially presented to the ED for complaints of Fall (/) and Suicidal Currently, the patient is sleeping.Marland Kitchen  Physical Exam  BP 125/78 (BP Location: Left Arm)   Pulse 88   Temp 98.3 F (36.8 C) (Oral)   Resp 16   SpO2 96%  Physical Exam   ED Course / MDM  EKG:EKG Interpretation Date/Time:  Thursday June 25 2023 22:35:40 EST Ventricular Rate:  72 PR Interval:  248 QRS Duration:  88 QT Interval:  406 QTC Calculation: 444 R Axis:   -22  Text Interpretation: Sinus rhythm with 1st degree A-V block with Premature supraventricular complexes Anteroseptal infarct , age undetermined Abnormal ECG Confirmed by Tilden Fossa [phone]) on 06/26/2023 3:22:51 PM  I have reviewed the labs performed to date as well as medications administered while in observation.  Recent changes in the last 24 hours include none.  Plan  Current plan is for placement.    Lorre Nick, MD 07/04/23 [phone]

## 2023-07-04 NOTE — ED Notes (Signed)
Patient has had no coughing since medication was administered.

## 2023-07-04 NOTE — ED Notes (Signed)
Patient awake complaining of sore throat. MD notified, oral solution given. Patient escorted to bathroom and changed with walker. Resting well at this time.

## 2023-07-05 ENCOUNTER — Encounter (HOSPITAL_COMMUNITY): Payer: Self-pay | Admitting: Emergency Medicine

## 2023-07-05 MED ORDER — POLYETHYLENE GLYCOL 3350 17 G PO PACK
17.0000 g | PACK | Freq: Every day | ORAL | Status: DC | PRN
  Filled 2023-07-05: qty 1

## 2023-07-05 NOTE — ED Notes (Signed)
Patient resting in bed quietly

## 2023-07-05 NOTE — Progress Notes (Addendum)
  At this time CSW has reached out to [facility] to try to confirm bed offer. There will be no-one to speak to in regards to bed offer until 07/06/2023. TOC will continue to follow to assist with placement.       Christine Zayveon Raschke LCSW-A  07/05/2023 8:38 AM

## 2023-07-05 NOTE — ED Provider Notes (Signed)
Emergency Medicine Observation Re-evaluation Note  [name] is a 89 y.o. female, seen on rounds today.  Pt initially presented to the ED for complaints of Fall (/) and Suicidal Currently, the patient is resting.  Physical Exam  BP (!) 177/75 (BP Location: Left Arm) Comment: pt had just gotten up from bathroom  Pulse (!) 55   Temp 98.4 F (36.9 C) (Oral)   Resp 18   SpO2 95%  Physical Exam General: NAD   ED Course / MDM  EKG:EKG Interpretation Date/Time:  Thursday June 25 2023 22:35:40 EST Ventricular Rate:  72 PR Interval:  248 QRS Duration:  88 QT Interval:  406 QTC Calculation: 444 R Axis:   -22  Text Interpretation: Sinus rhythm with 1st degree A-V block with Premature supraventricular complexes Anteroseptal infarct , age undetermined Abnormal ECG Confirmed by Tilden Fossa [phone]) on 06/26/2023 3:22:51 PM  I have reviewed the labs performed to date as well as medications administered while in observation.  Recent changes in the last 24 hours include NAD.  Plan  Current plan is for placement.    Wynetta Fines, MD 07/05/23 [phone]

## 2023-07-06 MED ORDER — BENZOCAINE 10 % MT GEL
Freq: Four times a day (QID) | OROMUCOSAL | Status: DC | PRN
  Administered 2023-07-08: 1 via OROMUCOSAL
  Filled 2023-07-06: qty 9.4

## 2023-07-06 NOTE — Progress Notes (Signed)
CSW left another Passenger transport manager for [place], admissions at [facility].

## 2023-07-06 NOTE — ED Notes (Signed)
Patient has been alert this shift. Patient is confused. Patient has been cooperative this shift. Patient is medication compliant. Patient needs assist with ADLs.

## 2023-07-06 NOTE — ED Provider Notes (Signed)
Emergency Medicine Observation Re-evaluation Note  JEANNELLE SCANLON is a 89 y.o. female, seen on rounds today.  Pt initially presented to the ED for complaints of Fall (/) and ?SNF placement ad presented with concern for SI, feelings of loneliness, and tried to ingest pills, however on psychiatry evaluation denied SI, was psychiatrically cleared however there was concern regarding her ability to care for herself and CSW working on placement. Currently, the patient is awaiting placement. Has been here for 262 hr.  Physical Exam  BP (!) 151/76 (BP Location: Left Arm)   Pulse 78   Temp 98 F (36.7 C) (Oral)   Resp 16   SpO2 98%  Physical Exam General: NAD Cardiac: RR Lungs: even unlabored Psych: NA  ED Course / MDM  EKG:EKG Interpretation Date/Time:  Thursday June 25 2023 22:35:40 EST Ventricular Rate:  72 PR Interval:  248 QRS Duration:  88 QT Interval:  406 QTC Calculation: 444 R Axis:   -22  Text Interpretation: Sinus rhythm with 1st degree A-V block with Premature supraventricular complexes Anteroseptal infarct , age undetermined Abnormal ECG Confirmed by Tilden Fossa [phone]) on 06/26/2023 3:22:51 PM  I have reviewed the labs performed to date as well as medications administered while in observation.  Recent changes in the last 24 hours include none.  Blood pressure medications had been discontinued last week due to low blood pressures.  Since that time she has had blood pressures ranging from 130s to 170s.  At her age, feel she she would tolerate slightly higher blood pressures versus slightly lower we will continue to monitor what her average blood pressures are.  Would consider restarting a low-dose of antihypertensives if she has values more consistently in the 170s.  Plan  Current plan is for placement.    Alvira Monday, MD 07/06/23 [phone]

## 2023-07-06 NOTE — TOC CM/SW Note (Signed)
 healthcare has declined referral.

## 2023-07-06 NOTE — Progress Notes (Signed)
Physical Therapy Treatment Patient Details Name: [name] MRN: [id number] DOB: 1927/07/12 Today's Date: 07/06/2023   History of Present Illness [name] is a 89 y.o. female presents with fall and suicidal ideation. PMH: depression, HTN, hyperlipidemia, osteopenia, GERD    PT Comments  The patient is very alert and participates in  mobility, ambulated x 80;' with RW, does require steady support for sit to stand, especially from low toilet. [name] making steady progress with mobility. Patient will benefit from continued inpatient follow up therapy, <3 hours/day'     If plan is discharge home, recommend the following: Assist for transportation;Assistance with cooking/housework;Help with stairs or ramp for entrance;A little help with walking and/or transfers   Can travel by private vehicle     Yes  Equipment Recommendations  None recommended by PT    Recommendations for Other Services       Precautions / Restrictions Precautions Precautions: Fall Restrictions Weight Bearing Restrictions: No     Mobility  Bed Mobility   Bed Mobility: Supine to Sit     Supine to sit: Modified independent (Device/Increase time)     General bed mobility comments: Pt performed supine to sit with no assist,.    Transfers Overall transfer level: Needs assistance Equipment used: Rolling walker (2 wheels) Transfers: Sit to/from Stand Sit to Stand: Mod assist           General transfer comment: min assist from bed . mod from lower surface, with rail on left at toilet    Ambulation/Gait Ambulation/Gait assistance: Min assist Gait Distance (Feet): 80 Feet Assistive device: Rolling walker (2 wheels) Gait Pattern/deviations: Narrow base of support, Trunk flexed, Step-through pattern, Step-to pattern Gait velocity: Decreased     General Gait Details: short steps, forward trunk,  able to trun RW( is used to Firefighter     Tilt Bed     Modified Rankin (Stroke Patients Only)       Balance Overall balance assessment: Needs assistance Sitting-balance support: Feet unsupported, No upper extremity supported Sitting balance-Leahy Scale: Fair     Standing balance support: Bilateral upper extremity supported, Reliant on assistive device for balance, During functional activity Standing balance-Leahy Scale: Poor                              Cognition Arousal: Alert Behavior During Therapy: WFL for tasks assessed/performed Overall Cognitive Status: Within Functional Limits for tasks assessed                                          Exercises      General Comments        Pertinent Vitals/Pain Pain Assessment Faces Pain Scale: Hurts little more Pain Location: I always hurt, I am 89 yo Pain Descriptors / Indicators: Aching, Discomfort Pain Intervention(s): Monitored during session    Home Living                          Prior Function            PT Goals (current goals can now be found in the care plan section) Progress towards PT goals: Progressing toward goals    Frequency    Min 1X/week  PT Plan      Co-evaluation              AM-PAC PT "6 Clicks" Mobility   Outcome Measure  Help needed turning from your back to your side while in a flat bed without using bedrails?: A Little Help needed moving from lying on your back to sitting on the side of a flat bed without using bedrails?: A Little Help needed moving to and from a bed to a chair (including a wheelchair)?: A Little Help needed standing up from a chair using your arms (e.g., wheelchair or bedside chair)?: A Little Help needed to walk in hospital room?: A Little Help needed climbing 3-5 steps with a railing? : A Lot 6 Click Score: 17    End of Session Equipment Utilized During Treatment: Gait belt Activity Tolerance: Patient tolerated treatment well Patient left: with call bell/phone  within reach;with nursing/sitter in room;in bed Nurse Communication: Mobility status PT Visit Diagnosis: Unsteadiness on feet (R26.81);History of falling (Z91.81);Muscle weakness (generalized) (M62.81);Difficulty in walking, not elsewhere classified (R26.2)     Time: 7829-5621 PT Time Calculation (min) (ACUTE ONLY): 36 min  Charges:    $Gait Training: 8-22 mins $Self Care/Home Management: 8-22 PT General Charges $$ ACUTE PT VISIT: 1 Visit                     Blanchard Kelch PT Acute Rehabilitation Services Office [phone] Weekend [phone]    Rada Hay 07/06/2023, 9:33 AM

## 2023-07-06 NOTE — Progress Notes (Addendum)
PASRR: [id number] H  Addend @ 8:57 AM CSW left HIPAA Compliant voicemail for [facility] admissions rep.

## 2023-07-07 ENCOUNTER — Other Ambulatory Visit: Payer: Self-pay

## 2023-07-07 ENCOUNTER — Ambulatory Visit: Payer: Self-pay

## 2023-07-07 MED ORDER — CALCIUM CARBONATE ANTACID 500 MG PO CHEW
1.0000 | CHEWABLE_TABLET | Freq: Once | ORAL | Status: DC
  Administered 2023-07-07: 200 mg via ORAL
  Filled 2023-07-07: qty 1

## 2023-07-07 MED ORDER — CALCIUM CARBONATE ANTACID 500 MG PO CHEW
200.0000 mg | CHEWABLE_TABLET | Freq: Three times a day (TID) | ORAL | Status: DC | PRN
  Filled 2023-07-07: qty 1

## 2023-07-07 MED ORDER — CALCIUM CARBONATE ANTACID 500 MG PO CHEW
400.0000 mg | CHEWABLE_TABLET | Freq: Once | ORAL | Status: AC
  Administered 2023-07-07: 400 mg via ORAL

## 2023-07-07 NOTE — ED Notes (Signed)
Patient has been cooperative and calm this shift. Confusion noted. Medication compliant. Patient needs assist with ADLs.

## 2023-07-07 NOTE — ED Notes (Signed)
Patient alert and cooperative.  Patient ate breakfast.  Patient medication compliant.

## 2023-07-07 NOTE — Patient Outreach (Signed)
  Care Coordination   Chart Review  Visit Note   07/07/2023 Name: [name] MRN: [id number] DOB: Nov 15, 1926  [name] is a 89 y.o. year old female who sees Venita Sheffield, MD for primary care. I reviewed patient's chart today to determine placement/discharge status.  What matters to the patients health and wellness today?  Patient was not engaged during this encounter.     Goals Addressed             This Visit's Progress    RN Care Coordination Activities: further follow up needed       Care Coordination Interventions: Reviewed chart noted patient remains to be at [facility] with ongoing efforts to find  placement         SDOH assessments and interventions completed:  No     Care Coordination Interventions:  Yes, provided   Follow up plan: Follow up call pending upon patient's discharge home   Encounter Outcome:  Patient Visit Completed

## 2023-07-07 NOTE — ED Provider Notes (Signed)
Emergency Medicine Observation Re-evaluation Note  [name] is a 89 y.o. female, seen on rounds today.  Pt initially presented to the ED for complaints of Fall (/) and ?SNF placement ad presented with concern for SI, feelings of loneliness, and tried to ingest pills, however on psychiatry evaluation denied SI, was psychiatrically cleared however there was concern regarding her ability to care for herself and CSW working on placement. Currently, the patient is awaiting placement. Has been here for 262 hr.  Physical Exam  BP (!) 145/72 (BP Location: Left Arm)   Pulse 82   Temp 98.1 F (36.7 C) (Oral)   Resp 16   SpO2 98%  Physical Exam General: NAD Cardiac: RR Lungs: even unlabored Psych: NA  ED Course / MDM  EKG:EKG Interpretation Date/Time:  Thursday June 25 2023 22:35:40 EST Ventricular Rate:  72 PR Interval:  248 QRS Duration:  88 QT Interval:  406 QTC Calculation: 444 R Axis:   -22  Text Interpretation: Sinus rhythm with 1st degree A-V block with Premature supraventricular complexes Anteroseptal infarct , age undetermined Abnormal ECG Confirmed by Tilden Fossa [phone]) on 06/26/2023 3:22:51 PM  I have reviewed the labs performed to date as well as medications administered while in observation.  Recent changes in the last 24 hours include none.  Blood pressure medications had been discontinued last week due to low blood pressures. BP here so far has been good.  Plan  Current plan is for placement.      Melene Plan, DO 07/07/23 [phone]

## 2023-07-08 NOTE — Progress Notes (Signed)
Physical Therapy Treatment Patient Details Name: [name] MRN: [id number] DOB: 1927-05-01 Today's Date: 07/08/2023   History of Present Illness [name] is a 89 y.o. female presents with fall and suicidal ideation. PMH: depression, HTN, hyperlipidemia, osteopenia, GERD    PT Comments  Yje patient is agreeable to ambulate this PM. Patient  appeared  to have more "stiffness" in her back and legs, did require increased steady assistance  to stand from bed and  toilet.  Patient prefers rollator vs 2 wheeled. Will try too have a rollator available next visit.  Patient will benefit from continued inpatient follow up therapy, <3 hours/day    If plan is discharge home, recommend the following: Assist for transportation;Assistance with cooking/housework;Help with stairs or ramp for entrance;A little help with walking and/or transfers   Can travel by private vehicle     Yes  Equipment Recommendations  None recommended by PT    Recommendations for Other Services       Precautions / Restrictions Precautions Precautions: Fall Restrictions Weight Bearing Restrictions: No     Mobility  Bed Mobility   Bed Mobility: Supine to Sit     Supine to sit: Modified independent (Device/Increase time)     General bed mobility comments: Pt performed supine to sit with no assist,.    Transfers   Equipment used: Rolling walker (2 wheels) Transfers: Sit to/from Stand Sit to Stand: Mod assist           General transfer comment: min assist from bed, mod from lower toilet, use of bed rail and rail    Ambulation/Gait Ambulation/Gait assistance: Min assist Gait Distance (Feet): 80 Feet (then 40) Assistive device: Rolling walker (2 wheels) Gait Pattern/deviations: Step-through pattern, Decreased stride length, Narrow base of support, Trunk flexed Gait velocity: Decreased     General Gait Details: short steps, forward trunk, assist to turn RW, assist around obstacles able to  turn RW( is used to rollator)   Comptroller Bed    Modified Rankin (Stroke Patients Only)       Balance Overall balance assessment: Mild deficits observed, not formally tested Sitting-balance support: Feet unsupported, No upper extremity supported Sitting balance-Leahy Scale: Fair     Standing balance support: Bilateral upper extremity supported, Reliant on assistive device for balance, During functional activity Standing balance-Leahy Scale: Poor                              Cognition Arousal: Alert Behavior During Therapy: WFL for tasks assessed/performed Overall Cognitive Status: Difficult to assess Area of Impairment: Memory, Safety/judgement                     Memory: Decreased short-term memory   Safety/Judgement: Decreased awareness of deficits              Exercises      General Comments        Pertinent Vitals/Pain Pain Assessment Faces Pain Scale: Hurts little more Pain Location: back Pain Descriptors / Indicators: Aching, Discomfort Pain Intervention(s): Monitored during session    Home Living                          Prior Function            PT Goals (current goals  can now be found in the care plan section) Progress towards PT goals: Progressing toward goals    Frequency    Min 1X/week      PT Plan      Co-evaluation              AM-PAC PT "6 Clicks" Mobility   Outcome Measure  Help needed turning from your back to your side while in a flat bed without using bedrails?: A Little Help needed moving from lying on your back to sitting on the side of a flat bed without using bedrails?: A Little Help needed moving to and from a bed to a chair (including a wheelchair)?: A Little Help needed standing up from a chair using your arms (e.g., wheelchair or bedside chair)?: A Lot Help needed to walk in hospital room?: A Little Help needed climbing 3-5  steps with a railing? : A Lot 6 Click Score: 16    End of Session Equipment Utilized During Treatment: Gait belt Activity Tolerance: Patient tolerated treatment well Patient left: in bed;with call bell/phone within reach;with nursing/sitter in room Nurse Communication: Mobility status PT Visit Diagnosis: Unsteadiness on feet (R26.81);History of falling (Z91.81);Muscle weakness (generalized) (M62.81);Difficulty in walking, not elsewhere classified (R26.2)     Time: 5852-7782 PT Time Calculation (min) (ACUTE ONLY): 15 min  Charges:    $Gait Training: 8-22 mins PT General Charges $$ ACUTE PT VISIT: 1 Visit                     Blanchard Kelch PT Acute Rehabilitation Services Office [phone] Weekend [phone]    Rada Hay 07/08/2023, 3:20 PM

## 2023-07-08 NOTE — ED Provider Notes (Signed)
  Physical Exam  BP (!) 147/80 (BP Location: Left Arm)   Pulse 82   Temp 97.8 F (36.6 C) (Oral)   Resp 16   SpO2 98%   Physical Exam  Procedures  Procedures  ED Course / MDM   Clinical Course as of 07/08/23 0913  Thu Jun 25, 2023  1311 CT scans negative.  Patient still pending 24-hour ops to complete medical clearance for her ingestion.  This should occur at around 3 PM. [WS]  1549 Patient still awake, oriented.  She has been able to ambulate.  Will consult psychiatry.  Patient medically cleared by observation period.  [WS]    Clinical Course User Index [WS] Lonell Grandchild, MD   Medical Decision Making Amount and/or Complexity of Data Reviewed Labs: ordered. Radiology: ordered.   Patient is eating.  Pending independent living placement.       Benjiman Core, MD 07/08/23 [phone]

## 2023-07-08 NOTE — Progress Notes (Signed)
Bed offer from [facility]. CSW notified Primitivo Gauze who reported DSS has order to sign pt into a facility. [facility] accepted bed offer. CSW initiated [place].

## 2023-07-09 NOTE — ED Notes (Signed)
Patient in bed resting quietly  

## 2023-07-09 NOTE — Progress Notes (Addendum)
Auth pending at this time.   Addend @ 1:16PM Auth approved. Notified Youth worker at [facility]. Per Lorene Dy, pt can admit tomorrow once paperwork is completed by APS. Notified APS worker, Primitivo Gauze. Notified EDP and RN via secure chat. [facility] to transport.

## 2023-07-09 NOTE — ED Provider Notes (Signed)
Emergency Medicine Observation Re-evaluation Note  [name] is a 89 y.o. female, seen on rounds today.  Pt initially presented to the ED for complaints of Fall (/) and ?SNF placement Currently, the patient is eating breakfast and resting comfortably without distress.  Physical Exam  BP (!) 142/69 (BP Location: Left Arm)   Pulse 61   Temp 98.2 F (36.8 C) (Oral)   Resp 16   SpO2 99%  Physical Exam General: Sitting and eating breakfast Cardiac: No murmur on my exam Lungs: Breath sounds on auscultation bilaterally. Psych: No acute agitation  ED Course / MDM  EKG:EKG Interpretation Date/Time:  Thursday June 25 2023 22:35:40 EST Ventricular Rate:  72 PR Interval:  248 QRS Duration:  88 QT Interval:  406 QTC Calculation: 444 R Axis:   -22  Text Interpretation: Sinus rhythm with 1st degree A-V block with Premature supraventricular complexes Anteroseptal infarct , age undetermined Abnormal ECG Confirmed by Tilden Fossa [phone]) on 06/26/2023 3:22:51 PM  I have reviewed the labs performed to date as well as medications administered while in observation.  Recent changes in the last 24 hours include none reported by nursing.  Plan  Current plan is for awaiting placement.    Cleatus Goodin, Canary Brim, MD 07/09/23 [phone]

## 2023-07-10 DIAGNOSIS — F411 Generalized anxiety disorder: Secondary | ICD-10-CM | POA: Diagnosis not present

## 2023-07-10 DIAGNOSIS — G3184 Mild cognitive impairment, so stated: Secondary | ICD-10-CM | POA: Diagnosis not present

## 2023-07-10 DIAGNOSIS — E785 Hyperlipidemia, unspecified: Secondary | ICD-10-CM | POA: Diagnosis not present

## 2023-07-10 DIAGNOSIS — M542 Cervicalgia: Secondary | ICD-10-CM | POA: Diagnosis not present

## 2023-07-10 DIAGNOSIS — M6281 Muscle weakness (generalized): Secondary | ICD-10-CM | POA: Diagnosis not present

## 2023-07-10 DIAGNOSIS — K5904 Chronic idiopathic constipation: Secondary | ICD-10-CM | POA: Diagnosis not present

## 2023-07-10 DIAGNOSIS — F329 Major depressive disorder, single episode, unspecified: Secondary | ICD-10-CM | POA: Diagnosis not present

## 2023-07-10 DIAGNOSIS — M5440 Lumbago with sciatica, unspecified side: Secondary | ICD-10-CM | POA: Diagnosis not present

## 2023-07-10 DIAGNOSIS — M6259 Muscle wasting and atrophy, not elsewhere classified, multiple sites: Secondary | ICD-10-CM | POA: Diagnosis not present

## 2023-07-10 DIAGNOSIS — Z85828 Personal history of other malignant neoplasm of skin: Secondary | ICD-10-CM | POA: Diagnosis not present

## 2023-07-10 DIAGNOSIS — M159 Polyosteoarthritis, unspecified: Secondary | ICD-10-CM | POA: Diagnosis not present

## 2023-07-10 DIAGNOSIS — F32A Depression, unspecified: Secondary | ICD-10-CM | POA: Diagnosis not present

## 2023-07-10 DIAGNOSIS — F29 Unspecified psychosis not due to a substance or known physiological condition: Secondary | ICD-10-CM | POA: Diagnosis not present

## 2023-07-10 DIAGNOSIS — Z87891 Personal history of nicotine dependence: Secondary | ICD-10-CM | POA: Diagnosis not present

## 2023-07-10 DIAGNOSIS — Z9181 History of falling: Secondary | ICD-10-CM | POA: Diagnosis not present

## 2023-07-10 DIAGNOSIS — W19XXXA Unspecified fall, initial encounter: Secondary | ICD-10-CM | POA: Diagnosis not present

## 2023-07-10 DIAGNOSIS — I10 Essential (primary) hypertension: Secondary | ICD-10-CM | POA: Diagnosis not present

## 2023-07-10 DIAGNOSIS — R5381 Other malaise: Secondary | ICD-10-CM | POA: Diagnosis not present

## 2023-07-10 DIAGNOSIS — Z79899 Other long term (current) drug therapy: Secondary | ICD-10-CM | POA: Diagnosis not present

## 2023-07-10 DIAGNOSIS — T50902A Poisoning by unspecified drugs, medicaments and biological substances, intentional self-harm, initial encounter: Secondary | ICD-10-CM | POA: Diagnosis not present

## 2023-07-10 DIAGNOSIS — X838XXA Intentional self-harm by other specified means, initial encounter: Secondary | ICD-10-CM | POA: Diagnosis not present

## 2023-07-10 DIAGNOSIS — I872 Venous insufficiency (chronic) (peripheral): Secondary | ICD-10-CM | POA: Diagnosis not present

## 2023-07-10 DIAGNOSIS — K219 Gastro-esophageal reflux disease without esophagitis: Secondary | ICD-10-CM | POA: Diagnosis not present

## 2023-07-10 DIAGNOSIS — F4321 Adjustment disorder with depressed mood: Secondary | ICD-10-CM | POA: Diagnosis not present

## 2023-07-10 DIAGNOSIS — M545 Low back pain, unspecified: Secondary | ICD-10-CM | POA: Diagnosis not present

## 2023-07-10 DIAGNOSIS — R531 Weakness: Secondary | ICD-10-CM | POA: Diagnosis not present

## 2023-07-10 DIAGNOSIS — S0990XA Unspecified injury of head, initial encounter: Secondary | ICD-10-CM | POA: Diagnosis not present

## 2023-07-10 DIAGNOSIS — Z741 Need for assistance with personal care: Secondary | ICD-10-CM | POA: Diagnosis not present

## 2023-07-10 DIAGNOSIS — F331 Major depressive disorder, recurrent, moderate: Secondary | ICD-10-CM | POA: Diagnosis not present

## 2023-07-10 DIAGNOSIS — Z7401 Bed confinement status: Secondary | ICD-10-CM | POA: Diagnosis not present

## 2023-07-10 NOTE — ED Provider Notes (Addendum)
Emergency Medicine Observation Re-evaluation Note  [name] is a 89 y.o. female, seen on rounds today.  Pt initially presented to the ED for complaints of Fall (/) and ?SNF placement Per previous notes patient presented with some concerns of SI feeling of hopelessness loneliness.  Psychiatry evaluation patient denied SI and was cleared.  There was concern regarding her ability to care for self so she has been holding in the ED waiting for placement currently, the patient is sleeping.  Physical Exam  BP (!) 146/81 (BP Location: Left Arm)   Pulse 69   Temp 98.1 F (36.7 C) (Oral)   Resp 18   SpO2 98%  Physical Exam General: No acute distress Cardiac: Regular rate Lungs: Normal effort Psych: Furred  ED Course / MDM  EKG:EKG Interpretation Date/Time:  Thursday June 25 2023 22:35:40 EST Ventricular Rate:  72 PR Interval:  248 QRS Duration:  88 QT Interval:  406 QTC Calculation: 444 R Axis:   -22  Text Interpretation: Sinus rhythm with 1st degree A-V block with Premature supraventricular complexes Anteroseptal infarct , age undetermined Abnormal ECG Confirmed by Tilden Fossa [phone]) on 06/26/2023 3:22:51 PM  I have reviewed the labs performed to date as well as medications administered while in observation.  Recent changes in the last 24 hours include no events.  Plan  Current plan is for placement.    Linwood Dibbles, MD 07/10/23 [phone] Notified that plan is for patient to go to SNF today.  Patient is medically cleared for discharge   Linwood Dibbles, MD 07/10/23 1015  Patient is set up for discharge today to go to [facility].  Patient states she is excited about being discharged.   Linwood Dibbles, MD 07/10/23 1030

## 2023-07-10 NOTE — Progress Notes (Signed)
PT Cancellation Note  Patient Details Name: JERAMIE BULLARD MRN: [id number] DOB: December 08, 1926   Cancelled Treatment:    Reason Eval/Treat Not Completed: Other (comment). Pt up in bathroom with nursing preparing to d/c to SNF. Will f/u acutely if pt doesn't d/c.    Tori Geneve Kimpel PT, DPT 07/10/23, 11:08 AM

## 2023-07-10 NOTE — Progress Notes (Addendum)
Pt can admit to SNF once APS worker, Primitivo Gauze, completes paperwork.  Addend @ 10:28AM EDP and RN notified via secure chat. RN provided room and report info. [facility] to transport. APS worker aware.  No further TOC needs.

## 2023-07-13 DIAGNOSIS — M6281 Muscle weakness (generalized): Secondary | ICD-10-CM | POA: Diagnosis not present

## 2023-07-13 DIAGNOSIS — F29 Unspecified psychosis not due to a substance or known physiological condition: Secondary | ICD-10-CM | POA: Diagnosis not present

## 2023-07-13 DIAGNOSIS — F411 Generalized anxiety disorder: Secondary | ICD-10-CM | POA: Diagnosis not present

## 2023-07-13 DIAGNOSIS — F331 Major depressive disorder, recurrent, moderate: Secondary | ICD-10-CM | POA: Diagnosis not present

## 2023-07-14 ENCOUNTER — Ambulatory Visit: Payer: Self-pay

## 2023-07-14 ENCOUNTER — Other Ambulatory Visit: Payer: Self-pay

## 2023-07-14 DIAGNOSIS — I872 Venous insufficiency (chronic) (peripheral): Secondary | ICD-10-CM | POA: Diagnosis not present

## 2023-07-14 DIAGNOSIS — K219 Gastro-esophageal reflux disease without esophagitis: Secondary | ICD-10-CM | POA: Diagnosis not present

## 2023-07-14 DIAGNOSIS — K5904 Chronic idiopathic constipation: Secondary | ICD-10-CM | POA: Diagnosis not present

## 2023-07-14 DIAGNOSIS — F411 Generalized anxiety disorder: Secondary | ICD-10-CM | POA: Diagnosis not present

## 2023-07-14 DIAGNOSIS — I10 Essential (primary) hypertension: Secondary | ICD-10-CM | POA: Diagnosis not present

## 2023-07-14 DIAGNOSIS — F331 Major depressive disorder, recurrent, moderate: Secondary | ICD-10-CM | POA: Diagnosis not present

## 2023-07-14 NOTE — Patient Outreach (Signed)
  Care Coordination   Follow Up Visit Note   07/14/2023 Name: ANGI RAMEL MRN: [id number] DOB: 1927-07-29  [name] is a 89 y.o. year old female who sees Venita Sheffield, MD for primary care. I spoke with [name] care ambassador by phone today.  What matters to the patients health and wellness today?  Patient not engaged during this call.     Goals Addressed             This Visit's Progress    COMPLETED: RN Care Coordination Activities: further follow up needed       Care Coordination Interventions: Completed successful inbound call with [name] patient ambassador Discussed patient was placed into [facility] on 07/10/23 per [facility] SW with APS sign off      COMPLETED: To avoid having further falls       Care Coordination Interventions: Completed successful inbound call with [name] patient ambassador Discussed patient was placed into [facility] on 07/10/23 per [facility] SW with APS sign off      COMPLETED: To get help with worsening depression       Care Coordination Interventions: Completed successful inbound call with [name] patient ambassador Discussed patient was placed into [facility] on 07/10/23 per [facility] SW with APS sign off     Interventions Today    Flowsheet Row Most Recent Value  General Interventions   General Interventions Discussed/Reviewed Communication with, Level of Care  Communication with Social Work  [name] LCSW]  Level of Care Skilled Nursing Facility          SDOH assessments and interventions completed:  No     Care Coordination Interventions:  Yes, provided   Follow up plan: No further intervention required.   Encounter Outcome:  Patient Visit Completed

## 2023-07-15 ENCOUNTER — Encounter: Payer: Self-pay | Admitting: Licensed Clinical Social Worker

## 2023-07-15 DIAGNOSIS — R5381 Other malaise: Secondary | ICD-10-CM | POA: Diagnosis not present

## 2023-07-15 DIAGNOSIS — K219 Gastro-esophageal reflux disease without esophagitis: Secondary | ICD-10-CM | POA: Diagnosis not present

## 2023-07-15 DIAGNOSIS — E785 Hyperlipidemia, unspecified: Secondary | ICD-10-CM | POA: Diagnosis not present

## 2023-07-15 DIAGNOSIS — F32A Depression, unspecified: Secondary | ICD-10-CM | POA: Diagnosis not present

## 2023-07-15 NOTE — Patient Outreach (Signed)
  Care Coordination   Multidisciplinary Case Review Note    07/15/2023 Name: [name] MRN: [id number] DOB: March 20, 1927  [name] is a 89 y.o. year old female who sees Venita Sheffield, MD for primary care.  The  multidisciplinary care team met today to review patient care needs and barriers.    Care Coordination Interventions: Multidisciplinary case discussion to review patient ongoing care coordination needs  RN Care Manager Lawanna Kobus Little will make attempts to obtain clarity from APS CM, [facility] regarding LTC Placement. LCSW [name] will follow up to address address disease management and social support needs   SDOH assessments and interventions completed:  No     Care Coordination Interventions Activated:  Yes   Care Coordination Interventions:  Yes, provided  Interventions Today    Flowsheet Row Most Recent Value  Chronic Disease   Chronic disease during today's visit Diabetes, Other  [MDD and GAD]  General Interventions   General Interventions Discussed/Reviewed Communication with  Communication with RN, Social Work       Follow up plan: Follow up call scheduled for 1-2 weeks    Multidisciplinary Team Attendees:   Delsa Sale, RNCM [name], LCSW Alfonzo Beers, PhD  Scribe for Multidisciplinary Case Review:   [name], MSW, LCSW [facility] Care Management [facility]  [facility] [email or web address] Phone [phone] 3:20 PM

## 2023-07-20 DIAGNOSIS — M6281 Muscle weakness (generalized): Secondary | ICD-10-CM | POA: Diagnosis not present

## 2023-07-20 DIAGNOSIS — F29 Unspecified psychosis not due to a substance or known physiological condition: Secondary | ICD-10-CM | POA: Diagnosis not present

## 2023-07-20 DIAGNOSIS — F411 Generalized anxiety disorder: Secondary | ICD-10-CM | POA: Diagnosis not present

## 2023-07-20 DIAGNOSIS — F331 Major depressive disorder, recurrent, moderate: Secondary | ICD-10-CM | POA: Diagnosis not present

## 2023-07-22 ENCOUNTER — Other Ambulatory Visit: Payer: Self-pay

## 2023-07-23 ENCOUNTER — Other Ambulatory Visit (HOSPITAL_COMMUNITY): Payer: Self-pay

## 2023-07-23 ENCOUNTER — Other Ambulatory Visit: Payer: Self-pay

## 2023-07-23 DIAGNOSIS — G3184 Mild cognitive impairment, so stated: Secondary | ICD-10-CM | POA: Diagnosis not present

## 2023-07-23 DIAGNOSIS — F4321 Adjustment disorder with depressed mood: Secondary | ICD-10-CM | POA: Diagnosis not present

## 2023-07-23 DIAGNOSIS — F411 Generalized anxiety disorder: Secondary | ICD-10-CM | POA: Diagnosis not present

## 2023-07-23 DIAGNOSIS — F331 Major depressive disorder, recurrent, moderate: Secondary | ICD-10-CM | POA: Diagnosis not present

## 2023-07-23 NOTE — Progress Notes (Signed)
   07/23/2023  Patient ID: [name], female   DOB: 1927/01/15, 89 y.o.   MRN: [id number]  Attempted to return missed call/voicemail from [name] at [facility].  Patient has moved to SNF that was unable to take her medications packaged and delivered by [facility].  [name] is wondering if these can be returned to the pharmacy, but I did verify with [facility] they cannot take medications back, even when in unopened adherence packaging.  I was not able to reach [place] but left a voicemail with my direct phone number.  I also need to clarify if [facility] should stop filling medications for the patient.  [name], PharmD, DPLA

## 2023-07-26 DIAGNOSIS — F29 Unspecified psychosis not due to a substance or known physiological condition: Secondary | ICD-10-CM | POA: Diagnosis not present

## 2023-07-26 DIAGNOSIS — M6281 Muscle weakness (generalized): Secondary | ICD-10-CM | POA: Diagnosis not present

## 2023-07-26 DIAGNOSIS — R2689 Other abnormalities of gait and mobility: Secondary | ICD-10-CM | POA: Diagnosis not present

## 2023-07-27 ENCOUNTER — Other Ambulatory Visit: Payer: Self-pay

## 2023-07-27 DIAGNOSIS — F29 Unspecified psychosis not due to a substance or known physiological condition: Secondary | ICD-10-CM | POA: Diagnosis not present

## 2023-07-27 DIAGNOSIS — R41841 Cognitive communication deficit: Secondary | ICD-10-CM | POA: Diagnosis not present

## 2023-07-27 DIAGNOSIS — M6281 Muscle weakness (generalized): Secondary | ICD-10-CM | POA: Diagnosis not present

## 2023-07-27 DIAGNOSIS — R1311 Dysphagia, oral phase: Secondary | ICD-10-CM | POA: Diagnosis not present

## 2023-07-28 ENCOUNTER — Other Ambulatory Visit: Payer: Self-pay

## 2023-07-28 DIAGNOSIS — F29 Unspecified psychosis not due to a substance or known physiological condition: Secondary | ICD-10-CM | POA: Diagnosis not present

## 2023-07-28 DIAGNOSIS — R2689 Other abnormalities of gait and mobility: Secondary | ICD-10-CM | POA: Diagnosis not present

## 2023-07-28 DIAGNOSIS — M6281 Muscle weakness (generalized): Secondary | ICD-10-CM | POA: Diagnosis not present

## 2023-07-29 ENCOUNTER — Telehealth: Payer: Self-pay

## 2023-07-29 ENCOUNTER — Other Ambulatory Visit (HOSPITAL_COMMUNITY): Payer: Self-pay

## 2023-07-29 ENCOUNTER — Other Ambulatory Visit: Payer: Self-pay

## 2023-07-29 ENCOUNTER — Encounter: Payer: Medicare Other | Admitting: Sports Medicine

## 2023-07-29 DIAGNOSIS — R1311 Dysphagia, oral phase: Secondary | ICD-10-CM | POA: Diagnosis not present

## 2023-07-29 DIAGNOSIS — M6281 Muscle weakness (generalized): Secondary | ICD-10-CM | POA: Diagnosis not present

## 2023-07-29 DIAGNOSIS — R2689 Other abnormalities of gait and mobility: Secondary | ICD-10-CM | POA: Diagnosis not present

## 2023-07-29 DIAGNOSIS — R41841 Cognitive communication deficit: Secondary | ICD-10-CM | POA: Diagnosis not present

## 2023-07-29 DIAGNOSIS — F29 Unspecified psychosis not due to a substance or known physiological condition: Secondary | ICD-10-CM | POA: Diagnosis not present

## 2023-07-29 NOTE — Progress Notes (Signed)
   07/29/2023  Patient ID: [name], female   DOB: 22-Oct-1926, 89 y.o.   MRN: [id number]  Contact made with [name] from [facility] where patient was previously residing.  [name] is now at a SNF, and [name] is questioning if patient's medications can be returned to [name].  I informed her that I verified with the pharmacy that these cannot be taken back.  She does stake the SNF may agree to take them and use them for the patient.  Otherwise, I suggested a medication drop off location that will send unused medications to be destroyed.  I have also contacted [facility] to make sure they no longer fill/deliver patint's mediations.  This will now be handled by the SNF.    [name], PharmD, DPLA

## 2023-07-30 DIAGNOSIS — R41841 Cognitive communication deficit: Secondary | ICD-10-CM | POA: Diagnosis not present

## 2023-07-30 DIAGNOSIS — R1311 Dysphagia, oral phase: Secondary | ICD-10-CM | POA: Diagnosis not present

## 2023-07-30 DIAGNOSIS — M6281 Muscle weakness (generalized): Secondary | ICD-10-CM | POA: Diagnosis not present

## 2023-07-30 DIAGNOSIS — F29 Unspecified psychosis not due to a substance or known physiological condition: Secondary | ICD-10-CM | POA: Diagnosis not present

## 2023-07-31 DIAGNOSIS — R2689 Other abnormalities of gait and mobility: Secondary | ICD-10-CM | POA: Diagnosis not present

## 2023-07-31 DIAGNOSIS — F29 Unspecified psychosis not due to a substance or known physiological condition: Secondary | ICD-10-CM | POA: Diagnosis not present

## 2023-07-31 DIAGNOSIS — M6281 Muscle weakness (generalized): Secondary | ICD-10-CM | POA: Diagnosis not present

## 2023-07-31 NOTE — Progress Notes (Signed)
This encounter was created in error - please disregard.

## 2023-08-01 DIAGNOSIS — F29 Unspecified psychosis not due to a substance or known physiological condition: Secondary | ICD-10-CM | POA: Diagnosis not present

## 2023-08-01 DIAGNOSIS — R2689 Other abnormalities of gait and mobility: Secondary | ICD-10-CM | POA: Diagnosis not present

## 2023-08-01 DIAGNOSIS — M6281 Muscle weakness (generalized): Secondary | ICD-10-CM | POA: Diagnosis not present

## 2023-08-02 DIAGNOSIS — R2689 Other abnormalities of gait and mobility: Secondary | ICD-10-CM | POA: Diagnosis not present

## 2023-08-02 DIAGNOSIS — F29 Unspecified psychosis not due to a substance or known physiological condition: Secondary | ICD-10-CM | POA: Diagnosis not present

## 2023-08-02 DIAGNOSIS — M6281 Muscle weakness (generalized): Secondary | ICD-10-CM | POA: Diagnosis not present

## 2023-08-03 ENCOUNTER — Other Ambulatory Visit: Payer: Self-pay

## 2023-08-03 DIAGNOSIS — R2689 Other abnormalities of gait and mobility: Secondary | ICD-10-CM | POA: Diagnosis not present

## 2023-08-03 DIAGNOSIS — M6281 Muscle weakness (generalized): Secondary | ICD-10-CM | POA: Diagnosis not present

## 2023-08-03 DIAGNOSIS — F29 Unspecified psychosis not due to a substance or known physiological condition: Secondary | ICD-10-CM | POA: Diagnosis not present

## 2023-08-03 DIAGNOSIS — R1311 Dysphagia, oral phase: Secondary | ICD-10-CM | POA: Diagnosis not present

## 2023-08-03 DIAGNOSIS — M542 Cervicalgia: Secondary | ICD-10-CM | POA: Diagnosis not present

## 2023-08-03 DIAGNOSIS — G3184 Mild cognitive impairment, so stated: Secondary | ICD-10-CM | POA: Diagnosis not present

## 2023-08-03 DIAGNOSIS — M545 Low back pain, unspecified: Secondary | ICD-10-CM | POA: Diagnosis not present

## 2023-08-03 DIAGNOSIS — R41841 Cognitive communication deficit: Secondary | ICD-10-CM | POA: Diagnosis not present

## 2023-08-04 ENCOUNTER — Ambulatory Visit: Payer: Medicare Other | Admitting: Neurology

## 2023-08-04 DIAGNOSIS — F29 Unspecified psychosis not due to a substance or known physiological condition: Secondary | ICD-10-CM | POA: Diagnosis not present

## 2023-08-04 DIAGNOSIS — R2689 Other abnormalities of gait and mobility: Secondary | ICD-10-CM | POA: Diagnosis not present

## 2023-08-04 DIAGNOSIS — M6281 Muscle weakness (generalized): Secondary | ICD-10-CM | POA: Diagnosis not present

## 2023-08-04 DIAGNOSIS — S81811A Laceration without foreign body, right lower leg, initial encounter: Secondary | ICD-10-CM | POA: Diagnosis not present

## 2023-08-04 DIAGNOSIS — R296 Repeated falls: Secondary | ICD-10-CM | POA: Diagnosis not present

## 2023-08-04 DIAGNOSIS — R5381 Other malaise: Secondary | ICD-10-CM | POA: Diagnosis not present

## 2023-08-05 DIAGNOSIS — G3184 Mild cognitive impairment, so stated: Secondary | ICD-10-CM | POA: Diagnosis not present

## 2023-08-05 DIAGNOSIS — K219 Gastro-esophageal reflux disease without esophagitis: Secondary | ICD-10-CM | POA: Diagnosis not present

## 2023-08-05 DIAGNOSIS — M6281 Muscle weakness (generalized): Secondary | ICD-10-CM | POA: Diagnosis not present

## 2023-08-05 DIAGNOSIS — F331 Major depressive disorder, recurrent, moderate: Secondary | ICD-10-CM | POA: Diagnosis not present

## 2023-08-05 DIAGNOSIS — I10 Essential (primary) hypertension: Secondary | ICD-10-CM | POA: Diagnosis not present

## 2023-08-05 DIAGNOSIS — F29 Unspecified psychosis not due to a substance or known physiological condition: Secondary | ICD-10-CM | POA: Diagnosis not present

## 2023-08-06 DIAGNOSIS — R41841 Cognitive communication deficit: Secondary | ICD-10-CM | POA: Diagnosis not present

## 2023-08-06 DIAGNOSIS — F411 Generalized anxiety disorder: Secondary | ICD-10-CM | POA: Diagnosis not present

## 2023-08-06 DIAGNOSIS — F331 Major depressive disorder, recurrent, moderate: Secondary | ICD-10-CM | POA: Diagnosis not present

## 2023-08-06 DIAGNOSIS — R1311 Dysphagia, oral phase: Secondary | ICD-10-CM | POA: Diagnosis not present

## 2023-08-06 DIAGNOSIS — F29 Unspecified psychosis not due to a substance or known physiological condition: Secondary | ICD-10-CM | POA: Diagnosis not present

## 2023-08-06 DIAGNOSIS — M6281 Muscle weakness (generalized): Secondary | ICD-10-CM | POA: Diagnosis not present

## 2023-08-06 DIAGNOSIS — R2689 Other abnormalities of gait and mobility: Secondary | ICD-10-CM | POA: Diagnosis not present

## 2023-08-07 DIAGNOSIS — F29 Unspecified psychosis not due to a substance or known physiological condition: Secondary | ICD-10-CM | POA: Diagnosis not present

## 2023-08-07 DIAGNOSIS — R41841 Cognitive communication deficit: Secondary | ICD-10-CM | POA: Diagnosis not present

## 2023-08-07 DIAGNOSIS — R1311 Dysphagia, oral phase: Secondary | ICD-10-CM | POA: Diagnosis not present

## 2023-08-07 DIAGNOSIS — M6281 Muscle weakness (generalized): Secondary | ICD-10-CM | POA: Diagnosis not present

## 2023-08-08 DIAGNOSIS — F29 Unspecified psychosis not due to a substance or known physiological condition: Secondary | ICD-10-CM | POA: Diagnosis not present

## 2023-08-08 DIAGNOSIS — R2689 Other abnormalities of gait and mobility: Secondary | ICD-10-CM | POA: Diagnosis not present

## 2023-08-08 DIAGNOSIS — M6281 Muscle weakness (generalized): Secondary | ICD-10-CM | POA: Diagnosis not present

## 2023-08-09 DIAGNOSIS — R2689 Other abnormalities of gait and mobility: Secondary | ICD-10-CM | POA: Diagnosis not present

## 2023-08-09 DIAGNOSIS — M6281 Muscle weakness (generalized): Secondary | ICD-10-CM | POA: Diagnosis not present

## 2023-08-09 DIAGNOSIS — F29 Unspecified psychosis not due to a substance or known physiological condition: Secondary | ICD-10-CM | POA: Diagnosis not present

## 2023-08-10 DIAGNOSIS — F29 Unspecified psychosis not due to a substance or known physiological condition: Secondary | ICD-10-CM | POA: Diagnosis not present

## 2023-08-10 DIAGNOSIS — R41841 Cognitive communication deficit: Secondary | ICD-10-CM | POA: Diagnosis not present

## 2023-08-10 DIAGNOSIS — M542 Cervicalgia: Secondary | ICD-10-CM | POA: Diagnosis not present

## 2023-08-10 DIAGNOSIS — R1311 Dysphagia, oral phase: Secondary | ICD-10-CM | POA: Diagnosis not present

## 2023-08-10 DIAGNOSIS — R2689 Other abnormalities of gait and mobility: Secondary | ICD-10-CM | POA: Diagnosis not present

## 2023-08-10 DIAGNOSIS — M545 Low back pain, unspecified: Secondary | ICD-10-CM | POA: Diagnosis not present

## 2023-08-10 DIAGNOSIS — M6281 Muscle weakness (generalized): Secondary | ICD-10-CM | POA: Diagnosis not present

## 2023-08-11 DIAGNOSIS — M6281 Muscle weakness (generalized): Secondary | ICD-10-CM | POA: Diagnosis not present

## 2023-08-11 DIAGNOSIS — F29 Unspecified psychosis not due to a substance or known physiological condition: Secondary | ICD-10-CM | POA: Diagnosis not present

## 2023-08-11 DIAGNOSIS — R2689 Other abnormalities of gait and mobility: Secondary | ICD-10-CM | POA: Diagnosis not present

## 2023-08-13 DIAGNOSIS — G629 Polyneuropathy, unspecified: Secondary | ICD-10-CM | POA: Diagnosis not present

## 2023-08-13 DIAGNOSIS — M6281 Muscle weakness (generalized): Secondary | ICD-10-CM | POA: Diagnosis not present

## 2023-08-13 DIAGNOSIS — K219 Gastro-esophageal reflux disease without esophagitis: Secondary | ICD-10-CM | POA: Diagnosis not present

## 2023-08-13 DIAGNOSIS — F411 Generalized anxiety disorder: Secondary | ICD-10-CM | POA: Diagnosis not present

## 2023-08-13 DIAGNOSIS — I10 Essential (primary) hypertension: Secondary | ICD-10-CM | POA: Diagnosis not present

## 2023-08-13 DIAGNOSIS — F29 Unspecified psychosis not due to a substance or known physiological condition: Secondary | ICD-10-CM | POA: Diagnosis not present

## 2023-08-14 DIAGNOSIS — R41841 Cognitive communication deficit: Secondary | ICD-10-CM | POA: Diagnosis not present

## 2023-08-14 DIAGNOSIS — R1311 Dysphagia, oral phase: Secondary | ICD-10-CM | POA: Diagnosis not present

## 2023-08-14 DIAGNOSIS — R2689 Other abnormalities of gait and mobility: Secondary | ICD-10-CM | POA: Diagnosis not present

## 2023-08-14 DIAGNOSIS — F29 Unspecified psychosis not due to a substance or known physiological condition: Secondary | ICD-10-CM | POA: Diagnosis not present

## 2023-08-14 DIAGNOSIS — M545 Low back pain, unspecified: Secondary | ICD-10-CM | POA: Diagnosis not present

## 2023-08-14 DIAGNOSIS — M542 Cervicalgia: Secondary | ICD-10-CM | POA: Diagnosis not present

## 2023-08-14 DIAGNOSIS — M6281 Muscle weakness (generalized): Secondary | ICD-10-CM | POA: Diagnosis not present

## 2023-08-15 DIAGNOSIS — M6281 Muscle weakness (generalized): Secondary | ICD-10-CM | POA: Diagnosis not present

## 2023-08-15 DIAGNOSIS — R41841 Cognitive communication deficit: Secondary | ICD-10-CM | POA: Diagnosis not present

## 2023-08-15 DIAGNOSIS — F29 Unspecified psychosis not due to a substance or known physiological condition: Secondary | ICD-10-CM | POA: Diagnosis not present

## 2023-08-15 DIAGNOSIS — R1311 Dysphagia, oral phase: Secondary | ICD-10-CM | POA: Diagnosis not present

## 2023-08-15 DIAGNOSIS — R2689 Other abnormalities of gait and mobility: Secondary | ICD-10-CM | POA: Diagnosis not present

## 2023-08-16 DIAGNOSIS — F29 Unspecified psychosis not due to a substance or known physiological condition: Secondary | ICD-10-CM | POA: Diagnosis not present

## 2023-08-16 DIAGNOSIS — M6281 Muscle weakness (generalized): Secondary | ICD-10-CM | POA: Diagnosis not present

## 2023-08-17 DIAGNOSIS — M6281 Muscle weakness (generalized): Secondary | ICD-10-CM | POA: Diagnosis not present

## 2023-08-17 DIAGNOSIS — M545 Low back pain, unspecified: Secondary | ICD-10-CM | POA: Diagnosis not present

## 2023-08-17 DIAGNOSIS — M542 Cervicalgia: Secondary | ICD-10-CM | POA: Diagnosis not present

## 2023-08-17 DIAGNOSIS — R41841 Cognitive communication deficit: Secondary | ICD-10-CM | POA: Diagnosis not present

## 2023-08-17 DIAGNOSIS — F29 Unspecified psychosis not due to a substance or known physiological condition: Secondary | ICD-10-CM | POA: Diagnosis not present

## 2023-08-17 DIAGNOSIS — R1311 Dysphagia, oral phase: Secondary | ICD-10-CM | POA: Diagnosis not present

## 2023-08-18 DIAGNOSIS — F29 Unspecified psychosis not due to a substance or known physiological condition: Secondary | ICD-10-CM | POA: Diagnosis not present

## 2023-08-18 DIAGNOSIS — R2689 Other abnormalities of gait and mobility: Secondary | ICD-10-CM | POA: Diagnosis not present

## 2023-08-18 DIAGNOSIS — M6281 Muscle weakness (generalized): Secondary | ICD-10-CM | POA: Diagnosis not present

## 2023-08-20 DIAGNOSIS — K219 Gastro-esophageal reflux disease without esophagitis: Secondary | ICD-10-CM | POA: Diagnosis not present

## 2023-08-20 DIAGNOSIS — R5381 Other malaise: Secondary | ICD-10-CM | POA: Diagnosis not present

## 2023-08-20 DIAGNOSIS — G3184 Mild cognitive impairment, so stated: Secondary | ICD-10-CM | POA: Diagnosis not present

## 2023-08-20 DIAGNOSIS — R296 Repeated falls: Secondary | ICD-10-CM | POA: Diagnosis not present

## 2023-08-20 DIAGNOSIS — S81811A Laceration without foreign body, right lower leg, initial encounter: Secondary | ICD-10-CM | POA: Diagnosis not present

## 2023-08-20 DIAGNOSIS — I10 Essential (primary) hypertension: Secondary | ICD-10-CM | POA: Diagnosis not present

## 2023-08-21 DIAGNOSIS — F331 Major depressive disorder, recurrent, moderate: Secondary | ICD-10-CM | POA: Diagnosis not present

## 2023-08-21 DIAGNOSIS — F411 Generalized anxiety disorder: Secondary | ICD-10-CM | POA: Diagnosis not present

## 2023-08-25 ENCOUNTER — Other Ambulatory Visit (HOSPITAL_COMMUNITY): Payer: Self-pay

## 2023-08-25 ENCOUNTER — Other Ambulatory Visit: Payer: Self-pay

## 2023-08-25 NOTE — Progress Notes (Signed)
   08/25/2023  Patient ID: [name], female   DOB: [birth year], 89 y.o.   MRN: [id number]  Received a voicemail from Orie Faster at [facility] -where patient previously resided- stating she received notification [facility] was processing prescriptions for [name].  Ms. Firman is now at SNF that handles her medications, so I had informed [facility] of this 12/11 to make sure they did not process/delivery any medications moving forward.  Collaborated with PharmD to stop filling of the 2 that appear to be in process currently and ensure no future fills are done/delivered.  Contacted Orie to make her aware of this.  Channing DELENA Mealing, PharmD, DPLA

## 2023-09-08 ENCOUNTER — Emergency Department (HOSPITAL_COMMUNITY)
Admission: EM | Admit: 2023-09-08 | Discharge: 2023-09-08 | Disposition: A | Discharge status: Skilled Nursing Facility | Payer: Medicare Other | Attending: Emergency Medicine | Admitting: Emergency Medicine

## 2023-09-08 ENCOUNTER — Encounter (HOSPITAL_COMMUNITY): Payer: Self-pay | Admitting: Emergency Medicine

## 2023-09-08 ENCOUNTER — Emergency Department (HOSPITAL_COMMUNITY): Payer: Medicare Other

## 2023-09-08 DIAGNOSIS — R296 Repeated falls: Secondary | ICD-10-CM | POA: Diagnosis not present

## 2023-09-08 DIAGNOSIS — S0083XA Contusion of other part of head, initial encounter: Secondary | ICD-10-CM | POA: Diagnosis not present

## 2023-09-08 DIAGNOSIS — S060XAS Concussion with loss of consciousness status unknown, sequela: Secondary | ICD-10-CM | POA: Diagnosis not present

## 2023-09-08 DIAGNOSIS — M4322 Fusion of spine, cervical region: Secondary | ICD-10-CM | POA: Diagnosis not present

## 2023-09-08 DIAGNOSIS — S12000A Unspecified displaced fracture of first cervical vertebra, initial encounter for closed fracture: Secondary | ICD-10-CM | POA: Diagnosis not present

## 2023-09-08 DIAGNOSIS — G629 Polyneuropathy, unspecified: Secondary | ICD-10-CM | POA: Diagnosis not present

## 2023-09-08 DIAGNOSIS — M47812 Spondylosis without myelopathy or radiculopathy, cervical region: Secondary | ICD-10-CM | POA: Diagnosis not present

## 2023-09-08 DIAGNOSIS — S0990XA Unspecified injury of head, initial encounter: Secondary | ICD-10-CM | POA: Diagnosis not present

## 2023-09-08 DIAGNOSIS — I10 Essential (primary) hypertension: Secondary | ICD-10-CM | POA: Diagnosis not present

## 2023-09-08 DIAGNOSIS — M4312 Spondylolisthesis, cervical region: Secondary | ICD-10-CM | POA: Diagnosis not present

## 2023-09-08 DIAGNOSIS — R58 Hemorrhage, not elsewhere classified: Secondary | ICD-10-CM | POA: Diagnosis not present

## 2023-09-08 DIAGNOSIS — S0003XA Contusion of scalp, initial encounter: Secondary | ICD-10-CM | POA: Diagnosis not present

## 2023-09-08 DIAGNOSIS — S060X0A Concussion without loss of consciousness, initial encounter: Secondary | ICD-10-CM | POA: Insufficient documentation

## 2023-09-08 DIAGNOSIS — W19XXXA Unspecified fall, initial encounter: Secondary | ICD-10-CM | POA: Insufficient documentation

## 2023-09-08 NOTE — ED Triage Notes (Signed)
Pt had witnessed fall backward with hematoma on back of head. No thinners. DNR and MOST. No treatment per paperwork desired by family. Pt is hysterical and screaming. Demanding co collar be removed and trying to take it off.

## 2023-09-08 NOTE — ED Notes (Signed)
Called [facility] for PT Pick up!

## 2023-09-08 NOTE — ED Provider Notes (Signed)
[place] EMERGENCY DEPARTMENT AT [facility] Provider Note   CSN: [id number] Arrival date & time: 09/08/23  0244     History  Chief Complaint  Patient presents with   [name] is a 89 y.o. female.  The history is provided by the patient.  Patient presents from nursing facility after a fall.  Patient fell backwards striking her head.  No LOC, she is not on anticoagulation.  Patient arrives via EMS with c-collar in place demanded it be removed    Past Medical History:  Diagnosis Date   Arthritis    Osteoarthritis   Depression    GERD (gastroesophageal reflux disease)    Hyperlipidemia    Osteopenia     Home Medications Prior to Admission medications   Medication Sig Start Date End Date Taking? Authorizing Provider  ADVIL 200 MG CAPS Take 200 mg by mouth every 8 (eight) hours as needed (for pain or headaches).    [provider]  BIOTIN PO Take 1 capsule by mouth daily.    [provider]  brimonidine (ALPHAGAN) 0.2 % ophthalmic solution Place 1 drop into the right eye 2 (two) times daily. 12/02/22     dorzolamide-timolol (COSOPT) 2-0.5 % ophthalmic solution Place 1 drop into the right eye 2 (two) times daily. 12/02/22     gabapentin (NEURONTIN) 300 MG capsule Take 1 capsule (300 mg total) by mouth 2 (two) times daily. Patient taking differently: Take 300-600 mg by mouth See admin instructions. Take 300 mg by mouth in the morning and 300-600 mg by mouth at bedtime. 04/27/23   Venita Sheffield, MD  lisinopril (ZESTRIL) 10 MG tablet Take 1 tablet (10 mg total) by mouth daily. 04/29/23   Venita Sheffield, MD  Netarsudil-Latanoprost (ROCKLATAN) 0.02-0.005 % SOLN Place 1 drop into the right eye every evening. 12/02/22     omeprazole (PRILOSEC) 20 MG capsule Take 1 capsule (20 mg total) by mouth daily. 04/27/23   Venita Sheffield, MD  QUEtiapine (SEROQUEL XR) 50 MG TB24 24 hr tablet Take 1 tablet (50 mg total) by mouth daily. 04/29/23    Venita Sheffield, MD  sertraline (ZOLOFT) 100 MG tablet Take 1 tablet (100 mg total) by mouth at bedtime. 01/27/23   Frederica Kuster, MD      Allergies    Penicillins    Review of Systems   Review of Systems  Neurological:  Positive for headaches.    Physical Exam Updated Vital Signs BP 119/65 (BP Location: Right Arm)   Pulse 72   Temp 97.6 F (36.4 C) (Oral)   Resp 17   SpO2 97%  Physical Exam CONSTITUTIONAL: Elderly, mildly agitated HEAD: Posterior hematomas noted ENMT: Mucous membranes moist NECK: supple no meningeal signs SPINE/BACK: Kyphotic spine No bruising/crepitance/stepoffs noted to spine CV: S1/S2 noted, no murmurs/rubs/gallops noted LUNGS: Lungs are clear to auscultation bilaterally, no apparent distress ABDOMEN: soft NEURO: Pt is awake/alert/appropriate, moves all extremitiesx4.  No facial droop.   EXTREMITIES: pulses normal/equal, full ROM, all other extremities/joints palpated/ranged and nontender SKIN: warm, color normal PSYCH: Anxious ED Results / Procedures / Treatments   Labs (all labs ordered are listed, but only abnormal results are displayed) Labs Reviewed - No data to display  EKG None  Radiology CT Cervical Spine Wo Contrast Result Date: 09/08/2023 CLINICAL DATA:  89 year old female status post witnessed fall backwards. Scalp hematoma. EXAM: CT CERVICAL SPINE WITHOUT CONTRAST TECHNIQUE: Multidetector CT imaging of the cervical spine was performed without intravenous contrast.  Multiplanar CT image reconstructions were also generated. RADIATION DOSE REDUCTION: This exam was performed according to the departmental dose-optimization program which includes automated exposure control, adjustment of the mA and/or kV according to patient size and/or use of iterative reconstruction technique. COMPARISON:  Head CT today reported separately. Cervical spine CT 06/25/2023. FINDINGS: Mild motion artifact today. Alignment: Stable. Maintained upper cervical  lordosis chronic degenerative anterolisthesis C5-C6 through C7-T1. Posterior element alignment appears stable. Skull base and vertebrae: Intermittent motion artifact. Stable bone mineralization. Visualized skull base is intact. No atlanto-occipital dissociation. Chronic and un healed C1 ring fractures appear stable since November (series 5, image 25). C1-C2 alignment is stable, maintained. No acute osseous abnormality is identified. Postoperative details are below. Soft tissues and spinal canal: No prevertebral fluid or swelling. No visible canal hematoma. Stable visible noncontrast neck soft tissues when allowing for some motion today. Disc levels: Chronic ACDF C3 through C5 with solid arthrodesis, and superimposed bilateral C2-C3 facet ankylosis, better demonstrated on the previous exam due to motion artifact today. Chronic C1-C2 degeneration. Chronic lower cervical spine degeneration at the unfused levels with spondylolisthesis and facet arthropathy. Upper chest: Visible upper thoracic levels appear grossly intact. Lung apices are clear. IMPRESSION: 1. Motion artifact today. No acute traumatic injury identified in the cervical spine. 2. Chronic unhealed C1 ring fractures, chronic cervical spine fusion and degeneration appear stable since November. Electronically Signed   By: Odessa Fleming M.D.   On: 09/08/2023 04:59   CT Head Wo Contrast Result Date: 09/08/2023 CLINICAL DATA:  89 year old female status post witnessed fall backwards. Posterior head hematoma. EXAM: CT HEAD WITHOUT CONTRAST TECHNIQUE: Contiguous axial images were obtained from the base of the skull through the vertex without intravenous contrast. RADIATION DOSE REDUCTION: This exam was performed according to the departmental dose-optimization program which includes automated exposure control, adjustment of the mA and/or kV according to patient size and/or use of iterative reconstruction technique. COMPARISON:  Head CT 06/25/2023. FINDINGS: Brain:  Stable cerebral volume. No midline shift, mass effect, or evidence of intracranial mass lesion. Stable ventricle size and configuration. Confluent bilateral cerebral white matter hypodensity redemonstrated. Stable basal ganglia vascular calcifications. No acute intracranial hemorrhage identified. No cortically based acute infarct identified. Vascular: No suspicious intracranial vascular hyperdensity. Calcified atherosclerosis at the skull base. Skull: Stable visualized osseous structures. No skull fracture identified. Sinuses/Orbits: Stable paranasal sinus aeration with chronic right sphenoid mucoperiosteal thickening. Tympanic cavities remain clear but new bilateral mastoid effusions since November, mild. Other: Grossly negative visible nasopharynx. Large left posterior convexity scalp hematoma up to 2.2 cm in thickness. No scalp soft tissue gas. Underlying calvarium appears stable and intact. Stable orbits soft tissues. IMPRESSION: 1. Large left posterior scalp hematoma (up to 2.2 cm in thickness). No skull fracture identified. 2. No acute intracranial abnormality. Stable non contrast CT appearance of the brain. 3. Mild new mastoid effusions since November, nonspecific but frequently postinflammatory. Electronically Signed   By: Odessa Fleming M.D.   On: 09/08/2023 04:53    Procedures Procedures    Medications Ordered in ED Medications - No data to display  ED Course/ Medical Decision Making/ A&P Clinical Course as of 09/08/23 0508  Tue Sep 08, 2023  0324 Patient presents from facility after a witnessed fall striking the back of her head.  She is not anticoagulated.  Patient is awake and alert and is mildly agitated.  I did remove her c-collar.  Will obtain CT head and C-spine though no other signs of trauma besides the scalp hematoma  Patient is a DNR [DW]  [phone] Patient stable in the emergency department.  No acute traumatic injuries to head or neck.  Patient multiple chronic findings.  Patient will be  discharged [DW]    Clinical Course User Index [DW] Zadie Rhine, MD                                 Medical Decision Making Amount and/or Complexity of Data Reviewed Radiology: ordered.   This patient presents to the ED for concern of head injury, this involves an extensive number of treatment options, and is a complaint that carries with it a high risk of complications and morbidity.  The differential diagnosis includes but is not limited to subdural hematoma, subarachnoid hemorrhage, skull fracture, concussion    Comorbidities that complicate the patient evaluation: Patient's presentation is complicated by their history of hypertension  Social Determinants of Health: Patient's  poor mobility   increases the complexity of managing their presentation   Imaging Studies ordered: I ordered imaging studies including CT scan head and C-spine   I independently visualized and interpreted imaging which showed no acute traumatic injuries I agree with the radiologist interpretation   Reevaluation: After the interventions noted above, I reevaluated the patient and found that they have :stayed the same  Complexity of problems addressed: Patient's presentation is most consistent with  acute presentation with potential threat to life or bodily function  Disposition: After consideration of the diagnostic results and the patient's response to treatment,  I feel that the patent would benefit from discharge   .           Final Clinical Impression(s) / ED Diagnoses Final diagnoses:  Concussion without loss of consciousness, initial encounter    Rx / DC Orders ED Discharge Orders     None         Zadie Rhine, MD 09/08/23 [phone]

## 2023-09-08 NOTE — ED Notes (Signed)
C-collar removed by EDP.

## 2023-09-13 DIAGNOSIS — R296 Repeated falls: Secondary | ICD-10-CM | POA: Diagnosis not present

## 2023-09-15 DIAGNOSIS — G3184 Mild cognitive impairment, so stated: Secondary | ICD-10-CM | POA: Diagnosis not present

## 2023-09-15 DIAGNOSIS — K219 Gastro-esophageal reflux disease without esophagitis: Secondary | ICD-10-CM | POA: Diagnosis not present

## 2023-09-15 DIAGNOSIS — I10 Essential (primary) hypertension: Secondary | ICD-10-CM | POA: Diagnosis not present

## 2023-09-15 DIAGNOSIS — F411 Generalized anxiety disorder: Secondary | ICD-10-CM | POA: Diagnosis not present

## 2023-09-16 DIAGNOSIS — H47293 Other optic atrophy, bilateral: Secondary | ICD-10-CM | POA: Diagnosis not present

## 2023-09-16 DIAGNOSIS — H401134 Primary open-angle glaucoma, bilateral, indeterminate stage: Secondary | ICD-10-CM | POA: Diagnosis not present

## 2023-09-19 DIAGNOSIS — M6281 Muscle weakness (generalized): Secondary | ICD-10-CM | POA: Diagnosis not present

## 2023-09-19 DIAGNOSIS — R1313 Dysphagia, pharyngeal phase: Secondary | ICD-10-CM | POA: Diagnosis not present

## 2023-09-19 DIAGNOSIS — F29 Unspecified psychosis not due to a substance or known physiological condition: Secondary | ICD-10-CM | POA: Diagnosis not present

## 2023-09-19 DIAGNOSIS — R41841 Cognitive communication deficit: Secondary | ICD-10-CM | POA: Diagnosis not present

## 2023-09-19 DIAGNOSIS — R2689 Other abnormalities of gait and mobility: Secondary | ICD-10-CM | POA: Diagnosis not present

## 2023-09-19 DIAGNOSIS — I69991 Dysphagia following unspecified cerebrovascular disease: Secondary | ICD-10-CM | POA: Diagnosis not present

## 2023-09-19 DIAGNOSIS — R1311 Dysphagia, oral phase: Secondary | ICD-10-CM | POA: Diagnosis not present

## 2023-09-20 DIAGNOSIS — R1311 Dysphagia, oral phase: Secondary | ICD-10-CM | POA: Diagnosis not present

## 2023-09-20 DIAGNOSIS — F29 Unspecified psychosis not due to a substance or known physiological condition: Secondary | ICD-10-CM | POA: Diagnosis not present

## 2023-09-20 DIAGNOSIS — R41841 Cognitive communication deficit: Secondary | ICD-10-CM | POA: Diagnosis not present

## 2023-09-20 DIAGNOSIS — M6281 Muscle weakness (generalized): Secondary | ICD-10-CM | POA: Diagnosis not present

## 2023-09-20 DIAGNOSIS — R2689 Other abnormalities of gait and mobility: Secondary | ICD-10-CM | POA: Diagnosis not present

## 2023-09-20 DIAGNOSIS — R1313 Dysphagia, pharyngeal phase: Secondary | ICD-10-CM | POA: Diagnosis not present

## 2023-09-20 DIAGNOSIS — I69991 Dysphagia following unspecified cerebrovascular disease: Secondary | ICD-10-CM | POA: Diagnosis not present

## 2023-09-21 DIAGNOSIS — F29 Unspecified psychosis not due to a substance or known physiological condition: Secondary | ICD-10-CM | POA: Diagnosis not present

## 2023-09-21 DIAGNOSIS — R1311 Dysphagia, oral phase: Secondary | ICD-10-CM | POA: Diagnosis not present

## 2023-09-21 DIAGNOSIS — M6281 Muscle weakness (generalized): Secondary | ICD-10-CM | POA: Diagnosis not present

## 2023-09-21 DIAGNOSIS — R41841 Cognitive communication deficit: Secondary | ICD-10-CM | POA: Diagnosis not present

## 2023-09-21 DIAGNOSIS — I69991 Dysphagia following unspecified cerebrovascular disease: Secondary | ICD-10-CM | POA: Diagnosis not present

## 2023-09-21 DIAGNOSIS — R1313 Dysphagia, pharyngeal phase: Secondary | ICD-10-CM | POA: Diagnosis not present

## 2023-09-21 DIAGNOSIS — R2689 Other abnormalities of gait and mobility: Secondary | ICD-10-CM | POA: Diagnosis not present

## 2023-09-22 DIAGNOSIS — R2689 Other abnormalities of gait and mobility: Secondary | ICD-10-CM | POA: Diagnosis not present

## 2023-09-22 DIAGNOSIS — R1313 Dysphagia, pharyngeal phase: Secondary | ICD-10-CM | POA: Diagnosis not present

## 2023-09-22 DIAGNOSIS — R41841 Cognitive communication deficit: Secondary | ICD-10-CM | POA: Diagnosis not present

## 2023-09-22 DIAGNOSIS — R1311 Dysphagia, oral phase: Secondary | ICD-10-CM | POA: Diagnosis not present

## 2023-09-22 DIAGNOSIS — M6281 Muscle weakness (generalized): Secondary | ICD-10-CM | POA: Diagnosis not present

## 2023-09-22 DIAGNOSIS — I69991 Dysphagia following unspecified cerebrovascular disease: Secondary | ICD-10-CM | POA: Diagnosis not present

## 2023-09-22 DIAGNOSIS — F29 Unspecified psychosis not due to a substance or known physiological condition: Secondary | ICD-10-CM | POA: Diagnosis not present

## 2023-09-23 DIAGNOSIS — R41841 Cognitive communication deficit: Secondary | ICD-10-CM | POA: Diagnosis not present

## 2023-09-23 DIAGNOSIS — M6281 Muscle weakness (generalized): Secondary | ICD-10-CM | POA: Diagnosis not present

## 2023-09-23 DIAGNOSIS — R2689 Other abnormalities of gait and mobility: Secondary | ICD-10-CM | POA: Diagnosis not present

## 2023-09-23 DIAGNOSIS — I69991 Dysphagia following unspecified cerebrovascular disease: Secondary | ICD-10-CM | POA: Diagnosis not present

## 2023-09-23 DIAGNOSIS — R1313 Dysphagia, pharyngeal phase: Secondary | ICD-10-CM | POA: Diagnosis not present

## 2023-09-23 DIAGNOSIS — R1311 Dysphagia, oral phase: Secondary | ICD-10-CM | POA: Diagnosis not present

## 2023-09-23 DIAGNOSIS — F29 Unspecified psychosis not due to a substance or known physiological condition: Secondary | ICD-10-CM | POA: Diagnosis not present

## 2023-09-24 DIAGNOSIS — F29 Unspecified psychosis not due to a substance or known physiological condition: Secondary | ICD-10-CM | POA: Diagnosis not present

## 2023-09-24 DIAGNOSIS — I69991 Dysphagia following unspecified cerebrovascular disease: Secondary | ICD-10-CM | POA: Diagnosis not present

## 2023-09-24 DIAGNOSIS — R1313 Dysphagia, pharyngeal phase: Secondary | ICD-10-CM | POA: Diagnosis not present

## 2023-09-24 DIAGNOSIS — R296 Repeated falls: Secondary | ICD-10-CM | POA: Diagnosis not present

## 2023-09-24 DIAGNOSIS — R1311 Dysphagia, oral phase: Secondary | ICD-10-CM | POA: Diagnosis not present

## 2023-09-24 DIAGNOSIS — M6281 Muscle weakness (generalized): Secondary | ICD-10-CM | POA: Diagnosis not present

## 2023-09-24 DIAGNOSIS — E785 Hyperlipidemia, unspecified: Secondary | ICD-10-CM | POA: Diagnosis not present

## 2023-09-24 DIAGNOSIS — K219 Gastro-esophageal reflux disease without esophagitis: Secondary | ICD-10-CM | POA: Diagnosis not present

## 2023-09-24 DIAGNOSIS — R41841 Cognitive communication deficit: Secondary | ICD-10-CM | POA: Diagnosis not present

## 2023-09-24 DIAGNOSIS — F32A Depression, unspecified: Secondary | ICD-10-CM | POA: Diagnosis not present

## 2023-09-24 DIAGNOSIS — R2689 Other abnormalities of gait and mobility: Secondary | ICD-10-CM | POA: Diagnosis not present

## 2023-09-28 DIAGNOSIS — R41841 Cognitive communication deficit: Secondary | ICD-10-CM | POA: Diagnosis not present

## 2023-09-28 DIAGNOSIS — R1313 Dysphagia, pharyngeal phase: Secondary | ICD-10-CM | POA: Diagnosis not present

## 2023-09-28 DIAGNOSIS — R2689 Other abnormalities of gait and mobility: Secondary | ICD-10-CM | POA: Diagnosis not present

## 2023-09-28 DIAGNOSIS — I69991 Dysphagia following unspecified cerebrovascular disease: Secondary | ICD-10-CM | POA: Diagnosis not present

## 2023-09-28 DIAGNOSIS — M6281 Muscle weakness (generalized): Secondary | ICD-10-CM | POA: Diagnosis not present

## 2023-09-28 DIAGNOSIS — F29 Unspecified psychosis not due to a substance or known physiological condition: Secondary | ICD-10-CM | POA: Diagnosis not present

## 2023-09-28 DIAGNOSIS — R1311 Dysphagia, oral phase: Secondary | ICD-10-CM | POA: Diagnosis not present

## 2023-09-29 DIAGNOSIS — R1313 Dysphagia, pharyngeal phase: Secondary | ICD-10-CM | POA: Diagnosis not present

## 2023-09-29 DIAGNOSIS — R1311 Dysphagia, oral phase: Secondary | ICD-10-CM | POA: Diagnosis not present

## 2023-09-29 DIAGNOSIS — R41841 Cognitive communication deficit: Secondary | ICD-10-CM | POA: Diagnosis not present

## 2023-09-29 DIAGNOSIS — F29 Unspecified psychosis not due to a substance or known physiological condition: Secondary | ICD-10-CM | POA: Diagnosis not present

## 2023-09-29 DIAGNOSIS — I69991 Dysphagia following unspecified cerebrovascular disease: Secondary | ICD-10-CM | POA: Diagnosis not present

## 2023-09-29 DIAGNOSIS — R2689 Other abnormalities of gait and mobility: Secondary | ICD-10-CM | POA: Diagnosis not present

## 2023-09-29 DIAGNOSIS — M6281 Muscle weakness (generalized): Secondary | ICD-10-CM | POA: Diagnosis not present

## 2023-09-30 DIAGNOSIS — R2689 Other abnormalities of gait and mobility: Secondary | ICD-10-CM | POA: Diagnosis not present

## 2023-09-30 DIAGNOSIS — R1311 Dysphagia, oral phase: Secondary | ICD-10-CM | POA: Diagnosis not present

## 2023-09-30 DIAGNOSIS — K219 Gastro-esophageal reflux disease without esophagitis: Secondary | ICD-10-CM | POA: Diagnosis not present

## 2023-09-30 DIAGNOSIS — F331 Major depressive disorder, recurrent, moderate: Secondary | ICD-10-CM | POA: Diagnosis not present

## 2023-09-30 DIAGNOSIS — R1313 Dysphagia, pharyngeal phase: Secondary | ICD-10-CM | POA: Diagnosis not present

## 2023-09-30 DIAGNOSIS — F411 Generalized anxiety disorder: Secondary | ICD-10-CM | POA: Diagnosis not present

## 2023-09-30 DIAGNOSIS — I69991 Dysphagia following unspecified cerebrovascular disease: Secondary | ICD-10-CM | POA: Diagnosis not present

## 2023-09-30 DIAGNOSIS — G3184 Mild cognitive impairment, so stated: Secondary | ICD-10-CM | POA: Diagnosis not present

## 2023-09-30 DIAGNOSIS — M6281 Muscle weakness (generalized): Secondary | ICD-10-CM | POA: Diagnosis not present

## 2023-09-30 DIAGNOSIS — R41841 Cognitive communication deficit: Secondary | ICD-10-CM | POA: Diagnosis not present

## 2023-09-30 DIAGNOSIS — I10 Essential (primary) hypertension: Secondary | ICD-10-CM | POA: Diagnosis not present

## 2023-09-30 DIAGNOSIS — F29 Unspecified psychosis not due to a substance or known physiological condition: Secondary | ICD-10-CM | POA: Diagnosis not present

## 2023-10-02 DIAGNOSIS — F331 Major depressive disorder, recurrent, moderate: Secondary | ICD-10-CM | POA: Diagnosis not present

## 2023-10-02 DIAGNOSIS — F29 Unspecified psychosis not due to a substance or known physiological condition: Secondary | ICD-10-CM | POA: Diagnosis not present

## 2023-10-02 DIAGNOSIS — R41841 Cognitive communication deficit: Secondary | ICD-10-CM | POA: Diagnosis not present

## 2023-10-02 DIAGNOSIS — M6281 Muscle weakness (generalized): Secondary | ICD-10-CM | POA: Diagnosis not present

## 2023-10-02 DIAGNOSIS — R451 Restlessness and agitation: Secondary | ICD-10-CM | POA: Diagnosis not present

## 2023-10-02 DIAGNOSIS — F411 Generalized anxiety disorder: Secondary | ICD-10-CM | POA: Diagnosis not present

## 2023-10-02 DIAGNOSIS — R1313 Dysphagia, pharyngeal phase: Secondary | ICD-10-CM | POA: Diagnosis not present

## 2023-10-02 DIAGNOSIS — I69991 Dysphagia following unspecified cerebrovascular disease: Secondary | ICD-10-CM | POA: Diagnosis not present

## 2023-10-02 DIAGNOSIS — R2689 Other abnormalities of gait and mobility: Secondary | ICD-10-CM | POA: Diagnosis not present

## 2023-10-02 DIAGNOSIS — R1311 Dysphagia, oral phase: Secondary | ICD-10-CM | POA: Diagnosis not present

## 2023-10-05 DIAGNOSIS — M6281 Muscle weakness (generalized): Secondary | ICD-10-CM | POA: Diagnosis not present

## 2023-10-05 DIAGNOSIS — R41841 Cognitive communication deficit: Secondary | ICD-10-CM | POA: Diagnosis not present

## 2023-10-05 DIAGNOSIS — G3184 Mild cognitive impairment, so stated: Secondary | ICD-10-CM | POA: Diagnosis not present

## 2023-10-05 DIAGNOSIS — F411 Generalized anxiety disorder: Secondary | ICD-10-CM | POA: Diagnosis not present

## 2023-10-05 DIAGNOSIS — R2689 Other abnormalities of gait and mobility: Secondary | ICD-10-CM | POA: Diagnosis not present

## 2023-10-05 DIAGNOSIS — R1313 Dysphagia, pharyngeal phase: Secondary | ICD-10-CM | POA: Diagnosis not present

## 2023-10-05 DIAGNOSIS — R296 Repeated falls: Secondary | ICD-10-CM | POA: Diagnosis not present

## 2023-10-05 DIAGNOSIS — I10 Essential (primary) hypertension: Secondary | ICD-10-CM | POA: Diagnosis not present

## 2023-10-05 DIAGNOSIS — F29 Unspecified psychosis not due to a substance or known physiological condition: Secondary | ICD-10-CM | POA: Diagnosis not present

## 2023-10-05 DIAGNOSIS — R1311 Dysphagia, oral phase: Secondary | ICD-10-CM | POA: Diagnosis not present

## 2023-10-05 DIAGNOSIS — I69991 Dysphagia following unspecified cerebrovascular disease: Secondary | ICD-10-CM | POA: Diagnosis not present

## 2023-10-05 DIAGNOSIS — N390 Urinary tract infection, site not specified: Secondary | ICD-10-CM | POA: Diagnosis not present

## 2023-10-06 DIAGNOSIS — R2689 Other abnormalities of gait and mobility: Secondary | ICD-10-CM | POA: Diagnosis not present

## 2023-10-06 DIAGNOSIS — R1311 Dysphagia, oral phase: Secondary | ICD-10-CM | POA: Diagnosis not present

## 2023-10-06 DIAGNOSIS — F29 Unspecified psychosis not due to a substance or known physiological condition: Secondary | ICD-10-CM | POA: Diagnosis not present

## 2023-10-06 DIAGNOSIS — R1313 Dysphagia, pharyngeal phase: Secondary | ICD-10-CM | POA: Diagnosis not present

## 2023-10-06 DIAGNOSIS — M6281 Muscle weakness (generalized): Secondary | ICD-10-CM | POA: Diagnosis not present

## 2023-10-06 DIAGNOSIS — R41841 Cognitive communication deficit: Secondary | ICD-10-CM | POA: Diagnosis not present

## 2023-10-06 DIAGNOSIS — I69991 Dysphagia following unspecified cerebrovascular disease: Secondary | ICD-10-CM | POA: Diagnosis not present

## 2023-10-07 DIAGNOSIS — R2689 Other abnormalities of gait and mobility: Secondary | ICD-10-CM | POA: Diagnosis not present

## 2023-10-07 DIAGNOSIS — F29 Unspecified psychosis not due to a substance or known physiological condition: Secondary | ICD-10-CM | POA: Diagnosis not present

## 2023-10-07 DIAGNOSIS — I69991 Dysphagia following unspecified cerebrovascular disease: Secondary | ICD-10-CM | POA: Diagnosis not present

## 2023-10-07 DIAGNOSIS — R41841 Cognitive communication deficit: Secondary | ICD-10-CM | POA: Diagnosis not present

## 2023-10-07 DIAGNOSIS — R1311 Dysphagia, oral phase: Secondary | ICD-10-CM | POA: Diagnosis not present

## 2023-10-07 DIAGNOSIS — M6281 Muscle weakness (generalized): Secondary | ICD-10-CM | POA: Diagnosis not present

## 2023-10-07 DIAGNOSIS — R1313 Dysphagia, pharyngeal phase: Secondary | ICD-10-CM | POA: Diagnosis not present

## 2023-10-08 DIAGNOSIS — R41841 Cognitive communication deficit: Secondary | ICD-10-CM | POA: Diagnosis not present

## 2023-10-08 DIAGNOSIS — I10 Essential (primary) hypertension: Secondary | ICD-10-CM | POA: Diagnosis not present

## 2023-10-08 DIAGNOSIS — G629 Polyneuropathy, unspecified: Secondary | ICD-10-CM | POA: Diagnosis not present

## 2023-10-08 DIAGNOSIS — I69991 Dysphagia following unspecified cerebrovascular disease: Secondary | ICD-10-CM | POA: Diagnosis not present

## 2023-10-08 DIAGNOSIS — F29 Unspecified psychosis not due to a substance or known physiological condition: Secondary | ICD-10-CM | POA: Diagnosis not present

## 2023-10-08 DIAGNOSIS — M6281 Muscle weakness (generalized): Secondary | ICD-10-CM | POA: Diagnosis not present

## 2023-10-08 DIAGNOSIS — R1313 Dysphagia, pharyngeal phase: Secondary | ICD-10-CM | POA: Diagnosis not present

## 2023-10-08 DIAGNOSIS — R1311 Dysphagia, oral phase: Secondary | ICD-10-CM | POA: Diagnosis not present

## 2023-10-08 DIAGNOSIS — R2689 Other abnormalities of gait and mobility: Secondary | ICD-10-CM | POA: Diagnosis not present

## 2023-10-08 DIAGNOSIS — K219 Gastro-esophageal reflux disease without esophagitis: Secondary | ICD-10-CM | POA: Diagnosis not present

## 2023-10-08 DIAGNOSIS — N390 Urinary tract infection, site not specified: Secondary | ICD-10-CM | POA: Diagnosis not present

## 2023-10-09 DIAGNOSIS — R1313 Dysphagia, pharyngeal phase: Secondary | ICD-10-CM | POA: Diagnosis not present

## 2023-10-09 DIAGNOSIS — I69991 Dysphagia following unspecified cerebrovascular disease: Secondary | ICD-10-CM | POA: Diagnosis not present

## 2023-10-09 DIAGNOSIS — M6281 Muscle weakness (generalized): Secondary | ICD-10-CM | POA: Diagnosis not present

## 2023-10-09 DIAGNOSIS — R41841 Cognitive communication deficit: Secondary | ICD-10-CM | POA: Diagnosis not present

## 2023-10-09 DIAGNOSIS — R2689 Other abnormalities of gait and mobility: Secondary | ICD-10-CM | POA: Diagnosis not present

## 2023-10-09 DIAGNOSIS — F29 Unspecified psychosis not due to a substance or known physiological condition: Secondary | ICD-10-CM | POA: Diagnosis not present

## 2023-10-09 DIAGNOSIS — R1311 Dysphagia, oral phase: Secondary | ICD-10-CM | POA: Diagnosis not present

## 2023-10-11 DIAGNOSIS — R41841 Cognitive communication deficit: Secondary | ICD-10-CM | POA: Diagnosis not present

## 2023-10-11 DIAGNOSIS — I69991 Dysphagia following unspecified cerebrovascular disease: Secondary | ICD-10-CM | POA: Diagnosis not present

## 2023-10-11 DIAGNOSIS — R2689 Other abnormalities of gait and mobility: Secondary | ICD-10-CM | POA: Diagnosis not present

## 2023-10-11 DIAGNOSIS — F29 Unspecified psychosis not due to a substance or known physiological condition: Secondary | ICD-10-CM | POA: Diagnosis not present

## 2023-10-11 DIAGNOSIS — R1313 Dysphagia, pharyngeal phase: Secondary | ICD-10-CM | POA: Diagnosis not present

## 2023-10-11 DIAGNOSIS — M6281 Muscle weakness (generalized): Secondary | ICD-10-CM | POA: Diagnosis not present

## 2023-10-11 DIAGNOSIS — R1311 Dysphagia, oral phase: Secondary | ICD-10-CM | POA: Diagnosis not present

## 2023-10-12 DIAGNOSIS — R1311 Dysphagia, oral phase: Secondary | ICD-10-CM | POA: Diagnosis not present

## 2023-10-12 DIAGNOSIS — M6281 Muscle weakness (generalized): Secondary | ICD-10-CM | POA: Diagnosis not present

## 2023-10-12 DIAGNOSIS — R1313 Dysphagia, pharyngeal phase: Secondary | ICD-10-CM | POA: Diagnosis not present

## 2023-10-12 DIAGNOSIS — I69991 Dysphagia following unspecified cerebrovascular disease: Secondary | ICD-10-CM | POA: Diagnosis not present

## 2023-10-12 DIAGNOSIS — R41841 Cognitive communication deficit: Secondary | ICD-10-CM | POA: Diagnosis not present

## 2023-10-12 DIAGNOSIS — R2689 Other abnormalities of gait and mobility: Secondary | ICD-10-CM | POA: Diagnosis not present

## 2023-10-12 DIAGNOSIS — F29 Unspecified psychosis not due to a substance or known physiological condition: Secondary | ICD-10-CM | POA: Diagnosis not present

## 2023-10-13 ENCOUNTER — Other Ambulatory Visit: Payer: Self-pay

## 2023-10-13 ENCOUNTER — Emergency Department (HOSPITAL_COMMUNITY): Payer: Medicare Other

## 2023-10-13 ENCOUNTER — Emergency Department (HOSPITAL_COMMUNITY)
Admission: EM | Admit: 2023-10-13 | Discharge: 2023-10-14 | Disposition: A | Discharge status: Skilled Nursing Facility | Payer: Medicare Other | Attending: Emergency Medicine | Admitting: Emergency Medicine

## 2023-10-13 DIAGNOSIS — Z043 Encounter for examination and observation following other accident: Secondary | ICD-10-CM | POA: Diagnosis not present

## 2023-10-13 DIAGNOSIS — F29 Unspecified psychosis not due to a substance or known physiological condition: Secondary | ICD-10-CM | POA: Diagnosis not present

## 2023-10-13 DIAGNOSIS — M160 Bilateral primary osteoarthritis of hip: Secondary | ICD-10-CM | POA: Diagnosis not present

## 2023-10-13 DIAGNOSIS — S0101XA Laceration without foreign body of scalp, initial encounter: Secondary | ICD-10-CM | POA: Diagnosis not present

## 2023-10-13 DIAGNOSIS — G319 Degenerative disease of nervous system, unspecified: Secondary | ICD-10-CM | POA: Diagnosis not present

## 2023-10-13 DIAGNOSIS — I517 Cardiomegaly: Secondary | ICD-10-CM | POA: Diagnosis not present

## 2023-10-13 DIAGNOSIS — M6281 Muscle weakness (generalized): Secondary | ICD-10-CM | POA: Diagnosis not present

## 2023-10-13 DIAGNOSIS — I700 Atherosclerosis of aorta: Secondary | ICD-10-CM | POA: Diagnosis not present

## 2023-10-13 DIAGNOSIS — I10 Essential (primary) hypertension: Secondary | ICD-10-CM | POA: Diagnosis not present

## 2023-10-13 DIAGNOSIS — S0990XA Unspecified injury of head, initial encounter: Secondary | ICD-10-CM | POA: Diagnosis not present

## 2023-10-13 DIAGNOSIS — S06310A Contusion and laceration of right cerebrum without loss of consciousness, initial encounter: Secondary | ICD-10-CM | POA: Diagnosis not present

## 2023-10-13 DIAGNOSIS — W19XXXA Unspecified fall, initial encounter: Secondary | ICD-10-CM | POA: Diagnosis not present

## 2023-10-13 DIAGNOSIS — G3184 Mild cognitive impairment, so stated: Secondary | ICD-10-CM | POA: Diagnosis not present

## 2023-10-13 DIAGNOSIS — W01198A Fall on same level from slipping, tripping and stumbling with subsequent striking against other object, initial encounter: Secondary | ICD-10-CM | POA: Diagnosis not present

## 2023-10-13 DIAGNOSIS — Z981 Arthrodesis status: Secondary | ICD-10-CM | POA: Diagnosis not present

## 2023-10-13 DIAGNOSIS — S199XXA Unspecified injury of neck, initial encounter: Secondary | ICD-10-CM | POA: Diagnosis not present

## 2023-10-13 DIAGNOSIS — R2689 Other abnormalities of gait and mobility: Secondary | ICD-10-CM | POA: Diagnosis not present

## 2023-10-13 DIAGNOSIS — R296 Repeated falls: Secondary | ICD-10-CM | POA: Diagnosis not present

## 2023-10-13 DIAGNOSIS — F411 Generalized anxiety disorder: Secondary | ICD-10-CM | POA: Diagnosis not present

## 2023-10-13 DIAGNOSIS — R41841 Cognitive communication deficit: Secondary | ICD-10-CM | POA: Diagnosis not present

## 2023-10-13 DIAGNOSIS — I69991 Dysphagia following unspecified cerebrovascular disease: Secondary | ICD-10-CM | POA: Diagnosis not present

## 2023-10-13 DIAGNOSIS — F331 Major depressive disorder, recurrent, moderate: Secondary | ICD-10-CM | POA: Diagnosis not present

## 2023-10-13 DIAGNOSIS — R1313 Dysphagia, pharyngeal phase: Secondary | ICD-10-CM | POA: Diagnosis not present

## 2023-10-13 DIAGNOSIS — R918 Other nonspecific abnormal finding of lung field: Secondary | ICD-10-CM | POA: Diagnosis not present

## 2023-10-13 DIAGNOSIS — R58 Hemorrhage, not elsewhere classified: Secondary | ICD-10-CM | POA: Diagnosis not present

## 2023-10-13 DIAGNOSIS — R1311 Dysphagia, oral phase: Secondary | ICD-10-CM | POA: Diagnosis not present

## 2023-10-13 MED ORDER — LIDOCAINE-EPINEPHRINE (PF) 2 %-1:200000 IJ SOLN
20.0000 mL | Freq: Once | INTRAMUSCULAR | Status: AC
  Administered 2023-10-13: 20 mL
  Filled 2023-10-13: qty 20

## 2023-10-13 NOTE — ED Notes (Signed)
 Patient ambulated with a walker steady. However patient states she has impaired vision and it is difficult to see out of both eyes. PA-Browning informed

## 2023-10-13 NOTE — Discharge Instructions (Addendum)
 Your scans and x-rays looked good.  You need to have your staples removed in 7 days.

## 2023-10-13 NOTE — ED Triage Notes (Signed)
 Patient BIBA from [facility] for unwitnessed fall. Has hemotoma to posterior R head with laceration. Bleeding controlled. Refused c-collar. Baseline confusion (BJ:YNWGNFA). A&Ox2. Facility denies LOC and is not on a thinner. EMS noted unequal pupils on arrival L (4) > R (3). Unknown if this is baseline. BGL-140

## 2023-10-13 NOTE — ED Provider Notes (Incomplete)
 [facility] Emergency Department Provider Note MRN:  [id number]  Arrival date & time: 10/13/23     Chief Complaint   Fall   History of Present Illness   [name] is a 89 y.o. year-old female presents to the ED with chief complaint of fall.  States that she stepped backwards and tripped and fell.  She hit her head.  She denies neck pain, chest pain, abdominal pain, hip pain.  She denies any new injuries to her extremities, but has old skin tears.  History provided by patient. {RB interpreter (Optional):27221}  Review of Systems  Pertinent positive and negative review of systems noted in HPI.    Physical Exam   Vitals:   10/13/23 2155  BP: 127/88  Pulse: 88  Resp: 15  Temp: 98.7 F (37.1 C)  SpO2: 98%    CONSTITUTIONAL:  non toxic-appearing, NAD, scalp hematoma with lac on the right posterior NEURO:  Alert and oriented x 3, CN 3-12 grossly intact EYES:  eyes equal and reactive ENT/NECK:  Supple, no stridor  CARDIO:  normal rate, regular rhythm, appears well-perfused  PULM:  No respiratory distress, CTAB GI/GU:  non-distended,  MSK/SPINE:  No gross deformities, no edema, moves all extremities  SKIN:  no rash, atraumatic   *Additional and/or pertinent findings included in MDM below  Diagnostic and Interventional Summary    EKG Interpretation Date/Time:    Ventricular Rate:    PR Interval:    QRS Duration:    QT Interval:    QTC Calculation:   R Axis:      Text Interpretation:         Labs Reviewed - No data to display  CT HEAD WO CONTRAST ( )    (Results Pending)  CT Cervical Spine Wo Contrast    (Results Pending)  DG Chest 1 View    (Results Pending)  DG Pelvis 1-2 Views    (Results Pending)    Medications - No data to display   Procedures  /  Critical Care Procedures  ED Course and Medical Decision Making  I have reviewed the triage vital signs, the nursing notes, and pertinent available records from the  EMR.  Social Determinants Affecting Complexity of Care: Patient has no clinically significant social determinants affecting this chief complaint.. {rbsocialsolutions:27068}  ED Course: Clinical Course as of 10/13/23 2346  Tue Oct 13, 2023  2346 Patient able to ambulate with walker.  Walked with RN. [RB]  2346 CT HEAD WO CONTRAST ( ) No obvious ICH or skull fracture. [RB]    Clinical Course User Index [RB] Roxy Horseman, PA-C    Medical Decision Making Patient here after a fall.  States she fell over backwards and hit her head.  She is not anticoagulated.  She has a 2 cm scalp laceration.  Old skin tears to left forearm.  No other new injuries noted.  Moves all extremities.    CTs reassuring.  Laceration repaired.    Amount and/or Complexity of Data Reviewed Radiology: ordered. Decision-making details documented in ED Course.  Risk Prescription drug management.      {rbcpddx (Optional):29772:::1} {rbabdddx (Optional):29773:s::1}  Consultants: No consultations were needed in caring for this patient.   Treatment and Plan: I considered admission due to patient's initial presentation, but after considering the examination and diagnostic results, patient will not require admission and can be discharged with outpatient follow-up.  {rbattending:27073}  Final Clinical Impressions(s) / ED Diagnoses  No diagnosis found.  ED Discharge Orders  None         Discharge Instructions Discussed with and Provided to Patient:   Discharge Instructions   None

## 2023-10-13 NOTE — ED Provider Notes (Signed)
 [facility] Emergency Department Provider Note MRN:  [id number]  Arrival date & time: 10/14/23     Chief Complaint   Fall   History of Present Illness   [name] is a 89 y.o. year-old female presents to the ED with chief complaint of fall.  States that she stepped backwards and tripped and fell.  She hit her head.  She denies neck pain, chest pain, abdominal pain, hip pain.  She denies any new injuries to her extremities, but has old skin tears.  History provided by patient.   Review of Systems  Pertinent positive and negative review of systems noted in HPI.    Physical Exam   Vitals:   10/13/23 2155  BP: 127/88  Pulse: 88  Resp: 15  Temp: 98.7 F (37.1 C)  SpO2: 98%    CONSTITUTIONAL:  non toxic-appearing, NAD, scalp hematoma with lac on the right posterior NEURO:  Alert and oriented x 3, CN 3-12 grossly intact EYES:  eyes equal and reactive ENT/NECK:  Supple, no stridor  CARDIO:  normal rate, regular rhythm, appears well-perfused  PULM:  No respiratory distress, CTAB GI/GU:  non-distended,  MSK/SPINE:  No gross deformities, no edema, moves all extremities  SKIN:  no rash, atraumatic   *Additional and/or pertinent findings included in MDM below  Diagnostic and Interventional Summary    EKG Interpretation Date/Time:    Ventricular Rate:    PR Interval:    QRS Duration:    QT Interval:    QTC Calculation:   R Axis:      Text Interpretation:         Labs Reviewed - No data to display  CT HEAD WO CONTRAST ( )  Final Result    CT Cervical Spine Wo Contrast  Final Result    DG Chest 1 View  Final Result    DG Pelvis 1-2 Views  Final Result      Medications  lidocaine-EPINEPHrine (XYLOCAINE W/EPI) 2 %-1:200000 (PF) injection 20 mL (20 mLs Infiltration Given 10/13/23 2318)     Procedures  /  Critical Care .Laceration Repair  Date/Time: 10/14/2023 12:16 AM  Performed by: Roxy Horseman, PA-C Authorized by:  Roxy Horseman, PA-C   Consent:    Consent obtained:  Verbal   Consent given by:  Patient   Risks, benefits, and alternatives were discussed: yes     Risks discussed:  Pain, poor cosmetic result, poor wound healing, infection and need for additional repair   Alternatives discussed:  No treatment Universal protocol:    Procedure explained and questions answered to patient or proxy's satisfaction: yes     Relevant documents present and verified: yes     Test results available: yes     Imaging studies available: yes     Required blood products, implants, devices, and special equipment available: yes     Site/side marked: yes     Immediately prior to procedure, a time out was called: yes     Patient identity confirmed:  Verbally with patient Anesthesia:    Anesthesia method:  Local infiltration   Local anesthetic:  Lidocaine 1% WITH epi Laceration details:    Location:  Scalp   Length (cm):  2 Exploration:    Imaging outcome: foreign body not noted   Treatment:    Area cleansed with:  Saline   Amount of cleaning:  Standard Skin repair:    Repair method:  Staples   Number of staples:  2 Approximation:  Approximation:  Close   ED Course and Medical Decision Making  I have reviewed the triage vital signs, the nursing notes, and pertinent available records from the EMR.  Social Determinants Affecting Complexity of Care: Patient has no clinically significant social determinants affecting this chief complaint..   ED Course: Clinical Course as of 10/14/23 0015  Tue Oct 13, 2023  2346 Patient able to ambulate with walker.  Walked with RN. [RB]  2346 CT HEAD WO CONTRAST ( ) No obvious ICH or skull fracture. [RB]    Clinical Course User Index [RB] Roxy Horseman, PA-C    Medical Decision Making Patient here after a fall.  States she fell over backwards and hit her head.  She is not anticoagulated.  She has a 2 cm scalp laceration.  Old skin tears to left forearm.  No  other new injuries noted.  Moves all extremities.    CTs reassuring.  Laceration repaired.    Amount and/or Complexity of Data Reviewed Radiology: ordered. Decision-making details documented in ED Course.  Risk Prescription drug management.         Consultants: No consultations were needed in caring for this patient.   Treatment and Plan: I considered admission due to patient's initial presentation, but after considering the examination and diagnostic results, patient will not require admission and can be discharged with outpatient follow-up.    Final Clinical Impressions(s) / ED Diagnoses     ICD-10-CM   1. Fall, initial encounter  W19.XXXA     2. Injury of head, initial encounter  S09.90XA       ED Discharge Orders     None         Discharge Instructions Discussed with and Provided to Patient:     Discharge Instructions      Your scans and x-rays looked good.  You need to have your staples removed in 7 days.       Roxy Horseman, PA-C 10/14/23 0017    Mesner, Barbara Cower, MD 10/14/23 [phone]

## 2023-10-14 DIAGNOSIS — R2689 Other abnormalities of gait and mobility: Secondary | ICD-10-CM | POA: Diagnosis not present

## 2023-10-14 DIAGNOSIS — F29 Unspecified psychosis not due to a substance or known physiological condition: Secondary | ICD-10-CM | POA: Diagnosis not present

## 2023-10-14 DIAGNOSIS — R1313 Dysphagia, pharyngeal phase: Secondary | ICD-10-CM | POA: Diagnosis not present

## 2023-10-14 DIAGNOSIS — R296 Repeated falls: Secondary | ICD-10-CM | POA: Diagnosis not present

## 2023-10-14 DIAGNOSIS — R1311 Dysphagia, oral phase: Secondary | ICD-10-CM | POA: Diagnosis not present

## 2023-10-14 DIAGNOSIS — R41841 Cognitive communication deficit: Secondary | ICD-10-CM | POA: Diagnosis not present

## 2023-10-14 DIAGNOSIS — R4689 Other symptoms and signs involving appearance and behavior: Secondary | ICD-10-CM | POA: Diagnosis not present

## 2023-10-14 DIAGNOSIS — M6281 Muscle weakness (generalized): Secondary | ICD-10-CM | POA: Diagnosis not present

## 2023-10-14 DIAGNOSIS — S0083XA Contusion of other part of head, initial encounter: Secondary | ICD-10-CM | POA: Diagnosis not present

## 2023-10-14 DIAGNOSIS — I69991 Dysphagia following unspecified cerebrovascular disease: Secondary | ICD-10-CM | POA: Diagnosis not present

## 2023-10-14 NOTE — ED Notes (Signed)
 [facility] called for transport back to [facility]

## 2023-10-15 ENCOUNTER — Emergency Department (HOSPITAL_COMMUNITY)
Admission: EM | Admit: 2023-10-15 | Discharge: 2023-10-15 | Disposition: A | Discharge status: Home / Self Care | Payer: Medicare Other | Attending: Emergency Medicine | Admitting: Emergency Medicine

## 2023-10-15 ENCOUNTER — Other Ambulatory Visit: Payer: Self-pay

## 2023-10-15 ENCOUNTER — Encounter (HOSPITAL_COMMUNITY): Payer: Self-pay | Admitting: Emergency Medicine

## 2023-10-15 DIAGNOSIS — I10 Essential (primary) hypertension: Secondary | ICD-10-CM | POA: Diagnosis not present

## 2023-10-15 DIAGNOSIS — R41841 Cognitive communication deficit: Secondary | ICD-10-CM | POA: Diagnosis not present

## 2023-10-15 DIAGNOSIS — R1311 Dysphagia, oral phase: Secondary | ICD-10-CM | POA: Diagnosis not present

## 2023-10-15 DIAGNOSIS — G629 Polyneuropathy, unspecified: Secondary | ICD-10-CM | POA: Diagnosis not present

## 2023-10-15 DIAGNOSIS — R1313 Dysphagia, pharyngeal phase: Secondary | ICD-10-CM | POA: Diagnosis not present

## 2023-10-15 DIAGNOSIS — F411 Generalized anxiety disorder: Secondary | ICD-10-CM | POA: Diagnosis not present

## 2023-10-15 DIAGNOSIS — W010XXA Fall on same level from slipping, tripping and stumbling without subsequent striking against object, initial encounter: Secondary | ICD-10-CM | POA: Diagnosis not present

## 2023-10-15 DIAGNOSIS — R2689 Other abnormalities of gait and mobility: Secondary | ICD-10-CM | POA: Diagnosis not present

## 2023-10-15 DIAGNOSIS — G3184 Mild cognitive impairment, so stated: Secondary | ICD-10-CM | POA: Diagnosis not present

## 2023-10-15 DIAGNOSIS — M6281 Muscle weakness (generalized): Secondary | ICD-10-CM | POA: Diagnosis not present

## 2023-10-15 DIAGNOSIS — W19XXXA Unspecified fall, initial encounter: Secondary | ICD-10-CM

## 2023-10-15 DIAGNOSIS — S0083XA Contusion of other part of head, initial encounter: Secondary | ICD-10-CM | POA: Diagnosis not present

## 2023-10-15 DIAGNOSIS — Z79899 Other long term (current) drug therapy: Secondary | ICD-10-CM | POA: Diagnosis not present

## 2023-10-15 DIAGNOSIS — F29 Unspecified psychosis not due to a substance or known physiological condition: Secondary | ICD-10-CM | POA: Diagnosis not present

## 2023-10-15 DIAGNOSIS — Y92129 Unspecified place in nursing home as the place of occurrence of the external cause: Secondary | ICD-10-CM | POA: Diagnosis not present

## 2023-10-15 DIAGNOSIS — I69991 Dysphagia following unspecified cerebrovascular disease: Secondary | ICD-10-CM | POA: Diagnosis not present

## 2023-10-15 MED ORDER — ACETAMINOPHEN 325 MG PO TABS
650.0000 mg | ORAL_TABLET | Freq: Once | ORAL | Status: AC
  Administered 2023-10-15: 650 mg via ORAL
  Filled 2023-10-15: qty 2

## 2023-10-15 NOTE — ED Provider Notes (Signed)
 [facility] EMERGENCY DEPARTMENT AT [facility] Provider Note  CSN: [id number] Arrival date & time: 10/15/23 0030  Chief Complaint(s) Fall  HPI [name] is a 89 y.o. female with a past medical history listed below including neurocognitive disorders frequent falls who lives in a skilled nursing facility is here for an unwitnessed fall resulting in hematoma to the right forehead.  Unknown loss of consciousness but staff reportedly heard her fall and yell out.  Patient is at her baseline mental status but more calm than usual.  She was here 2 days ago for unwitnessed fall resulting in a laceration to the right scalp.  Staples are in place.  Patient has no physical complaints other than pain associated to hematoma.  The history is provided by the patient.    Past Medical History Past Medical History:  Diagnosis Date   Arthritis    Osteoarthritis   Depression    GERD (gastroesophageal reflux disease)    Hyperlipidemia    Osteopenia    Patient Active Problem List   Diagnosis Date Noted   Psychosis (HCC) 05/19/2023   Adjustment disorder with depressed mood 05/19/2023   Mild neurocognitive disorder 05/19/2023   HLD (hyperlipidemia) 04/29/2023   GERD without esophagitis 03/08/2021   Chronic idiopathic constipation 03/08/2021   Moderate episode of recurrent major depressive disorder (HCC) 08/15/2019   Generalized anxiety disorder 08/15/2019   Recurrent major depressive disorder, in partial remission (HCC) 03/24/2019   Generalized osteoarthritis of multiple sites 03/24/2019   Need for influenza vaccination 04/01/2018   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 Authorizing Provider  ADVIL 200 MG CAPS Take 200 mg by mouth every 8 (eight) hours as needed (for pain or headaches).    [provider]  BIOTIN PO Take 1 capsule by mouth daily.    [provider]  brimonidine (ALPHAGAN) 0.2 % ophthalmic solution Place 1 drop into the right eye 2 (two) times daily. 12/02/22     dorzolamide-timolol (COSOPT) 2-0.5 % ophthalmic solution Place 1 drop into the right eye 2 (two) times daily. 12/02/22     gabapentin (NEURONTIN) 300 MG capsule Take 1 capsule (300 mg total) by mouth 2 (two) times daily. Patient taking differently: Take 300-600 mg by mouth See admin instructions. Take 300 mg by mouth in the morning and 300-600 mg by mouth at bedtime. 04/27/23   Venita Sheffield, MD  lisinopril (ZESTRIL) 10 MG tablet Take 1 tablet (10 mg total) by mouth daily. 04/29/23   Venita Sheffield, MD  Netarsudil-Latanoprost (ROCKLATAN) 0.02-0.005 % SOLN Place 1 drop into the right eye every evening. 12/02/22     omeprazole (PRILOSEC) 20 MG capsule Take 1 capsule (20 mg total) by mouth daily. 04/27/23   Venita Sheffield, MD  QUEtiapine (SEROQUEL XR) 50 MG TB24 24 hr tablet Take 1 tablet (50 mg total) by mouth daily. 04/29/23   Venita Sheffield, MD  sertraline (ZOLOFT) 100 MG tablet Take 1 tablet (100 mg total) by mouth at bedtime. 01/27/23   Frederica Kuster, MD  Allergies Penicillins  Review of Systems Review of Systems As noted in HPI  Physical Exam Vital Signs  I have reviewed the triage vital signs BP (!) 159/96   Pulse 78   Temp 97.7 F (36.5 C) (Oral)   Resp 16   SpO2 100%   Physical Exam Constitutional:      General: She is not in acute distress.    Appearance:  She is well-developed. She is not diaphoretic.  HENT:     Head: Normocephalic and atraumatic.      Right Ear: External ear normal.     Left Ear: External ear normal.     Nose: Nose normal.  Eyes:     General: No scleral icterus.       Right eye: No discharge.        Left eye: No discharge.     Conjunctiva/sclera: Conjunctivae normal.     Pupils: Pupils are equal, round, and reactive to light.     Comments: S/p Bilateral cataract sx.   Cardiovascular:     Rate and Rhythm: Normal rate and regular rhythm.     Pulses:          Radial pulses are 2+ on the right side and 2+ on the left side.       Dorsalis pedis pulses are 2+ on the right side and 2+ on the left side.     Heart sounds: Normal heart sounds. No murmur heard.    No friction rub. No gallop.  Pulmonary:     Effort: Pulmonary effort is normal. No respiratory distress.     Breath sounds: Normal breath sounds. No stridor. No wheezing.  Abdominal:     General: There is no distension.     Palpations: Abdomen is soft.     Tenderness: There is no abdominal tenderness.  Musculoskeletal:        General: No tenderness.     Cervical back: Normal range of motion and neck supple. No bony tenderness.     Thoracic back: No bony tenderness.     Lumbar back: No bony tenderness.     Comments: Clavicles stable. Chest stable to AP/Lat compression. Pelvis stable to Lat compression. No obvious extremity deformity. No chest or abdominal wall contusion.  Skin:    General: Skin is warm and dry.     Findings: No erythema or rash.  Neurological:     Mental Status: She is alert. Mental status is at baseline.     Comments: Follows commands and moving all extremities     ED Results and Treatments Labs (all labs ordered are listed, but only abnormal results are displayed) Labs Reviewed - No data to display                                                                                                                       EKG  EKG  Interpretation Date/Time:    Ventricular Rate:    PR Interval:    QRS Duration:  QT Interval:    QTC Calculation:   R Axis:      Text Interpretation:         Radiology No results found.  Medications Ordered in ED Medications  acetaminophen (TYLENOL) tablet 650 mg (has no administration in time range)   Procedures Procedures  (including critical care time) Medical Decision Making / ED Course   Medical Decision Making   Unwitnessed mechanical fall.  Hematoma to the right head.  Recent laceration closed with 2 staples in place.  No new lacerations requiring closure.  No other injuries noted on exam.There was a concern for unequal pupils..  The pupils are equal and reactive.  She is status post cataract surgery.   Patient has a MOST form stating comfort care.  No IV fluids, antibiotics, etc. She is requesting tylenol for pain.  Attempted to call son [name]) and SW (Mrs. Katrinka Blazing) but no answer. I spoke with facility RN to update.  I don't feel that imaging is necessary at this time as she does not show obvious signs of serious ICH and it would not change disposition nor management of patient per her MOST form.     Final Clinical Impression(s) / ED Diagnoses Final diagnoses:  Fall at nursing home, initial encounter  Contusion of face, initial encounter   The patient appears reasonably screened and/or stabilized for discharge and I doubt any other medical condition or other [facility] requiring further screening, evaluation, or treatment in the ED at this time. I have discussed the findings, Dx and Tx plan with the patient/family who expressed understanding and agree(s) with the plan. Discharge instructions discussed at length. The patient/family was given strict return precautions who verbalized understanding of the instructions. No further questions at time of discharge.  Disposition: Discharge  Condition: Good  ED Discharge Orders     None         Follow  Up: Venita Sheffield, MD [address] [place] [address] [phone]  Call  as needed     This chart was dictated using voice recognition software.  Despite best efforts to proofread,  errors can occur which can change the documentation meaning.    Nira Conn, MD 10/15/23 [phone]

## 2023-10-15 NOTE — ED Triage Notes (Signed)
 Patient bib [facility] from [facility] for unwitnessed fall. Patient reports that she tripped in fall. Hematoma to top of head and laceration to temporal area. Ems reports left pupil larger than right and patient is calmer than her baseline. No blood thinners.   CBG 121 BP 120/90  HR 86  SPO2 96%

## 2023-10-15 NOTE — ED Notes (Signed)
 Pt noted to have hematoma to rt forehead. Pt responding appropriately.

## 2023-10-16 DIAGNOSIS — I69991 Dysphagia following unspecified cerebrovascular disease: Secondary | ICD-10-CM | POA: Diagnosis not present

## 2023-10-16 DIAGNOSIS — F331 Major depressive disorder, recurrent, moderate: Secondary | ICD-10-CM | POA: Diagnosis not present

## 2023-10-16 DIAGNOSIS — F29 Unspecified psychosis not due to a substance or known physiological condition: Secondary | ICD-10-CM | POA: Diagnosis not present

## 2023-10-16 DIAGNOSIS — F411 Generalized anxiety disorder: Secondary | ICD-10-CM | POA: Diagnosis not present

## 2023-10-16 DIAGNOSIS — R1313 Dysphagia, pharyngeal phase: Secondary | ICD-10-CM | POA: Diagnosis not present

## 2023-10-16 DIAGNOSIS — F4321 Adjustment disorder with depressed mood: Secondary | ICD-10-CM | POA: Diagnosis not present

## 2023-10-16 DIAGNOSIS — K219 Gastro-esophageal reflux disease without esophagitis: Secondary | ICD-10-CM | POA: Diagnosis not present

## 2023-10-16 DIAGNOSIS — R41841 Cognitive communication deficit: Secondary | ICD-10-CM | POA: Diagnosis not present

## 2023-10-16 DIAGNOSIS — S0990XA Unspecified injury of head, initial encounter: Secondary | ICD-10-CM | POA: Diagnosis not present

## 2023-10-16 DIAGNOSIS — M6281 Muscle weakness (generalized): Secondary | ICD-10-CM | POA: Diagnosis not present

## 2023-10-16 DIAGNOSIS — R1311 Dysphagia, oral phase: Secondary | ICD-10-CM | POA: Diagnosis not present

## 2023-10-16 DIAGNOSIS — W19XXXA Unspecified fall, initial encounter: Secondary | ICD-10-CM | POA: Diagnosis not present

## 2023-10-16 DIAGNOSIS — T887XXA Unspecified adverse effect of drug or medicament, initial encounter: Secondary | ICD-10-CM | POA: Diagnosis not present

## 2023-10-16 DIAGNOSIS — R2689 Other abnormalities of gait and mobility: Secondary | ICD-10-CM | POA: Diagnosis not present

## 2023-10-17 DIAGNOSIS — R296 Repeated falls: Secondary | ICD-10-CM | POA: Diagnosis not present

## 2023-10-18 DIAGNOSIS — F29 Unspecified psychosis not due to a substance or known physiological condition: Secondary | ICD-10-CM | POA: Diagnosis not present

## 2023-10-18 DIAGNOSIS — R1313 Dysphagia, pharyngeal phase: Secondary | ICD-10-CM | POA: Diagnosis not present

## 2023-10-18 DIAGNOSIS — M6281 Muscle weakness (generalized): Secondary | ICD-10-CM | POA: Diagnosis not present

## 2023-10-18 DIAGNOSIS — R2689 Other abnormalities of gait and mobility: Secondary | ICD-10-CM | POA: Diagnosis not present

## 2023-10-18 DIAGNOSIS — R1311 Dysphagia, oral phase: Secondary | ICD-10-CM | POA: Diagnosis not present

## 2023-10-18 DIAGNOSIS — R41841 Cognitive communication deficit: Secondary | ICD-10-CM | POA: Diagnosis not present

## 2023-10-18 DIAGNOSIS — I69991 Dysphagia following unspecified cerebrovascular disease: Secondary | ICD-10-CM | POA: Diagnosis not present

## 2023-10-19 DIAGNOSIS — M25511 Pain in right shoulder: Secondary | ICD-10-CM | POA: Diagnosis not present

## 2023-10-19 DIAGNOSIS — I69991 Dysphagia following unspecified cerebrovascular disease: Secondary | ICD-10-CM | POA: Diagnosis not present

## 2023-10-19 DIAGNOSIS — F29 Unspecified psychosis not due to a substance or known physiological condition: Secondary | ICD-10-CM | POA: Diagnosis not present

## 2023-10-19 DIAGNOSIS — R41841 Cognitive communication deficit: Secondary | ICD-10-CM | POA: Diagnosis not present

## 2023-10-19 DIAGNOSIS — F411 Generalized anxiety disorder: Secondary | ICD-10-CM | POA: Diagnosis not present

## 2023-10-19 DIAGNOSIS — M25521 Pain in right elbow: Secondary | ICD-10-CM | POA: Diagnosis not present

## 2023-10-19 DIAGNOSIS — M25531 Pain in right wrist: Secondary | ICD-10-CM | POA: Diagnosis not present

## 2023-10-19 DIAGNOSIS — R2689 Other abnormalities of gait and mobility: Secondary | ICD-10-CM | POA: Diagnosis not present

## 2023-10-19 DIAGNOSIS — I10 Essential (primary) hypertension: Secondary | ICD-10-CM | POA: Diagnosis not present

## 2023-10-19 DIAGNOSIS — G629 Polyneuropathy, unspecified: Secondary | ICD-10-CM | POA: Diagnosis not present

## 2023-10-19 DIAGNOSIS — R1311 Dysphagia, oral phase: Secondary | ICD-10-CM | POA: Diagnosis not present

## 2023-10-19 DIAGNOSIS — M79641 Pain in right hand: Secondary | ICD-10-CM | POA: Diagnosis not present

## 2023-10-19 DIAGNOSIS — R1313 Dysphagia, pharyngeal phase: Secondary | ICD-10-CM | POA: Diagnosis not present

## 2023-10-19 DIAGNOSIS — M6281 Muscle weakness (generalized): Secondary | ICD-10-CM | POA: Diagnosis not present

## 2023-10-19 DIAGNOSIS — G3184 Mild cognitive impairment, so stated: Secondary | ICD-10-CM | POA: Diagnosis not present

## 2023-10-20 DIAGNOSIS — R1311 Dysphagia, oral phase: Secondary | ICD-10-CM | POA: Diagnosis not present

## 2023-10-20 DIAGNOSIS — F411 Generalized anxiety disorder: Secondary | ICD-10-CM | POA: Diagnosis not present

## 2023-10-20 DIAGNOSIS — I69991 Dysphagia following unspecified cerebrovascular disease: Secondary | ICD-10-CM | POA: Diagnosis not present

## 2023-10-20 DIAGNOSIS — R1313 Dysphagia, pharyngeal phase: Secondary | ICD-10-CM | POA: Diagnosis not present

## 2023-10-20 DIAGNOSIS — G3184 Mild cognitive impairment, so stated: Secondary | ICD-10-CM | POA: Diagnosis not present

## 2023-10-20 DIAGNOSIS — R2689 Other abnormalities of gait and mobility: Secondary | ICD-10-CM | POA: Diagnosis not present

## 2023-10-20 DIAGNOSIS — M6281 Muscle weakness (generalized): Secondary | ICD-10-CM | POA: Diagnosis not present

## 2023-10-20 DIAGNOSIS — R41841 Cognitive communication deficit: Secondary | ICD-10-CM | POA: Diagnosis not present

## 2023-10-20 DIAGNOSIS — M79601 Pain in right arm: Secondary | ICD-10-CM | POA: Diagnosis not present

## 2023-10-20 DIAGNOSIS — I10 Essential (primary) hypertension: Secondary | ICD-10-CM | POA: Diagnosis not present

## 2023-10-20 DIAGNOSIS — F29 Unspecified psychosis not due to a substance or known physiological condition: Secondary | ICD-10-CM | POA: Diagnosis not present

## 2023-10-21 DIAGNOSIS — M6281 Muscle weakness (generalized): Secondary | ICD-10-CM | POA: Diagnosis not present

## 2023-10-21 DIAGNOSIS — F29 Unspecified psychosis not due to a substance or known physiological condition: Secondary | ICD-10-CM | POA: Diagnosis not present

## 2023-10-21 DIAGNOSIS — R2689 Other abnormalities of gait and mobility: Secondary | ICD-10-CM | POA: Diagnosis not present

## 2023-10-21 DIAGNOSIS — R1313 Dysphagia, pharyngeal phase: Secondary | ICD-10-CM | POA: Diagnosis not present

## 2023-10-21 DIAGNOSIS — R1311 Dysphagia, oral phase: Secondary | ICD-10-CM | POA: Diagnosis not present

## 2023-10-21 DIAGNOSIS — I69991 Dysphagia following unspecified cerebrovascular disease: Secondary | ICD-10-CM | POA: Diagnosis not present

## 2023-10-21 DIAGNOSIS — R41841 Cognitive communication deficit: Secondary | ICD-10-CM | POA: Diagnosis not present

## 2023-10-22 DIAGNOSIS — E785 Hyperlipidemia, unspecified: Secondary | ICD-10-CM | POA: Diagnosis not present

## 2023-10-22 DIAGNOSIS — K219 Gastro-esophageal reflux disease without esophagitis: Secondary | ICD-10-CM | POA: Diagnosis not present

## 2023-10-22 DIAGNOSIS — R296 Repeated falls: Secondary | ICD-10-CM | POA: Diagnosis not present

## 2023-10-22 DIAGNOSIS — L03113 Cellulitis of right upper limb: Secondary | ICD-10-CM | POA: Diagnosis not present

## 2023-10-22 DIAGNOSIS — F331 Major depressive disorder, recurrent, moderate: Secondary | ICD-10-CM | POA: Diagnosis not present

## 2023-10-22 DIAGNOSIS — F411 Generalized anxiety disorder: Secondary | ICD-10-CM | POA: Diagnosis not present

## 2023-10-23 DIAGNOSIS — R41841 Cognitive communication deficit: Secondary | ICD-10-CM | POA: Diagnosis not present

## 2023-10-23 DIAGNOSIS — I69991 Dysphagia following unspecified cerebrovascular disease: Secondary | ICD-10-CM | POA: Diagnosis not present

## 2023-10-23 DIAGNOSIS — R1311 Dysphagia, oral phase: Secondary | ICD-10-CM | POA: Diagnosis not present

## 2023-10-23 DIAGNOSIS — M6281 Muscle weakness (generalized): Secondary | ICD-10-CM | POA: Diagnosis not present

## 2023-10-23 DIAGNOSIS — M79601 Pain in right arm: Secondary | ICD-10-CM | POA: Diagnosis not present

## 2023-10-23 DIAGNOSIS — F29 Unspecified psychosis not due to a substance or known physiological condition: Secondary | ICD-10-CM | POA: Diagnosis not present

## 2023-10-23 DIAGNOSIS — R2689 Other abnormalities of gait and mobility: Secondary | ICD-10-CM | POA: Diagnosis not present

## 2023-10-23 DIAGNOSIS — R1313 Dysphagia, pharyngeal phase: Secondary | ICD-10-CM | POA: Diagnosis not present

## 2023-10-24 DIAGNOSIS — R4689 Other symptoms and signs involving appearance and behavior: Secondary | ICD-10-CM | POA: Diagnosis not present

## 2023-10-25 DIAGNOSIS — R1313 Dysphagia, pharyngeal phase: Secondary | ICD-10-CM | POA: Diagnosis not present

## 2023-10-25 DIAGNOSIS — M6281 Muscle weakness (generalized): Secondary | ICD-10-CM | POA: Diagnosis not present

## 2023-10-25 DIAGNOSIS — F29 Unspecified psychosis not due to a substance or known physiological condition: Secondary | ICD-10-CM | POA: Diagnosis not present

## 2023-10-25 DIAGNOSIS — R4689 Other symptoms and signs involving appearance and behavior: Secondary | ICD-10-CM | POA: Diagnosis not present

## 2023-10-25 DIAGNOSIS — R1311 Dysphagia, oral phase: Secondary | ICD-10-CM | POA: Diagnosis not present

## 2023-10-25 DIAGNOSIS — I69991 Dysphagia following unspecified cerebrovascular disease: Secondary | ICD-10-CM | POA: Diagnosis not present

## 2023-10-25 DIAGNOSIS — R2689 Other abnormalities of gait and mobility: Secondary | ICD-10-CM | POA: Diagnosis not present

## 2023-10-25 DIAGNOSIS — R41841 Cognitive communication deficit: Secondary | ICD-10-CM | POA: Diagnosis not present

## 2023-10-26 DIAGNOSIS — R1313 Dysphagia, pharyngeal phase: Secondary | ICD-10-CM | POA: Diagnosis not present

## 2023-10-26 DIAGNOSIS — R2689 Other abnormalities of gait and mobility: Secondary | ICD-10-CM | POA: Diagnosis not present

## 2023-10-26 DIAGNOSIS — I69991 Dysphagia following unspecified cerebrovascular disease: Secondary | ICD-10-CM | POA: Diagnosis not present

## 2023-10-26 DIAGNOSIS — F29 Unspecified psychosis not due to a substance or known physiological condition: Secondary | ICD-10-CM | POA: Diagnosis not present

## 2023-10-26 DIAGNOSIS — R1311 Dysphagia, oral phase: Secondary | ICD-10-CM | POA: Diagnosis not present

## 2023-10-26 DIAGNOSIS — M6281 Muscle weakness (generalized): Secondary | ICD-10-CM | POA: Diagnosis not present

## 2023-10-26 DIAGNOSIS — R41841 Cognitive communication deficit: Secondary | ICD-10-CM | POA: Diagnosis not present

## 2023-10-27 DIAGNOSIS — I10 Essential (primary) hypertension: Secondary | ICD-10-CM | POA: Diagnosis not present

## 2023-10-27 DIAGNOSIS — R296 Repeated falls: Secondary | ICD-10-CM | POA: Diagnosis not present

## 2023-10-27 DIAGNOSIS — S81811A Laceration without foreign body, right lower leg, initial encounter: Secondary | ICD-10-CM | POA: Diagnosis not present

## 2023-10-27 DIAGNOSIS — G3184 Mild cognitive impairment, so stated: Secondary | ICD-10-CM | POA: Diagnosis not present

## 2023-10-27 DIAGNOSIS — K219 Gastro-esophageal reflux disease without esophagitis: Secondary | ICD-10-CM | POA: Diagnosis not present

## 2023-10-27 DIAGNOSIS — M79601 Pain in right arm: Secondary | ICD-10-CM | POA: Diagnosis not present

## 2023-11-19 DIAGNOSIS — K219 Gastro-esophageal reflux disease without esophagitis: Secondary | ICD-10-CM | POA: Diagnosis not present

## 2023-11-19 DIAGNOSIS — G3184 Mild cognitive impairment, so stated: Secondary | ICD-10-CM | POA: Diagnosis not present

## 2023-11-19 DIAGNOSIS — I10 Essential (primary) hypertension: Secondary | ICD-10-CM | POA: Diagnosis not present

## 2023-11-19 DIAGNOSIS — F411 Generalized anxiety disorder: Secondary | ICD-10-CM | POA: Diagnosis not present

## 2023-11-24 DIAGNOSIS — I10 Essential (primary) hypertension: Secondary | ICD-10-CM | POA: Diagnosis not present

## 2023-11-24 DIAGNOSIS — G3184 Mild cognitive impairment, so stated: Secondary | ICD-10-CM | POA: Diagnosis not present

## 2023-11-24 DIAGNOSIS — F339 Major depressive disorder, recurrent, unspecified: Secondary | ICD-10-CM | POA: Diagnosis not present

## 2023-11-24 DIAGNOSIS — R059 Cough, unspecified: Secondary | ICD-10-CM | POA: Diagnosis not present

## 2023-11-24 DIAGNOSIS — R451 Restlessness and agitation: Secondary | ICD-10-CM | POA: Diagnosis not present

## 2023-11-24 DIAGNOSIS — F411 Generalized anxiety disorder: Secondary | ICD-10-CM | POA: Diagnosis not present

## 2023-12-03 DIAGNOSIS — R296 Repeated falls: Secondary | ICD-10-CM | POA: Diagnosis not present

## 2023-12-03 DIAGNOSIS — S81812A Laceration without foreign body, left lower leg, initial encounter: Secondary | ICD-10-CM | POA: Diagnosis not present

## 2023-12-17 DEATH — deceased

## 2024-01-20 ENCOUNTER — Telehealth: Payer: Self-pay | Admitting: Pharmacist

## 2024-01-20 DIAGNOSIS — Z79899 Other long term (current) drug therapy: Secondary | ICD-10-CM

## 2024-01-20 NOTE — Progress Notes (Signed)
 Pharmacy Quality Measure Review  This patient is appearing on a report for being at risk of failing the adherence measure for hypertension (ACEi/ARB) medications this calendar year.   Medication: Lisinopril  10 mg Last fill date: 11/10/23 for 30 day supply  Patient will most likely fail the measure. No fill history in Dr. Anson Basta. Also not sure who her Provider is. She may need re-attribution.  Geronimo Krabbe, PharmD, BCACP Clinical Pharmacist [phone]

## 2024-01-28 ENCOUNTER — Other Ambulatory Visit: Payer: Self-pay | Admitting: Family Medicine

## 2024-02-04 ENCOUNTER — Other Ambulatory Visit (HOSPITAL_COMMUNITY): Payer: Self-pay

## 2024-02-29 ENCOUNTER — Ambulatory Visit: Payer: Medicare Other | Admitting: Neurology
